# Patient Record
Sex: Male | Born: 1985 | Race: Black or African American | Hispanic: No | Marital: Single | Smoking: Former smoker
Health system: Southern US, Community
[De-identification: ages and names within clinical notes are randomized; demographics above are authoritative.]

## PROBLEM LIST (undated history)

## (undated) DIAGNOSIS — J42 Unspecified chronic bronchitis: Secondary | ICD-10-CM

## (undated) DIAGNOSIS — J449 Chronic obstructive pulmonary disease, unspecified: Secondary | ICD-10-CM

## (undated) DIAGNOSIS — J45909 Unspecified asthma, uncomplicated: Secondary | ICD-10-CM

## (undated) DIAGNOSIS — B4481 Allergic bronchopulmonary aspergillosis: Secondary | ICD-10-CM

---

## 2007-08-26 ENCOUNTER — Emergency Department: Payer: Self-pay | Admitting: Emergency Medicine

## 2016-11-15 ENCOUNTER — Emergency Department: Payer: Self-pay

## 2016-11-15 ENCOUNTER — Encounter: Payer: Self-pay | Admitting: Emergency Medicine

## 2016-11-15 DIAGNOSIS — Z87891 Personal history of nicotine dependence: Secondary | ICD-10-CM | POA: Insufficient documentation

## 2016-11-15 DIAGNOSIS — J410 Simple chronic bronchitis: Secondary | ICD-10-CM | POA: Insufficient documentation

## 2016-11-15 LAB — CBC
HCT: 48.7 % (ref 40.0–52.0)
Hemoglobin: 16.7 g/dL (ref 13.0–18.0)
MCH: 31.6 pg (ref 26.0–34.0)
MCHC: 34.4 g/dL (ref 32.0–36.0)
MCV: 92.1 fL (ref 80.0–100.0)
Platelets: 245 10*3/uL (ref 150–440)
RBC: 5.29 MIL/uL (ref 4.40–5.90)
RDW: 13.1 % (ref 11.5–14.5)
WBC: 7.7 10*3/uL (ref 3.8–10.6)

## 2016-11-15 LAB — BASIC METABOLIC PANEL
ANION GAP: 7 (ref 5–15)
BUN: 9 mg/dL (ref 6–20)
CALCIUM: 9.6 mg/dL (ref 8.9–10.3)
CO2: 30 mmol/L (ref 22–32)
Chloride: 102 mmol/L (ref 101–111)
Creatinine, Ser: 0.98 mg/dL (ref 0.61–1.24)
GLUCOSE: 114 mg/dL — AB (ref 65–99)
Potassium: 4 mmol/L (ref 3.5–5.1)
SODIUM: 139 mmol/L (ref 135–145)

## 2016-11-15 LAB — TROPONIN I

## 2016-11-15 NOTE — ED Triage Notes (Signed)
Pt. States ongoing chest pain and cough for the past month.  Pt. States increased coughing today with non-productive cough.  Pt. In no distress at this time.  Pt. States using rescue inhaler, but ineffective today.

## 2016-11-16 ENCOUNTER — Emergency Department
Admission: EM | Admit: 2016-11-16 | Discharge: 2016-11-16 | Disposition: A | Discharge status: Home / Self Care | Payer: Self-pay | Attending: Emergency Medicine | Admitting: Emergency Medicine

## 2016-11-16 DIAGNOSIS — J410 Simple chronic bronchitis: Secondary | ICD-10-CM

## 2016-11-16 MED ORDER — IPRATROPIUM-ALBUTEROL 0.5-2.5 (3) MG/3ML IN SOLN
3.0000 mL | Freq: Once | RESPIRATORY_TRACT | Status: AC
  Administered 2016-11-16: 3 mL via RESPIRATORY_TRACT
  Filled 2016-11-16: qty 3

## 2016-11-16 MED ORDER — ALBUTEROL SULFATE HFA 108 (90 BASE) MCG/ACT IN AERS: INHALATION_SPRAY | RESPIRATORY_TRACT | 1 refills | Status: DC

## 2016-11-16 NOTE — Discharge Instructions (Signed)
As we discussed, your workup today was reassuring.  Please read through the information about chronic bronchitis and use the prescribed inhaler as per the recommendations on the label.  You made a smart move by stopping smoking, and this will help you more than anything else.  Please do not go back to using tobacco!!  Please return immediately to the Emergency Department if you develop any new or worsening symptoms that concern you.

## 2016-11-16 NOTE — ED Provider Notes (Signed)
[facility] Emergency Department Provider Note  ____________________________________________   First MD Initiated Contact with Patient 11/16/16 0031     (approximate)  I have reviewed the triage vital signs and the nursing notes.   HISTORY  Chief Complaint Chest Pain (Pt. states chest pain for 3 months.) and Cough (Pt. states cough for past 3 months.)    HPI Shannon Knapp is a 31 y.o. male who is a cigarette smoker but reports that he quit a week ago and is otherwise healthy.  He presents by private vehicle for evaluation of persistent cough and central chest pain that is dull and aching when he coughs.  This is been going on for 3 months.  The coughing seems to be getting worse with some wheezing and shortness of breath particularly at night.  The frequent coughing seems to be making the aching in his chest worse.  He describes all the symptoms as moderate.  He denies fever/chills, nausea, vomiting, abdominal pain.  The shortness of breath is mild and he is still able to work out and exercise frequently and extensively.  He tried using his girlfriends albuterol inhaler at night to limited success.  Nothing in particular makes his symptoms worse.He takes no daily medications.   History reviewed. No pertinent past medical history.  There are no active problems to display for this patient.   History reviewed. No pertinent surgical history.  Prior to Admission medications   Medication Sig Start Date End Date Taking? Authorizing Provider  albuterol (PROVENTIL HFA;VENTOLIN HFA) 108 (90 Base) MCG/ACT inhaler Inhale 2-4 puffs by mouth every 4 hours as needed for wheezing, cough, and/or shortness of breath 11/16/16   Loleta RoseForbach, Melvie Paglia, MD    Allergies Penicillins  History reviewed. No pertinent family history.  Social History Social History  Substance Use Topics  . Smoking status: Former Games developermoker  . Smokeless tobacco: Never Used  . Alcohol use 9.0 oz/week      15 Cans of beer per week    Review of Systems Constitutional: No fever/chills Eyes: No visual changes. ENT: No sore throat. Cardiovascular: Central dull and aching chest pain worse with coughing 3 months Respiratory: Frequent occasionally productive cough 3 months, slightly worse recently Gastrointestinal: No abdominal pain.  No nausea, no vomiting.  No diarrhea.  No constipation. Genitourinary: Negative for dysuria. Musculoskeletal: Negative for neck pain.  Negative for back pain. Integumentary: Negative for rash. Neurological: Negative for headaches, focal weakness or numbness.   ____________________________________________   PHYSICAL EXAM:  ED Triage Vitals  Enc Vitals Group     BP 11/16/16 0106 (!) 142/83     Pulse Rate 11/16/16 0122 90     Resp 11/16/16 0122 18     Temp --      Temp src --      SpO2 11/16/16 0122 96 %     Weight --      Height --      Head Circumference --      Peak Flow --      Pain Score 11/15/16 2133 8     Pain Loc --      Pain Edu? --      Excl. in GC? --      Constitutional: Alert and oriented. Well appearing and in no acute distress. Eyes: Conjunctivae are normal.  Head: Atraumatic. Nose: No congestion/rhinnorhea. Cardiovascular: Normal rate, regular rhythm. Good peripheral circulation. Grossly normal heart sounds. Respiratory: Normal respiratory effort.  No retractions.  Moderate expiratory wheezing  throughout lung fields.  Occasional dry cough during my examination. Gastrointestinal: Soft and nontender. No distention.  Musculoskeletal: No lower extremity tenderness nor edema. No gross deformities of extremities. Neurologic:  Normal speech and language. No gross focal neurologic deficits are appreciated.  Skin:  Skin is warm, dry and intact. No rash noted. Psychiatric: Mood and affect are normal. Speech and behavior are normal.  ____________________________________________   LABS (all labs ordered are listed, but only abnormal  results are displayed)  Labs Reviewed  BASIC METABOLIC PANEL - Abnormal; Notable for the following:       Result Value   Glucose, Bld 114 (*)    All other components within normal limits  CBC  TROPONIN I   ____________________________________________  EKG  ED ECG REPORT I, Kalleigh Harbor, the attending physician, personally viewed and interpreted this ECG.  Date: 11/16/2016 EKG Time: 21:35 Rate: 80 Rhythm: normal sinus rhythm QRS Axis: normal Intervals: normal ST/T Wave abnormalities: normal Narrative Interpretation: unremarkable  ____________________________________________  RADIOLOGY   Dg Chest 2 View  Result Date: 11/15/2016 CLINICAL DATA:  Ongoing chest pain and cough for 1 month, worsened today. EXAM: CHEST  2 VIEW COMPARISON:  None. FINDINGS: The lungs are clear. The pulmonary vasculature is normal. Heart size is normal. Hilar and mediastinal contours are unremarkable. There is no pleural effusion. IMPRESSION: No active cardiopulmonary disease. Electronically Signed   By: Ellery Plunk M.D.   On: 11/15/2016 22:09    ____________________________________________   PROCEDURES  Critical Care performed: No   Procedure(s) performed:   Procedures   ____________________________________________   INITIAL IMPRESSION / ASSESSMENT AND PLAN / ED COURSE  Pertinent labs & imaging results that were available during my care of the patient were reviewed by me and considered in my medical decision making (see chart for details).  Well-appearing and healthy in general with what appears most consistent with chronic bronchitis.  His labs, vital signs, and chest x-ray, and EKG are all reassuring.  I had an extensive discussion with him about for a positive step he took by stopping smoking and how he needs to continue avoiding tobacco and order for his lungs to heal.  I am giving him a DuoNeb treatment and prescribing albuterol inhaler for his use at home to help with the  bronchospasm coughing and his wheezing.  I am providing resources that he can use to follow up and establish a primary care doctor.  I do not feel he would benefit from steroids at this point because he is not having an acute exacerbation and he agrees with this plan.  I gave my usual and customary return precautions.         ____________________________________________  FINAL CLINICAL IMPRESSION(S) / ED DIAGNOSES  Final diagnoses:  Simple chronic bronchitis (HCC)     MEDICATIONS GIVEN DURING THIS VISIT:  Medications  ipratropium-albuterol (DUONEB) 0.5-2.5 (3) MG/3ML nebulizer solution 3 mL (3 mLs Nebulization Given 11/16/16 0048)     NEW OUTPATIENT MEDICATIONS STARTED DURING THIS VISIT:  Discharge Medication List as of 11/16/2016 12:55 AM    START taking these medications   Details  albuterol (PROVENTIL HFA;VENTOLIN HFA) 108 (90 Base) MCG/ACT inhaler Inhale 2-4 puffs by mouth every 4 hours as needed for wheezing, cough, and/or shortness of breath, Print        Discharge Medication List as of 11/16/2016 12:55 AM      Discharge Medication List as of 11/16/2016 12:55 AM       Note:  This document was prepared  using Conservation officer, historic buildings and may include unintentional dictation errors.    Loleta Rose, MD 11/16/16 [phone]

## 2016-11-16 NOTE — ED Notes (Addendum)
Pt states over 3 months of cough, dry at times and productive at times with associated chest tightness and shob. Pt with dry cough noted currently. Pt states at times "i feel like i'm gonna have a heart attack, I can't breathe". Pt states he is not able to lie flat without coughing. Pt denies fever. Pt states generalized chest pain with cough for 3 months. Pt recently stopped smoking. Expiratory wheezes noted to bilateral upper lobes, clear lower lobes. Skin pwd. Resp are unlabored.

## 2017-08-29 ENCOUNTER — Encounter: Payer: Self-pay | Admitting: Emergency Medicine

## 2017-08-29 ENCOUNTER — Other Ambulatory Visit: Payer: Self-pay

## 2017-08-29 ENCOUNTER — Emergency Department
Admission: EM | Admit: 2017-08-29 | Discharge: 2017-08-29 | Disposition: A | Discharge status: Home / Self Care | Payer: Self-pay | Attending: Emergency Medicine | Admitting: Emergency Medicine

## 2017-08-29 DIAGNOSIS — Z87891 Personal history of nicotine dependence: Secondary | ICD-10-CM | POA: Insufficient documentation

## 2017-08-29 DIAGNOSIS — J0110 Acute frontal sinusitis, unspecified: Secondary | ICD-10-CM | POA: Insufficient documentation

## 2017-08-29 MED ORDER — DOXYCYCLINE HYCLATE 100 MG PO TABS: 100.0000 mg | ORAL_TABLET | Freq: Two times a day (BID) | ORAL | 0 refills | Status: AC

## 2017-08-29 NOTE — ED Provider Notes (Signed)
[facility] Emergency Department Provider Note  ____________________________________________  Time seen: Approximately 3:31 PM  I have reviewed the triage vital signs and the nursing notes.   HISTORY  Chief Complaint Facial Pain and Nasal Congestion    HPI Shannon Knapp is a 32 y.o. male presents to the emergency department with photophobia, maxillary and frontal sinus tenderness, malaise and purulent rhinorrhea for 3 weeks.  Patient reports that he has had one prior episode of sinusitis in his lifetime.  He denies fever and chills.  Patient reports that symptoms are affecting his activities of daily living.  Patient has attempted no alleviating measures.   History reviewed. No pertinent past medical history.  There are no active problems to display for this patient.   History reviewed. No pertinent surgical history.  Prior to Admission medications   Medication Sig Start Date End Date Taking? Authorizing Provider  albuterol (PROVENTIL HFA;VENTOLIN HFA) 108 (90 Base) MCG/ACT inhaler Inhale 2-4 puffs by mouth every 4 hours as needed for wheezing, cough, and/or shortness of breath 11/16/16   Loleta Rose, MD  doxycycline (VIBRA-TABS) 100 MG tablet Take 1 tablet (100 mg total) by mouth 2 (two) times daily for 7 days. 08/29/17 09/05/17  Orvil Feil, PA-C    Allergies Amoxicillin and Penicillins  No family history on file.  Social History Social History   Tobacco Use  . Smoking status: Former Games developer  . Smokeless tobacco: Never Used  Substance Use Topics  . Alcohol use: Yes    Alcohol/week: 9.0 oz    Types: 15 Cans of beer per week  . Drug use: No     Review of Systems  Constitutional: No fever/chills Eyes: No visual changes. No discharge ENT: Patient has maxillary and frontal sinus tenderness. Positive for nasal congestion.  Cardiovascular: no chest pain. Respiratory: no cough. No SOB. Gastrointestinal: No abdominal pain.  No nausea,  no vomiting.  No diarrhea.  No constipation. Musculoskeletal: Negative for musculoskeletal pain. Skin: Negative for rash, abrasions, lacerations, ecchymosis. Neurological: Negative for headaches, focal weakness or numbness.   ____________________________________________   PHYSICAL EXAM:  VITAL SIGNS: ED Triage Vitals  Enc Vitals Group     BP 08/29/17 1410 135/78     Pulse Rate 08/29/17 1410 71     Resp 08/29/17 1410 17     Temp 08/29/17 1410 99.1 F (37.3 C)     Temp Source 08/29/17 1410 Oral     SpO2 08/29/17 1410 96 %     Weight 08/29/17 1406 172 lb (78 kg)     Height 08/29/17 1406 5\' 11"  (1.803 m)     Head Circumference --      Peak Flow --      Pain Score 08/29/17 1406 7     Pain Loc --      Pain Edu? --      Excl. in GC? --      Constitutional: Alert and oriented. Well appearing and in no acute distress. Eyes: Conjunctivae are normal. PERRL. EOMI. Head: Atraumatic.  Patient has maxillary and frontal sinus tenderness. ENT:      Ears: TMs are pearly.       Nose: No congestion/rhinnorhea.      Mouth/Throat: Mucous membranes are moist.  Hematological/Lymphatic/Immunilogical: No cervical lymphadenopathy. Cardiovascular: Normal rate, regular rhythm. Normal S1 and S2.  Good peripheral circulation. Respiratory: Normal respiratory effort without tachypnea or retractions. Lungs CTAB. Good air entry to the bases with no decreased or absent breath sounds. Musculoskeletal: Full  range of motion to all extremities. No gross deformities appreciated. Neurologic:  Normal speech and language. No gross focal neurologic deficits are appreciated.  Skin:  Skin is warm, dry and intact. No rash noted.  ____________________________________________   LABS (all labs ordered are listed, but only abnormal results are displayed)  Labs Reviewed - No data to display ____________________________________________  EKG   ____________________________________________  RADIOLOGY   No  results found.  ____________________________________________    PROCEDURES  Procedure(s) performed:    Procedures    Medications - No data to display   ____________________________________________   INITIAL IMPRESSION / ASSESSMENT AND PLAN / ED COURSE  Pertinent labs & imaging results that were available during my care of the patient were reviewed by me and considered in my medical decision making (see chart for details).  Review of the [place] CSRS was performed in accordance of the [facility] prior to dispensing any controlled drugs.     Assessment and Plan:  Sinusitis: Patient presents to the emergency department with maxillary and frontal sinus tenderness, malaise and purulent rhinorrhea for the past 3 weeks.  Differential diagnosis included seasonal allergies versus sinusitis.  Patient was treated empirically with doxycycline for sinusitis.  Doxycycline was selected given amoxicillin allergy.  Vital signs are reassuring prior to discharge.  All patient questions were answered.     ____________________________________________  FINAL CLINICAL IMPRESSION(S) / ED DIAGNOSES  Final diagnoses:  Acute non-recurrent frontal sinusitis      NEW MEDICATIONS STARTED DURING THIS VISIT:  ED Discharge Orders        Ordered    doxycycline (VIBRA-TABS) 100 MG tablet  2 times daily     08/29/17 1529          This chart was dictated using voice recognition software/Dragon. Despite best efforts to proofread, errors can occur which can change the meaning. Any change was purely unintentional.    Orvil FeilWoods, Ritesh Opara M, PA-C 08/29/17 1537    Arnaldo NatalMalinda, Paul F, MD 08/29/17 2025

## 2017-08-29 NOTE — ED Triage Notes (Signed)
Pt here with sinus pressure, pain and congestion that began around 3 weeks ago. Appears in NAD.

## 2017-09-06 ENCOUNTER — Other Ambulatory Visit: Payer: Self-pay

## 2017-09-06 ENCOUNTER — Emergency Department
Admission: EM | Admit: 2017-09-06 | Discharge: 2017-09-06 | Disposition: A | Discharge status: Home / Self Care | Payer: Self-pay | Attending: Emergency Medicine | Admitting: Emergency Medicine

## 2017-09-06 DIAGNOSIS — Z5321 Procedure and treatment not carried out due to patient leaving prior to being seen by health care provider: Secondary | ICD-10-CM | POA: Insufficient documentation

## 2017-09-06 DIAGNOSIS — R51 Headache: Secondary | ICD-10-CM | POA: Insufficient documentation

## 2017-09-06 DIAGNOSIS — R1110 Vomiting, unspecified: Secondary | ICD-10-CM | POA: Insufficient documentation

## 2017-09-06 LAB — URINALYSIS, COMPLETE (UACMP) WITH MICROSCOPIC
Bilirubin Urine: NEGATIVE
Glucose, UA: NEGATIVE mg/dL
Hgb urine dipstick: NEGATIVE
KETONES UR: 5 mg/dL — AB
LEUKOCYTES UA: NEGATIVE
Nitrite: NEGATIVE
PH: 5 (ref 5.0–8.0)
PROTEIN: 30 mg/dL — AB
Specific Gravity, Urine: 1.024 (ref 1.005–1.030)

## 2017-09-06 LAB — COMPREHENSIVE METABOLIC PANEL
ALBUMIN: 4.2 g/dL (ref 3.5–5.0)
ALT: 51 U/L (ref 17–63)
AST: 43 U/L — AB (ref 15–41)
Alkaline Phosphatase: 68 U/L (ref 38–126)
Anion gap: 5 (ref 5–15)
BUN: 7 mg/dL (ref 6–20)
CHLORIDE: 103 mmol/L (ref 101–111)
CO2: 29 mmol/L (ref 22–32)
Calcium: 9.7 mg/dL (ref 8.9–10.3)
Creatinine, Ser: 0.78 mg/dL (ref 0.61–1.24)
GFR calc Af Amer: >60 mL/min (ref >60)
GLUCOSE: 104 mg/dL — AB (ref 65–99)
POTASSIUM: 4.3 mmol/L (ref 3.5–5.1)
Sodium: 137 mmol/L (ref 135–145)
Total Bilirubin: 0.9 mg/dL (ref 0.3–1.2)
Total Protein: 7.8 g/dL (ref 6.5–8.1)

## 2017-09-06 LAB — CBC
HCT: 46.9 % (ref 40.0–52.0)
Hemoglobin: 15.8 g/dL (ref 13.0–18.0)
MCH: 30.6 pg (ref 26.0–34.0)
MCHC: 33.7 g/dL (ref 32.0–36.0)
MCV: 90.7 fL (ref 80.0–100.0)
Platelets: 272 10*3/uL (ref 150–440)
RBC: 5.18 MIL/uL (ref 4.40–5.90)
RDW: 13.1 % (ref 11.5–14.5)
WBC: 11.2 10*3/uL — AB (ref 3.8–10.6)

## 2017-09-06 LAB — LIPASE, BLOOD: LIPASE: 24 U/L (ref 11–51)

## 2017-09-06 NOTE — ED Triage Notes (Signed)
Patient reports seen on 4/12 for similar symptoms.  States no better and now vomiting and states noticed blood in vomit.

## 2017-09-07 ENCOUNTER — Emergency Department (HOSPITAL_COMMUNITY)
Admission: EM | Admit: 2017-09-07 | Discharge: 2017-09-07 | Discharge status: Against Medical Advice | Payer: Self-pay | Attending: Emergency Medicine | Admitting: Emergency Medicine

## 2017-09-07 ENCOUNTER — Encounter (HOSPITAL_COMMUNITY): Payer: Self-pay | Admitting: *Deleted

## 2017-09-07 ENCOUNTER — Emergency Department (HOSPITAL_COMMUNITY)
Admission: EM | Admit: 2017-09-07 | Discharge: 2017-09-08 | Disposition: A | Discharge status: Home / Self Care | Payer: Self-pay | Attending: Emergency Medicine | Admitting: Emergency Medicine

## 2017-09-07 ENCOUNTER — Emergency Department (HOSPITAL_COMMUNITY): Payer: Self-pay

## 2017-09-07 ENCOUNTER — Other Ambulatory Visit: Payer: Self-pay

## 2017-09-07 ENCOUNTER — Encounter (HOSPITAL_COMMUNITY): Payer: Self-pay

## 2017-09-07 DIAGNOSIS — Z5321 Procedure and treatment not carried out due to patient leaving prior to being seen by health care provider: Secondary | ICD-10-CM | POA: Insufficient documentation

## 2017-09-07 DIAGNOSIS — J0110 Acute frontal sinusitis, unspecified: Secondary | ICD-10-CM | POA: Insufficient documentation

## 2017-09-07 DIAGNOSIS — J449 Chronic obstructive pulmonary disease, unspecified: Secondary | ICD-10-CM | POA: Insufficient documentation

## 2017-09-07 DIAGNOSIS — Z9109 Other allergy status, other than to drugs and biological substances: Secondary | ICD-10-CM | POA: Insufficient documentation

## 2017-09-07 DIAGNOSIS — J40 Bronchitis, not specified as acute or chronic: Secondary | ICD-10-CM | POA: Insufficient documentation

## 2017-09-07 DIAGNOSIS — Z87891 Personal history of nicotine dependence: Secondary | ICD-10-CM | POA: Insufficient documentation

## 2017-09-07 HISTORY — DX: Unspecified chronic bronchitis: J42

## 2017-09-07 HISTORY — DX: Chronic obstructive pulmonary disease, unspecified: J44.9

## 2017-09-07 LAB — BASIC METABOLIC PANEL
ANION GAP: 11 (ref 5–15)
BUN: 7 mg/dL (ref 6–20)
CO2: 23 mmol/L (ref 22–32)
Calcium: 9.2 mg/dL (ref 8.9–10.3)
Chloride: 104 mmol/L (ref 101–111)
Creatinine, Ser: 0.91 mg/dL (ref 0.61–1.24)
GFR calc Af Amer: >60 mL/min (ref >60)
GLUCOSE: 102 mg/dL — AB (ref 65–99)
POTASSIUM: 3.8 mmol/L (ref 3.5–5.1)
SODIUM: 138 mmol/L (ref 135–145)

## 2017-09-07 LAB — CBC
HCT: 42.8 % (ref 39.0–52.0)
HEMOGLOBIN: 14.8 g/dL (ref 13.0–17.0)
MCH: 30.6 pg (ref 26.0–34.0)
MCHC: 34.6 g/dL (ref 30.0–36.0)
MCV: 88.6 fL (ref 78.0–100.0)
Platelets: 272 10*3/uL (ref 150–400)
RBC: 4.83 MIL/uL (ref 4.22–5.81)
RDW: 13.2 % (ref 11.5–15.5)
WBC: 11.1 10*3/uL — AB (ref 4.0–10.5)

## 2017-09-07 NOTE — ED Triage Notes (Signed)
Patient states he has been having sinus pain and congestion for the past month associated with migraine headaches-patient states he has COPD and chronic bronchitis and states he has coughed up some blood yesterday-states some wheezing. Patient states he is using his son's albuterol inhaler. Patient states he was just seen at [facility] and was given doxycycline on the 12th and states has not helped. Patient states using Claritin with no relief

## 2017-09-07 NOTE — ED Notes (Signed)
Pt called from the lobby with no response x3 

## 2017-09-07 NOTE — ED Triage Notes (Signed)
Pt arrives via EMS. Pt dx with COPD/chronic bronchitis. Over the past month c/o increased SOB.  Room air 92%, 98% with nebs. Pt with increased SOB over several days. Pt denies inhalers/nebs at home.  On arrival to scene by EMS, the patient was diminished throughout and diaphoretic. He has rec'd 15mg  albuterol/ 1mg  atrovent & 125 solumedrol en route, now with wheezing throughout.

## 2017-09-08 MED ORDER — IPRATROPIUM BROMIDE 0.02 % IN SOLN
0.5000 mg | Freq: Once | RESPIRATORY_TRACT | Status: AC
  Administered 2017-09-08: 0.5 mg via RESPIRATORY_TRACT
  Filled 2017-09-08: qty 2.5

## 2017-09-08 MED ORDER — ALBUTEROL SULFATE (2.5 MG/3ML) 0.083% IN NEBU: 5.0000 mg | INHALATION_SOLUTION | Freq: Four times a day (QID) | RESPIRATORY_TRACT | 12 refills | Status: DC | PRN

## 2017-09-08 MED ORDER — FLUTICASONE PROPIONATE 50 MCG/ACT NA SUSP: 2.0000 | Freq: Every day | NASAL | 0 refills | Status: DC

## 2017-09-08 MED ORDER — ALBUTEROL SULFATE (2.5 MG/3ML) 0.083% IN NEBU
5.0000 mg | INHALATION_SOLUTION | Freq: Once | RESPIRATORY_TRACT | Status: AC
  Administered 2017-09-08: 5 mg via RESPIRATORY_TRACT
  Filled 2017-09-08: qty 6

## 2017-09-08 MED ORDER — LORATADINE 10 MG PO TABS: 10.0000 mg | ORAL_TABLET | Freq: Every day | ORAL | 0 refills | Status: DC

## 2017-09-08 MED ORDER — PREDNISONE 20 MG PO TABS: ORAL_TABLET | ORAL | 0 refills | Status: DC

## 2017-09-08 MED ORDER — IPRATROPIUM-ALBUTEROL 0.5-2.5 (3) MG/3ML IN SOLN
3.0000 mL | Freq: Once | RESPIRATORY_TRACT | Status: DC
  Filled 2017-09-08: qty 3

## 2017-09-08 MED ORDER — IPRATROPIUM-ALBUTEROL 0.5-2.5 (3) MG/3ML IN SOLN
3.0000 mL | Freq: Once | RESPIRATORY_TRACT | Status: DC
Start: 2017-09-08 — End: 2017-09-08

## 2017-09-08 MED ORDER — ALBUTEROL SULFATE HFA 108 (90 BASE) MCG/ACT IN AERS
2.0000 | INHALATION_SPRAY | RESPIRATORY_TRACT | Status: DC | PRN
  Filled 2017-09-08: qty 6.7

## 2017-09-08 MED ORDER — ALBUTEROL (5 MG/ML) CONTINUOUS INHALATION SOLN
10.0000 mg/h | INHALATION_SOLUTION | Freq: Once | RESPIRATORY_TRACT | Status: AC
  Administered 2017-09-08: 10 mg/h via RESPIRATORY_TRACT
  Filled 2017-09-08: qty 20

## 2017-09-08 MED ORDER — BENZONATATE 100 MG PO CAPS
100.0000 mg | ORAL_CAPSULE | Freq: Once | ORAL | Status: AC
  Administered 2017-09-08: 100 mg via ORAL
  Filled 2017-09-08: qty 1

## 2017-09-08 NOTE — ED Provider Notes (Signed)
[facility] EMERGENCY DEPARTMENT Provider Note   CSN: [id number] Arrival date & time: 09/07/17  1938     History   Chief Complaint Chief Complaint  Patient presents with  . Shortness of Breath    HPI Shannon Knapp is a 32 y.o. male.  Patient presents to the ED with complaint of cough for 3 weeks. He reports a history of chronic bronchitis. No history of asthma. Patient is a nonsmoker. He has had sinus pressure and nasal congestion for which he is taking Claritin and Zyrtec. Recently prescribed Doxycycline x 7 days and states symptoms did not change. His cough has gotten worse and he describes coughing fits that make it difficult for him to breathe.   The history is provided by the patient. No language interpreter was used.    Past Medical History:  Diagnosis Date  . Chronic bronchitis (HCC)   . COPD (chronic obstructive pulmonary disease) (HCC)     There are no active problems to display for this patient.   History reviewed. No pertinent surgical history.      Home Medications    Prior to Admission medications   Medication Sig Start Date End Date Taking? Authorizing Provider  albuterol (PROVENTIL HFA;VENTOLIN HFA) 108 (90 Base) MCG/ACT inhaler Inhale 2-4 puffs by mouth every 4 hours as needed for wheezing, cough, and/or shortness of breath Patient not taking: Reported on 09/08/2017 11/16/16   Loleta Rose, MD    Family History No family history on file.  Social History Social History   Tobacco Use  . Smoking status: Former Games developer  . Smokeless tobacco: Never Used  Substance Use Topics  . Alcohol use: Yes    Alcohol/week: 9.0 oz    Types: 15 Cans of beer per week  . Drug use: No     Allergies   Amoxicillin and Penicillins   Review of Systems Review of Systems  Constitutional: Negative for chills and fever.  HENT: Positive for congestion, rhinorrhea, sinus pressure and sinus pain.   Respiratory: Positive for cough, shortness  of breath and wheezing.   Cardiovascular: Positive for chest pain (with coughing).  Gastrointestinal: Negative.  Negative for abdominal pain and vomiting.  Musculoskeletal: Negative.   Skin: Negative.   Neurological: Negative.      Physical Exam Updated Vital Signs BP (!) 141/89   Pulse 90   Temp 98.7 F (37.1 C) (Oral)   Resp 14   Ht 5\' 11"  (1.803 m)   Wt 77.6 kg (171 lb)   SpO2 94%   BMI 23.85 kg/m   Physical Exam  Constitutional: He is oriented to person, place, and time. He appears well-developed and well-nourished.  HENT:  Head: Normocephalic.  Nose: Mucosal edema present. Right sinus exhibits no frontal sinus tenderness. Left sinus exhibits no frontal sinus tenderness.  Neck: Normal range of motion. Neck supple.  Cardiovascular: Regular rhythm. Tachycardia present.  Pulmonary/Chest: Effort normal and breath sounds normal. No respiratory distress. He has no wheezes.  Prolonged expirations.   Abdominal: Soft. Bowel sounds are normal. There is no tenderness. There is no rebound and no guarding.  Musculoskeletal: Normal range of motion.  Neurological: He is alert and oriented to person, place, and time.  Skin: Skin is warm and dry. No rash noted.  Psychiatric: He has a normal mood and affect.     ED Treatments / Results  Labs (all labs ordered are listed, but only abnormal results are displayed) Labs Reviewed  BASIC METABOLIC PANEL - Abnormal; Notable  for the following components:      Result Value   Glucose, Bld 102 (*)    All other components within normal limits  CBC - Abnormal; Notable for the following components:   WBC 11.1 (*)    All other components within normal limits    EKG EKG Interpretation  Date/Time:  Sunday September 07 2017 19:43:39 EDT Ventricular Rate:  92 PR Interval:  144 QRS Duration: 84 QT Interval:  354 QTC Calculation: 437 R Axis:   89 Text Interpretation:  Normal sinus rhythm with sinus arrhythmia Normal ECG No acute changes  Nonspecific ST and T wave abnormality No significant change since last tracing Confirmed by Derwood KaplanNanavati, Ankit [phone]([id number]) on 09/08/2017 4:11:02 AM   Radiology Dg Chest 2 View  Result Date: 09/07/2017 CLINICAL DATA:  Cough, shortness of breath EXAM: CHEST - 2 VIEW COMPARISON:  None. FINDINGS: Heart and mediastinal contours are within normal limits. No focal opacities or effusions. No acute bony abnormality. IMPRESSION: No active cardiopulmonary disease. Electronically Signed   By: Charlett NoseKevin  Dover M.D.   On: 09/07/2017 20:22    Procedures Procedures (including critical care time)  Medications Ordered in ED Medications  ipratropium-albuterol (DUONEB) 0.5-2.5 (3) MG/3ML nebulizer solution 3 mL (has no administration in time range)  albuterol (PROVENTIL) (2.5 MG/3ML) 0.083% nebulizer solution 5 mg (has no administration in time range)  ipratropium (ATROVENT) nebulizer solution 0.5 mg (has no administration in time range)  benzonatate (TESSALON) capsule 100 mg (has no administration in time range)     Initial Impression / Assessment and Plan / ED Course  I have reviewed the triage vital signs and the nursing notes.  Pertinent labs & imaging results that were available during my care of the patient were reviewed by me and considered in my medical decision making (see chart for details).     Patient presents with cough, congestion, sinus pressure and SOB. Cough x 3 weeks.   The patient is having cough and SOB, sinus congestion for 3 weeks. Antibiotic course completed without change to symptoms. Suspect environmental allergy on chronic bronchitis.   He has been given steroids in route and a duoneb x1. Additional albuterol/atrovent neb provided. He is seen and evaluated by Dr. Rhunette CroftNanavati. An hour-long neb ordered.   Plan: anticipate discharge home with medication for nebulizer; Flonase nasal spray; prednisone; encourage him to continue either Claritin or Zyrtec; and referral for primary care.   Patient  is feeling better, coughing is improved. CXR clear. He can be discharged home. Return precautions discussed.     Final Clinical Impressions(s) / ED Diagnoses   Final diagnoses:  None   1. Bronchitis 2. Sinusitis 3. Environmental allergies  ED Discharge Orders    None       Elpidio AnisUpstill, Kaelea Gathright, PA-C 09/08/17 0750    Derwood KaplanNanavati, Ankit, MD 09/09/17 [phone]

## 2017-09-08 NOTE — ED Notes (Signed)
PIV removed. Went over d/c instructions with the patient. Vitals stable. Pt verbalized understanding of discharge instructions.

## 2017-09-08 NOTE — ED Notes (Signed)
Pt ambulated independently w/ a steady gait.  Pt's O2 sat dropped to 89% on RA upon exiting his room.  Pt ambulated around nursing station O2 sat improved within 5 steps to 93%.  Thereafter pt's O2 sat remained between 93-96% on RA.  Melvenia BeamShari, PA is aware. Pt denied feeling shob w/ ambulation.

## 2017-10-17 ENCOUNTER — Emergency Department (HOSPITAL_COMMUNITY): Payer: Self-pay

## 2017-10-17 ENCOUNTER — Encounter: Payer: Self-pay | Admitting: Pulmonary Disease

## 2017-10-17 ENCOUNTER — Inpatient Hospital Stay (HOSPITAL_COMMUNITY)
Admission: EM | Admit: 2017-10-17 | Discharge: 2017-10-30 | DRG: 202 | Disposition: A | Discharge status: Home / Self Care | Payer: Self-pay | Attending: Internal Medicine | Admitting: Internal Medicine

## 2017-10-17 ENCOUNTER — Ambulatory Visit (INDEPENDENT_AMBULATORY_CARE_PROVIDER_SITE_OTHER): Payer: Self-pay | Admitting: Pulmonary Disease

## 2017-10-17 ENCOUNTER — Encounter (HOSPITAL_COMMUNITY): Payer: Self-pay

## 2017-10-17 ENCOUNTER — Other Ambulatory Visit: Payer: Self-pay

## 2017-10-17 VITALS — BP 130/90 | HR 86 | Ht 71.0 in | Wt 163.2 lb

## 2017-10-17 DIAGNOSIS — J4551 Severe persistent asthma with (acute) exacerbation: Principal | ICD-10-CM | POA: Diagnosis present

## 2017-10-17 DIAGNOSIS — R55 Syncope and collapse: Secondary | ICD-10-CM

## 2017-10-17 DIAGNOSIS — K2100 Gastro-esophageal reflux disease with esophagitis, without bleeding: Secondary | ICD-10-CM

## 2017-10-17 DIAGNOSIS — Z825 Family history of asthma and other chronic lower respiratory diseases: Secondary | ICD-10-CM

## 2017-10-17 DIAGNOSIS — Z8249 Family history of ischemic heart disease and other diseases of the circulatory system: Secondary | ICD-10-CM

## 2017-10-17 DIAGNOSIS — R05 Cough: Secondary | ICD-10-CM

## 2017-10-17 DIAGNOSIS — J9601 Acute respiratory failure with hypoxia: Secondary | ICD-10-CM | POA: Diagnosis present

## 2017-10-17 DIAGNOSIS — J209 Acute bronchitis, unspecified: Secondary | ICD-10-CM | POA: Diagnosis present

## 2017-10-17 DIAGNOSIS — Z7951 Long term (current) use of inhaled steroids: Secondary | ICD-10-CM

## 2017-10-17 DIAGNOSIS — R059 Cough, unspecified: Secondary | ICD-10-CM

## 2017-10-17 DIAGNOSIS — R030 Elevated blood-pressure reading, without diagnosis of hypertension: Secondary | ICD-10-CM | POA: Diagnosis present

## 2017-10-17 DIAGNOSIS — R058 Other specified cough: Secondary | ICD-10-CM

## 2017-10-17 DIAGNOSIS — J309 Allergic rhinitis, unspecified: Secondary | ICD-10-CM

## 2017-10-17 DIAGNOSIS — T380X5A Adverse effect of glucocorticoids and synthetic analogues, initial encounter: Secondary | ICD-10-CM | POA: Diagnosis present

## 2017-10-17 DIAGNOSIS — J4550 Severe persistent asthma, uncomplicated: Secondary | ICD-10-CM | POA: Diagnosis present

## 2017-10-17 DIAGNOSIS — R0602 Shortness of breath: Secondary | ICD-10-CM

## 2017-10-17 DIAGNOSIS — J449 Chronic obstructive pulmonary disease, unspecified: Secondary | ICD-10-CM | POA: Diagnosis present

## 2017-10-17 DIAGNOSIS — J45901 Unspecified asthma with (acute) exacerbation: Secondary | ICD-10-CM

## 2017-10-17 DIAGNOSIS — R0902 Hypoxemia: Secondary | ICD-10-CM

## 2017-10-17 DIAGNOSIS — R000 Tachycardia, unspecified: Secondary | ICD-10-CM

## 2017-10-17 DIAGNOSIS — Z8709 Personal history of other diseases of the respiratory system: Secondary | ICD-10-CM

## 2017-10-17 DIAGNOSIS — B370 Candidal stomatitis: Secondary | ICD-10-CM | POA: Diagnosis present

## 2017-10-17 DIAGNOSIS — R739 Hyperglycemia, unspecified: Secondary | ICD-10-CM | POA: Diagnosis present

## 2017-10-17 DIAGNOSIS — R042 Hemoptysis: Secondary | ICD-10-CM

## 2017-10-17 DIAGNOSIS — J45902 Unspecified asthma with status asthmaticus: Secondary | ICD-10-CM | POA: Diagnosis present

## 2017-10-17 DIAGNOSIS — I10 Essential (primary) hypertension: Secondary | ICD-10-CM

## 2017-10-17 DIAGNOSIS — E872 Acidosis: Secondary | ICD-10-CM | POA: Diagnosis present

## 2017-10-17 DIAGNOSIS — E43 Unspecified severe protein-calorie malnutrition: Secondary | ICD-10-CM | POA: Diagnosis present

## 2017-10-17 DIAGNOSIS — R0603 Acute respiratory distress: Secondary | ICD-10-CM | POA: Diagnosis present

## 2017-10-17 DIAGNOSIS — J441 Chronic obstructive pulmonary disease with (acute) exacerbation: Secondary | ICD-10-CM

## 2017-10-17 DIAGNOSIS — Z23 Encounter for immunization: Secondary | ICD-10-CM

## 2017-10-17 DIAGNOSIS — J4552 Severe persistent asthma with status asthmaticus: Secondary | ICD-10-CM | POA: Diagnosis present

## 2017-10-17 DIAGNOSIS — Z87891 Personal history of nicotine dependence: Secondary | ICD-10-CM

## 2017-10-17 HISTORY — DX: Unspecified asthma, uncomplicated: J45.909

## 2017-10-17 LAB — CREATININE, SERUM: CREATININE: 0.98 mg/dL (ref 0.61–1.24)

## 2017-10-17 LAB — BASIC METABOLIC PANEL
ANION GAP: 10 (ref 5–15)
CHLORIDE: 103 mmol/L (ref 101–111)
CO2: 27 mmol/L (ref 22–32)
Calcium: 9.6 mg/dL (ref 8.9–10.3)
Creatinine, Ser: 0.89 mg/dL (ref 0.61–1.24)
GFR calc Af Amer: >60 mL/min (ref >60)
GLUCOSE: 95 mg/dL (ref 65–99)
POTASSIUM: 3.7 mmol/L (ref 3.5–5.1)
Sodium: 140 mmol/L (ref 135–145)

## 2017-10-17 LAB — CBC WITH DIFFERENTIAL/PLATELET
ABS IMMATURE GRANULOCYTES: 0 10*3/uL (ref 0.0–0.1)
Basophils Absolute: 0 10*3/uL (ref 0.0–0.1)
Basophils Relative: 1 %
Eosinophils Absolute: 0.2 10*3/uL (ref 0.0–0.7)
Eosinophils Relative: 3 %
HEMATOCRIT: 44.9 % (ref 39.0–52.0)
Hemoglobin: 15 g/dL (ref 13.0–17.0)
IMMATURE GRANULOCYTES: 1 %
LYMPHS ABS: 1.2 10*3/uL (ref 0.7–4.0)
Lymphocytes Relative: 16 %
MCH: 30.2 pg (ref 26.0–34.0)
MCHC: 33.4 g/dL (ref 30.0–36.0)
MCV: 90.5 fL (ref 78.0–100.0)
MONO ABS: 0.2 10*3/uL (ref 0.1–1.0)
MONOS PCT: 3 %
NEUTROS ABS: 5.9 10*3/uL (ref 1.7–7.7)
NEUTROS PCT: 78 %
PLATELETS: 267 10*3/uL (ref 150–400)
RBC: 4.96 MIL/uL (ref 4.22–5.81)
RDW: 13.2 % (ref 11.5–15.5)
WBC: 7.6 10*3/uL (ref 4.0–10.5)

## 2017-10-17 LAB — CBG MONITORING, ED: GLUCOSE-CAPILLARY: 97 mg/dL (ref 65–99)

## 2017-10-17 LAB — CBC
HEMATOCRIT: 45.7 % (ref 39.0–52.0)
HEMOGLOBIN: 15.4 g/dL (ref 13.0–17.0)
MCH: 30.4 pg (ref 26.0–34.0)
MCHC: 33.7 g/dL (ref 30.0–36.0)
MCV: 90.3 fL (ref 78.0–100.0)
Platelets: 302 10*3/uL (ref 150–400)
RBC: 5.06 MIL/uL (ref 4.22–5.81)
RDW: 13.3 % (ref 11.5–15.5)
WBC: 7.9 10*3/uL (ref 4.0–10.5)

## 2017-10-17 LAB — NITRIC OXIDE: NITRIC OXIDE: 95

## 2017-10-17 LAB — TSH: TSH: 0.359 u[IU]/mL (ref 0.350–4.500)

## 2017-10-17 LAB — GLUCOSE, CAPILLARY: Glucose-Capillary: 169 mg/dL — ABNORMAL HIGH (ref 65–99)

## 2017-10-17 MED ORDER — PREDNISONE 10 MG PO TABS: ORAL_TABLET | ORAL | 0 refills | Status: DC

## 2017-10-17 MED ORDER — DM-GUAIFENESIN ER 30-600 MG PO TB12
1.0000 | ORAL_TABLET | Freq: Two times a day (BID) | ORAL | Status: DC
  Administered 2017-10-17 – 2017-10-30 (×26): 1 via ORAL
  Filled 2017-10-17 (×26): qty 1

## 2017-10-17 MED ORDER — LORATADINE 10 MG PO TABS: 10.0000 mg | ORAL_TABLET | Freq: Every day | ORAL | Status: DC

## 2017-10-17 MED ORDER — ACETAMINOPHEN 325 MG PO TABS: 650.0000 mg | ORAL_TABLET | Freq: Four times a day (QID) | ORAL | Status: DC | PRN

## 2017-10-17 MED ORDER — FLUTICASONE FUROATE-VILANTEROL 200-25 MCG/INH IN AEPB
1.0000 | INHALATION_SPRAY | Freq: Every day | RESPIRATORY_TRACT | Status: DC
  Administered 2017-10-18: 1 via RESPIRATORY_TRACT
  Filled 2017-10-17: qty 28

## 2017-10-17 MED ORDER — IPRATROPIUM BROMIDE 0.02 % IN SOLN
1.0000 mg | Freq: Once | RESPIRATORY_TRACT | Status: AC
  Administered 2017-10-17: 1 mg via RESPIRATORY_TRACT
  Filled 2017-10-17: qty 5

## 2017-10-17 MED ORDER — SODIUM CHLORIDE 0.9% FLUSH
3.0000 mL | Freq: Two times a day (BID) | INTRAVENOUS | Status: DC
  Administered 2017-10-17 – 2017-10-29 (×20): 3 mL via INTRAVENOUS

## 2017-10-17 MED ORDER — ONDANSETRON HCL 4 MG/2ML IJ SOLN: 4.0000 mg | Freq: Four times a day (QID) | INTRAMUSCULAR | Status: DC | PRN

## 2017-10-17 MED ORDER — FLUTICASONE FUROATE-VILANTEROL 200-25 MCG/INH IN AEPB: 1.0000 | INHALATION_SPRAY | Freq: Every day | RESPIRATORY_TRACT | 5 refills | Status: DC

## 2017-10-17 MED ORDER — MAGNESIUM SULFATE 2 GM/50ML IV SOLN
2.0000 g | Freq: Once | INTRAVENOUS | Status: AC
  Administered 2017-10-17: 2 g via INTRAVENOUS
  Filled 2017-10-17: qty 50

## 2017-10-17 MED ORDER — LEVALBUTEROL HCL 0.63 MG/3ML IN NEBU: 0.6300 mg | INHALATION_SOLUTION | RESPIRATORY_TRACT | Status: DC | PRN

## 2017-10-17 MED ORDER — IPRATROPIUM-ALBUTEROL 0.5-2.5 (3) MG/3ML IN SOLN
3.0000 mL | Freq: Once | RESPIRATORY_TRACT | Status: AC
  Administered 2017-10-17: 3 mL via RESPIRATORY_TRACT
  Filled 2017-10-17: qty 3

## 2017-10-17 MED ORDER — METHYLPREDNISOLONE SODIUM SUCC 125 MG IJ SOLR
60.0000 mg | Freq: Four times a day (QID) | INTRAMUSCULAR | Status: DC
  Administered 2017-10-17 – 2017-10-18 (×3): 60 mg via INTRAVENOUS
  Filled 2017-10-17 (×3): qty 2

## 2017-10-17 MED ORDER — FLUTICASONE PROPIONATE 50 MCG/ACT NA SUSP: 2.0000 | Freq: Every day | NASAL | Status: DC

## 2017-10-17 MED ORDER — FLUTICASONE FUROATE-VILANTEROL 200-25 MCG/INH IN AEPB: 1.0000 | INHALATION_SPRAY | Freq: Every day | RESPIRATORY_TRACT | 0 refills | Status: DC

## 2017-10-17 MED ORDER — IPRATROPIUM-ALBUTEROL 0.5-2.5 (3) MG/3ML IN SOLN
3.0000 mL | Freq: Once | RESPIRATORY_TRACT | Status: AC
  Administered 2017-10-17: 3 mL via RESPIRATORY_TRACT

## 2017-10-17 MED ORDER — LEVOFLOXACIN IN D5W 750 MG/150ML IV SOLN
750.0000 mg | INTRAVENOUS | Status: DC
  Administered 2017-10-17: 750 mg via INTRAVENOUS
  Filled 2017-10-17: qty 150

## 2017-10-17 MED ORDER — ALBUTEROL (5 MG/ML) CONTINUOUS INHALATION SOLN
20.0000 mg/h | INHALATION_SOLUTION | Freq: Once | RESPIRATORY_TRACT | Status: AC
  Administered 2017-10-17: 20 mg/h via RESPIRATORY_TRACT

## 2017-10-17 MED ORDER — ONDANSETRON HCL 4 MG PO TABS: 4.0000 mg | ORAL_TABLET | Freq: Four times a day (QID) | ORAL | Status: DC | PRN

## 2017-10-17 MED ORDER — INSULIN ASPART 100 UNIT/ML ~~LOC~~ SOLN
0.0000 [IU] | Freq: Three times a day (TID) | SUBCUTANEOUS | Status: DC
  Administered 2017-10-18: 1 [IU] via SUBCUTANEOUS

## 2017-10-17 MED ORDER — ACETAMINOPHEN 650 MG RE SUPP: 650.0000 mg | Freq: Four times a day (QID) | RECTAL | Status: DC | PRN

## 2017-10-17 MED ORDER — TRAMADOL HCL 50 MG PO TABS
100.0000 mg | ORAL_TABLET | Freq: Four times a day (QID) | ORAL | Status: DC
  Administered 2017-10-17 – 2017-10-30 (×49): 100 mg via ORAL
  Filled 2017-10-17 (×51): qty 2

## 2017-10-17 MED ORDER — ALBUTEROL (5 MG/ML) CONTINUOUS INHALATION SOLN
10.0000 mg/h | INHALATION_SOLUTION | Freq: Once | RESPIRATORY_TRACT | Status: AC
  Administered 2017-10-17: 10 mg/h via RESPIRATORY_TRACT
  Filled 2017-10-17: qty 20

## 2017-10-17 MED ORDER — METHYLPREDNISOLONE SODIUM SUCC 125 MG IJ SOLR
125.0000 mg | Freq: Once | INTRAMUSCULAR | Status: AC
  Administered 2017-10-17: 125 mg via INTRAVENOUS
  Filled 2017-10-17: qty 2

## 2017-10-17 MED ORDER — SODIUM CHLORIDE 0.9 % IV SOLN: 250.0000 mL | INTRAVENOUS | Status: DC | PRN

## 2017-10-17 MED ORDER — METHYLPREDNISOLONE ACETATE 80 MG/ML IJ SUSP
80.0000 mg | Freq: Once | INTRAMUSCULAR | Status: AC
  Administered 2017-10-17: 80 mg via INTRAMUSCULAR

## 2017-10-17 MED ORDER — SODIUM CHLORIDE 0.9 % IV BOLUS
1000.0000 mL | Freq: Once | INTRAVENOUS | Status: AC
  Administered 2017-10-17: 1000 mL via INTRAVENOUS

## 2017-10-17 MED ORDER — TRAMADOL HCL 50 MG PO TABS: 50.0000 mg | ORAL_TABLET | Freq: Four times a day (QID) | ORAL | Status: DC | PRN

## 2017-10-17 MED ORDER — ALBUTEROL SULFATE (2.5 MG/3ML) 0.083% IN NEBU
5.0000 mg | INHALATION_SOLUTION | Freq: Once | RESPIRATORY_TRACT | Status: DC
  Filled 2017-10-17: qty 6

## 2017-10-17 MED ORDER — SODIUM CHLORIDE 0.9% FLUSH: 3.0000 mL | INTRAVENOUS | Status: DC | PRN

## 2017-10-17 MED ORDER — ALBUTEROL SULFATE (2.5 MG/3ML) 0.083% IN NEBU
5.0000 mg | INHALATION_SOLUTION | RESPIRATORY_TRACT | Status: DC | PRN
  Filled 2017-10-17: qty 6

## 2017-10-17 MED ORDER — SODIUM CHLORIDE 0.9% FLUSH
3.0000 mL | Freq: Two times a day (BID) | INTRAVENOUS | Status: DC
  Administered 2017-10-18 – 2017-10-22 (×5): 3 mL via INTRAVENOUS

## 2017-10-17 MED ORDER — ENOXAPARIN SODIUM 40 MG/0.4ML ~~LOC~~ SOLN
40.0000 mg | SUBCUTANEOUS | Status: DC
  Administered 2017-10-17 – 2017-10-21 (×5): 40 mg via SUBCUTANEOUS
  Filled 2017-10-17 (×5): qty 0.4

## 2017-10-17 MED ORDER — POTASSIUM CHLORIDE IN NACL 20-0.9 MEQ/L-% IV SOLN
INTRAVENOUS | Status: AC
  Administered 2017-10-17 – 2017-10-18 (×3): via INTRAVENOUS
  Filled 2017-10-17 (×3): qty 1000

## 2017-10-17 NOTE — ED Notes (Signed)
Per Pt request Pt is sitting in a chair. Pt doesn't fel like he can breath in recliner.

## 2017-10-17 NOTE — ED Provider Notes (Signed)
Patient placed in Quick Look pathway, seen and evaluated   Chief Complaint: Shortness of breath  HPI: 32 year old with history of chronic bronchitis COPD presents for ongoing and worsening shortness of breath for 3 days.  He notes dyspnea on exertion, chest tightness and cough productive of white mucus with intermittent specks of blood and yellow sputum.  He was seen and evaluated by his pulmonologist earlier today and was given Depo 80 mg injection and nebulizers in the office.  Due to his increased work of breathing the pulmonologist recommended presentation to the ED for further evaluation.  ROS: Positive for shortness of breath, wheezing, cough, chest tightness negative for fevers.  Physical Exam:   Gen: No distress  Neuro: Awake and Alert  Skin: Warm    Focused Exam: Audible expiratory wheezing, diffuse wheezing on auscultation of the lungs.  Equal rise and fall of chest.  Patient is tachypneic with accessory muscle use.  Speaking in very short phrases.   Initiation of care has begun. The patient has been counseled on the process, plan, and necessity for staying for the completion/evaluation, and the remainder of the medical screening examination    Jeanie Sewer, PA-C 10/17/17 1418    Jacalyn Lefevre, MD 10/17/17 1459

## 2017-10-17 NOTE — ED Notes (Signed)
The pt appears  To be more comfortable  At present no audible wheezes  He reports that he is ok

## 2017-10-17 NOTE — H&P (Signed)
History and Physical    Shannon Knapp ZOX:[id number] DOB: 24-May-1985 DOA: 10/17/2017  PCP: Patient, No Pcp Per  Patient coming from: [facility] office  I have personally reviewed patient's old medical records in [facility]  Chief Complaint: Shortness of breath and dyspnea  HPI: Shannon Knapp is a 32 y.o. male with medical history significant of chronic bronchitis and COPD who was sent over from about her pulmonary's office this afternoon.  He was given a putative diagnosis of COPD last year.  He has had multiple ED visits including [facility], [facility].  He has a history of cough and dyspnea and wheezing when he presents the emergency department.  At the last visit he was started on steroids and nebulizers and x-ray was clear and he was prescribed inhalers but he cannot afford them due to his uninsured status.  He came into clinic with 3 days of dyspnea both at rest and with activity.  Is associated with cough with white mucus, and wheezing.  He had a few specks of blood when he coughed up this morning.  He has no prior history of pets no birds no farm animals, he works as a Adult nurse for [employer], he has no known exposures, no mold, no hot tubs.  He has a 5-pack-year smoking history he quit in March 2019 he smokes marijuana occasionally.  He lived in [place] for 10 years but no other travel.  He has a family history significant that his father was diagnosed with asthma.   ED Course: Continuous nebulizer with Solu-Medrol as well as albuterol, remained in respiratory distress with persistent wheezing.  IV magnesium given an additional nebulizer of albuterol 20 and Atrovent.  Continued to have wheezing after that.  Improvement in his air exchange.  Operatory distress had decreased somewhat however patient still too ill to go home was referred to me for further evaluation and management.  He currently denies nausea, vomiting,  diarrhea, constipation, headache, blurry vision, rashes, masses, fever, chills, chest pain, or palpitations.  Review of Systems: As per HPI otherwise all other systems reviewed and  negative.    Past Medical History:  Diagnosis Date  . Chronic bronchitis (HCC)   . COPD (chronic obstructive pulmonary disease) (HCC)     History reviewed. No pertinent surgical history.  Social History   Social History Narrative  . Not on file     reports that he has quit smoking. He has never used smokeless tobacco. He reports that he drinks about 9.0 oz of alcohol per week. He reports that he does not use drugs.  Allergies  Allergen Reactions  . Amoxicillin Hives  . Penicillins Itching    Family History  Problem Relation Age of Onset  . Hypertension Mother   . Asthma Father   . Hypertension Father     Prior to Admission medications   Medication Sig Start Date End Date Taking? Authorizing Provider  albuterol (PROVENTIL) (2.5 MG/3ML) 0.083% nebulizer solution Take 6 mLs (5 mg total) by nebulization every 6 (six) hours as needed for wheezing or shortness of breath. 09/08/17   Upstill, Melvenia Beam, PA-C  fluticasone (FLONASE) 50 MCG/ACT nasal spray Place 2 sprays into both nostrils daily. 09/08/17   Elpidio Anis, PA-C  fluticasone furoate-vilanterol (BREO ELLIPTA) 200-25 MCG/INH AEPB Inhale 1 puff into the lungs daily. 10/17/17   Mannam, Colbert Coyer, MD  loratadine (CLARITIN) 10 MG tablet Take 1 tablet (10 mg  total) by mouth daily. 09/08/17   Elpidio Anis, PA-C  predniSONE (DELTASONE) 10 MG tablet 6 tabs x 3 days, 5 tabs x 3 days, 4 tabs x 3 days, 3 tabs x 3 days, 2 tabs x 3 days, 1 tab x 3 days then stop 10/17/17   Mannam, Praveen, MD  predniSONE (DELTASONE) 20 MG tablet Take 3 tablets each day for the next 3 days. Patient not taking: Reported on 10/17/2017 09/08/17   Elpidio Anis, PA-C    Physical Exam:  Constitutional: Sitting forward in tripod position with nebulizer attached oxygen facemask able to  speak in 2 word phrases.  Profoundly dyspneic and using accessory muscles of respiration Vitals:   10/17/17 1409 10/17/17 1424 10/17/17 1430 10/17/17 1514  BP:    (!) 167/91  Pulse: 79 80 80 85  Resp:  16  17  Temp:      TempSrc:      SpO2: 90% 93% 100% 99%   Eyes: PERRL, lids and conjunctivae normal ENMT: Mucous membranes are dry. Posterior pharynx clear of any exudate or lesions.Normal dentition.  Neck: normal, supple, no masses, no thyromegaly Respiratory: Very tight with audible wheezing and rhonchi throughout, poor air movement, no dullness to percussion, accessory muscle use with tachypnea Cardiovascular: Tachycardic rate and regular rhythm, no murmurs / rubs / gallops. No extremity edema. 2+ pedal pulses. No carotid bruits.  Abdomen: no tenderness, no masses palpated. No hepatosplenomegaly. Bowel sounds positive.  Musculoskeletal: no clubbing / cyanosis. No joint deformity upper and lower extremities. Good ROM, no contractures. Normal muscle tone.  Skin: no rashes, lesions, ulcers. No induration Neurologic: CN 2-12 grossly intact. Sensation intact, DTR normal. Strength 5/5 in all 4.  Psychiatric: Normal judgment and insight. Alert and oriented x 3. Normal mood.    Labs on Admission: I have personally reviewed following labs and imaging studies  CBC: Recent Labs  Lab 10/17/17 1156  WBC 7.9  HGB 15.4  HCT 45.7  MCV 90.3  PLT 302   Basic Metabolic Panel: Recent Labs  Lab 10/17/17 1156  NA 140  K 3.7  CL 103  CO2 27  GLUCOSE 95  BUN <5*  CREATININE 0.89  CALCIUM 9.6   GFR: Estimated Creatinine Clearance: 123.6 mL/min (by C-G formula based on SCr of 0.89 mg/dL).  Urine analysis:    Component Value Date/Time   COLORURINE YELLOW (A) 09/06/2017 1951   APPEARANCEUR CLEAR (A) 09/06/2017 1951   LABSPEC 1.024 09/06/2017 1951   PHURINE 5.0 09/06/2017 1951   GLUCOSEU NEGATIVE 09/06/2017 1951   HGBUR NEGATIVE 09/06/2017 1951   BILIRUBINUR NEGATIVE 09/06/2017 1951    KETONESUR 5 (A) 09/06/2017 1951   PROTEINUR 30 (A) 09/06/2017 1951   NITRITE NEGATIVE 09/06/2017 1951   LEUKOCYTESUR NEGATIVE 09/06/2017 1951    Radiological Exams on Admission: Dg Chest 2 View  Result Date: 10/17/2017 CLINICAL DATA:  Cough EXAM: CHEST - 2 VIEW COMPARISON:  None. FINDINGS: Lungs are hyperexpanded. There is no edema or consolidation. Heart size and pulmonary vascularity are normal. No adenopathy. No evident bone lesions. IMPRESSION: Lungs hyperexpanded without edema or consolidation. Heart size normal. Electronically Signed   By: Bretta Bang III M.D.   On: 10/17/2017 12:29   Dg Chest Portable 1 View  Result Date: 10/17/2017 CLINICAL DATA:  Shortness of breath today. EXAM: PORTABLE CHEST 1 VIEW COMPARISON:  PA and lateral chest 10/17/2017. FINDINGS: The chest is hyperexpanded but the lungs are clear. No pneumothorax or pleural effusion. Heart size is normal. No acute bony  abnormality. IMPRESSION: Pulmonary hyperexpansion.  Lungs are clear. Electronically Signed   By: Drusilla Kannerhomas  Dalessio M.D.   On: 10/17/2017 15:38   Chest x-ray personally reviewed shows hyperexpanded lungs but otherwise no acute cardiopulmonary abnormality.  EKG: Independently reviewed.  Normal sinus rhythm with nonspecific ST-T wave abnormalities it was absolutely no change when compared to September 08, 2017  Assessment/Plan Principal Problem:   Respiratory distress Active Problems:   Severe persistent acute asthmatic bronchitis   Elevated blood pressure reading without diagnosis of hypertension    1.  Respiratory distress: Patient with severe respiratory distress not relieved with nebulizers, oxygen, steroids, and sent in from his pulmonologist for further evaluation.  We will place the patient on telemetry monitoring and monitor respiratory status closely.  He is starting to improve some with treatment in the emergency department.  I have consulted pulmonology for further assistance.  2.  Severe  persistent acute asthmatic bronchitis: Patient requiring admission due to profound poor air movement he is extremely tight.  He has been given 125 mg of Solu-Medrol IV he also got Solu-Medrol depot in the pulmonologist's office but with no significant improvement.  He will be started on 60 IV every 6, will continue albuterol, I have ordered an alpha-1 antitrypsin level.  I have consulted pulmonology.  I do not think that at this point the patient requires antibiotics.  However should he not improve significantly that would be a next logical step.  We will continue oxygen supplementation, nebulizers, steroids, and supportive care.  3.  Elevated blood pressure reading without diagnosis of hypertension.  This may have been brought on by steroids however will monitor closely while in the hospital.  EKG with no suggestion of LVH.  We will continue to monitor.  If blood pressure remains elevated tomorrow patient might benefit from low-dose antihypertensive.  He would likely require close outpatient monitoring as steroids may be increasing his blood pressure.  DVT prophylaxis: Lovenox Code Status: Full Family Communication: No family present at time of admission Disposition Plan: Hopefully home in 3 to 4 days [facility] Admission status: Inpatient telemetry   Lahoma Crockerheresa C Sheehan MD FACP [facility] Pager [phone]  If 7PM-7AM, please contact night-coverage [email or web address] Password [email or web address]  10/17/2017, 4:55 PM

## 2017-10-17 NOTE — Patient Instructions (Addendum)
We will give you depot injection and prednisone taper starting at 60 mg.  Reduce dose by 10 mg every 3 days We will give you samples of inhaler called Breo 200 and coupons to use to bring the price down We will get a chest x-ray today and CBC with differential, blood allergy profile. Continue using the albuterol as needed Please go to the ED if there is no improvement in symptoms. Follow-up in 2 to 4 weeks.

## 2017-10-17 NOTE — Consult Note (Signed)
Name: Shannon Knapp MRN: [id number] DOB: [birth year]    ADMISSION DATE:  10/17/2017 CONSULTATION DATE:  10/17/2017  REFERRING MD :  Dr. Denton Lank   CHIEF COMPLAINT:  Dyspnea   HISTORY OF PRESENT ILLNESS:  32 year old male with PMH of  Asthma, Past smoker (reportley quiet March 2019). Recent Visit to ED on 4/12 with purulent rhinorrhea, given doxycycline and 4/22 for cough/shortness of breath given steroid taper and nebs   5/31 Presented to Pulmonary Office (Dr. Isaiah Serge) with reported 3 days of dyspnea, white mucus production, and wheezing with noted hemoptysis. Given Severity of dyspnea and wheezing sent to ED for further work-up. Given 125 mg solu-medrol, magnesium 2 mg, albuterol 10 mg and 20 mg, in addition to Atrovent with minimal improvement. PCCM asked to consult.    SIGNIFICANT EVENTS  5/31 > Presents to ED   STUDIES:  CXR 5/31 > The chest is hyperexpanded but the lungs are clear. No pneumothorax or pleural effusion. Heart size is normal. No acute bony abnormality.  PAST MEDICAL HISTORY :   has a past medical history of Chronic bronchitis (HCC) and COPD (chronic obstructive pulmonary disease) (HCC).  has no past surgical history on file. Prior to Admission medications   Medication Sig Start Date End Date Taking? Authorizing Provider  albuterol (PROVENTIL) (2.5 MG/3ML) 0.083% nebulizer solution Take 6 mLs (5 mg total) by nebulization every 6 (six) hours as needed for wheezing or shortness of breath. 09/08/17   Upstill, Melvenia Beam, PA-C  fluticasone (FLONASE) 50 MCG/ACT nasal spray Place 2 sprays into both nostrils daily. 09/08/17   Elpidio Anis, PA-C  fluticasone furoate-vilanterol (BREO ELLIPTA) 200-25 MCG/INH AEPB Inhale 1 puff into the lungs daily for 1 day. 10/17/17 10/18/17  Mannam, Colbert Coyer, MD  fluticasone furoate-vilanterol (BREO ELLIPTA) 200-25 MCG/INH AEPB Inhale 1 puff into the lungs daily. 10/17/17   Mannam, Colbert Coyer, MD  loratadine (CLARITIN) 10 MG tablet Take 1 tablet  (10 mg total) by mouth daily. 09/08/17   Elpidio Anis, PA-C  predniSONE (DELTASONE) 10 MG tablet 6 tabs x 3 days, 5 tabs x 3 days, 4 tabs x 3 days, 3 tabs x 3 days, 2 tabs x 3 days, 1 tab x 3 days then stop 10/17/17   Mannam, Praveen, MD  predniSONE (DELTASONE) 20 MG tablet Take 3 tablets each day for the next 3 days. Patient not taking: Reported on 10/17/2017 09/08/17   Elpidio Anis, PA-C   Allergies  Allergen Reactions  . Amoxicillin Hives  . Penicillins Itching    FAMILY HISTORY:  family history is not on file. SOCIAL HISTORY:  reports that he has quit smoking. He has never used smokeless tobacco. He reports that he drinks about 9.0 oz of alcohol per week. He reports that he does not use drugs.  REVIEW OF SYSTEMS:   Constitutional: Negative for fever, chills, weight loss, malaise/fatigue and diaphoresis.  HENT: Negative for hearing loss, ear pain, nosebleeds, congestion, sore throat, neck pain, tinnitus and ear discharge.   Eyes: Negative for blurred vision, double vision, photophobia, pain, discharge and redness.  Respiratory: Negative for cough, hemoptysis, sputum production, shortness of breath, wheezing and stridor.   Cardiovascular: Negative for chest pain, palpitations, orthopnea, claudication, leg swelling and PND.  Gastrointestinal: Negative for heartburn, nausea, vomiting, abdominal pain, diarrhea, constipation, blood in stool and melena.  Genitourinary: Negative for dysuria, urgency, frequency, hematuria and flank pain.  Musculoskeletal: Negative for myalgias, back pain, joint pain and falls.  Skin: Negative for itching and rash.  Neurological: Negative  for dizziness, tingling, tremors, sensory change, speech change, focal weakness, seizures, loss of consciousness, weakness and headaches.  Endo/Heme/Allergies: Negative for environmental allergies and polydipsia. Does not bruise/bleed easily.  SUBJECTIVE:   VITAL SIGNS: Temp:  [98.7 F (37.1 C)] 98.7 F (37.1 C) (05/31  1126) Pulse Rate:  [79-86] 85 (05/31 1514) Resp:  [16-18] 17 (05/31 1514) BP: (130-167)/(85-91) 167/91 (05/31 1514) SpO2:  [90 %-100 %] 99 % (05/31 1514) Weight:  [163 lb 3.2 oz (74 kg)] 163 lb 3.2 oz (74 kg) (05/31 0939)  PHYSICAL EXAMINATION: General:  Acutely ill appearing male, moderate respiratory distress Neuro:  Alert and interactive, moving all ext to command HEENT:  [place]/AT, PERRL, EOM-I and MMM Cardiovascular:  RRR, Nl S1/S2 and -M/R/G Lungs:  Diffuse expiratory wheezes Abdomen:  Soft, NT, ND and +BS Musculoskeletal:  -edema and -tenderness Skin:  Intact  Recent Labs  Lab 10/17/17 1156  NA 140  K 3.7  CL 103  CO2 27  BUN <5*  CREATININE 0.89  GLUCOSE 95   Recent Labs  Lab 10/17/17 1156  HGB 15.4  HCT 45.7  WBC 7.9  PLT 302   Dg Chest 2 View  Result Date: 10/17/2017 CLINICAL DATA:  Cough EXAM: CHEST - 2 VIEW COMPARISON:  None. FINDINGS: Lungs are hyperexpanded. There is no edema or consolidation. Heart size and pulmonary vascularity are normal. No adenopathy. No evident bone lesions. IMPRESSION: Lungs hyperexpanded without edema or consolidation. Heart size normal. Electronically Signed   By: Bretta BangWilliam  Woodruff III M.D.   On: 10/17/2017 12:29   Dg Chest Portable 1 View  Result Date: 10/17/2017 CLINICAL DATA:  Shortness of breath today. EXAM: PORTABLE CHEST 1 VIEW COMPARISON:  PA and lateral chest 10/17/2017. FINDINGS: The chest is hyperexpanded but the lungs are clear. No pneumothorax or pleural effusion. Heart size is normal. No acute bony abnormality. IMPRESSION: Pulmonary hyperexpansion.  Lungs are clear. Electronically Signed   By: Drusilla Kannerhomas  Dalessio M.D.   On: 10/17/2017 15:38   I reviewed CXR myself, hyperinflation noted  ASSESSMENT / PLAN:  32 year old male with PMH of asthma presenting with an asthma exacerbation.  Was sent here from pulmonary office.  Discussed with PCCM-NP.  Asthma:  - Solumedrol  - Levaquin  - Hold off for cultures for the time  being  - Mucinex  - Xopenex  Allergies:  - Claritin  Tachycardia  - Change to xopenex  Hypoxemia:  - Titrate O2 for sat of 92-95%  PCCM will continue to follow.  Alyson ReedyWesam G. Yacoub, M.D. [facility]. Pager: [phone]. After hours pager: [phone].  10/17/2017, 4:16 PM

## 2017-10-17 NOTE — ED Provider Notes (Signed)
[facility] EMERGENCY DEPARTMENT Provider Note   CSN: [id number] Arrival date & time: 10/17/17  1105     History   Chief Complaint Chief Complaint  Patient presents with  . COPD    HPI Shannon Knapp Shannon Knapp is a 32 y.o. male.  Patient c/o sob and wheezing. Onset 1 week ago, getting progressive worse, severe, continuous. +increased non prod cough. No fever or chills. Hx copd. Denies prior intubation/icu. Has been uses mdi multiple times per day. Former smoker. Denies hx lung problems prior to past year or so. Saw pulmonary today for same and was d/c from their office to home, but felt no better. Chest diffusely tight, but no episodic or exertional chest pain. No leg pain or swelling. Denies recent allergy symptoms.   The history is provided by the patient and the spouse. The history is limited by the condition of the patient.    Past Medical History:  Diagnosis Date  . Chronic bronchitis (HCC)   . COPD (chronic obstructive pulmonary disease) (HCC)     There are no active problems to display for this patient.   History reviewed. No pertinent surgical history.      Home Medications    Prior to Admission medications   Medication Sig Start Date End Date Taking? Authorizing Provider  albuterol (PROVENTIL) (2.5 MG/3ML) 0.083% nebulizer solution Take 6 mLs (5 mg total) by nebulization every 6 (six) hours as needed for wheezing or shortness of breath. 09/08/17   Upstill, Melvenia Beam, PA-C  fluticasone (FLONASE) 50 MCG/ACT nasal spray Place 2 sprays into both nostrils daily. 09/08/17   Elpidio Anis, PA-C  fluticasone furoate-vilanterol (BREO ELLIPTA) 200-25 MCG/INH AEPB Inhale 1 puff into the lungs daily for 1 day. 10/17/17 10/18/17  Mannam, Colbert Coyer, MD  fluticasone furoate-vilanterol (BREO ELLIPTA) 200-25 MCG/INH AEPB Inhale 1 puff into the lungs daily. 10/17/17   Mannam, Colbert Coyer, MD  loratadine (CLARITIN) 10 MG tablet Take 1 tablet (10 mg total) by mouth daily. 09/08/17    Elpidio Anis, PA-C  predniSONE (DELTASONE) 10 MG tablet 6 tabs x 3 days, 5 tabs x 3 days, 4 tabs x 3 days, 3 tabs x 3 days, 2 tabs x 3 days, 1 tab x 3 days then stop 10/17/17   Mannam, Praveen, MD  predniSONE (DELTASONE) 20 MG tablet Take 3 tablets each day for the next 3 days. Patient not taking: Reported on 10/17/2017 09/08/17   Elpidio Anis, PA-C    Family History History reviewed. No pertinent family history.  Social History Social History   Tobacco Use  . Smoking status: Former Games developer  . Smokeless tobacco: Never Used  Substance Use Topics  . Alcohol use: Yes    Alcohol/week: 9.0 oz    Types: 15 Cans of beer per week  . Drug use: No     Allergies   Amoxicillin and Penicillins   Review of Systems Review of Systems  Constitutional: Negative for fever.  HENT: Negative for sore throat.   Eyes: Negative for redness.  Respiratory: Positive for cough, shortness of breath and wheezing.   Cardiovascular: Negative for leg swelling.  Gastrointestinal: Negative for vomiting.  Genitourinary: Negative for flank pain.  Musculoskeletal: Negative for back pain and neck pain.  Skin: Negative for rash.  Neurological: Negative for headaches.  Hematological: Does not bruise/bleed easily.  Psychiatric/Behavioral: Negative for confusion.     Physical Exam Updated Vital Signs BP 133/85 (BP Location: Right Arm)   Pulse 80   Temp 98.7 F (37.1 C) (Oral)  Resp 16   SpO2 100%   Physical Exam  Constitutional: He appears well-developed and well-nourished. He appears distressed.  HENT:  Mouth/Throat: Oropharynx is clear and moist.  Eyes: Conjunctivae are normal.  Neck: Neck supple. No tracheal deviation present.  Cardiovascular: Normal rate, regular rhythm, normal heart sounds and intact distal pulses. Exam reveals no gallop and no friction rub.  No murmur heard. Pulmonary/Chest: No accessory muscle usage. He is in respiratory distress. He has wheezes.  Abdominal: He exhibits no  distension. There is no tenderness.  Genitourinary:  Genitourinary Comments: No cva tenderness  Musculoskeletal: He exhibits no edema.  Neurological: He is alert.  Skin: Skin is warm. He is diaphoretic.  Psychiatric: He has a normal mood and affect.  Nursing note and vitals reviewed.    ED Treatments / Results  Labs (all labs ordered are listed, but only abnormal results are displayed) Results for orders placed or performed during the hospital encounter of 10/17/17  Basic metabolic panel  Result Value Ref Range   Sodium 140 135 - 145 mmol/L   Potassium 3.7 3.5 - 5.1 mmol/L   Chloride 103 101 - 111 mmol/L   CO2 27 22 - 32 mmol/L   Glucose, Bld 95 65 - 99 mg/dL   BUN <5 (L) 6 - 20 mg/dL   Creatinine, Ser 2.13 0.61 - 1.24 mg/dL   Calcium 9.6 8.9 - 08.6 mg/dL   GFR calc non Af Amer >60 >60 mL/min   GFR calc Af Amer >60 >60 mL/min   Anion gap 10 5 - 15  CBC  Result Value Ref Range   WBC 7.9 4.0 - 10.5 K/uL   RBC 5.06 4.22 - 5.81 MIL/uL   Hemoglobin 15.4 13.0 - 17.0 g/dL   HCT 57.8 46.9 - 62.9 %   MCV 90.3 78.0 - 100.0 fL   MCH 30.4 26.0 - 34.0 pg   MCHC 33.7 30.0 - 36.0 g/dL   RDW 52.8 41.3 - 24.4 %   Platelets 302 150 - 400 K/uL   Dg Chest 2 View  Result Date: 10/17/2017 CLINICAL DATA:  Cough EXAM: CHEST - 2 VIEW COMPARISON:  None. FINDINGS: Lungs are hyperexpanded. There is no edema or consolidation. Heart size and pulmonary vascularity are normal. No adenopathy. No evident bone lesions. IMPRESSION: Lungs hyperexpanded without edema or consolidation. Heart size normal. Electronically Signed   By: Bretta Bang III M.D.   On: 10/17/2017 12:29    EKG None  Radiology Dg Chest 2 View  Result Date: 10/17/2017 CLINICAL DATA:  Cough EXAM: CHEST - 2 VIEW COMPARISON:  None. FINDINGS: Lungs are hyperexpanded. There is no edema or consolidation. Heart size and pulmonary vascularity are normal. No adenopathy. No evident bone lesions. IMPRESSION: Lungs hyperexpanded without  edema or consolidation. Heart size normal. Electronically Signed   By: Bretta Bang III M.D.   On: 10/17/2017 12:29    Procedures Procedures (including critical care time)  Medications Ordered in ED Medications  albuterol (PROVENTIL,VENTOLIN) solution continuous neb (has no administration in time range)  ipratropium (ATROVENT) nebulizer solution 1 mg (has no administration in time range)  methylPREDNISolone sodium succinate (SOLU-MEDROL) 125 mg/2 mL injection 125 mg (has no administration in time range)  ipratropium-albuterol (DUONEB) 0.5-2.5 (3) MG/3ML nebulizer solution 3 mL (3 mLs Nebulization Given 10/17/17 1424)  sodium chloride 0.9 % bolus 1,000 mL (1,000 mLs Intravenous New Bag/Given 10/17/17 1432)  magnesium sulfate IVPB 2 g 50 mL (0 g Intravenous Stopped 10/17/17 1501)  albuterol (PROVENTIL,VENTOLIN) solution continuous  neb (10 mg/hr Nebulization Given 10/17/17 1424)     Initial Impression / Assessment and Plan / ED Course  I have reviewed the triage vital signs and the nursing notes.  Pertinent labs & imaging results that were available during my care of the patient were reviewed by me and considered in my medical decision making (see chart for details).  Iv ns. Labs.  Solumedrol iv. Continuous neb, albuterol 10.  After continuous neb, remains in resp distress, wheezing. Mg iv. Additional continuous neb with albuterol 20 and atrovent.   Labs reviewed - chem normal.  xrays reviewed - no pna.   Given persistent wheezing and resp distress, will arrange admission.  Mild improved air exchange. Degree of resp distress slowly improving.   Discussed with hospitalists, Dr Willette Pa - she will adimt - requests pulm consult.  Pulmonary consulted.  CRITICAL CARE  RE acute respiratory distress, severe wheezing/bronchospasm, copd/asthma exacerbation ?underlying etiology Performed by: Suzi Roots Total critical care time: 40 minutes Critical care time was exclusive of  separately billable procedures and treating other patients. Critical care was necessary to treat or prevent imminent or life-threatening deterioration. Critical care was time spent personally by me on the following activities: development of treatment plan with patient and/or surrogate as well as nursing, discussions with consultants, evaluation of patient's response to treatment, examination of patient, obtaining history from patient or surrogate, ordering and performing treatments and interventions, ordering and review of laboratory studies, ordering and review of radiographic studies, pulse oximetry and re-evaluation of patient's condition.   Final Clinical Impressions(s) / ED Diagnoses   Final diagnoses:  None    ED Discharge Orders    None       Cathren Laine, MD 10/17/17 1551

## 2017-10-17 NOTE — ED Notes (Signed)
Patient continues to report trouble breathing - wheezing auscultated bilaterally, patient initially refused albuterol neb in triage d/t having received neb in office PTA here. Will consult with quick-look PA-C. Patient currently in no apparent distress. Breathing equal, unlabored at rest.

## 2017-10-17 NOTE — ED Notes (Signed)
Report given to rn on 3e 

## 2017-10-17 NOTE — ED Notes (Signed)
Admitting doctor at the bedside 

## 2017-10-17 NOTE — Progress Notes (Signed)
Shannon Knapp    [id number]    1986/02/19  Primary Care Physician:Patient, No Pcp Per  Referring Physician: No referring provider defined for this encounter.  Chief complaint: Consult for COPD  HPI: 32 year old with history of chronic bronchitis, COPD. Told that he has COPD last year Has had multiple ED visits including [facility] in 2018 and at [facility] emergency on 09/07/2017 with cough, dyspnea, wheezing.  At last visit he was steroids, nebulizers.  X-ray at that time was clear.  Has been prescribed inhalers but is unable to afford them due to uninsured status.   Comes to the clinic today with 3 days of dyspnea with activity and at rest, cough with white mucus, wheezing.  He had few specks of blood when he coughed up.  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  Quit in March 2019.  Smokes marijuana occasionally Travel history: Lived in [place] for 10 years.  No other recent travel Relevant family history: No significant family history of lung disease.  Outpatient Encounter Medications as of 10/17/2017  Medication Sig  . albuterol (PROVENTIL) (2.5 MG/3ML) 0.083% nebulizer solution Take 6 mLs (5 mg total) by nebulization every 6 (six) hours as needed for wheezing or shortness of breath.  . fluticasone (FLONASE) 50 MCG/ACT nasal spray Place 2 sprays into both nostrils daily.  Marland Kitchen loratadine (CLARITIN) 10 MG tablet Take 1 tablet (10 mg total) by mouth daily.  . predniSONE (DELTASONE) 20 MG tablet Take 3 tablets each day for the next 3 days. (Patient not taking: Reported on 10/17/2017)   No facility-administered encounter medications on file as of 10/17/2017.     Allergies as of 10/17/2017 - Review Complete 10/17/2017  Allergen Reaction Noted  . Amoxicillin Hives 11/23/2016  . Penicillins Itching 11/15/2016    Past Medical History:    Diagnosis Date  . Chronic bronchitis (HCC)   . COPD (chronic obstructive pulmonary disease) (HCC)     No past surgical history on file.  No family history on file.  Social History   Socioeconomic History  . Marital status: Single    Spouse name: Not on file  . Number of children: Not on file  . Years of education: Not on file  . Highest education level: Not on file  Occupational History  . Not on file  Social Needs  . Financial resource strain: Not on file  . Food insecurity:    Worry: Not on file    Inability: Not on file  . Transportation needs:    Medical: Not on file    Non-medical: Not on file  Tobacco Use  . Smoking status: Former Games developer  . Smokeless tobacco: Never Used  Substance and Sexual Activity  . Alcohol use: Yes    Alcohol/week: 9.0 oz    Types: 15 Cans of beer per week  . Drug use: No  . Sexual activity: Yes  Lifestyle  . Physical activity:    Days per week: Not on file    Minutes per session: Not on file  . Stress: Not on file  Relationships  . Social connections:    Talks on phone: Not on file    Gets together: Not on file    Attends religious service: Not on file    Active member of club or organization: Not on file  Attends meetings of clubs or organizations: Not on file    Relationship status: Not on file  . Intimate partner violence:    Fear of current or ex partner: Not on file    Emotionally abused: Not on file    Physically abused: Not on file    Forced sexual activity: Not on file  Other Topics Concern  . Not on file  Social History Narrative  . Not on file    Review of systems: Review of Systems  Constitutional: Negative for fever and chills.  HENT: Negative.   Eyes: Negative for blurred vision.  Respiratory: as per HPI  Cardiovascular: Negative for chest pain and palpitations.  Gastrointestinal: Negative for vomiting, diarrhea, blood per rectum. Genitourinary: Negative for dysuria, urgency, frequency and hematuria.   Musculoskeletal: Negative for myalgias, back pain and joint pain.  Skin: Negative for itching and rash.  Neurological: Negative for dizziness, tremors, focal weakness, seizures and loss of consciousness.  Endo/Heme/Allergies: Negative for environmental allergies.  Psychiatric/Behavioral: Negative for depression, suicidal ideas and hallucinations.  All other systems reviewed and are negative.  Physical Exam: Blood pressure 130/90, pulse 86, height 5\' 11"  (1.803 m), weight 163 lb 3.2 oz (74 kg), SpO2 92 %. Gen:      No acute distress HEENT:  EOMI, sclera anicteric Neck:     No masses; no thyromegaly Lungs:    Marked expiratory wheeze, bilateral crackles. CV:         Regular rate and rhythm; no murmurs Abd:      + bowel sounds; soft, non-tender; no palpable masses, no distension Ext:    No edema; adequate peripheral perfusion Skin:      Warm and dry; no rash Neuro: alert and oriented x 3 Psych: normal mood and affect  Data Reviewed: Chest x-ray 11/23/2016 [From [facility] ED]- no acute cardiopulmonary disease Chest x-ray 09/07/2017- hyperinflation.  No active cardiopulmonary disease.  FENO 10/17/2017-95  Assessment:  Acute bronchitis, asthma with exacerbation He has been told he has COPD but the presentation is more consistent with asthma with acute exacerbation He is markedly symptomatic in office today.  Will give a nebulizer treatment and a depot injection Start prednisone at 60 mg.  Reduce dose by 10 mg every 3 days Given samples of Breo and coupons to use to help with the cost of medication  He does have systemic symptoms of weight loss, loss of appetite, specks of blood in the sputum.  I am not sure if he has another systemic process going on. Check CBC differential, blood allergy profile, chest x-ray At some point would like to get a CT of the chest but might be difficult given the fact that he is uninsured. Follow-up in 2 to 4 weeks.  I have advised him to go to the ED if there is any  worsening of symptoms  Plan/Recommendations: - Depot 80 mg injection, nebulizers in office - Prednisone taper starting at 60 mg - Samples of Breo - Chest x-ray, CBC differential, blood allergy profile  Chilton GreathousePraveen Kaavya Puskarich MD [facility] 10/17/2017, 9:52 AM  CC: No ref. provider found   Addendum: Given Xopenex nebs in the office and depot IM.  Patient however is markedly symptomatic with use of accessory muscles, increased work of breathing Advised him to go to the ED for further evaluation.

## 2017-10-17 NOTE — ED Triage Notes (Signed)
Patient sent by pulmonary MD for further evaluation of COPD. Received albuterol neb and steroid injection prior to arrival. Patient refuses another albuterol tx due to just receiving one. Cough and wheezing on arrival. Alert and oriented

## 2017-10-17 NOTE — ED Notes (Signed)
Pt's CBG result was 97. Informed Thayer Ohmhris - RN.

## 2017-10-17 NOTE — ED Notes (Signed)
Unsuccessful attempt to call [facility] rn will call me back

## 2017-10-17 NOTE — Progress Notes (Signed)
Patient brought to unit at this time from ED. Pt assisted to bed, call bell in place and pt oriented to call bell. Family present with pt. Pt placed on 02 at 2/L. No respiratory distress noted. Pt ambulated to bathroom and tolerated well. Tele placed. Meal ordered.

## 2017-10-18 ENCOUNTER — Inpatient Hospital Stay (HOSPITAL_COMMUNITY): Payer: Self-pay

## 2017-10-18 ENCOUNTER — Encounter (HOSPITAL_COMMUNITY): Payer: Self-pay | Admitting: Nurse Practitioner

## 2017-10-18 DIAGNOSIS — R0603 Acute respiratory distress: Secondary | ICD-10-CM

## 2017-10-18 DIAGNOSIS — J45902 Unspecified asthma with status asthmaticus: Secondary | ICD-10-CM | POA: Diagnosis present

## 2017-10-18 LAB — RESPIRATORY PANEL BY PCR
Adenovirus: NOT DETECTED
BORDETELLA PERTUSSIS-RVPCR: NOT DETECTED
CHLAMYDOPHILA PNEUMONIAE-RVPPCR: NOT DETECTED
Coronavirus 229E: NOT DETECTED
Coronavirus HKU1: NOT DETECTED
Coronavirus NL63: NOT DETECTED
Coronavirus OC43: NOT DETECTED
INFLUENZA A-RVPPCR: NOT DETECTED
Influenza B: NOT DETECTED
Metapneumovirus: NOT DETECTED
Mycoplasma pneumoniae: NOT DETECTED
PARAINFLUENZA VIRUS 3-RVPPCR: NOT DETECTED
PARAINFLUENZA VIRUS 4-RVPPCR: NOT DETECTED
Parainfluenza Virus 1: NOT DETECTED
Parainfluenza Virus 2: NOT DETECTED
RHINOVIRUS / ENTEROVIRUS - RVPPCR: NOT DETECTED
Respiratory Syncytial Virus: NOT DETECTED

## 2017-10-18 LAB — BASIC METABOLIC PANEL
ANION GAP: 10 (ref 5–15)
BUN: 6 mg/dL (ref 6–20)
CHLORIDE: 100 mmol/L — AB (ref 101–111)
CO2: 26 mmol/L (ref 22–32)
Calcium: 9.6 mg/dL (ref 8.9–10.3)
Creatinine, Ser: 0.93 mg/dL (ref 0.61–1.24)
GFR calc Af Amer: >60 mL/min (ref >60)
GLUCOSE: 146 mg/dL — AB (ref 65–99)
POTASSIUM: 4.9 mmol/L (ref 3.5–5.1)
SODIUM: 136 mmol/L (ref 135–145)

## 2017-10-18 LAB — BLOOD GAS, ARTERIAL
Acid-Base Excess: 0.4 mmol/L (ref 0.0–2.0)
BICARBONATE: 24.8 mmol/L (ref 20.0–28.0)
Drawn by: 517021
O2 Content: 2 L/min
O2 Saturation: 95 %
PATIENT TEMPERATURE: 98.6
PH ART: 7.386 (ref 7.350–7.450)
PO2 ART: 78.5 mmHg — AB (ref 83.0–108.0)
pCO2 arterial: 42.3 mmHg (ref 32.0–48.0)

## 2017-10-18 LAB — LACTIC ACID, PLASMA
LACTIC ACID, VENOUS: 3.9 mmol/L — AB (ref 0.5–1.9)
LACTIC ACID, VENOUS: 3.2 mmol/L — AB (ref 0.5–1.9)

## 2017-10-18 LAB — URINALYSIS, ROUTINE W REFLEX MICROSCOPIC
BACTERIA UA: NONE SEEN
Bilirubin Urine: NEGATIVE
GLUCOSE, UA: 50 mg/dL — AB
Hgb urine dipstick: NEGATIVE
Ketones, ur: NEGATIVE mg/dL
LEUKOCYTES UA: NEGATIVE
Nitrite: NEGATIVE
PH: 6 (ref 5.0–8.0)
PROTEIN: NEGATIVE mg/dL
Specific Gravity, Urine: 1.006 (ref 1.005–1.030)

## 2017-10-18 LAB — RAPID URINE DRUG SCREEN, HOSP PERFORMED
Amphetamines: NOT DETECTED
BENZODIAZEPINES: NOT DETECTED
Barbiturates: NOT DETECTED
COCAINE: NOT DETECTED
OPIATES: NOT DETECTED
TETRAHYDROCANNABINOL: NOT DETECTED

## 2017-10-18 LAB — GLUCOSE, CAPILLARY
GLUCOSE-CAPILLARY: 114 mg/dL — AB (ref 65–99)
GLUCOSE-CAPILLARY: 107 mg/dL — AB (ref 65–99)
Glucose-Capillary: 126 mg/dL — ABNORMAL HIGH (ref 65–99)
Glucose-Capillary: 120 mg/dL — ABNORMAL HIGH (ref 65–99)

## 2017-10-18 LAB — CBC WITH DIFFERENTIAL/PLATELET
BASOS ABS: 0 10*3/uL (ref 0.0–0.1)
Basophils Relative: 0 %
EOS PCT: 2 %
Eosinophils Absolute: 0.2 10*3/uL (ref 0.0–0.7)
HEMATOCRIT: 47.2 % (ref 39.0–52.0)
Hemoglobin: 15.6 g/dL (ref 13.0–17.0)
LYMPHS ABS: 1.5 10*3/uL (ref 0.7–4.0)
LYMPHS PCT: 18 %
MCH: 30.1 pg (ref 26.0–34.0)
MCHC: 33.1 g/dL (ref 30.0–36.0)
MCV: 90.9 fL (ref 78.0–100.0)
Monocytes Absolute: 0.2 10*3/uL (ref 0.1–1.0)
Monocytes Relative: 2 %
NEUTROS ABS: 6.5 10*3/uL (ref 1.7–7.7)
Neutrophils Relative %: 78 %
Platelets: 296 10*3/uL (ref 150–400)
RBC: 5.19 MIL/uL (ref 4.22–5.81)
RDW: 13.2 % (ref 11.5–15.5)
WBC: 8.4 10*3/uL (ref 4.0–10.5)

## 2017-10-18 LAB — EXPECTORATED SPUTUM ASSESSMENT W GRAM STAIN, RFLX TO RESP C

## 2017-10-18 LAB — EXPECTORATED SPUTUM ASSESSMENT W REFEX TO RESP CULTURE

## 2017-10-18 LAB — ALPHA-1-ANTITRYPSIN: A1 ANTITRYPSIN SER: 142 mg/dL (ref 90–200)

## 2017-10-18 LAB — HIV ANTIBODY (ROUTINE TESTING W REFLEX): HIV SCREEN 4TH GENERATION: NONREACTIVE

## 2017-10-18 MED ORDER — LEVALBUTEROL HCL 0.63 MG/3ML IN NEBU
0.6300 mg | INHALATION_SOLUTION | RESPIRATORY_TRACT | Status: DC
  Administered 2017-10-18: 0.63 mg via RESPIRATORY_TRACT
  Filled 2017-10-18: qty 3

## 2017-10-18 MED ORDER — PREMIER PROTEIN SHAKE
11.0000 [oz_av] | Freq: Three times a day (TID) | ORAL | Status: DC
  Administered 2017-10-18 – 2017-10-26 (×17): 11 [oz_av] via ORAL
  Filled 2017-10-18 (×7): qty 325.31
  Filled 2017-10-18: qty 325
  Filled 2017-10-18 (×12): qty 325.31
  Filled 2017-10-18: qty 325
  Filled 2017-10-18 (×26): qty 325.31

## 2017-10-18 MED ORDER — LEVALBUTEROL HCL 0.63 MG/3ML IN NEBU
0.6300 mg | INHALATION_SOLUTION | RESPIRATORY_TRACT | Status: DC
  Administered 2017-10-18 – 2017-10-20 (×13): 0.63 mg via RESPIRATORY_TRACT
  Filled 2017-10-18 (×13): qty 3

## 2017-10-18 MED ORDER — METHYLPREDNISOLONE SODIUM SUCC 40 MG IJ SOLR: 40.0000 mg | Freq: Two times a day (BID) | INTRAMUSCULAR | Status: DC

## 2017-10-18 MED ORDER — METHYLPREDNISOLONE SODIUM SUCC 125 MG IJ SOLR
80.0000 mg | Freq: Four times a day (QID) | INTRAMUSCULAR | Status: DC
  Administered 2017-10-18: 20 mg via INTRAVENOUS
  Administered 2017-10-18 – 2017-10-20 (×8): 80 mg via INTRAVENOUS
  Filled 2017-10-18 (×8): qty 2

## 2017-10-18 MED ORDER — IPRATROPIUM-ALBUTEROL 0.5-2.5 (3) MG/3ML IN SOLN: 3.0000 mL | RESPIRATORY_TRACT | Status: DC

## 2017-10-18 MED ORDER — SODIUM CHLORIDE 0.9 % IV BOLUS
500.0000 mL | Freq: Once | INTRAVENOUS | Status: AC
  Administered 2017-10-18: 500 mL via INTRAVENOUS

## 2017-10-18 MED ORDER — IPRATROPIUM-ALBUTEROL 0.5-2.5 (3) MG/3ML IN SOLN
3.0000 mL | Freq: Three times a day (TID) | RESPIRATORY_TRACT | Status: DC
  Administered 2017-10-18: 3 mL via RESPIRATORY_TRACT
  Filled 2017-10-18: qty 3

## 2017-10-18 MED ORDER — ALBUTEROL SULFATE (2.5 MG/3ML) 0.083% IN NEBU: 2.5000 mg | INHALATION_SOLUTION | RESPIRATORY_TRACT | Status: DC | PRN

## 2017-10-18 MED ORDER — LEVOFLOXACIN 750 MG PO TABS: 750.0000 mg | ORAL_TABLET | Freq: Every day | ORAL | Status: DC

## 2017-10-18 MED ORDER — HYDRALAZINE HCL 20 MG/ML IJ SOLN: 10.0000 mg | Freq: Four times a day (QID) | INTRAMUSCULAR | Status: DC | PRN

## 2017-10-18 MED ORDER — POTASSIUM CHLORIDE IN NACL 20-0.9 MEQ/L-% IV SOLN
INTRAVENOUS | Status: DC
  Administered 2017-10-18: 22:00:00 via INTRAVENOUS
  Filled 2017-10-18: qty 1000

## 2017-10-18 MED ORDER — MAGNESIUM SULFATE 2 GM/50ML IV SOLN
2.0000 g | Freq: Once | INTRAVENOUS | Status: AC
  Administered 2017-10-18: 2 g via INTRAVENOUS
  Filled 2017-10-18: qty 50

## 2017-10-18 MED ORDER — IPRATROPIUM BROMIDE 0.02 % IN SOLN
0.5000 mg | RESPIRATORY_TRACT | Status: DC
  Administered 2017-10-18 – 2017-10-20 (×14): 0.5 mg via RESPIRATORY_TRACT
  Filled 2017-10-18 (×14): qty 2.5

## 2017-10-18 MED ORDER — PNEUMOCOCCAL VAC POLYVALENT 25 MCG/0.5ML IJ INJ
0.5000 mL | INJECTION | Freq: Once | INTRAMUSCULAR | Status: AC
  Administered 2017-10-19: 0.5 mL via INTRAMUSCULAR
  Filled 2017-10-18: qty 0.5

## 2017-10-18 MED ORDER — LEVOFLOXACIN 500 MG PO TABS
500.0000 mg | ORAL_TABLET | Freq: Every day | ORAL | Status: AC
  Administered 2017-10-18 – 2017-10-21 (×4): 500 mg via ORAL
  Filled 2017-10-18 (×4): qty 1

## 2017-10-18 NOTE — Progress Notes (Signed)
Patient Demographics:    Shannon Knapp, is a 32 y.o. male, DOB - [birth year], ZOX:[id number]  Admit date - 10/17/2017   Admitting Physician Lahoma Crocker, MD  Outpatient Primary MD for the patient is Patient, No Pcp Per  LOS - 1   Chief Complaint  Patient presents with  . COPD        Subjective:    Shannon Knapp today has no fevers, no emesis,  No chest pain, shortness of breath persists, cough persists, dad and significant other at bedside, questions answered  Assessment  & Plan :    Principal Problem:   Respiratory distress Active Problems:   Severe persistent acute asthmatic bronchitis   Elevated blood pressure reading without diagnosis of hypertension   Status asthmaticus  Brief summary  32 y.o. male who is a reformed smoker (10 pack yrs) with medical history significant of chronic bronchitis and COPD Versus Asthma who was sent over from about her pulmonary's office for direct admission on 10/17/2017 due to respiratory distress   PlAn:- 1)Asthma exacerbation/severe persistent acute asthmatic bronchitis--cough shortness of breath and hypoxia remains challenging, continue Solu-Medrol, bronchodilators including albuterol and Atrovent pulmonary service added Xopenex, no evidence of pneumonia.  Patient received magnesium.  ABG looked reassuring , respiratory panel  negative  2) elevated blood pressure--- no prior history of hypertension, patient is on steroids, hydralazine as needed as ordered  Code Status : full   Disposition Plan  : home  Consults  :  pulm   DVT Prophylaxis  :  Lovenox   Lab Results  Component Value Date   PLT 296 10/18/2017    Inpatient Medications  Scheduled Meds: . dextromethorphan-guaiFENesin  1 tablet Oral BID  . enoxaparin (LOVENOX) injection  40 mg Subcutaneous Q24H  . insulin aspart  0-9 Units Subcutaneous TID WC  . ipratropium  0.5 mg  Nebulization Q4H  . levalbuterol  0.63 mg Nebulization Q4H  . levofloxacin  500 mg Oral Daily  . methylPREDNISolone (SOLU-MEDROL) injection  80 mg Intravenous Q6H  . pneumococcal 23 valent vaccine  0.5 mL Intramuscular Once  . protein supplement shake  11 oz Oral TID BM  . sodium chloride flush  3 mL Intravenous Q12H  . sodium chloride flush  3 mL Intravenous Q12H  . traMADol  100 mg Oral Q6H   Continuous Infusions: . sodium chloride    . 0.9 % NaCl with KCl 20 mEq / L     PRN Meds:.sodium chloride, acetaminophen **OR** acetaminophen, albuterol, ondansetron **OR** ondansetron (ZOFRAN) IV, sodium chloride flush, traMADol    Anti-infectives (From admission, onward)   Start     Dose/Rate Route Frequency Ordered Stop   10/18/17 1800  levofloxacin (LEVAQUIN) tablet 750 mg  Status:  Discontinued     750 mg Oral Daily 10/18/17 1033 10/18/17 1039   10/18/17 1800  levofloxacin (LEVAQUIN) tablet 500 mg     500 mg Oral Daily 10/18/17 1039 10/22/17 1759   10/17/17 1800  levofloxacin (LEVAQUIN) IVPB 750 mg  Status:  Discontinued     750 mg 100 mL/hr over 90 Minutes Intravenous Every 24 hours 10/17/17 1620 10/18/17 1033        Objective:   Vitals:   10/18/17 0332 10/18/17 0740 10/18/17 1227 10/18/17  1542  BP: 132/82  (!) 145/87   Pulse: 85  88   Resp: 18  20   Temp: 97.6 F (36.4 C)  97.8 F (36.6 C)   TempSrc: Oral  Oral   SpO2: 100% 100% 96% 95%  Weight: 72.4 kg (159 lb 9.6 oz)       Wt Readings from Last 3 Encounters:  10/18/17 72.4 kg (159 lb 9.6 oz)  10/17/17 74 kg (163 lb 3.2 oz)  09/07/17 77.6 kg (171 lb)     Intake/Output Summary (Last 24 hours) at 10/18/2017 1829 Last data filed at 10/18/2017 1647 Gross per 24 hour  Intake 2959.17 ml  Output 1750 ml  Net 1209.17 ml     Physical Exam  Gen:- Awake Alert, with shortness of breath HEENT:- [place].AT, No sclera icterus Nose- [place] 2 L/min Neck-Supple Neck,No JVD,.  Lungs-fair air movement, scattered wheezes  CV- S1, S2  normal Abd-  +ve B.Sounds, Abd Soft, No tenderness,    Extremity/Skin:- No  edema,   +ve pulses Psych-affect is appropriate, oriented x3 Neuro-no new focal deficits, no tremors   Data Review:   Micro Results Recent Results (from the past 240 hour(s))  Respiratory Panel by PCR     Status: None   Collection Time: 10/18/17 11:12 AM  Result Value Ref Range Status   Adenovirus NOT DETECTED NOT DETECTED Final   Coronavirus 229E NOT DETECTED NOT DETECTED Final   Coronavirus HKU1 NOT DETECTED NOT DETECTED Final   Coronavirus NL63 NOT DETECTED NOT DETECTED Final   Coronavirus OC43 NOT DETECTED NOT DETECTED Final   Metapneumovirus NOT DETECTED NOT DETECTED Final   Rhinovirus / Enterovirus NOT DETECTED NOT DETECTED Final   Influenza A NOT DETECTED NOT DETECTED Final   Influenza B NOT DETECTED NOT DETECTED Final   Parainfluenza Virus 1 NOT DETECTED NOT DETECTED Final   Parainfluenza Virus 2 NOT DETECTED NOT DETECTED Final   Parainfluenza Virus 3 NOT DETECTED NOT DETECTED Final   Parainfluenza Virus 4 NOT DETECTED NOT DETECTED Final   Respiratory Syncytial Virus NOT DETECTED NOT DETECTED Final   Bordetella pertussis NOT DETECTED NOT DETECTED Final   Chlamydophila pneumoniae NOT DETECTED NOT DETECTED Final   Mycoplasma pneumoniae NOT DETECTED NOT DETECTED Final    Comment: Performed at [facility], [address] [place], [place] [address]  Culture, sputum-assessment     Status: None   Collection Time: 10/18/17 11:23 AM  Result Value Ref Range Status   Specimen Description SPUTUM  Final   Special Requests NONE  Final   Sputum evaluation   Final    THIS SPECIMEN IS ACCEPTABLE FOR SPUTUM CULTURE Performed at [facility], [address] [place], [place] [address]    Report Status 10/18/2017 FINAL  Final  Culture, respiratory (NON-Expectorated)     Status: None (Preliminary result)   Collection Time: 10/18/17 11:23 AM  Result Value Ref Range Status   Specimen Description  SPUTUM  Final   Special Requests NONE Reflexed from [id number]  Final   Gram Stain   Final    RARE WBC PRESENT, PREDOMINANTLY PMN FEW YEAST FEW GRAM POSITIVE COCCI IN CHAINS FEW GRAM POSITIVE RODS RARE GRAM NEGATIVE RODS Performed at [facility], [address] [place], [place] [address]    Culture PENDING  Incomplete   Report Status PENDING  Incomplete    Radiology Reports Dg Chest 2 View  Result Date: 10/17/2017 CLINICAL DATA:  Cough EXAM: CHEST - 2 VIEW COMPARISON:  None. FINDINGS: Lungs are hyperexpanded. There is no edema or consolidation. Heart size and pulmonary vascularity are normal. No adenopathy. No evident bone lesions. IMPRESSION: Lungs hyperexpanded without edema or consolidation. Heart size normal. Electronically Signed   By: Bretta Bang III M.D.   On: 10/17/2017 12:29   Dg Chest Port 1 View  Result Date: 10/18/2017 CLINICAL DATA:  32 year old male with asthma exacerbation EXAM: PORTABLE CHEST 1 VIEW COMPARISON:  Prior chest x-ray 10/17/2017 FINDINGS: The lungs are clear and negative for focal airspace consolidation, pulmonary edema or suspicious pulmonary nodule. The degree of pulmonary hyperinflation is similar compared to prior. No pleural effusion or pneumothorax. Cardiac and mediastinal contours are within normal limits. No acute fracture or lytic or blastic osseous lesions. The visualized upper abdominal bowel gas pattern is unremarkable. IMPRESSION: 1. No acute cardiopulmonary process. 2. Similar degree of pulmonary hyperinflation. Electronically Signed   By: Malachy Moan M.D.   On: 10/18/2017 12:34   Dg Chest Portable 1 View  Result Date: 10/17/2017 CLINICAL DATA:  Shortness of breath today. EXAM: PORTABLE CHEST 1 VIEW COMPARISON:  PA and lateral chest 10/17/2017. FINDINGS: The chest is hyperexpanded but the lungs are clear. No pneumothorax or pleural effusion. Heart size is normal. No acute bony abnormality. IMPRESSION: Pulmonary hyperexpansion.  Lungs  are clear. Electronically Signed   By: Drusilla Kanner M.D.   On: 10/17/2017 15:38     CBC Recent Labs  Lab 10/17/17 1156 10/17/17 1811 10/18/17 0719  WBC 7.9 7.6 8.4  HGB 15.4 15.0 15.6  HCT 45.7 44.9 47.2  PLT 302 267 296  MCV 90.3 90.5 90.9  MCH 30.4 30.2 30.1  MCHC 33.7 33.4 33.1  RDW 13.3 13.2 13.2  LYMPHSABS  --  1.2 1.5  MONOABS  --  0.2 0.2  EOSABS  --  0.2 0.2  BASOSABS  --  0.0 0.0    Chemistries  Recent Labs  Lab 10/17/17 1156 10/17/17 1751 10/18/17 0719  NA 140  --  136  K 3.7  --  4.9  CL 103  --  100*  CO2 27  --  26  GLUCOSE 95  --  146*  BUN <5*  --  6  CREATININE 0.89 0.98 0.93  CALCIUM 9.6  --  9.6   ------------------------------------------------------------------------------------------------------------------ No results for input(s): CHOL, HDL, LDLCALC, TRIG, CHOLHDL, LDLDIRECT in the last 72 hours.  No results found for: HGBA1C ------------------------------------------------------------------------------------------------------------------ Recent Labs    10/17/17 1753  TSH 0.359   ------------------------------------------------------------------------------------------------------------------ No results for input(s): VITAMINB12, FOLATE, FERRITIN, TIBC, IRON, RETICCTPCT in the last 72 hours.  Coagulation profile No results for input(s): INR, PROTIME in the last 168 hours.  No results for input(s): DDIMER in the last 72 hours.  Cardiac Enzymes No results for input(s): CKMB, TROPONINI, MYOGLOBIN in the last 168 hours.  Invalid input(s): CK ------------------------------------------------------------------------------------------------------------------ No results found for: BNP   Shon Hale M.D on 10/18/2017 at 6:29 PM  Between 7am to 7pm - Pager - [phone]  After 7pm go to [email or web address] - password [email or web address]  [facility] -  Office  [phone]   Voice Recognition Reubin Milan dictation system was used to create this  note, attempts have been made to correct errors. Please contact the author with questions and/or clarifications.

## 2017-10-18 NOTE — Progress Notes (Signed)
Initial Nutrition Assessment  DOCUMENTATION CODES:  Severe malnutrition in context of acute illness/injury  INTERVENTION:  Premier Protein TID  Magic Cup Q24 each supplement provides 290 kcal and 9 grams of protein  Regular Icecream Q24  NUTRITION DIAGNOSIS:  Severe Malnutrition(Acute) related to poor appetite, acute illness(Severe persistent Acute asthmatic bronchits) as evidenced by an estimated energy intake that has met </= to 75% needs for >/= to 1 month and a loss of 7.4% bw in 1.5 months. .  GOAL:  Patient will meet greater than or equal to 90% of their needs  MONITOR:  PO intake, Supplement acceptance, Labs, Weight trends, I & O's  REASON FOR ASSESSMENT:  Malnutrition Screening Tool    ASSESSMENT:  32 y/o male PMHx chronic bronchitis, COPD. Presented from outpatient pulmonology to which he arrived with 3 days SOB. Referred to hospital due to respiratory distress refractory to nebulizers, steroids, O2. Admitted for severe persistent acute asthmatic bronchitis.   Patient seen with S/O. He reports a significant decline in appetite that first began about the time of Easter (4/21). He says this day was memorable because he went to his church where a very large special meal occasion was happening, but was only able to eat small amounts; he had no appetite. This has continued. S/O reports patient having  Uncharacteristically minimal intake over the past month or so; they believe he is eating <50% of his normal amount, when before he "could eat an entire lemon meringue pie in a sitting and be fine"  Weight wise. He says he was at a weight of 173 lbs when his symptoms began. Per chart, he was 172 lbs 4/21 and he is now 159.6 lbs. This is a loss of 7.4% lb in 1.5 months, which is clinically significant.   At this time, patient still reports minimal intake. He ate little breakfast and was only able to eat a small amount of chicken for Lunch. Intake documentation shows patient ate 100% of  todays Breakfast and Lunch? Question if S/O ate trays.   RD recommended nutritional supplements given his abiltiy to only eat small amounts. He did not want Ensure/Boost. RD mentioned other supplements on formulary. He was interested in premier protein. He also noted he likes ice cream. Will add icecream and a magic cup to his trays.   NFPE; Deferred at this time.   Labs: BG: 114-146   Meds: Insulin, Methylprednisolone, Ultram, IVF   NUTRITION - FOCUSED PHYSICAL EXAM: Deferred  Diet Order:   Diet Order           Diet regular Room service appropriate? Yes; Fluid consistency: Thin  Diet effective now         EDUCATION NEEDS:  No education needs have been identified at this time  Skin:  Skin Assessment: Reviewed RN Assessment  Last BM:  5/31  Height Ht Readings from Last 1 Encounters:  10/17/17 5' 11" (1.803 m)   Weight:  Wt Readings from Last 1 Encounters:  10/18/17 159 lb 9.6 oz (72.4 kg)   Wt Readings from Last 10 Encounters:  10/18/17 159 lb 9.6 oz (72.4 kg)  10/17/17 163 lb 3.2 oz (74 kg)  09/07/17 171 lb (77.6 kg)  09/07/17 171 lb (77.6 kg)  08/29/17 172 lb (78 kg)   Ideal Body Weight:  78.18 kg  BMI:  Body mass index is 22.26 kg/m.  Estimated Nutritional Needs:  Kcal:  2150-2300 kcals (30-32 kcal/kg bw) Protein:  95-109g Pro (1.3-1.5g.kg bw) Fluid:  2.1-2.3 L (1   ml/kcal)    RD, LDN, CNSC Clinical Nutrition Available Tues-Sat via Pager: [phone] 10/18/2017 5:49 PM  

## 2017-10-18 NOTE — Clinical Social Work Note (Signed)
CSW acknowledges consult that patient needs a PCP. Please consult RNCM.   CSW signing off. Consult again if any social work needs arise.  Charlynn CourtSarah Eamon Tantillo, CSW [phone]

## 2017-10-18 NOTE — Progress Notes (Signed)
Called by RN for PRN tx for patient due to Expiratory Wheezing. Patient had Albuterol treatments prn but patient refused stating that albuterol alone is not helping him so he didn't want it. RN paged MD who stated he would put in orders for the treatments. Patient SPO2 is 97% and ambulating to bathroom with assistance from family member. RT will continue to monitor.

## 2017-10-18 NOTE — Progress Notes (Signed)
Feels a little better.  WOB decreased. Comfortable at rest  Simonne MartinetPeter E Savana Spina ACNP-BC [facility] Pulmonary/Critical Care Pager # [phone] OR # [phone] if no answer

## 2017-10-18 NOTE — Progress Notes (Signed)
Name: Shannon BennettRayshawn M Haxton MRN: [id number] DOB: 08/13/1985    ADMISSION DATE:  10/17/2017 CONSULTATION DATE:  10/17/2017  REFERRING MD :  Dr. Denton LankSteinl   CHIEF COMPLAINT:  Dyspnea  brief 32 year old male with PMH of  Asthma, Past smoker (reportley quiet March 2019). Recent Visit to ED on 4/12 with purulent rhinorrhea, given doxycycline and 4/22 for cough/shortness of breath given steroid taper and nebs   5/31 Presented to Pulmonary Office (Dr. Isaiah SergeMannam) with reported 3 days of dyspnea, white mucus production, and wheezing with noted hemoptysis. feno 95 . Given Severity of dyspnea and wheezing sent to ED for further work-up. Given 125 mg solu-medrol, magnesium 2 mg, albuterol 10 mg and 20 mg, in addition to Atrovent with minimal improvement. PCCM asked to consult.    SIGNIFICANT EVENTS  5/31 > Presents to ED   CXR 5/31 > The chest is hyperexpanded but the lungs are clear. No pneumothorax or pleural effusion. Heart size is normal. No acute bony abnormality.    SUBJECTIVE/OVERNIGHT/INTERVAL HX 10/18/17 - HIV and Urine TOx negative. Patient reports only marginally better. RN says he is unable to talk even on phone and very wheezy. Says albuterol not helping and wanting atrovent. Nebs just given without improvement.    VITAL SIGNS: Temp:  [97.4 F (36.3 C)-98.7 F (37.1 C)] 97.6 F (36.4 C) (06/01 0332) Pulse Rate:  [77-105] 85 (06/01 0332) Resp:  [16-23] 18 (06/01 0332) BP: (123-167)/(73-95) 132/82 (06/01 0332) SpO2:  [87 %-100 %] 100 % (06/01 0740) Weight:  [72.4 kg (159 lb 9.6 oz)] 72.4 kg (159 lb 9.6 oz) (06/01 0332)  PHYSICAL EXAMINATION:  General Appearance:    Lying in bed flat , calm, not attemptint to talk, audible wheeze heard from 2 feet away  Head:    Normocephalic, without obvious abnormality, atraumatic  Eyes:    PERRL - yes, conjunctiva/corneas - clear      Ears:    Normal external ear canals, both ears  Nose:   NG tube - no bu thas [place]  Throat:  ETT TUBE - no , OG  tube - no  Neck:   Supple,  No enlargement/tenderness/nodules     Lungs:     Diffuse audible wheeze +, Mild tachypnea. He is not attemptint to talk. Able to lie flat. No acc muscle use  Chest wall:    No deformity  Heart:    S1 and S2 normal, no murmur, CVP - no.  Pressors - no  Abdomen:     Soft, no masses, no organomegaly  Genitalia:    Not done  Rectal:   not done  Extremities:   Extremities- intact     Skin:   Intact in exposed areas     Neurologic:   Sedation - none -> RASS - +1 . Moves all 4s - yes. CAM-ICU - neg . Orientation - x3 +      PULMONARY Recent Labs  Lab 10/18/17 0320  PHART 7.386  PCO2ART 42.3  PO2ART 78.5*  HCO3 24.8  O2SAT 95.0    CBC Recent Labs  Lab 10/17/17 1156 10/17/17 1811 10/18/17 0719  HGB 15.4 15.0 15.6  HCT 45.7 44.9 47.2  WBC 7.9 7.6 8.4  PLT 302 267 296    COAGULATION No results for input(s): INR in the last 168 hours.  CARDIAC  No results for input(s): TROPONINI in the last 168 hours. No results for input(s): PROBNP in the last 168 hours.   CHEMISTRY Recent Labs  Lab  10/17/17 1156 10/17/17 1751 10/18/17 0719  NA 140  --  136  K 3.7  --  4.9  CL 103  --  100*  CO2 27  --  26  GLUCOSE 95  --  146*  BUN <5*  --  6  CREATININE 0.89 0.98 0.93  CALCIUM 9.6  --  9.6   Estimated Creatinine Clearance: 115.7 mL/min (by C-G formula based on SCr of 0.93 mg/dL).   LIVER No results for input(s): AST, ALT, ALKPHOS, BILITOT, PROT, ALBUMIN, INR in the last 168 hours.   INFECTIOUS No results for input(s): LATICACIDVEN, PROCALCITON in the last 168 hours.   ENDOCRINE CBG (last 3)  Recent Labs    10/17/17 1807 10/17/17 2250 10/18/17 0749  GLUCAP 97 169* 126*         IMAGING x48h  - image(s) personally visualized  -   highlighted in bold Dg Chest 2 View  Result Date: 10/17/2017 CLINICAL DATA:  Cough EXAM: CHEST - 2 VIEW COMPARISON:  None. FINDINGS: Lungs are hyperexpanded. There is no edema or consolidation. Heart  size and pulmonary vascularity are normal. No adenopathy. No evident bone lesions. IMPRESSION: Lungs hyperexpanded without edema or consolidation. Heart size normal. Electronically Signed   By: Bretta Bang III M.D.   On: 10/17/2017 12:29   Dg Chest Portable 1 View  Result Date: 10/17/2017 CLINICAL DATA:  Shortness of breath today. EXAM: PORTABLE CHEST 1 VIEW COMPARISON:  PA and lateral chest 10/17/2017. FINDINGS: The chest is hyperexpanded but the lungs are clear. No pneumothorax or pleural effusion. Heart size is normal. No acute bony abnormality. IMPRESSION: Pulmonary hyperexpansion.  Lungs are clear. Electronically Signed   By: Drusilla Kanner M.D.   On: 10/17/2017 15:38     ASSESSMENT / PLAN:  32 year old male with PMH of asthma presenting with an asthma exacerbation.  Was sent here from pulmonary office.  Discussed with PCCM-NP.   Principal Problem:   Respiratory distress Active Problems:   Severe persistent acute asthmatic bronchitis   Elevated blood pressure reading without diagnosis of hypertension   Status asthmaticus    Status asthmaticus PEr comparision of exam notes he might have improved significantly but clinically and per him only marginal improvement since admission 10/17/2017 through 10/18/2017   -  Able to lie flat which is a good sign but normal ABG a moderately bad sign - Wheezy and not wanting to talk are not good signs - Suspect he nees more aggressive bronchodilatation  Plan - check lactic acid stat - increase neb support- with xopenex and atrovent q4h - increase steroid dose (was reduced earlier) - mag sulfate infusion x 1 -monitor closely - if worsens move to SDU for bipap     Dr. Kalman Shan, M.D., [facility].C.P Pulmonary and Critical Care Medicine Staff Physician [facility] Pulmonary and Critical Care Pager: [phone], If no answer or between  15:00h - 7:00h: call [phone]  10/18/2017 10:49 AM

## 2017-10-18 NOTE — Progress Notes (Signed)
Patient has improved with his breathing from this morning.  He still has some expiratory wheezing bilaterally while laying in bed, however he is able to talk with visitors and on the phone.  He still has significant shortness of breath and wheezing with minimal exertion.  Will continue to monitor.

## 2017-10-18 NOTE — Progress Notes (Signed)
Patient heart rate elevated 120s-130s not sustained when coughing or out of bed.  Heart rate usually 70s-80s.  Patient asymptomatic.  Will continue to monitor.

## 2017-10-18 NOTE — Progress Notes (Signed)
Patient complaining of increased wheezing.  Albuterol prn nebs ordered.  Patient and family says albuterol alone doesn't work and needs albuterol and atrovent together.  Respiratory made aware, and stated she couldn't change the order without doing an assessment first with albuterol.  Oxgyen saturation 95% on 2L.  Patient is having bilateral inspiratory and expiratory wheezing and states he is having chest tightness.  Dr. Patrick JupiterEmpkpae paged twice, states he will put in orders.  Will continue to monitor patient.

## 2017-10-18 NOTE — Progress Notes (Signed)
Patient had minimal relief after duoneb.  Still having inspiratory and expiratory wheezing bilaterally.  Patient oxygen saturation 93% room air.  Dr. Marchelle Gearingamaswamy on the unit and made aware.  Will continue to monitor patient.

## 2017-10-18 NOTE — Progress Notes (Signed)
CRITICAL VALUE ALERT  Critical Value:  Lactic Acid 3.9  Date & Time Notied:  10/18/17 at 1434  Provider Notified: Dr. Marchelle Gearingamaswamy  Orders Received/Actions taken: See orders

## 2017-10-18 NOTE — Assessment & Plan Note (Addendum)
Much better 10/19/2017 but not out of woods . In fact lactate is higher  Plan -continue aggressive neb - xopenex and atrovent q4h -continue higher solumedrol of 80mg  IV q6h - 500cc LR bolus -monitor closely - if worsens move to SDU for bipap - recheck lactat and CK 10/20/17

## 2017-10-19 LAB — HEPATIC FUNCTION PANEL
ALT: 29 U/L (ref 17–63)
AST: 26 U/L (ref 15–41)
Albumin: 3.8 g/dL (ref 3.5–5.0)
Alkaline Phosphatase: 66 U/L (ref 38–126)
BILIRUBIN TOTAL: 0.7 mg/dL (ref 0.3–1.2)
Total Protein: 6.9 g/dL (ref 6.5–8.1)

## 2017-10-19 LAB — CBC WITH DIFFERENTIAL/PLATELET
Abs Immature Granulocytes: 0.1 10*3/uL (ref 0.0–0.1)
BASOS ABS: 0 10*3/uL (ref 0.0–0.1)
BASOS PCT: 0 %
EOS PCT: 0 %
Eosinophils Absolute: 0 10*3/uL (ref 0.0–0.7)
HCT: 46.3 % (ref 39.0–52.0)
HEMOGLOBIN: 15.2 g/dL (ref 13.0–17.0)
Immature Granulocytes: 1 %
LYMPHS PCT: 9 %
Lymphs Abs: 1.5 10*3/uL (ref 0.7–4.0)
MCH: 30 pg (ref 26.0–34.0)
MCHC: 32.8 g/dL (ref 30.0–36.0)
MCV: 91.3 fL (ref 78.0–100.0)
Monocytes Absolute: 0.5 10*3/uL (ref 0.1–1.0)
Monocytes Relative: 3 %
NEUTROS ABS: 15.1 10*3/uL — AB (ref 1.7–7.7)
Neutrophils Relative %: 87 %
PLATELETS: 324 10*3/uL (ref 150–400)
RBC: 5.07 MIL/uL (ref 4.22–5.81)
RDW: 13.3 % (ref 11.5–15.5)
WBC: 17.2 10*3/uL — AB (ref 4.0–10.5)

## 2017-10-19 LAB — BASIC METABOLIC PANEL
Anion gap: 9 (ref 5–15)
BUN: 11 mg/dL (ref 6–20)
CHLORIDE: 100 mmol/L — AB (ref 101–111)
CO2: 28 mmol/L (ref 22–32)
CREATININE: 1.11 mg/dL (ref 0.61–1.24)
Calcium: 9.3 mg/dL (ref 8.9–10.3)
Glucose, Bld: 131 mg/dL — ABNORMAL HIGH (ref 65–99)
POTASSIUM: 5.1 mmol/L (ref 3.5–5.1)
Sodium: 137 mmol/L (ref 135–145)

## 2017-10-19 LAB — LACTIC ACID, PLASMA: LACTIC ACID, VENOUS: 3.7 mmol/L — AB (ref 0.5–1.9)

## 2017-10-19 LAB — PHOSPHORUS: PHOSPHORUS: 3.8 mg/dL (ref 2.5–4.6)

## 2017-10-19 LAB — GLUCOSE, CAPILLARY
GLUCOSE-CAPILLARY: 116 mg/dL — AB (ref 65–99)
Glucose-Capillary: 107 mg/dL — ABNORMAL HIGH (ref 65–99)
Glucose-Capillary: 109 mg/dL — ABNORMAL HIGH (ref 65–99)

## 2017-10-19 LAB — MAGNESIUM: MAGNESIUM: 2.2 mg/dL (ref 1.7–2.4)

## 2017-10-19 MED ORDER — PANTOPRAZOLE SODIUM 40 MG PO TBEC
40.0000 mg | DELAYED_RELEASE_TABLET | Freq: Every day | ORAL | Status: DC
  Administered 2017-10-20 – 2017-10-28 (×9): 40 mg via ORAL
  Filled 2017-10-19 (×9): qty 1

## 2017-10-19 MED ORDER — DIPHENHYDRAMINE HCL 25 MG PO CAPS
25.0000 mg | ORAL_CAPSULE | Freq: Every day | ORAL | Status: DC
  Administered 2017-10-19 – 2017-10-26 (×8): 25 mg via ORAL
  Filled 2017-10-19 (×8): qty 1

## 2017-10-19 MED ORDER — SODIUM CHLORIDE 0.9 % IV SOLN
INTRAVENOUS | Status: DC
  Administered 2017-10-19 – 2017-10-22 (×4): via INTRAVENOUS

## 2017-10-19 MED ORDER — LACTATED RINGERS IV BOLUS
500.0000 mL | Freq: Once | INTRAVENOUS | Status: AC
  Administered 2017-10-19: 500 mL via INTRAVENOUS

## 2017-10-19 MED ORDER — SODIUM CHLORIDE 0.9 % IV BOLUS
500.0000 mL | Freq: Once | INTRAVENOUS | Status: AC
  Administered 2017-10-19: 500 mL via INTRAVENOUS

## 2017-10-19 NOTE — Progress Notes (Signed)
CRITICAL VALUE ALERT  Critical Value:  Lactic acid of 3.7  Date & Time Notied:  10/19/17 at 0100  Provider Notified: On call provider via Amion  Orders Received/Actions taken: Hydrating with NS

## 2017-10-19 NOTE — Progress Notes (Signed)
Name: Jeralyn BennettRayshawn M Pekar MRN: [id number] DOB: 11/05/1985    ADMISSION DATE:  10/17/2017 CONSULTATION DATE:  10/17/2017  REFERRING MD :  Dr. Denton LankSteinl   CHIEF COMPLAINT:  Dyspnea  brief 32 year old male with PMH of  Asthma, Past smoker (reportley quiet March 2019). Recent Visit to ED on 4/12 with purulent rhinorrhea, given doxycycline and 4/22 for cough/shortness of breath given steroid taper and nebs   5/31 Presented to Pulmonary Office (Dr. Isaiah SergeMannam) with reported 3 days of dyspnea, white mucus production, and wheezing with noted hemoptysis. feno 95 . Given Severity of dyspnea and wheezing sent to ED for further work-up. Given 125 mg solu-medrol, magnesium 2 mg, albuterol 10 mg and 20 mg, in addition to Atrovent with minimal improvement. PCCM asked to consult.    SIGNIFICANT EVENTS  5/31 > Presents to ED   CXR 5/31 > The chest is hyperexpanded but the lungs are clear. No pneumothorax or pleural effusion. Heart size is normal. No acute bony abnormality.   10/18/17 - HIV and Urine TOx negative. Patient reports only marginally better. RN says he is unable to talk even on phone and very wheezy. Says albuterol not helping and wanting atrovent. Nebs just given without improvement.     SUBJECTIVE/OVERNIGHT/INTERVAL HX 10/19/17 =- reports significant improvement from a level 10/10 to 6/10/ RN affirms he is much better but gets very dyspneic and wheezy getting out of bed and coughs a lot. Class 3 dyspnea now as oposed to class 4. He does not want followup at [facility] anymore. W[facility] to be [facility]. Says running out of scheduled MDI was reason for flare up  VITAL SIGNS: Temp:  [97.8 F (36.6 C)-98 F (36.7 C)] 97.8 F (36.6 C) (06/02 0500) Pulse Rate:  [60-88] 60 (06/02 0500) Resp:  [16-20] 18 (06/02 0500) BP: (130-145)/(78-87) 130/78 (06/02 0500) SpO2:  [94 %-99 %] 95 % (06/02 0745) Weight:  [72.1 kg (158 lb 14.4 oz)] 72.1 kg (158 lb 14.4 oz) (06/02 0500)  PHYSICAL  EXAMINATION:  General Appearance:    Muscular thin male. In bed talking. Looks much better  Head:    Normocephalic, without obvious abnormality, atraumatic  Eyes:    PERRL - yes, conjunctiva/corneas - clear      Ears:    Normal external ear canals, both ears  Nose:   NG tube - no but has Hardy o2  Throat:  ETT TUBE - no , OG tube - no  Neck:   Supple,  No enlargement/tenderness/nodules     Lungs:     Diffuse wheezing + but better. Able to talk sentences +. Occ cough. No acc muscle use  Chest wall:    No deformity  Heart:    S1 and S2 normal, no murmur, CVP - no.  Pressors - no  Abdomen:     Soft, no masses, no organomegaly  Genitalia:    Not done  Rectal:   not done  Extremities:   Extremities- intact     Skin:   Intact in exposed areas . Tatoo +     Neurologic:   Sedation - none -> RASS - +1 . Moves all 4s - yes. CAM-ICU - neg . Orientation - x3 +         PULMONARY Recent Labs  Lab 10/18/17 0320  PHART 7.386  PCO2ART 42.3  PO2ART 78.5*  HCO3 24.8  O2SAT 95.0    CBC Recent Labs  Lab 10/17/17 1811 10/18/17 0719 10/19/17 0022  HGB  15.0 15.6 15.2  HCT 44.9 47.2 46.3  WBC 7.6 8.4 17.2*  PLT 267 296 324    COAGULATION No results for input(s): INR in the last 168 hours.  CARDIAC  No results for input(s): TROPONINI in the last 168 hours. No results for input(s): PROBNP in the last 168 hours.   CHEMISTRY Recent Labs  Lab 10/17/17 1156 10/17/17 1751 10/18/17 0719 10/19/17 0022  NA 140  --  136 137  K 3.7  --  4.9 5.1  CL 103  --  100* 100*  CO2 27  --  26 28  GLUCOSE 95  --  146* 131*  BUN <5*  --  6 11  CREATININE 0.89 0.98 0.93 1.11  CALCIUM 9.6  --  9.6 9.3  MG  --   --   --  2.2  PHOS  --   --   --  3.8   Estimated Creatinine Clearance: 96.5 mL/min (by C-G formula based on SCr of 1.11 mg/dL).   LIVER Recent Labs  Lab 10/19/17 0022  AST 26  ALT 29  ALKPHOS 66  BILITOT 0.7  PROT 6.9  ALBUMIN 3.8     INFECTIOUS Recent Labs  Lab  10/18/17 1117 10/18/17 2111 10/19/17 0022  LATICACIDVEN 3.9* 3.2* 3.7*     ENDOCRINE CBG (last 3)  Recent Labs    10/18/17 1644 10/18/17 2049 10/19/17 0730  GLUCAP 120* 107* 116*         IMAGING x48h  - image(s) personally visualized  -   highlighted in bold Dg Chest 2 View  Result Date: 10/17/2017 CLINICAL DATA:  Cough EXAM: CHEST - 2 VIEW COMPARISON:  None. FINDINGS: Lungs are hyperexpanded. There is no edema or consolidation. Heart size and pulmonary vascularity are normal. No adenopathy. No evident bone lesions. IMPRESSION: Lungs hyperexpanded without edema or consolidation. Heart size normal. Electronically Signed   By: Bretta Bang III M.D.   On: 10/17/2017 12:29   Dg Chest Port 1 View  Result Date: 10/18/2017 CLINICAL DATA:  32 year old male with asthma exacerbation EXAM: PORTABLE CHEST 1 VIEW COMPARISON:  Prior chest x-ray 10/17/2017 FINDINGS: The lungs are clear and negative for focal airspace consolidation, pulmonary edema or suspicious pulmonary nodule. The degree of pulmonary hyperinflation is similar compared to prior. No pleural effusion or pneumothorax. Cardiac and mediastinal contours are within normal limits. No acute fracture or lytic or blastic osseous lesions. The visualized upper abdominal bowel gas pattern is unremarkable. IMPRESSION: 1. No acute cardiopulmonary process. 2. Similar degree of pulmonary hyperinflation. Electronically Signed   By: Malachy Moan M.D.   On: 10/18/2017 12:34   Dg Chest Portable 1 View  Result Date: 10/17/2017 CLINICAL DATA:  Shortness of breath today. EXAM: PORTABLE CHEST 1 VIEW COMPARISON:  PA and lateral chest 10/17/2017. FINDINGS: The chest is hyperexpanded but the lungs are clear. No pneumothorax or pleural effusion. Heart size is normal. No acute bony abnormality. IMPRESSION: Pulmonary hyperexpansion.  Lungs are clear. Electronically Signed   By: Drusilla Kanner M.D.   On: 10/17/2017 15:38     ASSESSMENT /  PLAN:  32 year old male with PMH of asthma presenting with an asthma exacerbation.  Was sent here from pulmonary office.  Discussed with PCCM-NP.   Principal Problem:   Respiratory distress Active Problems:   Severe persistent acute asthmatic bronchitis   Elevated blood pressure reading without diagnosis of hypertension   Status asthmaticus    Status asthmaticus Much better 10/19/2017 but not out of woods .  In fact lactate is higher  Plan -continue aggressive neb - xopenex and atrovent q4h -continue higher solumedrol of 80mg  IV q6h - 500cc LR bolus -monitor closely - if worsens move to SDU for bipap - recheck lactat and CK 10/20/17      Dr. Kalman Shan, M.D., [facility].C.P Pulmonary and Critical Care Medicine Staff Physician [facility] Pulmonary and Critical Care Pager: [phone], If no answer or between  15:00h - 7:00h: call [phone]  10/19/2017 10:48 AM

## 2017-10-19 NOTE — Progress Notes (Addendum)
Patient Demographics:    Shannon Knapp, is a 32 y.o. male, DOB - 01/07/1986, ZOX:[id number]  Admit date - 10/17/2017   Admitting Physician Lahoma Crockerheresa C Sheehan, MD  Outpatient Primary MD for the patient is Patient, No Pcp Per  LOS - 2   Chief Complaint  Patient presents with  . COPD        Subjective:    Shannon MachoRayshawn Burdo today has no fevers, no emesis,  No chest pain, shortness of breath is better, continues to have coughing spells from time to time with desaturations , dad and significant other at bedside, questions answered  Assessment  & Plan :    Principal Problem:   Respiratory distress Active Problems:   Severe persistent acute asthmatic bronchitis   Elevated blood pressure reading without diagnosis of hypertension   Status asthmaticus  Brief summary  32 y.o. male who is a reformed smoker (10 pack yrs-quit in March 2019) with medical history significant of  COPD Versus Asthma who was sent over from about his pulmonary's office for direct admission on 10/17/2017 due to respiratory distress, admitted with increased work of breathing and wheezing suspect asthmatic bronchitis, pulmonology service following   PlAn:- 1)Asthma exacerbation/severe persistent acute asthmatic bronchitis-- shortness of breath is better, continues to have coughing spells from time to time with desaturations,  continue Solu-Medrol as per pulmonologist, bronchodilators including albuterol and Atrovent pulmonary service added Xopenex on 10/18/2017, no evidence of pneumonia.  Patient received magnesium.  ABG looked reassuring , respiratory panel  Negative Lactic acidosis noted, IV fluids started.  On admission patient had no leukocytosis, his leukocytosis is most likely steroid-induced  2) elevated blood pressure--- no prior history of hypertension, patient is on steroids, hydralazine as needed as ordered  3)Gi  prophylaxis-give Protonix p.o. while on high-dose steroids  Code Status : full   Disposition Plan  : home  Consults  :  pulm  DVT Prophylaxis  :  Lovenox   Lab Results  Component Value Date   PLT 324 10/19/2017    Inpatient Medications  Scheduled Meds: . dextromethorphan-guaiFENesin  1 tablet Oral BID  . enoxaparin (LOVENOX) injection  40 mg Subcutaneous Q24H  . insulin aspart  0-9 Units Subcutaneous TID WC  . ipratropium  0.5 mg Nebulization Q4H  . levalbuterol  0.63 mg Nebulization Q4H  . levofloxacin  500 mg Oral Daily  . methylPREDNISolone (SOLU-MEDROL) injection  80 mg Intravenous Q6H  . protein supplement shake  11 oz Oral TID BM  . sodium chloride flush  3 mL Intravenous Q12H  . sodium chloride flush  3 mL Intravenous Q12H  . traMADol  100 mg Oral Q6H   Continuous Infusions: . sodium chloride    . sodium chloride 150 mL/hr at 10/19/17 1711   PRN Meds:.sodium chloride, acetaminophen **OR** acetaminophen, albuterol, hydrALAZINE, ondansetron **OR** ondansetron (ZOFRAN) IV, sodium chloride flush, traMADol    Anti-infectives (From admission, onward)   Start     Dose/Rate Route Frequency Ordered Stop   10/18/17 1800  levofloxacin (LEVAQUIN) tablet 750 mg  Status:  Discontinued     750 mg Oral Daily 10/18/17 1033 10/18/17 1039   10/18/17 1800  levofloxacin (LEVAQUIN) tablet 500 mg     500 mg Oral Daily 10/18/17 1039 10/22/17  1759   10/17/17 1800  levofloxacin (LEVAQUIN) IVPB 750 mg  Status:  Discontinued     750 mg 100 mL/hr over 90 Minutes Intravenous Every 24 hours 10/17/17 1620 10/18/17 1033        Objective:   Vitals:   10/19/17 0500 10/19/17 0745 10/19/17 1240 10/19/17 1527  BP: 130/78  (!) 135/93   Pulse: 60  86   Resp: 18  20   Temp: 97.8 F (36.6 C)  98.3 F (36.8 C)   TempSrc: Oral  Oral   SpO2: 94% 95% 100% 96%  Weight: 72.1 kg (158 lb 14.4 oz)     Height:        Wt Readings from Last 3 Encounters:  10/19/17 72.1 kg (158 lb 14.4 oz)    10/17/17 74 kg (163 lb 3.2 oz)  09/07/17 77.6 kg (171 lb)     Intake/Output Summary (Last 24 hours) at 10/19/2017 1752 Last data filed at 10/19/2017 1414 Gross per 24 hour  Intake 3792.91 ml  Output 2750 ml  Net 1042.91 ml     Physical Exam  Gen:- Awake Alert, able to speak in sentences  HEENT:- [place].AT, No sclera icterus Nose- [place] 2 L/min Neck-Supple Neck,No JVD,.  Lungs-improved by movement , scattered wheezes , no rales CV- S1, S2 normal Abd-  +ve B.Sounds, Abd Soft, No tenderness,    Extremity/Skin:- No  edema,   +ve pulses Psych-affect is appropriate, oriented x3 Neuro-no new focal deficits, no tremors   Data Review:   Micro Results Recent Results (from the past 240 hour(s))  Respiratory Panel by PCR     Status: None   Collection Time: 10/18/17 11:12 AM  Result Value Ref Range Status   Adenovirus NOT DETECTED NOT DETECTED Final   Coronavirus 229E NOT DETECTED NOT DETECTED Final   Coronavirus HKU1 NOT DETECTED NOT DETECTED Final   Coronavirus NL63 NOT DETECTED NOT DETECTED Final   Coronavirus OC43 NOT DETECTED NOT DETECTED Final   Metapneumovirus NOT DETECTED NOT DETECTED Final   Rhinovirus / Enterovirus NOT DETECTED NOT DETECTED Final   Influenza A NOT DETECTED NOT DETECTED Final   Influenza B NOT DETECTED NOT DETECTED Final   Parainfluenza Virus 1 NOT DETECTED NOT DETECTED Final   Parainfluenza Virus 2 NOT DETECTED NOT DETECTED Final   Parainfluenza Virus 3 NOT DETECTED NOT DETECTED Final   Parainfluenza Virus 4 NOT DETECTED NOT DETECTED Final   Respiratory Syncytial Virus NOT DETECTED NOT DETECTED Final   Bordetella pertussis NOT DETECTED NOT DETECTED Final   Chlamydophila pneumoniae NOT DETECTED NOT DETECTED Final   Mycoplasma pneumoniae NOT DETECTED NOT DETECTED Final    Comment: Performed at [facility], [address] [place], [place] [address]  Culture, sputum-assessment     Status: None   Collection Time: 10/18/17 11:23 AM  Result Value Ref Range  Status   Specimen Description SPUTUM  Final   Special Requests NONE  Final   Sputum evaluation   Final    THIS SPECIMEN IS ACCEPTABLE FOR SPUTUM CULTURE Performed at [facility], [address] [place], [place] [address]    Report Status 10/18/2017 FINAL  Final  Culture, respiratory (NON-Expectorated)     Status: None (Preliminary result)   Collection Time: 10/18/17 11:23 AM  Result Value Ref Range Status   Specimen Description SPUTUM  Final   Special Requests NONE Reflexed from [id number]  Final   Gram Stain   Final    RARE WBC PRESENT, PREDOMINANTLY PMN  FEW YEAST FEW GRAM POSITIVE COCCI IN CHAINS FEW GRAM POSITIVE RODS RARE GRAM NEGATIVE RODS    Culture   Final    CULTURE REINCUBATED FOR BETTER GROWTH Performed at [facility], [address], [place], [place] [address]    Report Status PENDING  Incomplete    Radiology Reports Dg Chest 2 View  Result Date: 10/17/2017 CLINICAL DATA:  Cough EXAM: CHEST - 2 VIEW COMPARISON:  None. FINDINGS: Lungs are hyperexpanded. There is no edema or consolidation. Heart size and pulmonary vascularity are normal. No adenopathy. No evident bone lesions. IMPRESSION: Lungs hyperexpanded without edema or consolidation. Heart size normal. Electronically Signed   By: Bretta Bang III M.D.   On: 10/17/2017 12:29   Dg Chest Port 1 View  Result Date: 10/18/2017 CLINICAL DATA:  32 year old male with asthma exacerbation EXAM: PORTABLE CHEST 1 VIEW COMPARISON:  Prior chest x-ray 10/17/2017 FINDINGS: The lungs are clear and negative for focal airspace consolidation, pulmonary edema or suspicious pulmonary nodule. The degree of pulmonary hyperinflation is similar compared to prior. No pleural effusion or pneumothorax. Cardiac and mediastinal contours are within normal limits. No acute fracture or lytic or blastic osseous lesions. The visualized upper abdominal bowel gas pattern is unremarkable. IMPRESSION: 1. No acute cardiopulmonary process. 2.  Similar degree of pulmonary hyperinflation. Electronically Signed   By: Malachy Moan M.D.   On: 10/18/2017 12:34   Dg Chest Portable 1 View  Result Date: 10/17/2017 CLINICAL DATA:  Shortness of breath today. EXAM: PORTABLE CHEST 1 VIEW COMPARISON:  PA and lateral chest 10/17/2017. FINDINGS: The chest is hyperexpanded but the lungs are clear. No pneumothorax or pleural effusion. Heart size is normal. No acute bony abnormality. IMPRESSION: Pulmonary hyperexpansion.  Lungs are clear. Electronically Signed   By: Drusilla Kanner M.D.   On: 10/17/2017 15:38     CBC Recent Labs  Lab 10/17/17 1156 10/17/17 1811 10/18/17 0719 10/19/17 0022  WBC 7.9 7.6 8.4 17.2*  HGB 15.4 15.0 15.6 15.2  HCT 45.7 44.9 47.2 46.3  PLT 302 267 296 324  MCV 90.3 90.5 90.9 91.3  MCH 30.4 30.2 30.1 30.0  MCHC 33.7 33.4 33.1 32.8  RDW 13.3 13.2 13.2 13.3  LYMPHSABS  --  1.2 1.5 1.5  MONOABS  --  0.2 0.2 0.5  EOSABS  --  0.2 0.2 0.0  BASOSABS  --  0.0 0.0 0.0    Chemistries  Recent Labs  Lab 10/17/17 1156 10/17/17 1751 10/18/17 0719 10/19/17 0022  NA 140  --  136 137  K 3.7  --  4.9 5.1  CL 103  --  100* 100*  CO2 27  --  26 28  GLUCOSE 95  --  146* 131*  BUN <5*  --  6 11  CREATININE 0.89 0.98 0.93 1.11  CALCIUM 9.6  --  9.6 9.3  MG  --   --   --  2.2  AST  --   --   --  26  ALT  --   --   --  29  ALKPHOS  --   --   --  66  BILITOT  --   --   --  0.7   ------------------------------------------------------------------------------------------------------------------ No results for input(s): CHOL, HDL, LDLCALC, TRIG, CHOLHDL, LDLDIRECT in the last 72 hours.  No results found for: HGBA1C ------------------------------------------------------------------------------------------------------------------ Recent Labs    10/17/17 1753  TSH 0.359   ------------------------------------------------------------------------------------------------------------------ No results for input(s):  VITAMINB12, FOLATE, FERRITIN, TIBC, IRON, RETICCTPCT in  the last 72 hours.  Coagulation profile No results for input(s): INR, PROTIME in the last 168 hours.  No results for input(s): DDIMER in the last 72 hours.  Cardiac Enzymes No results for input(s): CKMB, TROPONINI, MYOGLOBIN in the last 168 hours.  Invalid input(s): CK ------------------------------------------------------------------------------------------------------------------ No results found for: BNP   Shon Hale M.D on 10/19/2017 at 5:52 PM  Between 7am to 7pm - Pager - [phone]  After 7pm go to [email or web address] - password [email or web address]  [facility] -  Office  [phone]   Voice Recognition Reubin [place] dictation system was used to create this note, attempts have been made to correct errors. Please contact the author with questions and/or clarifications.

## 2017-10-20 DIAGNOSIS — J45901 Unspecified asthma with (acute) exacerbation: Secondary | ICD-10-CM

## 2017-10-20 DIAGNOSIS — E43 Unspecified severe protein-calorie malnutrition: Secondary | ICD-10-CM | POA: Diagnosis present

## 2017-10-20 DIAGNOSIS — J4551 Severe persistent asthma with (acute) exacerbation: Principal | ICD-10-CM

## 2017-10-20 DIAGNOSIS — R0603 Acute respiratory distress: Secondary | ICD-10-CM

## 2017-10-20 DIAGNOSIS — J4550 Severe persistent asthma, uncomplicated: Secondary | ICD-10-CM

## 2017-10-20 DIAGNOSIS — J9601 Acute respiratory failure with hypoxia: Secondary | ICD-10-CM | POA: Diagnosis present

## 2017-10-20 LAB — CBC WITH DIFFERENTIAL/PLATELET
Abs Immature Granulocytes: 0 10*3/uL (ref 0.0–0.1)
BASOS PCT: 0 %
Basophils Absolute: 0 10*3/uL (ref 0.0–0.1)
EOS ABS: 0 10*3/uL (ref 0.0–0.7)
EOS PCT: 0 %
HEMATOCRIT: 46.2 % (ref 39.0–52.0)
Hemoglobin: 15.3 g/dL (ref 13.0–17.0)
Immature Granulocytes: 0 %
LYMPHS ABS: 1.2 10*3/uL (ref 0.7–4.0)
Lymphocytes Relative: 8 %
MCH: 30.4 pg (ref 26.0–34.0)
MCHC: 33.1 g/dL (ref 30.0–36.0)
MCV: 91.8 fL (ref 78.0–100.0)
MONO ABS: 0.5 10*3/uL (ref 0.1–1.0)
MONOS PCT: 3 %
Neutro Abs: 13.3 10*3/uL — ABNORMAL HIGH (ref 1.7–7.7)
Neutrophils Relative %: 89 %
PLATELETS: 327 10*3/uL (ref 150–400)
RBC: 5.03 MIL/uL (ref 4.22–5.81)
RDW: 13.2 % (ref 11.5–15.5)
WBC: 14.9 10*3/uL — ABNORMAL HIGH (ref 4.0–10.5)

## 2017-10-20 LAB — GLUCOSE, CAPILLARY
GLUCOSE-CAPILLARY: 108 mg/dL — AB (ref 65–99)
GLUCOSE-CAPILLARY: 113 mg/dL — AB (ref 65–99)
Glucose-Capillary: 95 mg/dL (ref 65–99)
Glucose-Capillary: 122 mg/dL — ABNORMAL HIGH (ref 65–99)
Glucose-Capillary: 111 mg/dL — ABNORMAL HIGH (ref 65–99)

## 2017-10-20 LAB — CULTURE, RESPIRATORY W GRAM STAIN: Culture: NORMAL

## 2017-10-20 LAB — CK TOTAL AND CKMB (NOT AT ARMC)
CK TOTAL: 285 U/L (ref 49–397)
CK, MB: 7.8 ng/mL — ABNORMAL HIGH (ref 0.5–5.0)
Relative Index: 2.7 — ABNORMAL HIGH (ref 0.0–2.5)

## 2017-10-20 LAB — LACTIC ACID, PLASMA: Lactic Acid, Venous: 4.2 mmol/L (ref 0.5–1.9)

## 2017-10-20 LAB — PHOSPHORUS: Phosphorus: 3 mg/dL (ref 2.5–4.6)

## 2017-10-20 LAB — MAGNESIUM: Magnesium: 2.4 mg/dL (ref 1.7–2.4)

## 2017-10-20 LAB — CULTURE, RESPIRATORY

## 2017-10-20 MED ORDER — METHYLPREDNISOLONE SODIUM SUCC 40 MG IJ SOLR
40.0000 mg | Freq: Four times a day (QID) | INTRAMUSCULAR | Status: DC
  Administered 2017-10-20 – 2017-10-26 (×24): 40 mg via INTRAVENOUS
  Filled 2017-10-20 (×24): qty 1

## 2017-10-20 MED ORDER — MOMETASONE FURO-FORMOTEROL FUM 200-5 MCG/ACT IN AERO
2.0000 | INHALATION_SPRAY | Freq: Two times a day (BID) | RESPIRATORY_TRACT | Status: DC
  Administered 2017-10-21 – 2017-10-30 (×19): 2 via RESPIRATORY_TRACT
  Filled 2017-10-20 (×2): qty 8.8

## 2017-10-20 MED ORDER — ALBUTEROL SULFATE (2.5 MG/3ML) 0.083% IN NEBU: 2.5000 mg | INHALATION_SOLUTION | RESPIRATORY_TRACT | Status: DC | PRN

## 2017-10-20 MED ORDER — BENZONATATE 100 MG PO CAPS
100.0000 mg | ORAL_CAPSULE | Freq: Three times a day (TID) | ORAL | Status: DC
  Administered 2017-10-20 – 2017-10-21 (×5): 100 mg via ORAL
  Filled 2017-10-20 (×5): qty 1

## 2017-10-20 MED ORDER — SODIUM CHLORIDE 0.9 % IV BOLUS
500.0000 mL | Freq: Once | INTRAVENOUS | Status: AC
  Administered 2017-10-20: 500 mL via INTRAVENOUS

## 2017-10-20 NOTE — Progress Notes (Signed)
Patient's sats have remained between 95-99% since changing his oxygen probe. He's connected to a continuous pulse ox. Will continue to monitor for patient's well being.

## 2017-10-20 NOTE — Progress Notes (Signed)
Patient Demographics:    Shannon MachoRayshawn Twardowski, is a 32 y.o. male, DOB - 05/03/1986, ZOX:[id number]  Admit date - 10/17/2017   Admitting Physician Lahoma Crockerheresa C Sheehan, MD  Outpatient Primary MD for the patient is Patient, No Pcp Per  LOS - 3   Chief Complaint  Patient presents with  . COPD        Subjective:    Shannon Machoayshawn Shannon Knapp - having coughing spasms after taking neb Rx's and has coughed up some small amount of blood/ blood-tinged mucous/ saliva.  No clots/ heavy bleeding. Otherwise no new c/o, no fevers or chills no purulent sputum.    Assessment  & Plan :    Principal Problem:   Respiratory distress Active Problems:   Severe persistent acute asthmatic bronchitis   Elevated blood pressure reading without diagnosis of hypertension   Status asthmaticus   Protein-calorie malnutrition, severe  Brief summary  32 y.o. male who is a reformed smoker (10 pack yrs-quit in March 2019) with medical history significant of  COPD Versus Asthma who was sent over from about his pulmonary's office for direct admission on 10/17/2017 due to respiratory distress, admitted with increased work of breathing and wheezing suspect asthmatic bronchitis, pulmonology service following   Plan:- 1) Asthma exacerbation/severe persistent acute asthmatic bronchitis- seems to be making slow progress - getting Solu-Medrol 80 mg IV every 6 hrs, per CCM - cont  bronchodilators including albuterol and Atrovent  - pulmonary service added Xopenex - getting levaquin po 500 mg qd through 6/5 - CXR 10/18/2017, no evidence of pneumonia, ABG looked reassuring , respiratory panel neg  - patient received IV magnesium - Lactic acidosis noted, IV fluids - admission patient had no leukocytosis, his leukocytosis is most likely steroid-induced - nebulizer Rx's may be inducing coughing spells? no serious hemoptysis, pt reassured  2) elevated blood  pressure--- no prior history of hypertension, patient is on steroids, hydralazine as needed as ordered  3) Gi prophylaxis-give Protonix p.o. while on high-dose steroids  4) Hyperglycemia - mild due to steroids, nothing > 150 will dc SSI/ accuchecks    Vinson Moselleob Uriel Horkey MD [facility] pgr [phone] 10/12/2017, 9:25 AM     Code Status : full  Disposition Plan  : home Consults  :  pulm DVT Prophylaxis  :  Lovenox   Lab Results  Component Value Date   PLT 327 10/20/2017    Inpatient Medications  Scheduled Meds: . dextromethorphan-guaiFENesin  1 tablet Oral BID  . diphenhydrAMINE  25 mg Oral QHS  . enoxaparin (LOVENOX) injection  40 mg Subcutaneous Q24H  . insulin aspart  0-9 Units Subcutaneous TID WC  . ipratropium  0.5 mg Nebulization Q4H  . levalbuterol  0.63 mg Nebulization Q4H  . levofloxacin  500 mg Oral Daily  . methylPREDNISolone (SOLU-MEDROL) injection  80 mg Intravenous Q6H  . pantoprazole  40 mg Oral Daily  . protein supplement shake  11 oz Oral TID BM  . sodium chloride flush  3 mL Intravenous Q12H  . sodium chloride flush  3 mL Intravenous Q12H  . traMADol  100 mg Oral Q6H   Continuous Infusions: . sodium chloride    . sodium chloride 150 mL/hr at 10/20/17 0846   PRN Meds:.sodium chloride, acetaminophen **OR**  acetaminophen, albuterol, hydrALAZINE, ondansetron **OR** ondansetron (ZOFRAN) IV, sodium chloride flush, traMADol    Anti-infectives (From admission, onward)   Start     Dose/Rate Route Frequency Ordered Stop   10/18/17 1800  levofloxacin (LEVAQUIN) tablet 750 mg  Status:  Discontinued     750 mg Oral Daily 10/18/17 1033 10/18/17 1039   10/18/17 1800  levofloxacin (LEVAQUIN) tablet 500 mg     500 mg Oral Daily 10/18/17 1039 10/22/17 1759   10/17/17 1800  levofloxacin (LEVAQUIN) IVPB 750 mg  Status:  Discontinued     750 mg 100 mL/hr over 90 Minutes Intravenous Every 24 hours 10/17/17 1620 10/18/17 1033        Objective:    Vitals:   10/20/17 0448 10/20/17 0731 10/20/17 0900 10/20/17 1146  BP: (!) 150/93  129/72   Pulse: 86  85   Resp: 20  18   Temp: 98 F (36.7 C)  98.8 F (37.1 C)   TempSrc: Oral  Oral   SpO2: 100% 95% 100% 98%  Weight: 71.9 kg (158 lb 8 oz)     Height:        Wt Readings from Last 3 Encounters:  10/20/17 71.9 kg (158 lb 8 oz)  10/17/17 74 kg (163 lb 3.2 oz)  09/07/17 77.6 kg (171 lb)     Intake/Output Summary (Last 24 hours) at 10/20/2017 1151 Last data filed at 10/20/2017 1043 Gross per 24 hour  Intake 5214.7 ml  Output 3600 ml  Net 1614.7 ml     Physical Exam  Gen:- Awake Alert, able to speak in sentences  HEENT:- [place].AT, No sclera icterus Nose- [place] 2 L/min Neck-Supple Neck,No JVD,.  Lungs- bilat exp coarse wheezing of moderate severity, no use of accessory muscles at rest, gets a little dyspneic when talking after a sig period of time CV- S1, S2 normal Abd-  +ve B.Sounds, Abd Soft, No tenderness,    Extremity/Skin:- No  edema,   +ve pulses Psych-affect is appropriate, oriented x3 Neuro-no new focal deficits, no tremors   Data Review:   CBC Recent Labs  Lab 10/17/17 1156 10/17/17 1811 10/18/17 0719 10/19/17 0022 10/20/17 0048  WBC 7.9 7.6 8.4 17.2* 14.9*  HGB 15.4 15.0 15.6 15.2 15.3  HCT 45.7 44.9 47.2 46.3 46.2  PLT 302 267 296 324 327  MCV 90.3 90.5 90.9 91.3 91.8  MCH 30.4 30.2 30.1 30.0 30.4  MCHC 33.7 33.4 33.1 32.8 33.1  RDW 13.3 13.2 13.2 13.3 13.2  LYMPHSABS  --  1.2 1.5 1.5 1.2  MONOABS  --  0.2 0.2 0.5 0.5  EOSABS  --  0.2 0.2 0.0 0.0  BASOSABS  --  0.0 0.0 0.0 0.0    Chemistries  Recent Labs  Lab 10/17/17 1156 10/17/17 1751 10/18/17 0719 10/19/17 0022 10/20/17 0048  NA 140  --  136 137  --   K 3.7  --  4.9 5.1  --   CL 103  --  100* 100*  --   CO2 27  --  26 28  --   GLUCOSE 95  --  146* 131*  --   BUN <5*  --  6 11  --   CREATININE 0.89 0.98 0.93 1.11  --   CALCIUM 9.6  --  9.6 9.3  --   MG  --   --   --  2.2 2.4  AST   --   --   --  26  --   ALT  --   --   --  29  --   ALKPHOS  --   --   --  66  --   BILITOT  --   --   --  0.7  --    ------------------------------------------------------------------------------------------------------------------ No results for input(s): CHOL, HDL, LDLCALC, TRIG, CHOLHDL, LDLDIRECT in the last 72 hours.  No results found for: HGBA1C ------------------------------------------------------------------------------------------------------------------ Recent Labs    10/17/17 1753  TSH 0.359   ------------------------------------------------------------------------------------------------------------------ No results for input(s): VITAMINB12, FOLATE, FERRITIN, TIBC, IRON, RETICCTPCT in the last 72 hours.  Coagulation profile No results for input(s): INR, PROTIME in the last 168 hours.  No results for input(s): DDIMER in the last 72 hours.  Cardiac Enzymes Recent Labs  Lab 10/20/17 0048  CKMB 7.8*   ------------------------------------------------------------------------------------------------------------------ No results found for: BNP  Between 7am to 7pm - Pager - [phone]  After 7pm go to [email or web address] - password [facility]  [facility] -  Office  [phone]

## 2017-10-20 NOTE — Progress Notes (Signed)
Notified Md Bodenheimer of the patient's critical lactic acid 4.2. Awaiting a response.

## 2017-10-20 NOTE — Progress Notes (Signed)
Name: Shannon Knapp MRN: [id number] DOB: 1985-10-22    ADMISSION DATE:  10/17/2017 CONSULTATION DATE:  10/17/2017  REFERRING MD :  Dr. Denton Lank   CHIEF COMPLAINT:  Dyspnea  brief 32 year old male with PMH of  Asthma, Past smoker (reportley quiet March 2019). Recent Visit to ED on 4/12 with purulent rhinorrhea, given doxycycline and 4/22 for cough/shortness of breath given steroid taper and nebs   5/31 Presented to Pulmonary Office (Dr. Isaiah Serge) with reported 3 days of dyspnea, white mucus production, and wheezing with noted hemoptysis. feno 95 . Given Severity of dyspnea and wheezing sent to ED for further work-up. Given 125 mg solu-medrol, magnesium 2 mg, albuterol 10 mg and 20 mg, in addition to Atrovent with minimal improvement. PCCM asked to consult.    SIGNIFICANT EVENTS  5/31 > Presents to ED   CXR 5/31 > The chest is hyperexpanded but the lungs are clear. No pneumothorax or pleural effusion. Heart size is normal. No acute bony abnormality.   10/18/17 - HIV and Urine TOx negative. Patient reports only marginally better. RN says he is unable to talk even on phone and very wheezy. Says albuterol not helping and wanting atrovent. Nebs just given without improvement.     SUBJECTIVE/OVERNIGHT/INTERVAL HX No events overnight, no new complaints, feeling better  VITAL SIGNS: Temp:  [98 F (36.7 C)-98.8 F (37.1 C)] 98.8 F (37.1 C) (06/03 0900) Pulse Rate:  [85-108] 85 (06/03 0900) Resp:  [18-20] 18 (06/03 0900) BP: (129-157)/(72-93) 129/72 (06/03 0900) SpO2:  [95 %-100 %] 98 % (06/03 1146) Weight:  [158 lb 8 oz (71.9 kg)] 158 lb 8 oz (71.9 kg) (06/03 0448)  PHYSICAL EXAMINATION:  General:  Chronically ill appearing male, NAD Neuro:  Alert and interactive, moving all ext to command HEENT:  [place]/AT, PERRL, EOM-I and MMM Cardiovascular:  RRR, Nl S1/S2 and -M/R/G Lungs:  Diffuse expiratory wheezes Abdomen:  Soft, NT, ND and +BS Musculoskeletal:  -edema and  -tenderness Skin:  Intact  PULMONARY Recent Labs  Lab 10/18/17 0320  PHART 7.386  PCO2ART 42.3  PO2ART 78.5*  HCO3 24.8  O2SAT 95.0    CBC Recent Labs  Lab 10/18/17 0719 10/19/17 0022 10/20/17 0048  HGB 15.6 15.2 15.3  HCT 47.2 46.3 46.2  WBC 8.4 17.2* 14.9*  PLT 296 324 327    COAGULATION No results for input(s): INR in the last 168 hours.  CARDIAC  No results for input(s): TROPONINI in the last 168 hours. No results for input(s): PROBNP in the last 168 hours.   CHEMISTRY Recent Labs  Lab 10/17/17 1156 10/17/17 1751 10/18/17 0719 10/19/17 0022 10/20/17 0048  NA 140  --  136 137  --   K 3.7  --  4.9 5.1  --   CL 103  --  100* 100*  --   CO2 27  --  26 28  --   GLUCOSE 95  --  146* 131*  --   BUN <5*  --  6 11  --   CREATININE 0.89 0.98 0.93 1.11  --   CALCIUM 9.6  --  9.6 9.3  --   MG  --   --   --  2.2 2.4  PHOS  --   --   --  3.8 3.0   Estimated Creatinine Clearance: 96.3 mL/min (by C-G formula based on SCr of 1.11 mg/dL).   LIVER Recent Labs  Lab 10/19/17 0022  AST 26  ALT 29  ALKPHOS 66  BILITOT 0.7  PROT 6.9  ALBUMIN 3.8     INFECTIOUS Recent Labs  Lab 10/18/17 2111 10/19/17 0022 10/20/17 0048  LATICACIDVEN 3.2* 3.7* 4.2*     ENDOCRINE CBG (last 3)  Recent Labs    10/19/17 2138 10/20/17 0832 10/20/17 1131  GLUCAP 109* 113* 95         IMAGING x48h  - image(s) personally visualized  -   highlighted in bold I reviewed CXR myself, hyperinflation noted  ASSESSMENT / PLAN:  32 year old male with PMH of asthma presenting with an asthma exacerbation.  Was sent here from pulmonary office.  Discussed with PCCM-NP.   Principal Problem:   Severe persistent acute asthmatic bronchitis Active Problems:   Elevated blood pressure reading without diagnosis of hypertension   Respiratory distress   Status asthmaticus   Protein-calorie malnutrition, severe  Asthma:             - Decrease solumedrol to 40 mg IV q6              - Finish a 5 day course of levaquin             - Mucinex             - Xopenex given tachycardia  - Will add advair 500/50  - F/U with Dr. Isaiah SergeMannam on 11/12/2017 at 9:45 AM  Allergies:             - Claritin  - Benadryl  Tachycardia             - Maintain on xopenex and will need that added to his OP profile  Hypoxemia:             - Titrate O2 for sat of 92-95%  PCCM will follow  Alyson ReedyWesam G. Calieb Lichtman, M.D. [facility]. Pager: [phone]. After hours pager: [phone].  10/20/2017 12:34 PM

## 2017-10-20 NOTE — Progress Notes (Signed)
Visited with patient regarding HCPOA.  Patient wants to make girlfriend/fiance' HCPOA because none of his other family members have taken care of him in the past, he states.  Provided education around paperwork.  Patient states he will get a bath and complete paperwork and notify nurse to reach out to our office for completion.  Chaplains are happy to help with completion.    10/20/17 0947  Clinical Encounter Type  Visited With Patient;Family (girlfriend)  Visit Type Initial;Spiritual support  Spiritual Encounters  Spiritual Needs Literature (HCPOA)

## 2017-10-20 NOTE — Significant Event (Addendum)
Rapid Response Event Note  Overview: Called: 0025 Arrived: 0028 Context of Call: Respiratory distress after walking back from BR  Initial Focused Assessment: I was called by the nursing staff because the patient was in distress after walking back from the bathroom and no relief from distress with nebulizer treatment.  Upon my arrival, [name] was sitting on the side of the bed, alert,  holding his nebulizer and taking the mask off his face.  He appears in significant distress with core accessory muscle use as well as nasal flaring.  He is tachypneic in the 30s with audible wheeze without auscultation. Skin is pink and warm. Pulse oximetry reading is ranging from 85% with the mask to his face to 65% when he removes the mask.  BBS he has decent air movement throughout but expiratory wheezes in all lung fields.  He also has a dry hacking cough.  [name] holds his pointer finger up in the air (signing because he cannot speak due to distress) when he is asked to place the mask back on his face because there was still nebulized medicine spraying out of the mask to help him.  He then stated as he placed his hands on his head and tried purse lipped breathing that it was not working and he was told to try to manage his asthma without oxygen (speaking one word bursts in between breaths).  I told him he was right to try to manage without oxygen but not while in acute crisis.  I instructed he needed his oxygen and medicine during episodes of crisis to help.  He refused.  I attempted to negotiate with him to accept the oxygen and he refused.  He was able to bring his pulse oximetry up to 85-88% with just his relaxation and deep breathing he was doing.  Reportedly he ambulates to the BR without oxygen and has hypoxic episodes that take him a little while to recover from after his sats drop to as low as 60s.   Interventions: -Nebulizer treatment -CDB with purse lipped breathing  -Reinforce importance of  compliance with POC (I.e. O2, meds, walking with o2) -Notify primary svc of events (done)   Plan of Care (if not transferred): -Continued attempts to educate patient in regards to activity and medical compliance -prevent hypoxic episodes with using oxygen during any activity  Event Summary: Call ended 0030  Follow up:  When rounding later, I [facility] and she checked on the patient. He was asleep, Sats 98% on 2LNC.   Shannon Knapp, Shannon Knapp

## 2017-10-20 NOTE — Progress Notes (Signed)
Patient experienced desaturation into the 60's after going to the restroom. Sustained for  several minutes. Respiratory therapy was present to administer patient's nebs treatment. She requested RR to be paged to come and assess the patient. Valentina Lucksave RR RN came to assess. Patient requested a few moments to self control his breathing before placing on his oxygen. He had almost fully completed his nebs treatment. Refused the final seconds due to extreme difficulty breathing and elevated hear trate. Patient complained about the pulse ox and wanted a new one placed. When replaced patient's oxygen Sats improved immediately to 95-99% and has not alarmed since. It's been greater than 10 minutes. Explained the importance of keeping the oxygen on to help with oxygenation and heart rate. Patient agreed to wear it but felt he was being forced to wear the oxygen and nebs despite attempting to first gather his breath. He explained that he isn't being noncompliant but knows his body and wants to help assist with recovering from the effects of the neb treatment.  Will continue to monitor patient for safety.

## 2017-10-20 NOTE — Plan of Care (Signed)
Patient is scheduled for a pulmonary function test later today. Hopefully the results will assist with providing the best care for patient's prognosis. Patient remained complaint with oxygen therapy. He is concerned about the effects of receiving neb treatments. He has a persistent aggressive cough along with the usage of his abdominal muscle to assist with breathing. Once his oxygen probe was replaced with a new one, he remained at 95-100% on 3 liters of oxygen. Continued to monitor for patient safety and comfort.
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
## 2017-10-20 NOTE — Progress Notes (Signed)
Completed HCPOA, patient made girlfriend/fiance' medical HCPOA.  Copy placed on the medical chart and the original copy given to the patient.    10/20/17 1519  Clinical Encounter Type  Visited With Patient and family together  Visit Type Follow-up;Spiritual support  Advance Directives (For Healthcare)  Does Patient Have a Medical Advance Directive? Yes  Type of Estate agentAdvance Directive Healthcare Power of [place]

## 2017-10-21 ENCOUNTER — Inpatient Hospital Stay (HOSPITAL_COMMUNITY): Payer: Self-pay

## 2017-10-21 DIAGNOSIS — R059 Cough, unspecified: Secondary | ICD-10-CM

## 2017-10-21 DIAGNOSIS — R05 Cough: Secondary | ICD-10-CM

## 2017-10-21 DIAGNOSIS — R042 Hemoptysis: Secondary | ICD-10-CM

## 2017-10-21 LAB — BASIC METABOLIC PANEL
ANION GAP: 6 (ref 5–15)
BUN: 17 mg/dL (ref 6–20)
CALCIUM: 9.3 mg/dL (ref 8.9–10.3)
CO2: 30 mmol/L (ref 22–32)
Chloride: 98 mmol/L — ABNORMAL LOW (ref 101–111)
Creatinine, Ser: 0.75 mg/dL (ref 0.61–1.24)
Glucose, Bld: 121 mg/dL — ABNORMAL HIGH (ref 65–99)
Potassium: 5.1 mmol/L (ref 3.5–5.1)
Sodium: 134 mmol/L — ABNORMAL LOW (ref 135–145)

## 2017-10-21 LAB — CBC WITH DIFFERENTIAL/PLATELET
Abs Immature Granulocytes: 0.1 10*3/uL (ref 0.0–0.1)
Basophils Absolute: 0 10*3/uL (ref 0.0–0.1)
Basophils Relative: 0 %
Eosinophils Absolute: 0 10*3/uL (ref 0.0–0.7)
Eosinophils Relative: 0 %
HCT: 43.5 % (ref 39.0–52.0)
Hemoglobin: 14.6 g/dL (ref 13.0–17.0)
Immature Granulocytes: 1 %
Lymphocytes Relative: 11 %
Lymphs Abs: 1.5 10*3/uL (ref 0.7–4.0)
MCH: 30.5 pg (ref 26.0–34.0)
MCHC: 33.6 g/dL (ref 30.0–36.0)
MCV: 90.8 fL (ref 78.0–100.0)
Monocytes Absolute: 0.7 10*3/uL (ref 0.1–1.0)
Monocytes Relative: 5 %
Neutro Abs: 10.9 10*3/uL — ABNORMAL HIGH (ref 1.7–7.7)
Neutrophils Relative %: 83 %
Platelets: 265 10*3/uL (ref 150–400)
RBC: 4.79 MIL/uL (ref 4.22–5.81)
RDW: 12.9 % (ref 11.5–15.5)
WBC: 13.2 10*3/uL — ABNORMAL HIGH (ref 4.0–10.5)

## 2017-10-21 MED ORDER — LEVALBUTEROL HCL 0.63 MG/3ML IN NEBU: 0.6300 mg | INHALATION_SOLUTION | Freq: Three times a day (TID) | RESPIRATORY_TRACT | Status: DC | PRN

## 2017-10-21 MED ORDER — HYDROCODONE-HOMATROPINE 5-1.5 MG/5ML PO SYRP
5.0000 mL | ORAL_SOLUTION | ORAL | Status: AC | PRN
  Administered 2017-10-21 – 2017-10-23 (×6): 5 mL via ORAL
  Filled 2017-10-21 (×6): qty 5

## 2017-10-21 MED ORDER — LEVALBUTEROL TARTRATE 45 MCG/ACT IN AERO: 1.0000 | INHALATION_SPRAY | Freq: Three times a day (TID) | RESPIRATORY_TRACT | Status: DC | PRN

## 2017-10-21 MED ORDER — GUAIFENESIN-DM 100-10 MG/5ML PO SYRP
5.0000 mL | ORAL_SOLUTION | ORAL | Status: DC | PRN
  Administered 2017-10-21: 5 mL via ORAL
  Filled 2017-10-21: qty 5

## 2017-10-21 NOTE — Progress Notes (Signed)
Patient Demographics:    Shannon Knapp, is a 32 y.o. male, DOB - 04/29/1986, ZOX:[id number]  Admit date - 10/17/2017   Admitting Physician Lahoma Crocker, MD  Outpatient Primary MD for the patient is Patient, No Pcp Per  LOS - 4   Chief Complaint  Patient presents with  . COPD        Subjective:    Shannon Knapp - still w coughing spells but not coughing off much blood last night (one time small amt), breathing still short off and on    Assessment  & Plan :    Principal Problem:   Severe persistent acute asthmatic bronchitis Active Problems:   Elevated blood pressure reading without diagnosis of hypertension   Respiratory distress   Status asthmaticus   Protein-calorie malnutrition, severe   Acute respiratory failure with hypoxemia (HCC)   Acute respiratory distress   Asthma exacerbation  Brief summary  32 y.o. male who is a reformed smoker (10 pack yrs-quit in March 2019) with medical history significant of  COPD Versus Asthma who was sent over from about his pulmonary's office for direct admission on 10/17/2017 due to respiratory distress, admitted with increased work of breathing and wheezing suspect asthmatic bronchitis, pulmonology service following   Plan:- 1) Asthma exacerbation/severe persistent acute asthmatic bronchitis- appreciate pulm service assistance - getting Solu-Medrol 40 IV every 6 hrs, do not decrease per pulm - will wean oxygen as able to RA - pulmonary service added Xopenex - continue abx (levaquin) as ordered for 5 days through 6/5 - CXR 10/18/2017, no evidence of pneumonia, ABG looked reassuring , respiratory panel neg  - patient received IV magnesium, f/u serum Mg tomorrow per CCM - admission patient had no leukocytosis, leukocytosis is most likely steroid-induced - mucinex and tessalon scheduled - hycodan prn for cough - dulera 2 puffs daily (LABA/ ICS)  per CCM - due to tachycardia avoiding albuterol > use xopenex nebs prn for wheezing  2) elevated blood pressure--- no prior history of hypertension, patient is on steroids, hydralazine as needed is ordered   3) Gi prophylaxis-give Protonix p.o. while on high-dose steroids  4) Hyperglycemia - mild due to steroids, nothing > 150 , have dc'd SSI/ accuchecks    Shannon Moselle MD [facility] pgr [phone] 10/12/2017, 9:25 AM     Code Status : full  Disposition Plan  : home Consults  :  pulm DVT Prophylaxis  :  Lovenox   Lab Results  Component Value Date   PLT 265 10/21/2017    Inpatient Medications  Scheduled Meds: . benzonatate  100 mg Oral TID  . dextromethorphan-guaiFENesin  1 tablet Oral BID  . diphenhydrAMINE  25 mg Oral QHS  . enoxaparin (LOVENOX) injection  40 mg Subcutaneous Q24H  . levofloxacin  500 mg Oral Daily  . methylPREDNISolone (SOLU-MEDROL) injection  40 mg Intravenous Q6H  . mometasone-formoterol  2 puff Inhalation BID  . pantoprazole  40 mg Oral Daily  . protein supplement shake  11 oz Oral TID BM  . sodium chloride flush  3 mL Intravenous Q12H  . sodium chloride flush  3 mL Intravenous Q12H  . traMADol  100 mg Oral Q6H   Continuous Infusions: . sodium chloride    .  sodium chloride 75 mL/hr at 10/20/17 1250   PRN Meds:.sodium chloride, acetaminophen **OR** acetaminophen, hydrALAZINE, HYDROcodone-homatropine, levalbuterol, ondansetron **OR** ondansetron (ZOFRAN) IV, sodium chloride flush, traMADol    Anti-infectives (From admission, onward)   Start     Dose/Rate Route Frequency Ordered Stop   10/18/17 1800  levofloxacin (LEVAQUIN) tablet 750 mg  Status:  Discontinued     750 mg Oral Daily 10/18/17 1033 10/18/17 1039   10/18/17 1800  levofloxacin (LEVAQUIN) tablet 500 mg     500 mg Oral Daily 10/18/17 1039 10/22/17 1759   10/17/17 1800  levofloxacin (LEVAQUIN) IVPB 750 mg  Status:  Discontinued     750 mg 100 mL/hr over 90 Minutes  Intravenous Every 24 hours 10/17/17 1620 10/18/17 1033        Objective:   Vitals:   10/20/17 1700 10/21/17 0416 10/21/17 0548 10/21/17 0744  BP: 124/66 138/86    Pulse: 86 (!) 102    Resp: 18 18 18    Temp: 98.2 F (36.8 C) 98.1 F (36.7 C) 97.9 F (36.6 C)   TempSrc: Oral Oral Oral   SpO2: 98% 98% 98% 98%  Weight:  71.9 kg (158 lb 9.6 oz)    Height:        Wt Readings from Last 3 Encounters:  10/21/17 71.9 kg (158 lb 9.6 oz)  10/17/17 74 kg (163 lb 3.2 oz)  09/07/17 77.6 kg (171 lb)     Intake/Output Summary (Last 24 hours) at 10/21/2017 1300 Last data filed at 10/21/2017 1206 Gross per 24 hour  Intake 1768 ml  Output 3450 ml  Net -1682 ml     Physical Exam  Gen:- Awake Alert, able to speak in sentences  HEENT:- [place].AT, No sclera icterus Nose- [place] 2 L/min Neck-Supple Neck,No JVD,.  Lungs- bilat exp coarse wheezing of moderate severity, no use of accessory muscles at rest, gets a little dyspneic when talking after a sig period of time CV- S1, S2 normal Abd-  +ve B.Sounds, Abd Soft, No tenderness,    Extremity/Skin:- No  edema,   +ve pulses Psych-affect is appropriate, oriented x3 Neuro-no new focal deficits, no tremors   Data Review:   CBC Recent Labs  Lab 10/17/17 1811 10/18/17 0719 10/19/17 0022 10/20/17 0048 10/21/17 0528  WBC 7.6 8.4 17.2* 14.9* 13.2*  HGB 15.0 15.6 15.2 15.3 14.6  HCT 44.9 47.2 46.3 46.2 43.5  PLT 267 296 324 327 265  MCV 90.5 90.9 91.3 91.8 90.8  MCH 30.2 30.1 30.0 30.4 30.5  MCHC 33.4 33.1 32.8 33.1 33.6  RDW 13.2 13.2 13.3 13.2 12.9  LYMPHSABS 1.2 1.5 1.5 1.2 1.5  MONOABS 0.2 0.2 0.5 0.5 0.7  EOSABS 0.2 0.2 0.0 0.0 0.0  BASOSABS 0.0 0.0 0.0 0.0 0.0    Chemistries  Recent Labs  Lab 10/17/17 1156 10/17/17 1751 10/18/17 0719 10/19/17 0022 10/20/17 0048 10/21/17 0528  NA 140  --  136 137  --  134*  K 3.7  --  4.9 5.1  --  5.1  CL 103  --  100* 100*  --  98*  CO2 27  --  26 28  --  30  GLUCOSE 95  --  146* 131*  --   121*  BUN <5*  --  6 11  --  17  CREATININE 0.89 0.98 0.93 1.11  --  0.75  CALCIUM 9.6  --  9.6 9.3  --  9.3  MG  --   --   --  2.2  2.4  --   AST  --   --   --  26  --   --   ALT  --   --   --  29  --   --   ALKPHOS  --   --   --  66  --   --   BILITOT  --   --   --  0.7  --   --    ------------------------------------------------------------------------------------------------------------------ No results for input(s): CHOL, HDL, LDLCALC, TRIG, CHOLHDL, LDLDIRECT in the last 72 hours.  No results found for: HGBA1C ------------------------------------------------------------------------------------------------------------------ No results for input(s): TSH, T4TOTAL, T3FREE, THYROIDAB in the last 72 hours.  Invalid input(s): FREET3 ------------------------------------------------------------------------------------------------------------------ No results for input(s): VITAMINB12, FOLATE, FERRITIN, TIBC, IRON, RETICCTPCT in the last 72 hours.  Coagulation profile No results for input(s): INR, PROTIME in the last 168 hours.  No results for input(s): DDIMER in the last 72 hours.  Cardiac Enzymes Recent Labs  Lab 10/20/17 0048  CKMB 7.8*   ------------------------------------------------------------------------------------------------------------------ No results found for: BNP  Between 7am to 7pm - Pager - [phone]  After 7pm go to [email or web address] - password [facility]  [facility] -  Office  [phone]

## 2017-10-21 NOTE — Progress Notes (Addendum)
Name: Shannon Knapp MRN: [id number] DOB: 01/19/1986    ADMISSION DATE:  10/17/2017 CONSULTATION DATE:  10/17/2017  REFERRING MD :  Dr. Denton Lank   CHIEF COMPLAINT:  Dyspnea  brief 32 year old male with PMH of  Asthma, Past smoker (reportley quiet March 2019). Recent Visit to ED on 4/12 with purulent rhinorrhea, given doxycycline and 4/22 for cough/shortness of breath given steroid taper and nebs   5/31 Presented to Pulmonary Office (Dr. Isaiah Serge) with reported 3 days of dyspnea, white mucus production, and wheezing with noted hemoptysis. feno 95 . Given Severity of dyspnea and wheezing sent to ED for further work-up. Given 125 mg solu-medrol, magnesium 2 mg, albuterol 10 mg and 20 mg, in addition to Atrovent with minimal improvement. PCCM asked to consult.    SIGNIFICANT EVENTS  5/31 > Presents to ED from office  CXR 5/31 > The chest is hyperexpanded but the lungs are clear. No pneumothorax or pleural effusion. Heart size is normal. No acute bony abnormality. CXR 6/4> Heart and mediastinal contours are within normal limits. No focal opacities or effusions.   10/18/17 - HIV and Urine TOx negative. Patient reports only marginally better. RN says he is unable to talk even on phone and very wheezy. Says albuterol not helping and wanting atrovent. Nebs just given without improvement.     SUBJECTIVE/OVERNIGHT/INTERVAL HX Continues to cough at intervals, especially after deep breath. States he is able to go without oxygen. Sats on RA 99-100%  VITAL SIGNS: Temp:  [97.7 F (36.5 C)-98.8 F (37.1 C)] 97.9 F (36.6 C) (06/04 0548) Pulse Rate:  [85-102] 102 (06/04 0416) Resp:  [18] 18 (06/04 0548) BP: (124-138)/(66-86) 138/86 (06/04 0416) SpO2:  [98 %-100 %] 98 % (06/04 0744) Weight:  [158 lb 9.6 oz (71.9 kg)] 158 lb 9.6 oz (71.9 kg) (06/04 0416)  PHYSICAL EXAMINATION:  General:  Young male supine in bed , In NAD on RA Neuro:  Awake , alert and appropriate HEENT:  NCAT, No LAD, MM  Pink and moist Cardiovascular: S1, S2, RRR, No RMG Lungs:  Difffuse inspiratory and Expiratory wheezes Abdomen:  Soft, NT, ND and +BS, non-obese Musculoskeletal:  No obvious deformities, No edema, no joint tenderness Skin:  Intact, warm and dry, tattoos  PULMONARY Recent Labs  Lab 10/18/17 0320  PHART 7.386  PCO2ART 42.3  PO2ART 78.5*  HCO3 24.8  O2SAT 95.0    CBC Recent Labs  Lab 10/19/17 0022 10/20/17 0048 10/21/17 0528  HGB 15.2 15.3 14.6  HCT 46.3 46.2 43.5  WBC 17.2* 14.9* 13.2*  PLT 324 327 265    COAGULATION No results for input(s): INR in the last 168 hours.  CARDIAC  No results for input(s): TROPONINI in the last 168 hours. No results for input(s): PROBNP in the last 168 hours.   CHEMISTRY Recent Labs  Lab 10/17/17 1156 10/17/17 1751 10/18/17 0719 10/19/17 0022 10/20/17 0048 10/21/17 0528  NA 140  --  136 137  --  134*  K 3.7  --  4.9 5.1  --  5.1  CL 103  --  100* 100*  --  98*  CO2 27  --  26 28  --  30  GLUCOSE 95  --  146* 131*  --  121*  BUN <5*  --  6 11  --  17  CREATININE 0.89 0.98 0.93 1.11  --  0.75  CALCIUM 9.6  --  9.6 9.3  --  9.3  MG  --   --   --  2.2 2.4  --   PHOS  --   --   --  3.8 3.0  --    Estimated Creatinine Clearance: 133.6 mL/min (by C-G formula based on SCr of 0.75 mg/dL).   LIVER Recent Labs  Lab 10/19/17 0022  AST 26  ALT 29  ALKPHOS 66  BILITOT 0.7  PROT 6.9  ALBUMIN 3.8     INFECTIOUS Recent Labs  Lab 10/18/17 2111 10/19/17 0022 10/20/17 0048  LATICACIDVEN 3.2* 3.7* 4.2*     ENDOCRINE CBG (last 3)  Recent Labs    10/20/17 1131 10/20/17 1624 10/20/17 2059  GLUCAP 95 122* 111*         IMAGING x48h  - image(s) personally visualized  -   highlighted in bold I reviewed CXR myself, hyperinflation noted  ASSESSMENT / PLAN:  32 year old male with PMH of asthma presenting with an asthma exacerbation.  Was sent here from pulmonary office.  Discussed with PCCM-NP.   Principal  Problem:   Severe persistent acute asthmatic bronchitis Active Problems:   Elevated blood pressure reading without diagnosis of hypertension   Respiratory distress   Status asthmaticus   Protein-calorie malnutrition, severe   Acute respiratory failure with hypoxemia (HCC)   Acute respiratory distress   Asthma exacerbation  Asthma: A: Continued wheezing throughout on exam Continued to cough, but better with Tessalon Perles Thick white secretions, no further hemoptysis last 24 hours Mag 2.4 on 6/3 Sats 100% on RA  P: Continue Soulmedrol>> do not decrease as Insp. And Exp wheezing noted 6/4 Wean oxygen as able to RA Sat goal is > 92-95% Continue ABX as ordered Continue Mucinex  for Mucillary clearance Continue Dulera 2 puffs twice daily ( LABA / ICS) Will add  Codeine for cough suppression Continue Tessalon  Check Mag 6/5  Allergic Rhinitis P: Continue antihistamine as ordered  Tachycardia Remains tachy per tele and sat monitor  P: Continue Tele Xopenex for rescue>> avoid albuterol Will add rescue inhaler as nebs are triggering cough    Bevelyn NgoSarah F. Groce, AGACNP-BC [facility]. Pager: [phone]- [phone].  10/21/2017 8:48 AM  Attending Note:  32 year old male with PMH of asthma presenting with asthma exacerbation, SOB and hemoptysis.  On exam, continues to wheeze.  Hemoptysis has resolved.  I reviewed CXR myself, no infiltrate noted.  Discussed with PCCM-NP.  Asthma exacerbation:  - Maintain on solumedrol 40 q6  - D/C nebs as makes patient cough more  - Mucinex  - Dulera  - Xopenex  - Abx as ordered, continue for now  Cough:  - D/C nebs  - Codeine for cough suppression.  - Tessalon pears  Hypoxemia:  - Titrate O2 for sat of 92-95%  Hemoptysis:  - Cough suppressants as ordered  - Monitor clinically  Allergic rhinitis:  - Anti-histamines as ordered  Sinus tach: improves when not coughing  - Tele monitoring  - Xopenex from  albuterol for tach  PCCM will continue to follow  Patient seen and examined, agree with above note.  I dictated the care and orders written for this patient under my direction.  Alyson ReedyWesam G. Codie Krogh, M.D. [facility]. Pager: [phone]. After hours pager: [phone].

## 2017-10-21 NOTE — Progress Notes (Signed)
CM following for progression of care. Unable to do consult due to patient wheezing at this time; CM to follow up. Abelino DerrickB Sofia Vanmeter [facility] Dba [facility] Surgery CenterRN,MHA,BSN [phone]

## 2017-10-22 ENCOUNTER — Encounter (HOSPITAL_COMMUNITY): Payer: Self-pay | Admitting: Radiology

## 2017-10-22 ENCOUNTER — Inpatient Hospital Stay (HOSPITAL_COMMUNITY): Payer: Self-pay

## 2017-10-22 DIAGNOSIS — R0603 Acute respiratory distress: Secondary | ICD-10-CM

## 2017-10-22 DIAGNOSIS — J309 Allergic rhinitis, unspecified: Secondary | ICD-10-CM

## 2017-10-22 LAB — BASIC METABOLIC PANEL
Anion gap: 5 (ref 5–15)
BUN: 17 mg/dL (ref 6–20)
CHLORIDE: 99 mmol/L — AB (ref 101–111)
CO2: 31 mmol/L (ref 22–32)
Calcium: 9.2 mg/dL (ref 8.9–10.3)
Creatinine, Ser: 0.84 mg/dL (ref 0.61–1.24)
GFR calc Af Amer: >60 mL/min (ref >60)
GFR calc non Af Amer: >60 mL/min (ref >60)
Glucose, Bld: 130 mg/dL — ABNORMAL HIGH (ref 65–99)
POTASSIUM: 4.4 mmol/L (ref 3.5–5.1)
SODIUM: 135 mmol/L (ref 135–145)

## 2017-10-22 LAB — CBC
HCT: 47 % (ref 39.0–52.0)
HEMOGLOBIN: 15.8 g/dL (ref 13.0–17.0)
MCH: 30.4 pg (ref 26.0–34.0)
MCHC: 33.6 g/dL (ref 30.0–36.0)
MCV: 90.4 fL (ref 78.0–100.0)
Platelets: 298 10*3/uL (ref 150–400)
RBC: 5.2 MIL/uL (ref 4.22–5.81)
RDW: 12.4 % (ref 11.5–15.5)
WBC: 12.9 10*3/uL — ABNORMAL HIGH (ref 4.0–10.5)

## 2017-10-22 LAB — LACTIC ACID, PLASMA: LACTIC ACID, VENOUS: 2.7 mmol/L — AB (ref 0.5–1.9)

## 2017-10-22 LAB — GLUCOSE, CAPILLARY: GLUCOSE-CAPILLARY: 103 mg/dL — AB (ref 65–99)

## 2017-10-22 LAB — MAGNESIUM: MAGNESIUM: 2.3 mg/dL (ref 1.7–2.4)

## 2017-10-22 LAB — ECHOCARDIOGRAM COMPLETE
Height: 71 in
WEIGHTICAEL: 2523.2 [oz_av]

## 2017-10-22 MED ORDER — IOHEXOL 300 MG/ML  SOLN
100.0000 mL | Freq: Once | INTRAMUSCULAR | Status: AC | PRN
  Administered 2017-10-22: 100 mL via INTRAVENOUS

## 2017-10-22 MED ORDER — GUAIFENESIN ER 600 MG PO TB12
1200.0000 mg | ORAL_TABLET | Freq: Two times a day (BID) | ORAL | Status: DC
  Administered 2017-10-22 (×2): 1200 mg via ORAL
  Filled 2017-10-22 (×3): qty 2

## 2017-10-22 MED ORDER — BENZONATATE 100 MG PO CAPS
200.0000 mg | ORAL_CAPSULE | Freq: Three times a day (TID) | ORAL | Status: DC
  Administered 2017-10-22 – 2017-10-30 (×24): 200 mg via ORAL
  Filled 2017-10-22 (×27): qty 2

## 2017-10-22 NOTE — Progress Notes (Signed)
  Echocardiogram 2D Echocardiogram has been performed.  Shannon Knapp  Shannon Knapp 10/22/2017, 8:52 AM
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
## 2017-10-22 NOTE — Progress Notes (Signed)
[facility]                                                                              Patient Demographics  Shannon Knapp, is a 32 y.o. male, DOB - 02/22/1986, WUJ:[id number]  Admit date - 10/17/2017   Admitting Physician Lahoma Crocker, MD  Outpatient Primary MD for the patient is Patient, No Pcp Per  Outpatient specialists:   LOS - 5  days   Medical records reviewed and are as summarized below:    Chief Complaint  Patient presents with  . COPD       Brief summary   Patient is a 32 year old male with history of asthma, past smoker, quit March 2019, presented to pulmonary office for follow-up with Dr. Isaiah Serge, had reported 3 days of dyspnea, wheezing with coughing, mucus phlegm and hemoptysis.  Patient was sent to ED for further work-up.  CCM has been following.  Assessment & Plan    Principal Problem:   Severe persistent acute asthmatic bronchitis - patient also reports family history of asthma with father, uncle and now his son, did have asthma as a child -Work-up so far negative, L5 antitrypsin negative, HIV negative, eosinophils within normal limits, chest x-ray shows no infiltrates, sputum culture showed normal flora.  Respiratory virus panel negative -StillWheezing, continue IV steroids, O2, Mucinex, Dulera, Tessalon Perles, Hycodan -2D echo done today showed EF 65 to 70% with normal diastolic function, no regional wall motion abnormality. -Patient asking about CT chest however will await pulmonology recommendation, could consider HRCT  Active Problems:    Hemoptysis -Likely due to acute tracheobronchitis from #1, continue Hycodan, Tessalon Perles DC Lovenox, placed on SCD Continue antihistamine  Code Status: Full CODE STATUS DVT Prophylaxis: SCD's Family Communication: Discussed in detail with the patient, all imaging results, lab results explained to the patient and fiance   Disposition Plan: When medically ready  Time Spent in  minutes 35 minutes  Procedures:  2D echo EF 65 to 70%, normal motion abnormalities  Consultants:   Pulmonary  Antimicrobials:      Medications  Scheduled Meds: . benzonatate  200 mg Oral TID  . dextromethorphan-guaiFENesin  1 tablet Oral BID  . diphenhydrAMINE  25 mg Oral QHS  . guaiFENesin  1,200 mg Oral BID  . methylPREDNISolone (SOLU-MEDROL) injection  40 mg Intravenous Q6H  . mometasone-formoterol  2 puff Inhalation BID  . pantoprazole  40 mg Oral Daily  . protein supplement shake  11 oz Oral TID BM  . sodium chloride flush  3 mL Intravenous Q12H  . sodium chloride flush  3 mL Intravenous Q12H  . traMADol  100 mg Oral Q6H   Continuous Infusions: . sodium chloride     PRN Meds:.sodium chloride, acetaminophen **OR** acetaminophen, hydrALAZINE, HYDROcodone-homatropine, levalbuterol, ondansetron **OR** ondansetron (ZOFRAN) IV, sodium chloride flush, traMADol   Antibiotics   Anti-infectives (From admission, onward)   Start     Dose/Rate Route Frequency Ordered Stop   10/18/17 1800  levofloxacin (LEVAQUIN) tablet 750 mg  Status:  Discontinued     750 mg Oral Daily 10/18/17 1033 10/18/17 1039   10/18/17 1800  levofloxacin (LEVAQUIN)  tablet 500 mg     500 mg Oral Daily 10/18/17 1039 10/21/17 1646   10/17/17 1800  levofloxacin (LEVAQUIN) IVPB 750 mg  Status:  Discontinued     750 mg 100 mL/hr over 90 Minutes Intravenous Every 24 hours 10/17/17 1620 10/18/17 1033        Subjective:   Shannon Knapp was seen and examined today.  Continues to have wheezing, coughing.  States overnight coughing spells causing epigastric pain. Patient denies dizziness, N/V/D/C, new weakness, numbess, tingling.   Objective:   Vitals:   10/21/17 2051 10/22/17 0554 10/22/17 0831 10/22/17 1158  BP:  136/79  (!) 156/83  Pulse:  88  92  Resp:  18  20  Temp:  98.1 F (36.7 C)  98.3 F (36.8 C)  TempSrc:  Oral  Oral  SpO2: 96% 98% 100% 98%  Weight:  71.5 kg (157 lb 11.2 oz)      Height:        Intake/Output Summary (Last 24 hours) at 10/22/2017 1229 Last data filed at 10/22/2017 [id number] Gross per 24 hour  Intake 2705 ml  Output 2626 ml  Net 79 ml     Wt Readings from Last 3 Encounters:  10/22/17 71.5 kg (157 lb 11.2 oz)  10/17/17 74 kg (163 lb 3.2 oz)  09/07/17 77.6 kg (171 lb)     Exam  General: Alert and oriented x 3, NAD  Eyes:   HEENT:  Atraumatic, normocephalic, normal oropharynx  Cardiovascular: S1 S2 auscultated, Regular rate and rhythm.  Respiratory: Diffuse bilateral expiratory wheezing  Gastrointestinal: Soft, nontender, nondistended, + bowel sounds  Ext: no pedal edema bilaterally  Neuro: no new deficit  Musculoskeletal: No digital cyanosis, clubbing  Skin: No rashes  Psych: Normal affect and demeanor, alert and oriented x3    Data Reviewed:  I have personally reviewed following labs and imaging studies  Micro Results Recent Results (from the past 240 hour(s))  Respiratory Panel by PCR     Status: None   Collection Time: 10/18/17 11:12 AM  Result Value Ref Range Status   Adenovirus NOT DETECTED NOT DETECTED Final   Coronavirus 229E NOT DETECTED NOT DETECTED Final   Coronavirus HKU1 NOT DETECTED NOT DETECTED Final   Coronavirus NL63 NOT DETECTED NOT DETECTED Final   Coronavirus OC43 NOT DETECTED NOT DETECTED Final   Metapneumovirus NOT DETECTED NOT DETECTED Final   Rhinovirus / Enterovirus NOT DETECTED NOT DETECTED Final   Influenza A NOT DETECTED NOT DETECTED Final   Influenza B NOT DETECTED NOT DETECTED Final   Parainfluenza Virus 1 NOT DETECTED NOT DETECTED Final   Parainfluenza Virus 2 NOT DETECTED NOT DETECTED Final   Parainfluenza Virus 3 NOT DETECTED NOT DETECTED Final   Parainfluenza Virus 4 NOT DETECTED NOT DETECTED Final   Respiratory Syncytial Virus NOT DETECTED NOT DETECTED Final   Bordetella pertussis NOT DETECTED NOT DETECTED Final   Chlamydophila pneumoniae NOT DETECTED NOT DETECTED Final   Mycoplasma  pneumoniae NOT DETECTED NOT DETECTED Final    Comment: Performed at [facility], [address] [place], [place] [address]  Culture, sputum-assessment     Status: None   Collection Time: 10/18/17 11:23 AM  Result Value Ref Range Status   Specimen Description SPUTUM  Final   Special Requests NONE  Final   Sputum evaluation   Final    THIS SPECIMEN IS ACCEPTABLE FOR SPUTUM CULTURE Performed at [facility], [address] [place], [place] [address]  Report Status 10/18/2017 FINAL  Final  Culture, respiratory (NON-Expectorated)     Status: None   Collection Time: 10/18/17 11:23 AM  Result Value Ref Range Status   Specimen Description SPUTUM  Final   Special Requests NONE Reflexed from [id number]  Final   Gram Stain   Final    RARE WBC PRESENT, PREDOMINANTLY PMN FEW YEAST FEW GRAM POSITIVE COCCI IN CHAINS FEW GRAM POSITIVE RODS RARE GRAM NEGATIVE RODS    Culture   Final    Consistent with normal respiratory flora. Performed at [facility], [address], [place], [place] [address]    Report Status 10/20/2017 FINAL  Final    Radiology Reports Dg Chest 2 View  Result Date: 10/17/2017 CLINICAL DATA:  Cough EXAM: CHEST - 2 VIEW COMPARISON:  None. FINDINGS: Lungs are hyperexpanded. There is no edema or consolidation. Heart size and pulmonary vascularity are normal. No adenopathy. No evident bone lesions. IMPRESSION: Lungs hyperexpanded without edema or consolidation. Heart size normal. Electronically Signed   By: Bretta Bang III M.D.   On: 10/17/2017 12:29   Dg Chest Port 1 View  Result Date: 10/21/2017 CLINICAL DATA:  Shortness of breath, cough EXAM: PORTABLE CHEST 1 VIEW COMPARISON:  10/18/2017 FINDINGS: Heart and mediastinal contours are within normal limits. No focal opacities or effusions. No acute bony abnormality. IMPRESSION: No active disease. Electronically Signed   By: Charlett Nose M.D.   On: 10/21/2017 07:52   Dg Chest Port 1 View  Result Date:  10/18/2017 CLINICAL DATA:  32 year old male with asthma exacerbation EXAM: PORTABLE CHEST 1 VIEW COMPARISON:  Prior chest x-ray 10/17/2017 FINDINGS: The lungs are clear and negative for focal airspace consolidation, pulmonary edema or suspicious pulmonary nodule. The degree of pulmonary hyperinflation is similar compared to prior. No pleural effusion or pneumothorax. Cardiac and mediastinal contours are within normal limits. No acute fracture or lytic or blastic osseous lesions. The visualized upper abdominal bowel gas pattern is unremarkable. IMPRESSION: 1. No acute cardiopulmonary process. 2. Similar degree of pulmonary hyperinflation. Electronically Signed   By: Malachy Moan M.D.   On: 10/18/2017 12:34   Dg Chest Portable 1 View  Result Date: 10/17/2017 CLINICAL DATA:  Shortness of breath today. EXAM: PORTABLE CHEST 1 VIEW COMPARISON:  PA and lateral chest 10/17/2017. FINDINGS: The chest is hyperexpanded but the lungs are clear. No pneumothorax or pleural effusion. Heart size is normal. No acute bony abnormality. IMPRESSION: Pulmonary hyperexpansion.  Lungs are clear. Electronically Signed   By: Drusilla Kanner M.D.   On: 10/17/2017 15:38    Lab Data:  CBC: Recent Labs  Lab 10/17/17 1811 10/18/17 0719 10/19/17 0022 10/20/17 0048 10/21/17 0528 10/22/17 0453  WBC 7.6 8.4 17.2* 14.9* 13.2* 12.9*  NEUTROABS 5.9 6.5 15.1* 13.3* 10.9*  --   HGB 15.0 15.6 15.2 15.3 14.6 15.8  HCT 44.9 47.2 46.3 46.2 43.5 47.0  MCV 90.5 90.9 91.3 91.8 90.8 90.4  PLT 267 296 324 327 265 298   Basic Metabolic Panel: Recent Labs  Lab 10/17/17 1156 10/17/17 1751 10/18/17 0719 10/19/17 0022 10/20/17 0048 10/21/17 0528 10/22/17 0453  NA 140  --  136 137  --  134* 135  K 3.7  --  4.9 5.1  --  5.1 4.4  CL 103  --  100* 100*  --  98* 99*  CO2 27  --  26 28  --  30 31  GLUCOSE 95  --  146* 131*  --  121* 130*  BUN <5*  --  6 11  --  17 17  CREATININE 0.89 0.98 0.93 1.11  --  0.75 0.84  CALCIUM 9.6  --   9.6 9.3  --  9.3 9.2  MG  --   --   --  2.2 2.4  --  2.3  PHOS  --   --   --  3.8 3.0  --   --    GFR: Estimated Creatinine Clearance: 126.5 mL/min (by C-G formula based on SCr of 0.84 mg/dL). Liver Function Tests: Recent Labs  Lab 10/19/17 0022  AST 26  ALT 29  ALKPHOS 66  BILITOT 0.7  PROT 6.9  ALBUMIN 3.8   No results for input(s): LIPASE, AMYLASE in the last 168 hours. No results for input(s): AMMONIA in the last 168 hours. Coagulation Profile: No results for input(s): INR, PROTIME in the last 168 hours. Cardiac Enzymes: Recent Labs  Lab 10/20/17 0048  CKTOTAL 285  CKMB 7.8*   BNP (last 3 results) No results for input(s): PROBNP in the last 8760 hours. HbA1C: No results for input(s): HGBA1C in the last 72 hours. CBG: Recent Labs  Lab 10/20/17 0832 10/20/17 1131 10/20/17 1624 10/20/17 2059 10/22/17 0749  GLUCAP 113* 95 122* 111* 103*   Lipid Profile: No results for input(s): CHOL, HDL, LDLCALC, TRIG, CHOLHDL, LDLDIRECT in the last 72 hours. Thyroid Function Tests: No results for input(s): TSH, T4TOTAL, FREET4, T3FREE, THYROIDAB in the last 72 hours. Anemia Panel: No results for input(s): VITAMINB12, FOLATE, FERRITIN, TIBC, IRON, RETICCTPCT in the last 72 hours. Urine analysis:    Component Value Date/Time   COLORURINE STRAW (A) 10/18/2017 0751   APPEARANCEUR CLEAR 10/18/2017 0751   LABSPEC 1.006 10/18/2017 0751   PHURINE 6.0 10/18/2017 0751   GLUCOSEU 50 (A) 10/18/2017 0751   HGBUR NEGATIVE 10/18/2017 0751   BILIRUBINUR NEGATIVE 10/18/2017 0751   KETONESUR NEGATIVE 10/18/2017 0751   PROTEINUR NEGATIVE 10/18/2017 0751   NITRITE NEGATIVE 10/18/2017 0751   LEUKOCYTESUR NEGATIVE 10/18/2017 0751     Ripudeep Rai M.D. [facility] 10/22/2017, 12:29 PM  Pager: [phone] Between 7am to 7pm - call Pager - [phone]  After 7pm go to [email or web address] - password [email or web address]  Call night coverage person covering after 7pm

## 2017-10-22 NOTE — Progress Notes (Signed)
Critical Lab Value:  Lab reports lactic acid of 2.7.  Physician made aware.

## 2017-10-22 NOTE — Progress Notes (Signed)
Pt called expressing concern for missing nutritional shake. Called pharmacy, requested premier flavor vanilla  Pt aware

## 2017-10-22 NOTE — Progress Notes (Addendum)
Name: Shannon Knapp MRN: [id number] DOB: Jun 12, 1985    ADMISSION DATE:  10/17/2017 CONSULTATION DATE:  10/17/2017  REFERRING MD :  Dr. Denton Lank   CHIEF COMPLAINT:  Dyspnea  brief 32 year old male with PMH of  Asthma, Past smoker (reportley quiet March 2019). Recent Visit to ED on 4/12 with purulent rhinorrhea, given doxycycline and 4/22 for cough/shortness of breath given steroid taper and nebs   5/31 Presented to Pulmonary Office (Dr. Isaiah Serge) with reported 3 days of dyspnea, white mucus production, and wheezing with noted hemoptysis. feno 95 . Given Severity of dyspnea and wheezing sent to ED for further work-up. Given 125 mg solu-medrol, magnesium 2 mg, albuterol 10 mg and 20 mg, in addition to Atrovent with minimal improvement. PCCM asked to consult.    SIGNIFICANT EVENTS  5/31 > Presents to ED from office  CXR 5/31 > The chest is hyperexpanded but the lungs are clear. No pneumothorax or pleural effusion. Heart size is normal. No acute bony abnormality. CXR 6/4> Heart and mediastinal contours are within normal limits. No focal opacities or effusions.      SUBJECTIVE/OVERNIGHT/INTERVAL HX Still with sig cough, some light pink hemoptysis.  Wheezing, remains SOB with minimal activity.   Frustrated that he is not any better and feels like he/we dont know exactly what problem is.  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    VITAL SIGNS: Temp:  [98 F (36.7 C)-98.3 F (36.8 C)] 98.3 F (36.8 C) (06/05 1158) Pulse Rate:  [82-92] 92 (06/05 1158) Resp:  [16-20] 20 (06/05 1158) BP: (136-156)/(68-83) 156/83 (06/05 1158) SpO2:  [96 %-100 %] 98 % (06/05 1158) Weight:  [71.5 kg (157 lb 11.2 oz)] 71.5 kg (157 lb 11.2 oz) (06/05 0554)  PHYSICAL EXAMINATION: General:  Pleasant young male, NAD  HEENT: MM pink/moist Neuro: awake, alert, appropriate, MAE  CV: s1s2 rrr, no m/r/g PULM: even/non-labored, exp wheeze throughout, some scattered rhonchi  AO:ZHYQ, non-tender, bsx4 active    Extremities: warm/dry, no edema  Skin: no rashes or lesions   PULMONARY Recent Labs  Lab 10/18/17 0320  PHART 7.386  PCO2ART 42.3  PO2ART 78.5*  HCO3 24.8  O2SAT 95.0    CBC Recent Labs  Lab 10/20/17 0048 10/21/17 0528 10/22/17 0453  HGB 15.3 14.6 15.8  HCT 46.2 43.5 47.0  WBC 14.9* 13.2* 12.9*  PLT 327 265 298    COAGULATION No results for input(s): INR in the last 168 hours.  CARDIAC  No results for input(s): TROPONINI in the last 168 hours. No results for input(s): PROBNP in the last 168 hours.   CHEMISTRY Recent Labs  Lab 10/17/17 1156 10/17/17 1751 10/18/17 0719 10/19/17 0022 10/20/17 0048 10/21/17 0528 10/22/17 0453  NA 140  --  136 137  --  134* 135  K 3.7  --  4.9 5.1  --  5.1 4.4  CL 103  --  100* 100*  --  98* 99*  CO2 27  --  26 28  --  30 31  GLUCOSE 95  --  146* 131*  --  121* 130*  BUN <5*  --  6 11  --  17 17  CREATININE 0.89 0.98 0.93 1.11  --  0.75 0.84  CALCIUM 9.6  --  9.6 9.3  --  9.3 9.2  MG  --   --   --  2.2 2.4  --  2.3  PHOS  --   --   --  3.8 3.0  --   --  Estimated Creatinine Clearance: 126.5 mL/min (by C-G formula based on SCr of 0.84 mg/dL).   LIVER Recent Labs  Lab 10/19/17 0022  AST 26  ALT 29  ALKPHOS 66  BILITOT 0.7  PROT 6.9  ALBUMIN 3.8     INFECTIOUS Recent Labs  Lab 10/19/17 0022 10/20/17 0048 10/22/17 0453  LATICACIDVEN 3.7* 4.2* 2.7*     ENDOCRINE CBG (last 3)  Recent Labs    10/20/17 1624 10/20/17 2059 10/22/17 0749  GLUCAP 122* 111* 103*     ASSESSMENT / PLAN: 32 year old male with PMH of asthma presenting with asthma exacerbation, SOB and hemoptysis.   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   Quit smoking 5 months ago, has smoked on and off for 15 years.  Wife smokes but not in house.  Feels like he has had no difficulties with his breathing until moving here from [place] a few years ago.  Has been "dealing with this" for 2 years now without any dx other than asthma.  He did have asthma  as a child.  Is not significantly better as of yet for this admission.  Hemoptysis is intermittent, light in color.  Has had ~20 lbs weight loss in last 6 months   Thus far... . Alpha 1 neg  HIV neg  eos wnl  Echo normal  CXR ess normal  RVP neg  Sputum culture - normal flora   Presumed asthma with exacerbation  Cough  Hemoptysis - improved  P: Continue IV steroids for now  Supplemental O2 as needed.  S/p course abx  Continue mucinex  Continue dulera  Cough suppression -- tessalon, codeine  Pulmonary hygiene  Consider CT chest if no improvement although doubt would be revealing given consistently clear CXR  Allergic Rhinitis P: Continue antihistamine as ordered   Dirk DressKaty Whiteheart, NP 10/22/2017  12:19 PM Pager: [phone] or [phone]  Attending Note:  32 year old male with PMH of asthma who has had significant difficulty with asthma control since moving to [place].  Presenting to PCCM with SOB, status asthmaticus and hemoptysis.  On exam, continues to wheeze.  I reviewed CXR myself, no acute disease noted.  Discussed with PCCM-NP.  Status asthmaticus:  - Maintain on solumedrol 40 mg IV q6 hours  - D/C nebs as makes patient cough more  - Mucinex  - Dulera  - Xopenex  - Abx as ordered, continue for now  - Pulmonary hygienes  Cough:  - D/C nebs to prevent coughing  - Continue codeine for cough suppression  - Tessalon pearls  Hypoxemia:  - Titrate O2 for sat of 88-92%  Hemoptysis: resolved  - Cough suppressants as ordered  - Monitor clinically  Allergies:  - Anti-histamine as ordered  - Will need allergy tested  - Check IgE level and eosinophil normal  Sinus tach: improving when not coughing  - Tele monitoring  - Xopenex from albuterol for trach  PCCM will continue to follow.  Patient seen and examined, agree with above note.  I dictated the care and orders written for this patient under my direction.  Alyson ReedyWesam G. Yacoub, M.D. [facility]  Pulmonary/Critical Care Medicine. Pager: [phone]. After hours pager: [phone].

## 2017-10-23 DIAGNOSIS — R55 Syncope and collapse: Secondary | ICD-10-CM

## 2017-10-23 DIAGNOSIS — R0902 Hypoxemia: Secondary | ICD-10-CM

## 2017-10-23 LAB — BASIC METABOLIC PANEL
Anion gap: 7 (ref 5–15)
BUN: 21 mg/dL — ABNORMAL HIGH (ref 6–20)
CO2: 31 mmol/L (ref 22–32)
Calcium: 9.7 mg/dL (ref 8.9–10.3)
Chloride: 96 mmol/L — ABNORMAL LOW (ref 101–111)
Creatinine, Ser: 0.85 mg/dL (ref 0.61–1.24)
GFR calc Af Amer: >60 mL/min (ref >60)
GFR calc non Af Amer: >60 mL/min (ref >60)
GLUCOSE: 122 mg/dL — AB (ref 65–99)
POTASSIUM: 4.6 mmol/L (ref 3.5–5.1)
Sodium: 134 mmol/L — ABNORMAL LOW (ref 135–145)

## 2017-10-23 LAB — GLUCOSE, CAPILLARY: Glucose-Capillary: 131 mg/dL — ABNORMAL HIGH (ref 65–99)

## 2017-10-23 MED ORDER — GUAIFENESIN ER 600 MG PO TB12
600.0000 mg | ORAL_TABLET | Freq: Two times a day (BID) | ORAL | Status: DC
  Administered 2017-10-23 – 2017-10-30 (×15): 600 mg via ORAL
  Filled 2017-10-23 (×14): qty 1

## 2017-10-23 MED ORDER — ADULT MULTIVITAMIN W/MINERALS CH
1.0000 | ORAL_TABLET | Freq: Every day | ORAL | Status: DC
  Administered 2017-10-23 – 2017-10-30 (×8): 1 via ORAL
  Filled 2017-10-23 (×8): qty 1

## 2017-10-23 NOTE — Progress Notes (Signed)
Nutrition Follow-up  DOCUMENTATION CODES:   Severe malnutrition in context of acute illness/injury  INTERVENTION:   -D/c Magic Cup -MVI with minerals daily -Continue Premier Protein TID, each supplement provides 160 kcals and 30 grams protein  NUTRITION DIAGNOSIS:   Severe Malnutrition(Acute) related to poor appetite, acute illness(Severe persistent Acute asthmatic bronchits) as evidenced by energy intake < or equal to 75% for > or equal to 1 month, percent weight loss.  Ongoing  GOAL:   Patient will meet greater than or equal to 90% of their needs  Progressing  MONITOR:   PO intake, Supplement acceptance, Labs, Weight trends, I & O's  REASON FOR ASSESSMENT:   Malnutrition Screening Tool    ASSESSMENT:   32 y.o. 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  Intake has improved since admission; noted 100% meal completion. Pt is also compliant with Premier Protein supplements, which he enjoys.  Medications reviewed and include solu-medrol.   Labs reviewed: CBGS: 103-131.   Diet Order:   Diet Order           Diet regular Room service appropriate? Yes; Fluid consistency: Thin  Diet effective now          EDUCATION NEEDS:   No education needs have been identified at this time  Skin:  Skin Assessment: Reviewed RN Assessment  Last BM:  10/21/17  Height:   Ht Readings from Last 1 Encounters:  10/19/17 5\' 11"  (1.803 m)    Weight:   Wt Readings from Last 1 Encounters:  10/23/17 155 lb 6.4 oz (70.5 kg)    Ideal Body Weight:  78.18 kg  BMI:  Body mass index is 21.67 kg/m.  Estimated  Nutritional Needs:   Kcal:  2150-2300 kcals (30-32 kcal/kg bw)  Protein:  95-109g Pro (1.3-1.5g.kg bw)  Fluid:  2.1-2.3 L (1 ml/kcal)    Yara Tomkinson A. Mayford KnifeWilliams, RD, LDN, CDE Pager: [phone] After hours Pager: [phone]

## 2017-10-23 NOTE — Progress Notes (Signed)
Name: Shannon Knapp MRN: [id number] DOB: 07/02/1985    ADMISSION DATE:  10/17/2017 CONSULTATION DATE:  10/17/2017  REFERRING MD :  Dr. Denton LankSteinl   CHIEF COMPLAINT:  Dyspnea  brief 32 year old male with PMH of  Asthma, Past smoker (reportley quiet March 2019). Recent Visit to ED on 4/12 with purulent rhinorrhea, given doxycycline and 4/22 for cough/shortness of breath given steroid taper and nebs   5/31 Presented to Pulmonary Office (Dr. Isaiah SergeMannam) with reported 3 days of dyspnea, white mucus production, and wheezing with noted hemoptysis. feno 95 . Given Severity of dyspnea and wheezing sent to ED for further work-up. Given 125 mg solu-medrol, magnesium 2 mg, albuterol 10 mg and 20 mg, in addition to Atrovent with minimal improvement. PCCM asked to consult.    SIGNIFICANT EVENTS  5/31 > Presents to ED from office  CXR 5/31 > The chest is hyperexpanded but the lungs are clear. No pneumothorax or pleural effusion. Heart size is normal. No acute bony abnormality. CXR 6/4> Heart and mediastinal contours are within normal limits. No focal opacities or effusions.   SUBJECTIVE/OVERNIGHT/INTERVAL HX Hemoptysis resolved, SOB overall better, NAD  VITAL SIGNS: Temp:  [98 F (36.7 C)-98.2 F (36.8 C)] 98 F (36.7 C) (06/06 1231) Pulse Rate:  [68-99] 99 (06/06 1231) Resp:  [18] 18 (06/06 1231) BP: (131-156)/(86-96) 133/86 (06/06 1231) SpO2:  [97 %-100 %] 100 % (06/06 1231) Weight:  [155 lb 6.4 oz (70.5 kg)] 155 lb 6.4 oz (70.5 kg) (06/06 0546)  PHYSICAL EXAMINATION: General:  Young male, resting in bed, NAD HEENT: [place]/AT, PERRL, EOM-I and MMM Neuro: Awake and interactive, moving all ext to command CV: RRR, Nl S1/S2 and -M/R/G PULM: End exp wheezing GI: Soft, NT, ND and +BS Extremities: Warm and interactive Skin: No rashes  PULMONARY Recent Labs  Lab 10/18/17 0320  PHART 7.386  PCO2ART 42.3  PO2ART 78.5*  HCO3 24.8  O2SAT 95.0   CBC Recent Labs  Lab 10/20/17 0048  10/21/17 0528 10/22/17 0453  HGB 15.3 14.6 15.8  HCT 46.2 43.5 47.0  WBC 14.9* 13.2* 12.9*  PLT 327 265 298   COAGULATION No results for input(s): INR in the last 168 hours.  CARDIAC  No results for input(s): TROPONINI in the last 168 hours. No results for input(s): PROBNP in the last 168 hours.  CHEMISTRY Recent Labs  Lab 10/18/17 0719 10/19/17 0022 10/20/17 0048 10/21/17 0528 10/22/17 0453 10/23/17 0352  NA 136 137  --  134* 135 134*  K 4.9 5.1  --  5.1 4.4 4.6  CL 100* 100*  --  98* 99* 96*  CO2 26 28  --  30 31 31   GLUCOSE 146* 131*  --  121* 130* 122*  BUN 6 11  --  17 17 21*  CREATININE 0.93 1.11  --  0.75 0.84 0.85  CALCIUM 9.6 9.3  --  9.3 9.2 9.7  MG  --  2.2 2.4  --  2.3  --   PHOS  --  3.8 3.0  --   --   --    Estimated Creatinine Clearance: 123.3 mL/min (by C-G formula based on SCr of 0.85 mg/dL).  LIVER Recent Labs  Lab 10/19/17 0022  AST 26  ALT 29  ALKPHOS 66  BILITOT 0.7  PROT 6.9  ALBUMIN 3.8   INFECTIOUS Recent Labs  Lab 10/19/17 0022 10/20/17 0048 10/22/17 0453  LATICACIDVEN 3.7* 4.2* 2.7*   ENDOCRINE CBG (last 3)  Recent Labs  10/20/17 2059 10/22/17 0749 10/23/17 0752  GLUCAP 111* 103* 131*   ASSESSMENT / PLAN: 32 year old male with PMH of asthma presenting with asthma exacerbation, SOB and hemoptysis.   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   Quit smoking 5 months ago, has smoked on and off for 15 years.  Wife smokes but not in house.  Feels like he has had no difficulties with his breathing until moving here from [place] a few years ago.  Has been "dealing with this" for 2 years now without any dx other than asthma.  He did have asthma as a child.  Is not significantly better as of yet for this admission.  Hemoptysis is intermittent, light in color.  Has had ~20 lbs weight loss in last 6 months   Thus far... . Alpha 1 neg  HIV neg  eos wnl  Echo normal  CXR ess normal  RVP neg  Sputum culture - normal flora   32 year old  male with PMH of asthma who has had significant difficulty with asthma control since moving to [place].  Presenting to PCCM with SOB, status asthmaticus and hemoptysis.  On exam, continues to wheeze.  I reviewed CXR myself, no acute disease noted.  Discussed with PCCM-NP.  Status asthmaticus:  - Maintain on solumedrol 40 mg IV q6  - D/C nebs, causes cough  - Mucinex  - Dulera  - Xopenex   - Abx as ordered, continue for now  Cough:  - D/C nebs to prevent coughing  - Continue codeine for cough suppression  - Tessalon pearls  Hypoxemia:  - Titrate O2 for sat of 88-92%  Hemoptysis: resolved  - Cough suppressant  - Monitor clinically  Allergies:  - Anti-histamine as ordered  - Will need allergy tested  - Check IgE level and eosinophil normal  Sinus tach: improving when not coughing  - Tele monitoring  - Xopenex from albuterol for trach  - Cards consult  Syncope  - Cardiology consult  PCCM will continue to follow.  Alyson Reedy, M.D. [facility]. Pager: [phone]. After hours pager: [phone].

## 2017-10-23 NOTE — Progress Notes (Signed)
Pt's heart rate elevated in 120's periodically. MD is aware. No new orders. Pt is asymptomatic.

## 2017-10-23 NOTE — Consult Note (Addendum)
Cardiology Consultation     Patient ID: Shannon Knapp, Shannon Knapp [id number], 1985/09/27 Admit date: 10/17/2017 Date of Consult: 10/23/2017  Primary Physician: No PCP Primary Cardiologist: None Referring Physician: Dr. Nelda Marseille  Chief Complaint: Shortness of breath, multiple syncopal episode. Reason for Consultation: Sinus tachycardia with mild exertion and multiple syncopal episode.  HPI: This is a 32 y.o. male with a past medical history significant for COPD/asthma, no pulmonary function on file was admitted on Oct 17, 2017 with worsening shortness of breath and wheezing, was treated with breathing treatments, steroid and antibiotics.  Patient was complaining of worsening exertional dyspnea, decreased appetite, unintentional weight loss of 30 pounds, daily night sweats for the past couple of month. For the past couple of weeks he has multiple syncopal episodes, couple of them after excessive coughing, they have occurred at different occasions while walking or playing video game.  Patient feels a little dizzy with blurry vision and palpitations before passing out, no witnessed seizure-like activity, no tongue bite or incontinence, episodes last between 1 to 5 minutes and not much significant postictal state.  According to wife his eyes rolls back during those episodes and he appears little confused and weak afterwards.  He was also complaining of left upper quadrant and left-sided chest pain associated with excessive coughing, no radiation, no nausea or vomiting or palpitation.  He denies any orthopnea or PND.  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  He denies any lower extremity swelling.  Patient developed sinus tachycardia with albuterol-albuterol was stopped few days ago and now he is using Xopenex.  He was developing sinus tachycardia with heart rate in 140s today with coughing and with mild  exertion like going to the restroom.  He does become hypoxic with exertion.  Currently saturating well on room air at rest. Cardiology was consulted for his sinus tachycardia and multiple syncopal episodes. He has normal EKG on admission, echo was performed yesterday and it was normal.  PMHx:  CARDIAC HISTORY: Not significant. Echo:  10/22/17-normal Cath: Never had a cardiac cath.  Past Medical History:  Diagnosis Date  . Asthma   . Chronic bronchitis ([place])   . COPD (chronic obstructive pulmonary disease) ([place])    History reviewed. No pertinent surgical history.  FAMHx: Family History  Problem Relation Age of Onset  . Hypertension Mother   . Asthma Father   . Hypertension Father     SOCHx:  reports that he has quit smoking. He has never used smokeless tobacco. He reports that he drinks about 9.0 oz of alcohol per week. He reports that he does not use drugs.  He works as a Freight forwarder at [employer].  ALLERGIES: Allergies  Allergen Reactions  . Amoxicillin Hives    Has patient had a PCN reaction causing immediate rash, facial/tongue/throat swelling, SOB or lightheadedness with hypotension: Yes Has patient had a PCN reaction causing severe rash involving mucus membranes or skin necrosis: No Has patient had a PCN reaction that required hospitalization: No Has patient had a PCN reaction occurring within the last 10 years: Yes If all of the above answers are "NO", then may proceed with Cephalosporin use.  . Fish Allergy Swelling  . Shellfish Allergy Swelling  . Penicillins Hives    Has patient had a PCN reaction causing immediate rash, facial/tongue/throat swelling, SOB or lightheadedness with hypotension: Yes Has patient had a PCN reaction causing severe  rash involving mucus membranes or skin necrosis: No Has patient had a PCN reaction that required hospitalization: No Has patient had a PCN reaction occurring within the last 10 years: Yes If all of the above answers are "NO",  then may proceed with Cephalosporin use.     HOME MEDICATIONS: Medications Prior to Admission  Medication Sig Dispense Refill Last Dose  . albuterol (PROVENTIL) (2.5 MG/3ML) 0.083% nebulizer solution Take 6 mLs (5 mg total) by nebulization every 6 (six) hours as needed for wheezing or shortness of breath. 75 mL 12 10/17/2017 at am  . Aspirin-Salicylamide-Caffeine (BC HEADACHE POWDER PO) Take 1 packet by mouth 2 (two) times daily as needed (pain/headache).   couple days ago  . fluticasone (FLONASE) 50 MCG/ACT nasal spray Place 2 sprays into both nostrils daily. (Patient not taking: Reported on 10/17/2017) 16 g 0 Not Taking at Unknown time  . fluticasone furoate-vilanterol (BREO ELLIPTA) 200-25 MCG/INH AEPB Inhale 1 puff into the lungs daily. 60 each 5 not yet filled  . loratadine (CLARITIN) 10 MG tablet Take 1 tablet (10 mg total) by mouth daily. (Patient not taking: Reported on 10/17/2017) 20 tablet 0 Not Taking at Unknown time  . predniSONE (DELTASONE) 10 MG tablet 6 tabs x 3 days, 5 tabs x 3 days, 4 tabs x 3 days, 3 tabs x 3 days, 2 tabs x 3 days, 1 tab x 3 days then stop (Patient taking differently: Take 10-60 mg by mouth See admin instructions. Take 6 tabs (60 mg) by mouth daily for 3 days, then take 5 tabs (50 mg) daily for 3 days, then take 4 tabs (40 mg) daily for 3 days, then take 3 tabs (30 mg) daily for 3 days, then take 2 tabs (20 mg) daily for 3 days, then take 1 tab (10 mg) daily for 3 days, then stop) 63 tablet 0 not yet filled    HOSPITAL MEDICATIONS: . benzonatate  200 mg Oral TID  . dextromethorphan-guaiFENesin  1 tablet Oral BID  . diphenhydrAMINE  25 mg Oral QHS  . guaiFENesin  600 mg Oral BID  . methylPREDNISolone (SOLU-MEDROL) injection  40 mg Intravenous Q6H  . mometasone-formoterol  2 puff Inhalation BID  . multivitamin with minerals  1 tablet Oral Daily  . pantoprazole  40 mg Oral Daily  . protein supplement shake  11 oz Oral TID BM  . sodium chloride flush  3 mL  Intravenous Q12H  . traMADol  100 mg Oral Q6H    ROS.  Positive in bold. General: Negative; No fevers, chills,  night sweats;  HEENT: Blurry vision, occasional headache,No sinus congestion, difficulty swallowing Pulmonary:  cough, wheezing, shortness of breath, hemoptysis Cardiovascular: Pleuritic chest pain, presyncope, syncope, palpitations GI: Negative; No nausea, vomiting, diarrhea, or abdominal pain, decreased appetite. GU: Negative; No dysuria, hematuria, or difficulty voiding Musculoskeletal: Negative; no myalgias, joint pain, or weakness Hematologic/Oncology: Negative; no easy bruising, bleeding Endocrine: Negative; no heat/cold intolerance; no diabetes Neuro: Negative; no changes in balance, headaches Skin: Negative; No rashes or skin lesions Psychiatric: Negative; No behavioral problems, depression Other comprehensive 14 point system review is negative.  VITALS: Blood pressure 133/86, pulse 99, temperature 98 F (36.7 C), temperature source Oral, resp. rate 18, height 5' 11" (1.803 m), weight 155 lb 6.4 oz (70.5 kg), SpO2 100 %.  PHYSICAL EXAM:  General: Vital signs reviewed.  Patient is well-developed and well-nourished, in no acute distress and cooperative with exam.  Head: Normocephalic and atraumatic. Eyes: EOMI, conjunctivae normal, no scleral icterus.  Neck: Supple, trachea midline, normal ROM, no JVD, masses, thyromegaly, or carotid bruit present.  Cardiovascular: RRR, S1 normal, S2 normal, no murmurs, gallops, or rubs. Pulmonary/Chest: Coarse breath sounds with bilateral expiratory wheeze. Abdominal: Soft, non-tender, non-distended, BS +, no masses, organomegaly, or guarding present.  Extremities: No lower extremity edema bilaterally,  pulses symmetric and intact bilaterally. No cyanosis or clubbing. Neurological: A&O x3, Strength is normal and symmetric bilaterally, cranial nerve II-XII are grossly intact, no focal motor deficit, sensory intact to light touch  bilaterally.  Skin: Warm, dry and intact. No rashes or erythema. Psychiatric: Normal mood and affect. speech and behavior is normal. Cognition and memory are normal.  ECG (independently read by me): Normal sinus rhythm with nonspecific T wave changes.  LABS: Results for orders placed or performed during the hospital encounter of 10/17/17 (from the past 48 hour(s))  CBC     Status: Abnormal   Collection Time: 10/22/17  4:53 AM  Result Value Ref Range   WBC 12.9 (H) 4.0 - 10.5 K/uL   RBC 5.20 4.22 - 5.81 MIL/uL   Hemoglobin 15.8 13.0 - 17.0 g/dL   HCT 47.0 39.0 - 52.0 %   MCV 90.4 78.0 - 100.0 fL   MCH 30.4 26.0 - 34.0 pg   MCHC 33.6 30.0 - 36.0 g/dL   RDW 12.4 11.5 - 15.5 %   Platelets 298 150 - 400 K/uL    Comment: Performed at [facility] [address], [place], [place] [address]  Basic metabolic panel     Status: Abnormal   Collection Time: 10/22/17  4:53 AM  Result Value Ref Range   Sodium 135 135 - 145 mmol/L   Potassium 4.4 3.5 - 5.1 mmol/L   Chloride 99 (L) 101 - 111 mmol/L   CO2 31 22 - 32 mmol/L   Glucose, Bld 130 (H) 65 - 99 mg/dL   BUN 17 6 - 20 mg/dL   Creatinine, Ser 0.84 0.61 - 1.24 mg/dL   Calcium 9.2 8.9 - 10.3 mg/dL   GFR calc non Af Amer >60 >60 mL/min   GFR calc Af Amer >60 >60 mL/min    Comment: (NOTE) The eGFR has been calculated using the CKD EPI equation. This calculation has not been validated in all clinical situations. eGFR's persistently <60 mL/min signify possible Chronic Kidney Disease.    Anion gap 5 5 - 15    Comment: Performed at [facility] [address], [place], [place] [address]  Magnesium     Status: None   Collection Time: 10/22/17  4:53 AM  Result Value Ref Range   Magnesium 2.3 1.7 - 2.4 mg/dL    Comment: Performed at [facility] [address] [place], [place] [address]  Lactic acid, plasma     Status: Abnormal   Collection Time: 10/22/17  4:53 AM  Result Value Ref Range   Lactic Acid, Venous 2.7 (HH)  0.5 - 1.9 mmol/L    Comment: CRITICAL RESULT CALLED TO, READ BACK BY AND VERIFIED WITHNoah Charon [id number] [place] Performed at [facility], [place] [address], [place], [place] [address]   Glucose, capillary     Status: Abnormal   Collection Time: 10/22/17  7:49 AM  Result Value Ref Range   Glucose-Capillary 103 (H) 65 - 99 mg/dL   Comment 1 Notify RN   Basic metabolic panel     Status: Abnormal   Collection Time: 10/23/17  3:52 AM  Result  Value Ref Range   Sodium 134 (L) 135 - 145 mmol/L   Potassium 4.6 3.5 - 5.1 mmol/L   Chloride 96 (L) 101 - 111 mmol/L   CO2 31 22 - 32 mmol/L   Glucose, Bld 122 (H) 65 - 99 mg/dL   BUN 21 (H) 6 - 20 mg/dL   Creatinine, Ser 0.85 0.61 - 1.24 mg/dL   Calcium 9.7 8.9 - 10.3 mg/dL   GFR calc non Af Amer >60 >60 mL/min   GFR calc Af Amer >60 >60 mL/min    Comment: (NOTE) The eGFR has been calculated using the CKD EPI equation. This calculation has not been validated in all clinical situations. eGFR's persistently <60 mL/min signify possible Chronic Kidney Disease.    Anion gap 7 5 - 15    Comment: Performed at [facility] [address], [place], [place] [address]  Glucose, capillary     Status: Abnormal   Collection Time: 10/23/17  7:52 AM  Result Value Ref Range   Glucose-Capillary 131 (H) 65 - 99 mg/dL    IMAGING: Ct Chest W Contrast  Result Date: 10/22/2017 CLINICAL DATA:  Chest pain or shortness of breath. Pleurisy or effusion suspected. Patient reports hemoptysis, dyspnea and wheezing. EXAM: CT CHEST WITH CONTRAST TECHNIQUE: Multidetector CT imaging of the chest was performed during intravenous contrast administration. CONTRAST:  179m OMNIPAQUE IOHEXOL 300 MG/ML  SOLN COMPARISON:  Radiographs yesterday and over the past week. FINDINGS: Cardiovascular: No significant vascular findings. Normal heart size. No pericardial effusion. Mediastinum/Nodes: No enlarged mediastinal or hilar lymph nodes. No mediastinal mass. The esophagus  is decompressed. No thyroid nodule. Lungs/Pleura: Minimal dependent atelectasis in both lower lobes. Mild central bronchial thickening. Trachea and mainstem bronchi are otherwise patent. No confluent airspace disease. No pneumothorax or pulmonary mass. No pulmonary edema. No pleural fluid. Upper Abdomen: No acute abnormality. Musculoskeletal: There are no acute or suspicious osseous abnormalities. IMPRESSION: Minimal central bronchial thickening and dependent atelectasis in both lower lobes. No pleural effusion or other explanation for shortness of breath. Electronically Signed   By: MJeb LeveringM.D.   On: 10/22/2017 23:10   ECHO. Study Conclusions  - Procedure narrative: Transthoracic echocardiography. Image   quality was adequate. The study was technically difficult. - Left ventricle: The cavity size was normal. Wall thickness was   normal. Systolic function was vigorous. The estimated ejection   fraction was in the range of 65% to 70%. LV apical false tendon   (normal variant). Wall motion was normal; there were no regional   wall motion abnormalities. Left ventricular diastolic function   parameters were normal. - Left atrium: The atrium was normal in size. - Right atrium: The atrium was mildly dilated. - Inferior vena cava: The vessel was normal in size. The   respirophasic diameter changes were in the normal range (>= 50%),   consistent with normal central venous pressure.  Impressions:  - Normal study.  IMPRESSION: No structural heart disease, no ventricular arrhythmia on telemetry.  Most likely having syncopal episode secondary to hypoxia.  RECOMMENDATION: -Continue monitoring. -Treat his underlying lung disease. -May need further work-up for his significant weight loss. -Do not think that he is having any cardiac related syncopal episode.  ALorella Nimrod MD  10/23/2017 5:12 PM   I have seen and examined the patient along with ALorella Nimrod MD.  I have reviewed the  chart, notes and new data.  I agree with her note.  Key new complaints: he continues to have  wheezing and is very worried about his condition.  Right now he does not have any dizziness or palpitations or sensation of near syncope.  He reports 2 syncopal events since coming to the hospital.  The first occurred while he was in triage and he was not yet on the telemetry monitor.  The second occurred 2 evenings ago per his report, although I do not find clear documentation of this in the chart.  I have reviewed his telemetry strips and do not see any evidence of arrhythmia other than sinus tachycardia. Key examination changes: He appears very healthy and fit, normal cardiovascular exam except for mild resting tachycardia.  Prominent bilateral end-expiratory wheezing but moving air well. Key new findings / data: Normal echocardiogram, normal ECG.  Normal QT interval.  All telemetry monitor strips reviewed show sinus rhythm or sinus tachycardia.  PLAN: Mr. Apfel does not have any evidence of structural heart disease. No ventricular arrhythmia has been documented since admission.  Normal QT. While it is conceivable that he may have beta adrenergic agonist induced ventricular arrhythmia causing syncope, it appears much more likely that he had hypoxic syncope from severe asthma and/or cough syncope. We will continue treating on telemetry while in the hospital.  If he has another syncopal event he would be very useful to firmly documented the rhythm at the time of this event. I do not think he needs an implantable loop recorder. It is not unreasonable for him to wear a 14-day event monitor after discharge, but I suspect this will show only sinus rhythm and sinus tachycardia. No further cardiac work-up planned at this time.  Sanda Klein, MD, [place] [phone] 10/23/2017, 6:51 PM

## 2017-10-23 NOTE — Progress Notes (Signed)
[facility]                                                                              Patient Demographics  Shannon Knapp, is a 32 y.o. male, DOB - Apr 05, 1986, JYN:[id number]  Admit date - 10/17/2017   Admitting Physician Lahoma Crocker, MD  Outpatient Primary MD for the patient is Patient, No Pcp Per  Outpatient specialists:   LOS - 6  days   Medical records reviewed and are as summarized below:    Chief Complaint  Patient presents with  . COPD       Brief summary   Patient is a 32 year old male with history of asthma, past smoker, quit March 2019, presented to pulmonary office for follow-up with Dr. Isaiah Serge, had reported 3 days of dyspnea, wheezing with coughing, mucus phlegm and hemoptysis.  Patient was sent to ED for further work-up.  CCM has been following.  Assessment & Plan    Principal Problem:   Severe persistent acute asthmatic bronchitis - patient also reports family history of asthma with father, uncle and now his son, did have asthma as a child -Work-up so far negative, L5 antitrypsin negative, HIV negative, eosinophils within normal limits, chest x-ray shows no infiltrates, sputum culture showed normal flora.  Respiratory virus panel negative -currently on as needed Xopenex, Dulera, Mucinex -continue tessalon Perles, Hycodan -2D echo showed EF 65 to 70% with normal diastolic function, no regional wall motion abnormality. -CT chest showed minimal central bronchial thickening and dependent atelectasis in the both lower lobe -Still wheezing, will follow pulmonology recommendations  Active Problems:    Hemoptysis -Likely due to acute tracheobronchitis from #1, continue Hycodan, Tessalon Perles -Continue SCDs   Sinus tachycardia -Improving, continue Xopenex as needed  Code Status: Full CODE STATUS DVT Prophylaxis: SCD's Family Communication: Discussed in detail with the patient, all imaging results, lab results explained to the  patient and fiance   Disposition Plan: When cleared by pulmonology  Time Spent in minutes 25 minutes  Procedures:  2D echo EF 65 to 70%, normal motion abnormalities  Consultants:   Pulmonary  Antimicrobials:      Medications  Scheduled Meds: . benzonatate  200 mg Oral TID  . dextromethorphan-guaiFENesin  1 tablet Oral BID  . diphenhydrAMINE  25 mg Oral QHS  . guaiFENesin  600 mg Oral BID  . methylPREDNISolone (SOLU-MEDROL) injection  40 mg Intravenous Q6H  . mometasone-formoterol  2 puff Inhalation BID  . pantoprazole  40 mg Oral Daily  . protein supplement shake  11 oz Oral TID BM  . sodium chloride flush  3 mL Intravenous Q12H  . traMADol  100 mg Oral Q6H   Continuous Infusions: . sodium chloride     PRN Meds:.sodium chloride, acetaminophen **OR** acetaminophen, hydrALAZINE, HYDROcodone-homatropine, levalbuterol, ondansetron **OR** ondansetron (ZOFRAN) IV, sodium chloride flush, traMADol   Antibiotics   Anti-infectives (From admission, onward)   Start     Dose/Rate Route Frequency Ordered Stop   10/18/17 1800  levofloxacin (LEVAQUIN) tablet 750 mg  Status:  Discontinued     750 mg Oral Daily 10/18/17 1033 10/18/17 1039   10/18/17 1800  levofloxacin (LEVAQUIN) tablet 500 mg     500 mg Oral Daily 10/18/17 1039 10/21/17 1646   10/17/17 1800  levofloxacin (LEVAQUIN) IVPB 750 mg  Status:  Discontinued     750 mg 100 mL/hr over 90 Minutes Intravenous Every 24 hours 10/17/17 1620 10/18/17 1033        Subjective:   Shannon Knapp was seen and examined today.  Still having wheezing, coughing.  No fevers or chills.  Patient denies dizziness, N/V/D/C, new weakness, numbess, tingling.   Objective:   Vitals:   10/22/17 2030 10/22/17 2040 10/23/17 0546 10/23/17 0819  BP: (!) 156/96  (!) 131/92   Pulse: 86 84 68   Resp: 18 18 18    Temp: 98 F (36.7 C)  98.2 F (36.8 C)   TempSrc: Oral  Oral   SpO2: 98% 98% 98% 97%  Weight:   70.5 kg (155 lb 6.4 oz)     Height:        Intake/Output Summary (Last 24 hours) at 10/23/2017 1015 Last data filed at 10/23/2017 0847 Gross per 24 hour  Intake 1525 ml  Output 1925 ml  Net -400 ml     Wt Readings from Last 3 Encounters:  10/23/17 70.5 kg (155 lb 6.4 oz)  10/17/17 74 kg (163 lb 3.2 oz)  09/07/17 77.6 kg (171 lb)     Exam Physical Exam  General: Alert and oriented x 3, NAD  Eyes:  HEENT:  Atraumatic, normocephalic  Cardiovascular: S1 S2 auscultated, Regular rate and rhythm. No pedal edema b/l  Respiratory: Bilateral expiratory wheezing  Gastrointestinal: Soft, nontender, nondistended, + bowel sounds  Ext: no pedal edema bilaterally  Neuro: no new deficit  Musculoskeletal: No digital cyanosis, clubbing  Skin: No rashes  Psych: Normal affect and demeanor, alert and oriented x3    Data Reviewed:  I have personally reviewed following labs and imaging studies  Micro Results Recent Results (from the past 240 hour(s))  Respiratory Panel by PCR     Status: None   Collection Time: 10/18/17 11:12 AM  Result Value Ref Range Status   Adenovirus NOT DETECTED NOT DETECTED Final   Coronavirus 229E NOT DETECTED NOT DETECTED Final   Coronavirus HKU1 NOT DETECTED NOT DETECTED Final   Coronavirus NL63 NOT DETECTED NOT DETECTED Final   Coronavirus OC43 NOT DETECTED NOT DETECTED Final   Metapneumovirus NOT DETECTED NOT DETECTED Final   Rhinovirus / Enterovirus NOT DETECTED NOT DETECTED Final   Influenza A NOT DETECTED NOT DETECTED Final   Influenza B NOT DETECTED NOT DETECTED Final   Parainfluenza Virus 1 NOT DETECTED NOT DETECTED Final   Parainfluenza Virus 2 NOT DETECTED NOT DETECTED Final   Parainfluenza Virus 3 NOT DETECTED NOT DETECTED Final   Parainfluenza Virus 4 NOT DETECTED NOT DETECTED Final   Respiratory Syncytial Virus NOT DETECTED NOT DETECTED Final   Bordetella pertussis NOT DETECTED NOT DETECTED Final   Chlamydophila pneumoniae NOT DETECTED NOT DETECTED Final    Mycoplasma pneumoniae NOT DETECTED NOT DETECTED Final    Comment: Performed at [facility], [address], [place], [place] [address]  Culture, sputum-assessment     Status: None   Collection Time: 10/18/17 11:23 AM  Result Value Ref Range Status   Specimen Description SPUTUM  Final   Special Requests NONE  Final   Sputum evaluation   Final    THIS SPECIMEN IS ACCEPTABLE FOR SPUTUM CULTURE Performed at [facility], [address], [place], [place] [address]  Report Status 10/18/2017 FINAL  Final  Culture, respiratory (NON-Expectorated)     Status: None   Collection Time: 10/18/17 11:23 AM  Result Value Ref Range Status   Specimen Description SPUTUM  Final   Special Requests NONE Reflexed from [id number]  Final   Gram Stain   Final    RARE WBC PRESENT, PREDOMINANTLY PMN FEW YEAST FEW GRAM POSITIVE COCCI IN CHAINS FEW GRAM POSITIVE RODS RARE GRAM NEGATIVE RODS    Culture   Final    Consistent with normal respiratory flora. Performed at [facility], [address], [place], [place] [address]    Report Status 10/20/2017 FINAL  Final    Radiology Reports Dg Chest 2 View  Result Date: 10/17/2017 CLINICAL DATA:  Cough EXAM: CHEST - 2 VIEW COMPARISON:  None. FINDINGS: Lungs are hyperexpanded. There is no edema or consolidation. Heart size and pulmonary vascularity are normal. No adenopathy. No evident bone lesions. IMPRESSION: Lungs hyperexpanded without edema or consolidation. Heart size normal. Electronically Signed   By: Bretta Bang III M.D.   On: 10/17/2017 12:29   Ct Chest W Contrast  Result Date: 10/22/2017 CLINICAL DATA:  Chest pain or shortness of breath. Pleurisy or effusion suspected. Patient reports hemoptysis, dyspnea and wheezing. EXAM: CT CHEST WITH CONTRAST TECHNIQUE: Multidetector CT imaging of the chest was performed during intravenous contrast administration. CONTRAST:  OMNIPAQUE IOHEXOL 300 MG/ML  SOLN COMPARISON:  Radiographs yesterday  and over the past week. FINDINGS: Cardiovascular: No significant vascular findings. Normal heart size. No pericardial effusion. Mediastinum/Nodes: No enlarged mediastinal or hilar lymph nodes. No mediastinal mass. The esophagus is decompressed. No thyroid nodule. Lungs/Pleura: Minimal dependent atelectasis in both lower lobes. Mild central bronchial thickening. Trachea and mainstem bronchi are otherwise patent. No confluent airspace disease. No pneumothorax or pulmonary mass. No pulmonary edema. No pleural fluid. Upper Abdomen: No acute abnormality. Musculoskeletal: There are no acute or suspicious osseous abnormalities. IMPRESSION: Minimal central bronchial thickening and dependent atelectasis in both lower lobes. No pleural effusion or other explanation for shortness of breath. Electronically Signed   By: Rubye Oaks M.D.   On: 10/22/2017 23:10   Dg Chest Port 1 View  Result Date: 10/21/2017 CLINICAL DATA:  Shortness of breath, cough EXAM: PORTABLE CHEST 1 VIEW COMPARISON:  10/18/2017 FINDINGS: Heart and mediastinal contours are within normal limits. No focal opacities or effusions. No acute bony abnormality. IMPRESSION: No active disease. Electronically Signed   By: Charlett Nose M.D.   On: 10/21/2017 07:52   Dg Chest Port 1 View  Result Date: 10/18/2017 CLINICAL DATA:  32 year old male with asthma exacerbation EXAM: PORTABLE CHEST 1 VIEW COMPARISON:  Prior chest x-ray 10/17/2017 FINDINGS: The lungs are clear and negative for focal airspace consolidation, pulmonary edema or suspicious pulmonary nodule. The degree of pulmonary hyperinflation is similar compared to prior. No pleural effusion or pneumothorax. Cardiac and mediastinal contours are within normal limits. No acute fracture or lytic or blastic osseous lesions. The visualized upper abdominal bowel gas pattern is unremarkable. IMPRESSION: 1. No acute cardiopulmonary process. 2. Similar degree of pulmonary hyperinflation. Electronically Signed    By: Malachy Moan M.D.   On: 10/18/2017 12:34   Dg Chest Portable 1 View  Result Date: 10/17/2017 CLINICAL DATA:  Shortness of breath today. EXAM: PORTABLE CHEST 1 VIEW COMPARISON:  PA and lateral chest 10/17/2017. FINDINGS: The chest is hyperexpanded but the lungs are clear. No pneumothorax or pleural effusion. Heart size is normal. No acute bony abnormality. IMPRESSION: Pulmonary hyperexpansion.  Lungs are clear. Electronically Signed   By: Drusilla Kannerhomas  Dalessio M.D.   On: 10/17/2017 15:38    Lab Data:  CBC: Recent Labs  Lab 10/17/17 1811 10/18/17 0719 10/19/17 0022 10/20/17 0048 10/21/17 0528 10/22/17 0453  WBC 7.6 8.4 17.2* 14.9* 13.2* 12.9*  NEUTROABS 5.9 6.5 15.1* 13.3* 10.9*  --   HGB 15.0 15.6 15.2 15.3 14.6 15.8  HCT 44.9 47.2 46.3 46.2 43.5 47.0  MCV 90.5 90.9 91.3 91.8 90.8 90.4  PLT 267 296 324 327 265 298   Basic Metabolic Panel: Recent Labs  Lab 10/18/17 0719 10/19/17 0022 10/20/17 0048 10/21/17 0528 10/22/17 0453 10/23/17 0352  NA 136 137  --  134* 135 134*  K 4.9 5.1  --  5.1 4.4 4.6  CL 100* 100*  --  98* 99* 96*  CO2 26 28  --  30 31 31   GLUCOSE 146* 131*  --  121* 130* 122*  BUN 6 11  --  17 17 21*  CREATININE 0.93 1.11  --  0.75 0.84 0.85  CALCIUM 9.6 9.3  --  9.3 9.2 9.7  MG  --  2.2 2.4  --  2.3  --   PHOS  --  3.8 3.0  --   --   --    GFR: Estimated Creatinine Clearance: 123.3 mL/min (by C-G formula based on SCr of 0.85 mg/dL). Liver Function Tests: Recent Labs  Lab 10/19/17 0022  AST 26  ALT 29  ALKPHOS 66  BILITOT 0.7  PROT 6.9  ALBUMIN 3.8   No results for input(s): LIPASE, AMYLASE in the last 168 hours. No results for input(s): AMMONIA in the last 168 hours. Coagulation Profile: No results for input(s): INR, PROTIME in the last 168 hours. Cardiac Enzymes: Recent Labs  Lab 10/20/17 0048  CKTOTAL 285  CKMB 7.8*   BNP (last 3 results) No results for input(s): PROBNP in the last 8760 hours. HbA1C: No results for input(s):  HGBA1C in the last 72 hours. CBG: Recent Labs  Lab 10/20/17 1131 10/20/17 1624 10/20/17 2059 10/22/17 0749 10/23/17 0752  GLUCAP 95 122* 111* 103* 131*   Lipid Profile: No results for input(s): CHOL, HDL, LDLCALC, TRIG, CHOLHDL, LDLDIRECT in the last 72 hours. Thyroid Function Tests: No results for input(s): TSH, T4TOTAL, FREET4, T3FREE, THYROIDAB in the last 72 hours. Anemia Panel: No results for input(s): VITAMINB12, FOLATE, FERRITIN, TIBC, IRON, RETICCTPCT in the last 72 hours. Urine analysis:    Component Value Date/Time   COLORURINE STRAW (A) 10/18/2017 0751   APPEARANCEUR CLEAR 10/18/2017 0751   LABSPEC 1.006 10/18/2017 0751   PHURINE 6.0 10/18/2017 0751   GLUCOSEU 50 (A) 10/18/2017 0751   HGBUR NEGATIVE 10/18/2017 0751   BILIRUBINUR NEGATIVE 10/18/2017 0751   KETONESUR NEGATIVE 10/18/2017 0751   PROTEINUR NEGATIVE 10/18/2017 0751   NITRITE NEGATIVE 10/18/2017 0751   LEUKOCYTESUR NEGATIVE 10/18/2017 0751     Lauran Romanski M.D. [facility] 10/23/2017, 10:15 AM  Pager: [phone] Between 7am to 7pm - call Pager - [phone]  After 7pm go to [email or web address] - password [email or web address]  Call night coverage person covering after 7pm

## 2017-10-24 ENCOUNTER — Inpatient Hospital Stay (HOSPITAL_COMMUNITY): Payer: Self-pay

## 2017-10-24 ENCOUNTER — Encounter (HOSPITAL_COMMUNITY)
Admission: EM | Disposition: A | Discharge status: Home / Self Care | Payer: Self-pay | Source: Home / Self Care | Attending: Internal Medicine

## 2017-10-24 ENCOUNTER — Other Ambulatory Visit: Payer: Self-pay | Admitting: Cardiology

## 2017-10-24 DIAGNOSIS — R55 Syncope and collapse: Secondary | ICD-10-CM

## 2017-10-24 HISTORY — PX: VIDEO BRONCHOSCOPY: SHX5072

## 2017-10-24 LAB — BASIC METABOLIC PANEL
Anion gap: 10 (ref 5–15)
BUN: 20 mg/dL (ref 6–20)
CHLORIDE: 96 mmol/L — AB (ref 101–111)
CO2: 29 mmol/L (ref 22–32)
CREATININE: 0.88 mg/dL (ref 0.61–1.24)
Calcium: 9.5 mg/dL (ref 8.9–10.3)
GFR calc Af Amer: >60 mL/min (ref >60)
GFR calc non Af Amer: >60 mL/min (ref >60)
GLUCOSE: 114 mg/dL — AB (ref 65–99)
POTASSIUM: 4.6 mmol/L (ref 3.5–5.1)
Sodium: 135 mmol/L (ref 135–145)

## 2017-10-24 LAB — GLUCOSE, CAPILLARY
GLUCOSE-CAPILLARY: 118 mg/dL — AB (ref 65–99)
Glucose-Capillary: 110 mg/dL — ABNORMAL HIGH (ref 65–99)

## 2017-10-24 SURGERY — VIDEO BRONCHOSCOPY WITHOUT FLUORO
Anesthesia: Moderate Sedation | Laterality: Bilateral

## 2017-10-24 MED ORDER — HYDROCODONE-HOMATROPINE 5-1.5 MG/5ML PO SYRP
5.0000 mL | ORAL_SOLUTION | ORAL | Status: AC | PRN
  Administered 2017-10-24: 5 mL via ORAL
  Filled 2017-10-24: qty 5

## 2017-10-24 MED ORDER — MIDAZOLAM HCL 5 MG/ML IJ SOLN
INTRAMUSCULAR | Status: AC
  Filled 2017-10-24: qty 2

## 2017-10-24 MED ORDER — PHENYLEPHRINE HCL 0.25 % NA SOLN: 1.0000 | Freq: Four times a day (QID) | NASAL | Status: DC | PRN

## 2017-10-24 MED ORDER — LIDOCAINE HCL URETHRAL/MUCOSAL 2 % EX GEL
CUTANEOUS | Status: DC | PRN
  Administered 2017-10-24: 1

## 2017-10-24 MED ORDER — LIDOCAINE HCL 2 % EX GEL: 1.0000 "application " | Freq: Once | CUTANEOUS | Status: DC

## 2017-10-24 MED ORDER — NYSTATIN 100000 UNIT/ML MT SUSP
5.0000 mL | Freq: Four times a day (QID) | OROMUCOSAL | Status: DC
  Administered 2017-10-24 – 2017-10-30 (×24): 500000 [IU] via OROMUCOSAL
  Filled 2017-10-24 (×24): qty 5

## 2017-10-24 MED ORDER — PHENYLEPHRINE HCL 0.25 % NA SOLN
NASAL | Status: DC | PRN
  Administered 2017-10-24: 2 via NASAL

## 2017-10-24 MED ORDER — MIDAZOLAM HCL 10 MG/2ML IJ SOLN
INTRAMUSCULAR | Status: DC | PRN
  Administered 2017-10-24: 1 mg via INTRAVENOUS
  Administered 2017-10-24: .5 mg via INTRAVENOUS
  Administered 2017-10-24: 1 mg via INTRAVENOUS

## 2017-10-24 MED ORDER — FENTANYL CITRATE (PF) 100 MCG/2ML IJ SOLN
INTRAMUSCULAR | Status: AC
  Filled 2017-10-24: qty 4

## 2017-10-24 MED ORDER — SODIUM CHLORIDE 0.9 % IV SOLN
INTRAVENOUS | Status: DC
  Administered 2017-10-24: 15:00:00 via INTRAVENOUS

## 2017-10-24 MED ORDER — LIDOCAINE HCL (PF) 1 % IJ SOLN
INTRAMUSCULAR | Status: DC | PRN
  Administered 2017-10-24: 6 mL

## 2017-10-24 MED ORDER — FENTANYL CITRATE (PF) 100 MCG/2ML IJ SOLN
INTRAMUSCULAR | Status: DC | PRN
  Administered 2017-10-24: 25 ug via INTRAVENOUS
  Administered 2017-10-24: 50 ug via INTRAVENOUS
  Administered 2017-10-24: 25 ug via INTRAVENOUS
  Administered 2017-10-24: 50 ug via INTRAVENOUS

## 2017-10-24 MED ORDER — FLUCONAZOLE 100 MG PO TABS
100.0000 mg | ORAL_TABLET | Freq: Every day | ORAL | Status: AC
  Administered 2017-10-24 – 2017-10-28 (×5): 100 mg via ORAL
  Filled 2017-10-24 (×5): qty 1

## 2017-10-24 NOTE — Procedures (Signed)
Bronchoscopy Procedure Note Shannon BennettRayshawn M Knapp [id number] 05/09/1986  Procedure: Bronchoscopy Indications: Diagnostic evaluation of the airways and Obtain specimens for culture and/or other diagnostic studies  Procedure Details Consent: Risks of procedure as well as the alternatives and risks of each were explained to the (patient/caregiver).  Consent for procedure obtained. Time Out: Verified patient identification, verified procedure, site/side was marked, verified correct patient position, special equipment/implants available, medications/allergies/relevent history reviewed, required imaging and test results available.  Performed  In preparation for procedure, bronchoscope lubricated. Sedation: Benzodiazepines and Fentanyl  Airway entered and the following bronchi were examined: RUL, RML, RLL, LUL, LLL and Bronchi.   No endobronchial lesions noted, vocal cords and bronchial mucosa are essentially clean.  Thrush noted on the anterior aspect to the epiglottis. Bronchoscope removed.    Evaluation Hemodynamic Status: BP stable throughout; O2 sats: stable throughout Patient's Current Condition: stable Specimens:  Sent purulent fluid Complications: No apparent complications Patient did tolerate procedure well.   Shannon BoundYACOUB,Shannon Knapp 10/24/2017

## 2017-10-24 NOTE — Progress Notes (Signed)
Video bronchoscopy performed Intervention bronchial washings Pt tolerated well  Marvalene Barrett David RRT  

## 2017-10-24 NOTE — Progress Notes (Addendum)
Progress Note  Patient Name: Shannon Knapp Date of Encounter: 10/24/2017  Primary Cardiologist: No primary care provider on file.   Subjective   Currently stable on tele floor. No syncope in past 48 hrs. Still with intermitted sinus tach and diffuse expiratory wheezing on lung exam. Normal work of breathing. No respiratory distress. He has a lot of concerns. Wants a diagnosis for his lung issues and syncope.   Inpatient Medications    Scheduled Meds: . benzonatate  200 mg Oral TID  . dextromethorphan-guaiFENesin  1 tablet Oral BID  . diphenhydrAMINE  25 mg Oral QHS  . guaiFENesin  600 mg Oral BID  . methylPREDNISolone (SOLU-MEDROL) injection  40 mg Intravenous Q6H  . mometasone-formoterol  2 puff Inhalation BID  . multivitamin with minerals  1 tablet Oral Daily  . pantoprazole  40 mg Oral Daily  . protein supplement shake  11 oz Oral TID BM  . sodium chloride flush  3 mL Intravenous Q12H  . traMADol  100 mg Oral Q6H   Continuous Infusions: . sodium chloride     PRN Meds: sodium chloride, acetaminophen **OR** acetaminophen, hydrALAZINE, HYDROcodone-homatropine, levalbuterol, ondansetron **OR** ondansetron (ZOFRAN) IV, sodium chloride flush, traMADol   Vital Signs    Vitals:   10/23/17 2009 10/23/17 2021 10/24/17 0612 10/24/17 0804  BP:  117/70 (!) 142/96   Pulse:  70 84   Resp:  18 18   Temp:  (!) 97 F (36.1 C) 98.3 F (36.8 C)   TempSrc:   Oral   SpO2: 98% 100% 95% 97%  Weight:   156 lb 4.8 oz (70.9 kg)   Height:        Intake/Output Summary (Last 24 hours) at 10/24/2017 0849 Last data filed at 10/24/2017 0700 Gross per 24 hour  Intake 1525 ml  Output 1550 ml  Net -25 ml   Filed Weights   10/22/17 0554 10/23/17 0546 10/24/17 0612  Weight: 157 lb 11.2 oz (71.5 kg) 155 lb 6.4 oz (70.5 kg) 156 lb 4.8 oz (70.9 kg)    Telemetry    NSR - Personally Reviewed  ECG    NSR, with occasional sinus tach up to the 140s - Personally Reviewed  Physical Exam    GEN: No acute distress. Young athletic looking male Neck: No JVD Cardiac: RRR, no murmurs, rubs, or gallops.  Respiratory: diffuse expiratory rhonchi and wheezing  GI: Soft, nontender, non-distended  MS: No edema; No deformity. Neuro:  Nonfocal  Psych: Normal affect   Labs    Chemistry Recent Labs  Lab 10/19/17 0022  10/22/17 0453 10/23/17 0352 10/24/17 0648  NA 137   < > 135 134* 135  K 5.1   < > 4.4 4.6 4.6  CL 100*   < > 99* 96* 96*  CO2 28   < > 31 31 29   GLUCOSE 131*   < > 130* 122* 114*  BUN 11   < > 17 21* 20  CREATININE 1.11   < > 0.84 0.85 0.88  CALCIUM 9.3   < > 9.2 9.7 9.5  PROT 6.9  --   --   --   --   ALBUMIN 3.8  --   --   --   --   AST 26  --   --   --   --   ALT 29  --   --   --   --   ALKPHOS 66  --   --   --   --  BILITOT 0.7  --   --   --   --   GFRNONAA >60   < > >60 >60 >60  GFRAA >60   < > >60 >60 >60  ANIONGAP 9   < > 5 7 10    < > = values in this interval not displayed.     Hematology Recent Labs  Lab 10/20/17 0048 10/21/17 0528 10/22/17 0453  WBC 14.9* 13.2* 12.9*  RBC 5.03 4.79 5.20  HGB 15.3 14.6 15.8  HCT 46.2 43.5 47.0  MCV 91.8 90.8 90.4  MCH 30.4 30.5 30.4  MCHC 33.1 33.6 33.6  RDW 13.2 12.9 12.4  PLT 327 265 298    Cardiac EnzymesNo results for input(s): TROPONINI in the last 168 hours. No results for input(s): TROPIPOC in the last 168 hours.   BNPNo results for input(s): BNP, PROBNP in the last 168 hours.   DDimer No results for input(s): DDIMER in the last 168 hours.   Radiology    Ct Chest W Contrast  Result Date: 10/22/2017 CLINICAL DATA:  Chest pain or shortness of breath. Pleurisy or effusion suspected. Patient reports hemoptysis, dyspnea and wheezing. EXAM: CT CHEST WITH CONTRAST TECHNIQUE: Multidetector CT imaging of the chest was performed during intravenous contrast administration. CONTRAST:  OMNIPAQUE IOHEXOL 300 MG/ML  SOLN COMPARISON:  Radiographs yesterday and over the past week. FINDINGS:  Cardiovascular: No significant vascular findings. Normal heart size. No pericardial effusion. Mediastinum/Nodes: No enlarged mediastinal or hilar lymph nodes. No mediastinal mass. The esophagus is decompressed. No thyroid nodule. Lungs/Pleura: Minimal dependent atelectasis in both lower lobes. Mild central bronchial thickening. Trachea and mainstem bronchi are otherwise patent. No confluent airspace disease. No pneumothorax or pulmonary mass. No pulmonary edema. No pleural fluid. Upper Abdomen: No acute abnormality. Musculoskeletal: There are no acute or suspicious osseous abnormalities. IMPRESSION: Minimal central bronchial thickening and dependent atelectasis in both lower lobes. No pleural effusion or other explanation for shortness of breath. Electronically Signed   By: Rubye Oaks M.D.   On: 10/22/2017 23:10    Cardiac Studies   2D Echo  Study Conclusions  - Procedure narrative: Transthoracic echocardiography. Image quality was adequate. The study was technically difficult. - Left ventricle: The cavity size was normal. Wall thickness was normal. Systolic function was vigorous. The estimated ejection fraction was in the range of 65% to 70%. LV apical false tendon (normal variant). Wall motion was normal; there were no regional wall motion abnormalities. Left ventricular diastolic function parameters were normal. - Left atrium: The atrium was normal in size. - Right atrium: The atrium was mildly dilated. - Inferior vena cava: The vessel was normal in size. The respirophasic diameter changes were in the normal range (>= 50%), consistent with normal central venous pressure.  Impressions:  - Normal study.   Patient Profile     32 y.o. male former smoker with a medical history significant for COPD/asthma admitted on Oct 17, 2017 with worsening shortness of breath and wheezing, was treated with breathing treatments, steroid and antibiotics. Patient was complaining  of worsening exertional dyspnea, decreased appetite, unintentional weight loss of 30 pounds, daily night sweats for the past couple of months. Also with multiple syncopal episodes, for which cardiology was consulted. 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 Echo with no evidence of structural heart disease. Sinus tach on tel.   Assessment & Plan    1.  Status Asthmaticus: being managed by pulmonology/ CCM.   2. Syncope: pt notes history of syncope during this admission but no documented witnessed episodes reported by nursing staff or providers. Telemetry has shown intermittent sinus tachycardia, up to the 140s, but no other arrhthymias. Normal QT. 2D echo shows no evidence of structural heart disease. EF normal. Normal chamber size and wall thickness. No valvular disease and no pericardial effusion. We will plan on 14 day outpatient monitor post discharge and will arrange cardiology f/u. We will place order for monitor. Our office will call once discharged to arrange appt time for pickup.   3. Sinus Tach: intermittent, as high as the 140s. Currently low 100s. No BBs due to asthma.    For questions or updates, please contact [facility] Please consult [email or web address] for contact info under Cardiology/STEMI.      Signed, Robbie Lis, PA-C  10/24/2017, 8:49 AM    I have seen and examined the patient along with Robbie Lis, PA-C.   I have reviewed the chart, notes and new data.  I agree with PA's note.  Key new complaints: wheezing, no new syncope Key examination changes: wheezing better than yesterday Key new findings / data: reviewed all telemetry strips, including during reported syncope - no ventricular arrhythmia, only sinus tachycardia.  PLAN: No further cardiology inpatient workup planned. Outpatient event monitor is reasonable. Please call with additional problems.  Thurmon Fair, MD, [facility] [phone] 10/24/2017, 9:31 AM

## 2017-10-24 NOTE — Progress Notes (Signed)
Pt with a brief episode of ST in the 140s. Pt resting comfortably in bed. Telemetry strip printed and placed in chart. Will continue to monitor.

## 2017-10-24 NOTE — Progress Notes (Signed)
Order for outpatient event monitor placed. Our office will call pt to arrange appt time for pickup   Callum Wolf [facility]

## 2017-10-24 NOTE — Progress Notes (Deleted)
Discharge to home , husband at bedside. D/c instructions and follow up appointment discussed with patient and with husband. Verbalized understanding.

## 2017-10-24 NOTE — Progress Notes (Signed)
Name: Shannon Knapp MRN: [id number] DOB: 01/13/1986    ADMISSION DATE:  10/17/2017 CONSULTATION DATE:  10/17/2017  REFERRING MD :  Dr. Denton Lank   CHIEF COMPLAINT:  Dyspnea  brief 32 year old male with PMH of  Asthma, Past smoker (reportley quiet March 2019). Recent Visit to ED on 4/12 with purulent rhinorrhea, given doxycycline and 4/22 for cough/shortness of breath given steroid taper and nebs   5/31 Presented to Pulmonary Office (Dr. Isaiah Serge) with reported 3 days of dyspnea, white mucus production, and wheezing with noted hemoptysis. feno 95 . Given Severity of dyspnea and wheezing sent to ED for further work-up. Given 125 mg solu-medrol, magnesium 2 mg, albuterol 10 mg and 20 mg, in addition to Atrovent with minimal improvement. PCCM asked to consult.    SIGNIFICANT EVENTS  5/31 > Presents to ED from office  CXR 5/31 > The chest is hyperexpanded but the lungs are clear. No pneumothorax or pleural effusion. Heart size is normal. No acute bony abnormality. CXR 6/4> Heart and mediastinal contours are within normal limits. No focal opacities or effusions.   SUBJECTIVE/OVERNIGHT/INTERVAL HX Hemoptysis resolved but collapsed again in the shower today with a coughing spell  VITAL SIGNS: Temp:  [97 F (36.1 C)-98.3 F (36.8 C)] 98.2 F (36.8 C) (06/07 1435) Pulse Rate:  [66-135] 82 (06/07 1540) Resp:  [11-32] 12 (06/07 1540) BP: (117-165)/(70-107) 140/97 (06/07 1540) SpO2:  [94 %-100 %] 94 % (06/07 1540) Weight:  [156 lb 4.8 oz (70.9 kg)] 156 lb 4.8 oz (70.9 kg) (06/07 0612)  PHYSICAL EXAMINATION: General:  Young male, resting comfortably in exam bed, NAD HEENT: [place]/AT, PERRL, EOM-I and MMM Neuro: Awake and interactive, moving all ext to command CV: RRR, Nl S1/S2 and -M/R/G PULM: Expiratory wheezing, more pronounced at the larynx GI: Soft, NT, ND and +BS Extremities: Warm, intact Skin: No rashes  PULMONARY Recent Labs  Lab 10/18/17 0320  PHART 7.386  PCO2ART 42.3    PO2ART 78.5*  HCO3 24.8  O2SAT 95.0   CBC Recent Labs  Lab 10/20/17 0048 10/21/17 0528 10/22/17 0453  HGB 15.3 14.6 15.8  HCT 46.2 43.5 47.0  WBC 14.9* 13.2* 12.9*  PLT 327 265 298   COAGULATION No results for input(s): INR in the last 168 hours.  CARDIAC  No results for input(s): TROPONINI in the last 168 hours. No results for input(s): PROBNP in the last 168 hours.  CHEMISTRY Recent Labs  Lab 10/19/17 0022 10/20/17 0048 10/21/17 0528 10/22/17 0453 10/23/17 0352 10/24/17 0648  NA 137  --  134* 135 134* 135  K 5.1  --  5.1 4.4 4.6 4.6  CL 100*  --  98* 99* 96* 96*  CO2 28  --  30 31 31 29   GLUCOSE 131*  --  121* 130* 122* 114*  BUN 11  --  17 17 21* 20  CREATININE 1.11  --  0.75 0.84 0.85 0.88  CALCIUM 9.3  --  9.3 9.2 9.7 9.5  MG 2.2 2.4  --  2.3  --   --   PHOS 3.8 3.0  --   --   --   --    Estimated Creatinine Clearance: 119.7 mL/min (by C-G formula based on SCr of 0.88 mg/dL).  LIVER Recent Labs  Lab 10/19/17 0022  AST 26  ALT 29  ALKPHOS 66  BILITOT 0.7  PROT 6.9  ALBUMIN 3.8   INFECTIOUS Recent Labs  Lab 10/19/17 0022 10/20/17 0048 10/22/17 0453  LATICACIDVEN 3.7*  4.2* 2.7*   ENDOCRINE CBG (last 3)  Recent Labs    10/22/17 0749 10/23/17 0752 10/24/17 0754  GLUCAP 103* 131* 110*   I reviewed CXR myself, bilateral lower lobe opacification noted  ASSESSMENT / PLAN:  32 year old male with PMH of asthma who has had significant difficulty with asthma control since moving to .  Presenting to PCCM with SOB, status asthmaticus and hemoptysis.  On exam, continues to wheeze.  I reviewed CXR myself, no acute disease noted.  Discussed with PCCM-NP.  Bronch done today, showed evidence of thrush, picture on the chart, with no other abnormalities.  Status asthmaticus:  - Continue on solumedrol 40 mg IV q6  - D/C nebs, causes cough  - Mucinex  - Dulera  - Xopenex   - Abx as ordered, continue for now  Cough:  - D/C nebs as they exacerbate  cough  - Codeine for cough suppressant  - Tessalon pearls  - Add nyastatin for treatment of thrush  Hypoxemia:  - Titrate O2 for sat of 88-92%  Hemoptysis: resolved  - Cough suppressant as above  - Monitor clinically  Allergies:  - Anti-histamine as ordered  - Will need allergy testing as outpatient  - Check IgE level and eosinophil normal  Sinus tach: improving when not coughing  - Tele monitoring  - Xopenex from albuterol for trach  - Cards consult appreciated  Syncope  - Cardiology consult, not cardiac in origin  PCCM will continue to follow.  Alyson ReedyWesam G. Darin Redmann, M.D. [facility]. Pager: [phone]. After hours pager: [phone].

## 2017-10-24 NOTE — Progress Notes (Signed)
[facility]                                                                              Patient Demographics  Shannon Knapp, is a 32 y.o. male, DOB - 10-10-1985, RUE:[id number]  Admit date - 10/17/2017   Admitting Physician Lahoma Crocker, MD  Outpatient Primary MD for the patient is Patient, No Pcp Per  Outpatient specialists:   LOS - 7  days   Medical records reviewed and are as summarized below:    Chief Complaint  Patient presents with  . COPD       Brief summary   Patient is a 32 year old male with history of asthma, past smoker, quit March 2019, presented to pulmonary office for follow-up with Dr. Isaiah Serge, had reported 3 days of dyspnea, wheezing with coughing, mucus phlegm and hemoptysis.  Patient was sent to ED for further work-up.  CCM has been following.  Assessment & Plan    Principal Problem:   Severe persistent acute asthmatic bronchitis - patient also reports family history of asthma with father, uncle and now his son, did have asthma as a child -Work-up so far negative, L5 antitrypsin negative, HIV negative, eosinophils within normal limits, chest x-ray shows no infiltrates, sputum culture showed normal flora.  Respiratory virus panel negative -currently on as needed Xopenex, Dulera, Mucinex -continue tessalon Perles, Hycodan -2D echo showed EF 65 to 70% with normal diastolic function, no regional wall motion abnormality. -CT chest showed minimal central bronchial thickening and dependent atelectasis in the both lower lobe -Cardiology was consulted, appreciate recommendations.  Pulmonology following   Active Problems:  Syncope/near syncope - Patient had reported 2 syncopal events, cardiology was consulted, recommended outpatient event monitor, do not think he needs implantable loop recorder -At the time of my encounter, patient's wife reported that he was off the telemetry and was in shower when he felt a dizzy and almost passed out.   She held him and did not let him fall.  However they did not call for any help and hence the whole event was unwitnessed. -Orthostatic vitals were negative.      Hemoptysis -Likely due to acute tracheobronchitis from #1, continue Hycodan, Tessalon Perles -Continue SCDs   Sinus tachycardia -Improving, continue Xopenex as needed  Code Status: Full CODE STATUS DVT Prophylaxis: SCD's Family Communication: Discussed in detail with the patient, all imaging results, lab results explained to the patient and fiance   Disposition Plan: When cleared by pulmonology  Time Spent in minutes 25 minutes  Procedures:  2D echo EF 65 to 70%, normal motion abnormalities  Consultants:   Pulmonary  Antimicrobials:      Medications  Scheduled Meds: . [MAR Hold] benzonatate  200 mg Oral TID  . [MAR Hold] dextromethorphan-guaiFENesin  1 tablet Oral BID  . [MAR Hold] diphenhydrAMINE  25 mg Oral QHS  . [MAR Hold] guaiFENesin  600 mg Oral BID  . [MAR Hold] methylPREDNISolone (SOLU-MEDROL) injection  40 mg Intravenous Q6H  . [MAR Hold] mometasone-formoterol  2 puff Inhalation BID  . [MAR Hold] multivitamin with minerals  1 tablet Oral Daily  . [MAR Hold] pantoprazole  40 mg Oral Daily  . [MAR Hold] protein supplement shake  11 oz Oral TID BM  . [MAR Hold] sodium chloride flush  3 mL Intravenous Q12H  . [MAR Hold] traMADol  100 mg Oral Q6H   Continuous Infusions: . [MAR Hold] sodium chloride     PRN Meds:.[MAR Hold] sodium chloride, [MAR Hold] acetaminophen **OR** [MAR Hold] acetaminophen, [MAR Hold] hydrALAZINE, [MAR Hold] levalbuterol, [MAR Hold] ondansetron **OR** [MAR Hold] ondansetron (ZOFRAN) IV, [MAR Hold] sodium chloride flush, [MAR Hold] traMADol   Antibiotics   Anti-infectives (From admission, onward)   Start     Dose/Rate Route Frequency Ordered Stop   10/18/17 1800  levofloxacin (LEVAQUIN) tablet 750 mg  Status:  Discontinued     750 mg Oral Daily 10/18/17 1033 10/18/17 1039    10/18/17 1800  levofloxacin (LEVAQUIN) tablet 500 mg     500 mg Oral Daily 10/18/17 1039 10/21/17 1646   10/17/17 1800  levofloxacin (LEVAQUIN) IVPB 750 mg  Status:  Discontinued     750 mg 100 mL/hr over 90 Minutes Intravenous Every 24 hours 10/17/17 1620 10/18/17 1033        Subjective:   Shannon Knapp was seen and examined today.  Continues to have wheezing, wife giving all the history about the events leading to near syncope in the shower.  Patient did not have any chest pain during the encounter.    Objective:   Vitals:   10/24/17 1136 10/24/17 1137 10/24/17 1139 10/24/17 1142  BP: 125/78     Pulse: 67 72 86 87  Resp: 20     Temp: 98.1 F (36.7 C)     TempSrc: Oral     SpO2: 100%     Weight:      Height:        Intake/Output Summary (Last 24 hours) at 10/24/2017 1430 Last data filed at 10/24/2017 0937 Gross per 24 hour  Intake 1285 ml  Output 1650 ml  Net -365 ml     Wt Readings from Last 3 Encounters:  10/24/17 70.9 kg (156 lb 4.8 oz)  10/17/17 74 kg (163 lb 3.2 oz)  09/07/17 77.6 kg (171 lb)     Exam    General: Alert and oriented x 3, NAD  Eyes:   HEENT:  Atraumatic, normocephalic  Cardiovascular: S1 S2 auscultated, Regular rate and rhythm. No pedal edema b/l  Respiratory: Bilateral wheezing  Gastrointestinal: Soft, nontender, nondistended, + bowel sounds  Ext: no pedal edema bilaterally  Neuro: no FND  Musculoskeletal: No digital cyanosis, clubbing  Skin: No rashes  Psych: Normal affect and demeanor, alert and oriented x3     Data Reviewed:  I have personally reviewed following labs and imaging studies  Micro Results Recent Results (from the past 240 hour(s))  Respiratory Panel by PCR     Status: None   Collection Time: 10/18/17 11:12 AM  Result Value Ref Range Status   Adenovirus NOT DETECTED NOT DETECTED Final   Coronavirus 229E NOT DETECTED NOT DETECTED Final   Coronavirus HKU1 NOT DETECTED NOT DETECTED Final   Coronavirus  NL63 NOT DETECTED NOT DETECTED Final   Coronavirus OC43 NOT DETECTED NOT DETECTED Final   Metapneumovirus NOT DETECTED NOT DETECTED Final   Rhinovirus / Enterovirus NOT DETECTED NOT DETECTED Final   Influenza A NOT DETECTED NOT DETECTED Final   Influenza B NOT DETECTED NOT DETECTED Final   Parainfluenza Virus 1 NOT DETECTED NOT DETECTED Final   Parainfluenza Virus 2 NOT DETECTED NOT DETECTED Final  Parainfluenza Virus 3 NOT DETECTED NOT DETECTED Final   Parainfluenza Virus 4 NOT DETECTED NOT DETECTED Final   Respiratory Syncytial Virus NOT DETECTED NOT DETECTED Final   Bordetella pertussis NOT DETECTED NOT DETECTED Final   Chlamydophila pneumoniae NOT DETECTED NOT DETECTED Final   Mycoplasma pneumoniae NOT DETECTED NOT DETECTED Final    Comment: Performed at [facility], [address] [place], [place] [address]  Culture, sputum-assessment     Status: None   Collection Time: 10/18/17 11:23 AM  Result Value Ref Range Status   Specimen Description SPUTUM  Final   Special Requests NONE  Final   Sputum evaluation   Final    THIS SPECIMEN IS ACCEPTABLE FOR SPUTUM CULTURE Performed at [facility], [address] [place], [place] [address]    Report Status 10/18/2017 FINAL  Final  Culture, respiratory (NON-Expectorated)     Status: None   Collection Time: 10/18/17 11:23 AM  Result Value Ref Range Status   Specimen Description SPUTUM  Final   Special Requests NONE Reflexed from [id number]  Final   Gram Stain   Final    RARE WBC PRESENT, PREDOMINANTLY PMN FEW YEAST FEW GRAM POSITIVE COCCI IN CHAINS FEW GRAM POSITIVE RODS RARE GRAM NEGATIVE RODS    Culture   Final    Consistent with normal respiratory flora. Performed at [facility], [address] [place], [place] [address]    Report Status 10/20/2017 FINAL  Final    Radiology Reports Dg Chest 2 View  Result Date: 10/17/2017 CLINICAL DATA:  Cough EXAM: CHEST - 2 VIEW COMPARISON:  None. FINDINGS: Lungs are  hyperexpanded. There is no edema or consolidation. Heart size and pulmonary vascularity are normal. No adenopathy. No evident bone lesions. IMPRESSION: Lungs hyperexpanded without edema or consolidation. Heart size normal. Electronically Signed   By: Bretta BangWilliam  Woodruff III M.D.   On: 10/17/2017 12:29   Ct Chest W Contrast  Result Date: 10/22/2017 CLINICAL DATA:  Chest pain or shortness of breath. Pleurisy or effusion suspected. Patient reports hemoptysis, dyspnea and wheezing. EXAM: CT CHEST WITH CONTRAST TECHNIQUE: Multidetector CT imaging of the chest was performed during intravenous contrast administration. CONTRAST:  100mL OMNIPAQUE IOHEXOL 300 MG/ML  SOLN COMPARISON:  Radiographs yesterday and over the past week. FINDINGS: Cardiovascular: No significant vascular findings. Normal heart size. No pericardial effusion. Mediastinum/Nodes: No enlarged mediastinal or hilar lymph nodes. No mediastinal mass. The esophagus is decompressed. No thyroid nodule. Lungs/Pleura: Minimal dependent atelectasis in both lower lobes. Mild central bronchial thickening. Trachea and mainstem bronchi are otherwise patent. No confluent airspace disease. No pneumothorax or pulmonary mass. No pulmonary edema. No pleural fluid. Upper Abdomen: No acute abnormality. Musculoskeletal: There are no acute or suspicious osseous abnormalities. IMPRESSION: Minimal central bronchial thickening and dependent atelectasis in both lower lobes. No pleural effusion or other explanation for shortness of breath. Electronically Signed   By: Rubye OaksMelanie  Ehinger M.D.   On: 10/22/2017 23:10   Dg Chest Port 1 View  Result Date: 10/21/2017 CLINICAL DATA:  Shortness of breath, cough EXAM: PORTABLE CHEST 1 VIEW COMPARISON:  10/18/2017 FINDINGS: Heart and mediastinal contours are within normal limits. No focal opacities or effusions. No acute bony abnormality. IMPRESSION: No active disease. Electronically Signed   By: Charlett NoseKevin  Dover M.D.   On: 10/21/2017 07:52   Dg  Chest Port 1 View  Result Date: 10/18/2017 CLINICAL DATA:  32 year old male with asthma exacerbation EXAM: PORTABLE CHEST 1 VIEW COMPARISON:  Prior chest x-ray  10/17/2017 FINDINGS: The lungs are clear and negative for focal airspace consolidation, pulmonary edema or suspicious pulmonary nodule. The degree of pulmonary hyperinflation is similar compared to prior. No pleural effusion or pneumothorax. Cardiac and mediastinal contours are within normal limits. No acute fracture or lytic or blastic osseous lesions. The visualized upper abdominal bowel gas pattern is unremarkable. IMPRESSION: 1. No acute cardiopulmonary process. 2. Similar degree of pulmonary hyperinflation. Electronically Signed   By: Malachy Moan M.D.   On: 10/18/2017 12:34   Dg Chest Portable 1 View  Result Date: 10/17/2017 CLINICAL DATA:  Shortness of breath today. EXAM: PORTABLE CHEST 1 VIEW COMPARISON:  PA and lateral chest 10/17/2017. FINDINGS: The chest is hyperexpanded but the lungs are clear. No pneumothorax or pleural effusion. Heart size is normal. No acute bony abnormality. IMPRESSION: Pulmonary hyperexpansion.  Lungs are clear. Electronically Signed   By: Drusilla Kanner M.D.   On: 10/17/2017 15:38    Lab Data:  CBC: Recent Labs  Lab 10/17/17 1811 10/18/17 0719 10/19/17 0022 10/20/17 0048 10/21/17 0528 10/22/17 0453  WBC 7.6 8.4 17.2* 14.9* 13.2* 12.9*  NEUTROABS 5.9 6.5 15.1* 13.3* 10.9*  --   HGB 15.0 15.6 15.2 15.3 14.6 15.8  HCT 44.9 47.2 46.3 46.2 43.5 47.0  MCV 90.5 90.9 91.3 91.8 90.8 90.4  PLT 267 296 324 327 265 298   Basic Metabolic Panel: Recent Labs  Lab 10/19/17 0022 10/20/17 0048 10/21/17 0528 10/22/17 0453 10/23/17 0352 10/24/17 0648  NA 137  --  134* 135 134* 135  K 5.1  --  5.1 4.4 4.6 4.6  CL 100*  --  98* 99* 96* 96*  CO2 28  --  30 31 31 29   GLUCOSE 131*  --  121* 130* 122* 114*  BUN 11  --  17 17 21* 20  CREATININE 1.11  --  0.75 0.84 0.85 0.88  CALCIUM 9.3  --  9.3 9.2 9.7  9.5  MG 2.2 2.4  --  2.3  --   --   PHOS 3.8 3.0  --   --   --   --    GFR: Estimated Creatinine Clearance: 119.7 mL/min (by C-G formula based on SCr of 0.88 mg/dL). Liver Function Tests: Recent Labs  Lab 10/19/17 0022  AST 26  ALT 29  ALKPHOS 66  BILITOT 0.7  PROT 6.9  ALBUMIN 3.8   No results for input(s): LIPASE, AMYLASE in the last 168 hours. No results for input(s): AMMONIA in the last 168 hours. Coagulation Profile: No results for input(s): INR, PROTIME in the last 168 hours. Cardiac Enzymes: Recent Labs  Lab 10/20/17 0048  CKTOTAL 285  CKMB 7.8*   BNP (last 3 results) No results for input(s): PROBNP in the last 8760 hours. HbA1C: No results for input(s): HGBA1C in the last 72 hours. CBG: Recent Labs  Lab 10/20/17 1624 10/20/17 2059 10/22/17 0749 10/23/17 0752 10/24/17 0754  GLUCAP 122* 111* 103* 131* 110*   Lipid Profile: No results for input(s): CHOL, HDL, LDLCALC, TRIG, CHOLHDL, LDLDIRECT in the last 72 hours. Thyroid Function Tests: No results for input(s): TSH, T4TOTAL, FREET4, T3FREE, THYROIDAB in the last 72 hours. Anemia Panel: No results for input(s): VITAMINB12, FOLATE, FERRITIN, TIBC, IRON, RETICCTPCT in the last 72 hours. Urine analysis:    Component Value Date/Time   COLORURINE STRAW (A) 10/18/2017 0751   APPEARANCEUR CLEAR 10/18/2017 0751   LABSPEC 1.006 10/18/2017 0751   PHURINE 6.0 10/18/2017 0751   GLUCOSEU 50 (A) 10/18/2017 4098  HGBUR NEGATIVE 10/18/2017 0751   BILIRUBINUR NEGATIVE 10/18/2017 0751   KETONESUR NEGATIVE 10/18/2017 0751   PROTEINUR NEGATIVE 10/18/2017 0751   NITRITE NEGATIVE 10/18/2017 0751   LEUKOCYTESUR NEGATIVE 10/18/2017 0751     Shirlette Scarber M.D. [facility] 10/24/2017, 2:30 PM  Pager: [phone] Between 7am to 7pm - call Pager - [phone]  After 7pm go to [email or web address] - password [email or web address]  Call night coverage person covering after 7pm

## 2017-10-25 LAB — BASIC METABOLIC PANEL
Anion gap: 8 (ref 5–15)
BUN: 18 mg/dL (ref 6–20)
CALCIUM: 9.5 mg/dL (ref 8.9–10.3)
CO2: 32 mmol/L (ref 22–32)
Chloride: 94 mmol/L — ABNORMAL LOW (ref 101–111)
Creatinine, Ser: 0.87 mg/dL (ref 0.61–1.24)
Glucose, Bld: 113 mg/dL — ABNORMAL HIGH (ref 65–99)
Potassium: 4.7 mmol/L (ref 3.5–5.1)
Sodium: 134 mmol/L — ABNORMAL LOW (ref 135–145)

## 2017-10-25 LAB — GLUCOSE, CAPILLARY: Glucose-Capillary: 124 mg/dL — ABNORMAL HIGH (ref 65–99)

## 2017-10-25 NOTE — Progress Notes (Signed)
Name: Jeralyn BennettRayshawn M Lawler MRN: [id number] DOB: 08/18/1985    ADMISSION DATE:  10/17/2017 CONSULTATION DATE:  10/17/2017  REFERRING MD :  Dr. Denton LankSteinl   CHIEF COMPLAINT:  Dyspnea  brief [id number] year old male with PMH of  Asthma, Past smoker (reportley quiet March 2019). Recent Visit to ED on 4/12 with purulent rhinorrhea, given doxycycline and 4/22 for cough/shortness of breath given steroid taper and nebs   5/31 Presented to Pulmonary Office (Dr. Isaiah SergeMannam) with reported 3 days of dyspnea, white mucus production, and wheezing with noted hemoptysis. feno 95 . Given Severity of dyspnea and wheezing sent to ED for further work-up. Given 125 mg solu-medrol, magnesium 2 mg, albuterol 10 mg and 20 mg, in addition to Atrovent with minimal improvement. PCCM asked to consult.    SIGNIFICANT EVENTS  5/31 > Presents to ED from office as  10/24/2017 fiberoptic bronchoscopy with thrush noted on posterior pharynx  CXR 5/31 > The chest is hyperexpanded but the lungs are clear. No pneumothorax or pleural effusion. Heart size is normal. No acute bony abnormality. CXR 6/4> Heart and mediastinal contours are within normal limits. No focal opacities or effusions.   SUBJECTIVE/OVERNIGHT/INTERVAL HX Hemoptysis resolved but collapsed again in the shower today with a coughing spell  VITAL SIGNS: Temp:  [98.1 F (36.7 C)-98.2 F (36.8 C)] 98.1 F (36.7 C) (06/08 0353) Pulse Rate:  [66-135] 75 (06/08 0353) Resp:  [11-32] 20 (06/08 0353) BP: (121-165)/(72-107) 137/86 (06/08 0353) SpO2:  [93 %-100 %] 98 % (06/08 0913) Weight:  [71.2 kg (157 lb)] 71.2 kg (157 lb) (06/08 0353)  PHYSICAL EXAMINATION: General: Well-nourished well-developed male HEENT: No JVD or lymphadenopathy is appreciated Neuro: Intact follows commands CV: s1s2 rrr, no m/r/g PULM: Wheezing is decreased, reports breathing better on 10/25/2017 VH:QIONGI:soft, non-tender, bsx4 active  Extremities: warm/dry, negative edema  Skin: no rashes or  lesions  PULMONARY No results for input(s): PHART, PCO2ART, PO2ART, HCO3, TCO2, O2SAT in the last 168 hours.  Invalid input(s): PCO2, PO2 CBC Recent Labs  Lab 10/20/17 0048 10/21/17 0528 10/22/17 0453  HGB 15.3 14.6 15.8  HCT 46.2 43.5 47.0  WBC 14.9* 13.2* 12.9*  PLT 327 265 298   COAGULATION No results for input(s): INR in the last 168 hours.  CARDIAC  No results for input(s): TROPONINI in the last 168 hours. No results for input(s): PROBNP in the last 168 hours.  CHEMISTRY Recent Labs  Lab 10/19/17 0022 10/20/17 [id number] 10/21/17 0528 10/22/17 0453 10/23/17 0352 10/24/17 0648 10/25/17 0643  NA 137  --  134* 135 134* 135 134*  K 5.1  --  5.1 4.4 4.6 4.6 4.7  CL 100*  --  98* 99* 96* 96* 94*  CO2 28  --  30 31 31 29  32  GLUCOSE 131*  --  121* 130* 122* 114* 113*  BUN 11  --  17 17 21* 20 18  CREATININE 1.11  --  0.75 0.84 0.85 0.88 0.87  CALCIUM 9.3  --  9.3 9.2 9.7 9.5 9.5  MG 2.2 2.4  --  2.3  --   --   --   PHOS 3.8 3.0  --   --   --   --   --    Estimated Creatinine Clearance: 121.6 mL/min (by C-G formula based on SCr of 0.87 mg/dL).  LIVER Recent Labs  Lab 10/19/17 0022  AST 26  ALT 29  ALKPHOS 66  BILITOT 0.7  PROT 6.9  ALBUMIN 3.8   INFECTIOUS Recent  Labs  Lab 10/19/17 0022 10/20/17 0048 10/22/17 0453  LATICACIDVEN 3.7* 4.2* 2.7*   ENDOCRINE CBG (last 3)  Recent Labs    10/24/17 0754 10/24/17 1642 10/25/17 0735  GLUCAP 110* 118* 124*   I reviewed CXR myself, bilateral lower lobe opacification noted  ASSESSMENT / PLAN:  32 year old male with PMH of asthma who has had significant difficulty with asthma control since moving to [place].  Presenting to PCCM with SOB, status asthmaticus and hemoptysis.  On exam, continues to wheeze.  I reviewed CXR myself, no acute disease noted.  Discussed with PCCM-NP.  Bronch done today, showed evidence of thrush, picture on the chart, with no other abnormalities.  10/25/2017 notes improvement in  symptoms.  Status asthmaticus:  -Continue Solu-Medrol  -Nebulizer treatment discontinued  -Mucolytic  - Abx as ordered, continue for now  Cough:  -Nebulizers have been discontinued  -PRN cough suppressant  - Tessalon pearls  -Nystatin for thrush found on bronchoscopy 10/24/2017  Hypoxemia:  - Titrate O2 for sat of 88-92%  Hemoptysis: resolved  -Continue cough suppressant  -Improved 10/25/2017  Allergies:  - Anti-histamine as ordered  -We will need allergy testing as outpatient  -  follow IgE level  Sinus tach: improving when not coughing  -Continue monitoring continue antihistamine  Syncope  -Cardiology following  PCCM will follow-up Monday, 10/27/2017 or sooner if needed  [facility] Giovan Pinsky ACNP Adolph Pollack PCCM Pager [phone] till 1 pm If no answer page [phone] 10/25/2017, 10:42 AM

## 2017-10-25 NOTE — Progress Notes (Signed)
Soil scientistCalled manager of nutrition. Shannon AmendCourtney has left for the day. Spoke to [name], asked him to come to bedside to speak to pt regarding several complaints. Primary RN aware   Shannon [name] contact is [phone]

## 2017-10-25 NOTE — Progress Notes (Signed)
Pt requesting to speak to CN regarding errors on multiple food trays since admission. At this time, pt complains of not having a biscuit on his breakfast tray. Biscuit is not listed on ticket. Pt is on regular diet. NT called [phone] ordered pt biscuit and went to pick it up. Pt is still very upset at this time. Called service response spoke to the Yahoomanager Courtney. Toni AmendCourtney unable to come to bedside at this time but will later today. Pt aware and understanding. Primary RN aware

## 2017-10-25 NOTE — Progress Notes (Signed)
Pt and fiancee report seeing non-sustained tachycardia in the 170s-180s twice tonight via continuous pulse ox machine. RN came to room to assess. RN aware of previous ST episodes in the 140s in the past few days. No ST seen on cardiac monitor tonight by RN, CCMD, or telemetry strips. RN informed pt and fiancee that continuous pulse ox can be inaccurate at times due to poor mechanical connection to finger probe. RN demonstrated to pt and fiancee how they can check pt's HR accurately on telemetry box connected to pt. Continuous pulse ox equipment changed. No further tachycardia reported by pt/fiancee on equipment. Will continue to monitor independently and with CCMD.

## 2017-10-25 NOTE — Progress Notes (Addendum)
Telemetry showing ST in the 190s-220s with artifact. CCMD called this RN as I was walking into pt's room. Pt asleep on his left side, scratching his abdomen near his telemetry leads. Pt asymptomatic. Telemetry strips placed in chart. Will continue to monitor.

## 2017-10-25 NOTE — Progress Notes (Signed)
[facility]                                                                              Patient Demographics  Shannon Knapp, is a 32 y.o. male, DOB - 01/04/1986, XJO:[id number]  Admit date - 10/17/2017   Admitting Physician Lady Deutscher, MD  Outpatient Primary MD for the patient is Patient, No Pcp Per  Outpatient specialists:   LOS - 8  days   Medical records reviewed and are as summarized below:    Chief Complaint  Patient presents with  . COPD       Brief summary   Patient is a 32 year old male with history of asthma, past smoker, quit March 2019, presented to pulmonary office for follow-up with Dr. Vaughan Browner, had reported 3 days of dyspnea, wheezing with coughing, mucus phlegm and hemoptysis.  Patient was sent to ED for further work-up.  CCM has been following.  Assessment & Plan    Principal Problem:   Severe persistent acute asthmatic bronchitis - patient also reports family history of asthma with father, uncle and now his son, did have asthma as a child -Work-up so far negative, L5 antitrypsin negative, HIV negative, eosinophils within normal limits, chest x-ray shows no infiltrates, sputum culture showed normal flora.  Respiratory virus panel negative -currently on as needed Xopenex, Dulera, Mucinex -continue tessalon Perles, Hycodan -2D echo showed EF 65 to 70% with normal diastolic function, no regional wall motion abnormality. -CT chest showed minimal central bronchial thickening and dependent atelectasis in the both lower lobe -Pulmonology following, underwent bronchoscopy which showed evidence of thrush otherwise no abnormalities. -Feeling slightly better today.  Active Problems:  Oral thrush Started on fluconazole x 7 days   Syncope/near syncope - Patient had reported 2 syncopal events, cardiology was consulted, recommended outpatient event monitor, do not think he needs implantable loop recorder -Orthostatic vitals were negative.        Hemoptysis -Likely due to acute tracheobronchitis from #1, continue Hycodan, Tessalon Perles -Continue SCDs   Sinus tachycardia -Improving, continue Xopenex as needed  Code Status: Full CODE STATUS DVT Prophylaxis: SCD's Family Communication: Discussed in detail with the patient, all imaging results, lab results explained to the patient and fiance   Disposition Plan: When cleared by pulmonology  Time Spent in minutes 25 minutes  Procedures:  2D echo EF 65 to 70%, normal motion abnormalities  Consultants:   Pulmonary Cardiology  Antimicrobials:      Medications  Scheduled Meds: . benzonatate  200 mg Oral TID  . dextromethorphan-guaiFENesin  1 tablet Oral BID  . diphenhydrAMINE  25 mg Oral QHS  . fluconazole  100 mg Oral Daily  . guaiFENesin  600 mg Oral BID  . methylPREDNISolone (SOLU-MEDROL) injection  40 mg Intravenous Q6H  . mometasone-formoterol  2 puff Inhalation BID  . multivitamin with minerals  1 tablet Oral Daily  . nystatin  5 mL Mouth/Throat QID  . pantoprazole  40 mg Oral Daily  . protein supplement shake  11 oz Oral TID BM  . sodium chloride flush  3 mL Intravenous Q12H  . traMADol  100 mg Oral Q6H  Continuous Infusions: . sodium chloride    . sodium chloride 10 mL/hr at 10/24/17 1447   PRN Meds:.sodium chloride, acetaminophen **OR** acetaminophen, hydrALAZINE, HYDROcodone-homatropine, levalbuterol, ondansetron **OR** ondansetron (ZOFRAN) IV, sodium chloride flush, traMADol   Antibiotics   Anti-infectives (From admission, onward)   Start     Dose/Rate Route Frequency Ordered Stop   10/24/17 1800  fluconazole (DIFLUCAN) tablet 100 mg     100 mg Oral Daily 10/24/17 1722 10/29/17 0959   10/18/17 1800  levofloxacin (LEVAQUIN) tablet 750 mg  Status:  Discontinued     750 mg Oral Daily 10/18/17 1033 10/18/17 1039   10/18/17 1800  levofloxacin (LEVAQUIN) tablet 500 mg     500 mg Oral Daily 10/18/17 1039 10/21/17 1646   10/17/17 1800   levofloxacin (LEVAQUIN) IVPB 750 mg  Status:  Discontinued     750 mg 100 mL/hr over 90 Minutes Intravenous Every 24 hours 10/17/17 1620 10/18/17 1033        Subjective:   Shannon Knapp was seen and examined today.  Feeling slightly better today.  No fevers or chills.  No chest pain.  Objective:   Vitals:   10/24/17 1944 10/24/17 2045 10/25/17 0353 10/25/17 0913  BP: 131/73  137/86   Pulse: 73  75   Resp: 18  20   Temp:   98.1 F (36.7 C)   TempSrc:   Oral   SpO2: 98% 98% 99% 98%  Weight:   71.2 kg (157 lb)   Height:        Intake/Output Summary (Last 24 hours) at 10/25/2017 1139 Last data filed at 10/25/2017 6004 Gross per 24 hour  Intake 1265 ml  Output 700 ml  Net 565 ml     Wt Readings from Last 3 Encounters:  10/25/17 71.2 kg (157 lb)  10/17/17 74 kg (163 lb 3.2 oz)  09/07/17 77.6 kg (171 lb)     Exam  General: Alert and oriented x 3, NAD Eyes:  HEENT:  Cardiovascular: S1 S2 auscultated, RRR no pedal edema b/l Respiratory: Wheezing better today Gastrointestinal: Soft, nontender, nondistended, + bowel sounds Ext: no pedal edema bilaterally Neuro: no new deficits  Musculoskeletal: No digital cyanosis, clubbing Skin: No rashes Psych: Normal affect and demeanor, alert and oriented x3       Data Reviewed:  I have personally reviewed following labs and imaging studies  Micro Results Recent Results (from the past 240 hour(s))  Respiratory Panel by PCR     Status: None   Collection Time: 10/18/17 11:12 AM  Result Value Ref Range Status   Adenovirus NOT DETECTED NOT DETECTED Final   Coronavirus 229E NOT DETECTED NOT DETECTED Final   Coronavirus HKU1 NOT DETECTED NOT DETECTED Final   Coronavirus NL63 NOT DETECTED NOT DETECTED Final   Coronavirus OC43 NOT DETECTED NOT DETECTED Final   Metapneumovirus NOT DETECTED NOT DETECTED Final   Rhinovirus / Enterovirus NOT DETECTED NOT DETECTED Final   Influenza A NOT DETECTED NOT DETECTED Final   Influenza  B NOT DETECTED NOT DETECTED Final   Parainfluenza Virus 1 NOT DETECTED NOT DETECTED Final   Parainfluenza Virus 2 NOT DETECTED NOT DETECTED Final   Parainfluenza Virus 3 NOT DETECTED NOT DETECTED Final   Parainfluenza Virus 4 NOT DETECTED NOT DETECTED Final   Respiratory Syncytial Virus NOT DETECTED NOT DETECTED Final   Bordetella pertussis NOT DETECTED NOT DETECTED Final   Chlamydophila pneumoniae NOT DETECTED NOT DETECTED Final   Mycoplasma pneumoniae NOT DETECTED NOT DETECTED Final  Comment: Performed at [facility], [place] [address], [place], [place] [address]  Culture, sputum-assessment     Status: None   Collection Time: 10/18/17 11:23 AM  Result Value Ref Range Status   Specimen Description SPUTUM  Final   Special Requests NONE  Final   Sputum evaluation   Final    THIS SPECIMEN IS ACCEPTABLE FOR SPUTUM CULTURE Performed at [facility], [address] [place], [place] [address]    Report Status 10/18/2017 FINAL  Final  Culture, respiratory (NON-Expectorated)     Status: None   Collection Time: 10/18/17 11:23 AM  Result Value Ref Range Status   Specimen Description SPUTUM  Final   Special Requests NONE Reflexed from [id number]  Final   Gram Stain   Final    RARE WBC PRESENT, PREDOMINANTLY PMN FEW YEAST FEW GRAM POSITIVE COCCI IN CHAINS FEW GRAM POSITIVE RODS RARE GRAM NEGATIVE RODS    Culture   Final    Consistent with normal respiratory flora. Performed at [facility], [place] [address], [place], [place] [address]    Report Status 10/20/2017 FINAL  Final  Culture, bal-quantitative     Status: None (Preliminary result)   Collection Time: 10/24/17  3:55 PM  Result Value Ref Range Status   Specimen Description BRONCHIAL ALVEOLAR LAVAGE  Final   Special Requests NONE  Final   Gram Stain   Final    RARE WBC PRESENT, PREDOMINANTLY MONONUCLEAR NO ORGANISMS SEEN    Culture   Final    CULTURE REINCUBATED FOR BETTER GROWTH Performed at [place], [place] [address] [place], [place] [address]    Report Status PENDING  Incomplete    Radiology Reports Dg Chest 2 View  Result Date: 10/17/2017 CLINICAL DATA:  Cough EXAM: CHEST - 2 VIEW COMPARISON:  None. FINDINGS: Lungs are hyperexpanded. There is no edema or consolidation. Heart size and pulmonary vascularity are normal. No adenopathy. No evident bone lesions. IMPRESSION: Lungs hyperexpanded without edema or consolidation. Heart size normal. Electronically Signed   By: Lowella Grip III M.D.   On: 10/17/2017 12:29   Ct Chest W Contrast  Result Date: 10/22/2017 CLINICAL DATA:  Chest pain or shortness of breath. Pleurisy or effusion suspected. Patient reports hemoptysis, dyspnea and wheezing. EXAM: CT CHEST WITH CONTRAST TECHNIQUE: Multidetector CT imaging of the chest was performed during intravenous contrast administration. CONTRAST:  167m OMNIPAQUE IOHEXOL 300 MG/ML  SOLN COMPARISON:  Radiographs yesterday and over the past week. FINDINGS: Cardiovascular: No significant vascular findings. Normal heart size. No pericardial effusion. Mediastinum/Nodes: No enlarged mediastinal or hilar lymph nodes. No mediastinal mass. The esophagus is decompressed. No thyroid nodule. Lungs/Pleura: Minimal dependent atelectasis in both lower lobes. Mild central bronchial thickening. Trachea and mainstem bronchi are otherwise patent. No confluent airspace disease. No pneumothorax or pulmonary mass. No pulmonary edema. No pleural fluid. Upper Abdomen: No acute abnormality. Musculoskeletal: There are no acute or suspicious osseous abnormalities. IMPRESSION: Minimal central bronchial thickening and dependent atelectasis in both lower lobes. No pleural effusion or other explanation for shortness of breath. Electronically Signed   By: MJeb LeveringM.D.   On: 10/22/2017 23:10   Dg Chest Port 1 View  Result Date: 10/21/2017 CLINICAL DATA:  Shortness of breath, cough EXAM: PORTABLE CHEST 1 VIEW COMPARISON:  10/18/2017  FINDINGS: Heart and mediastinal contours are within normal limits. No focal opacities or effusions. No acute bony abnormality. IMPRESSION: No active disease. Electronically Signed   By: KLennette Bihari  [place] M.D.   On: 10/21/2017 07:52   Dg Chest Port 1 View  Result Date: 10/18/2017 CLINICAL DATA:  32 year old male with asthma exacerbation EXAM: PORTABLE CHEST 1 VIEW COMPARISON:  Prior chest x-ray 10/17/2017 FINDINGS: The lungs are clear and negative for focal airspace consolidation, pulmonary edema or suspicious pulmonary nodule. The degree of pulmonary hyperinflation is similar compared to prior. No pleural effusion or pneumothorax. Cardiac and mediastinal contours are within normal limits. No acute fracture or lytic or blastic osseous lesions. The visualized upper abdominal bowel gas pattern is unremarkable. IMPRESSION: 1. No acute cardiopulmonary process. 2. Similar degree of pulmonary hyperinflation. Electronically Signed   By: Jacqulynn Cadet M.D.   On: 10/18/2017 12:34   Dg Chest Portable 1 View  Result Date: 10/17/2017 CLINICAL DATA:  Shortness of breath today. EXAM: PORTABLE CHEST 1 VIEW COMPARISON:  PA and lateral chest 10/17/2017. FINDINGS: The chest is hyperexpanded but the lungs are clear. No pneumothorax or pleural effusion. Heart size is normal. No acute bony abnormality. IMPRESSION: Pulmonary hyperexpansion.  Lungs are clear. Electronically Signed   By: Inge Rise M.D.   On: 10/17/2017 15:38    Lab Data:  CBC: Recent Labs  Lab 10/19/17 0022 10/20/17 0048 10/21/17 0528 10/22/17 0453  WBC 17.2* 14.9* 13.2* 12.9*  NEUTROABS 15.1* 13.3* 10.9*  --   HGB 15.2 15.3 14.6 15.8  HCT 46.3 46.2 43.5 47.0  MCV 91.3 91.8 90.8 90.4  PLT 324 327 265 546   Basic Metabolic Panel: Recent Labs  Lab 10/19/17 0022 10/20/17 0048 10/21/17 0528 10/22/17 0453 10/23/17 0352 10/24/17 0648 10/25/17 0643  NA 137  --  134* 135 134* 135 134*  K 5.1  --  5.1 4.4 4.6 4.6 4.7  CL 100*  --  98* 99*  96* 96* 94*  CO2 28  --  _0 32  GLUCOSE 131*  --  121* 130* 122* 114* 113*  BUN 11  --  17 17 21* 20 18  CREATININE 1.11  --  0.75 0.84 0.85 0.88 0.87  CALCIUM 9.3  --  9.3 9.2 9.7 9.5 9.5  MG 2.2 2.4  --  2.3  --   --   --   PHOS 3.8 3.0  --   --   --   --   --    GFR: Estimated Creatinine Clearance: 121.6 mL/min (by C-G formula based on SCr of 0.87 mg/dL). Liver Function Tests: Recent Labs  Lab 10/19/17 0022  AST 26  ALT 29  ALKPHOS 66  BILITOT 0.7  PROT 6.9  ALBUMIN 3.8   No results for input(s): LIPASE, AMYLASE in the last 168 hours. No results for input(s): AMMONIA in the last 168 hours. Coagulation Profile: No results for input(s): INR, PROTIME in the last 168 hours. Cardiac Enzymes: Recent Labs  Lab 10/20/17 0048  CKTOTAL 285  CKMB 7.8*   BNP (last 3 results) No results for input(s): PROBNP in the last 8760 hours. HbA1C: No results for input(s): HGBA1C in the last 72 hours. CBG: Recent Labs  Lab 10/22/17 0749 10/23/17 0752 10/24/17 0754 10/24/17 1642 10/25/17 0735  GLUCAP 103* 131* 110* 118* 124*   Lipid Profile: No results for input(s): CHOL, HDL, LDLCALC, TRIG, CHOLHDL, LDLDIRECT in the last 72 hours. Thyroid Function Tests: No results for input(s): TSH, T4TOTAL, FREET4, T3FREE, THYROIDAB in the last 72 hours. Anemia Panel: No results for input(s): VITAMINB12, FOLATE, FERRITIN, TIBC, IRON, RETICCTPCT in the last 72 hours. Urine analysis:  Component Value Date/Time   COLORURINE STRAW (A) 10/18/2017 0751   APPEARANCEUR CLEAR 10/18/2017 0751   LABSPEC 1.006 10/18/2017 0751   PHURINE 6.0 10/18/2017 0751   GLUCOSEU 50 (A) 10/18/2017 0751   HGBUR NEGATIVE 10/18/2017 0751   BILIRUBINUR NEGATIVE 10/18/2017 0751   KETONESUR NEGATIVE 10/18/2017 0751   PROTEINUR NEGATIVE 10/18/2017 0751   NITRITE NEGATIVE 10/18/2017 0751   LEUKOCYTESUR NEGATIVE 10/18/2017 0751     Yaviel Kloster M.D. [facility] 10/25/2017, 11:39 AM  Pager:  [phone] Between 7am to 7pm - call Pager - [phone]  After 7pm go to [email or web address] - password [email or web address]  Call night coverage person covering after 7pm

## 2017-10-26 ENCOUNTER — Encounter (HOSPITAL_COMMUNITY): Payer: Self-pay | Admitting: Pulmonary Disease

## 2017-10-26 LAB — GLUCOSE, CAPILLARY: GLUCOSE-CAPILLARY: 120 mg/dL — AB (ref 65–99)

## 2017-10-26 MED ORDER — DIPHENHYDRAMINE HCL 50 MG/ML IJ SOLN
25.0000 mg | INTRAMUSCULAR | Status: AC
  Administered 2017-10-26: 25 mg via INTRAVENOUS
  Filled 2017-10-26: qty 1

## 2017-10-26 MED ORDER — METHYLPREDNISOLONE SODIUM SUCC 40 MG IJ SOLR
40.0000 mg | Freq: Two times a day (BID) | INTRAMUSCULAR | Status: DC
  Administered 2017-10-26 – 2017-10-28 (×4): 40 mg via INTRAVENOUS
  Filled 2017-10-26 (×4): qty 1

## 2017-10-26 NOTE — Progress Notes (Signed)
Significant other called nurse stating he was itching and started yesterday, raised whelps on thighs esp back of thighs, encouraged air to area, no other rash noted, paged Dr Isidoro Donningai and to give benedryl 25mg  iv x1

## 2017-10-26 NOTE — Progress Notes (Signed)
[facility]                                                                              Shannon Knapp Demographics  Shannon Knapp, is a 32 y.o. male, DOB - [birth year], EXH:[id number]  Admit date - 10/17/2017   Admitting Physician Lady Deutscher, MD  Outpatient Primary MD for the Shannon Knapp is Shannon Knapp, No Pcp Per  Outpatient specialists:   LOS - 9  days   Medical records reviewed and are as summarized below:    Chief Complaint  Shannon Knapp presents with  . COPD       Brief summary   Shannon Knapp is a 32 year old male with history of asthma, past smoker, quit March 2019, presented to pulmonary office for follow-up with Dr. Vaughan Browner, had reported 3 days of dyspnea, wheezing with coughing, mucus phlegm and hemoptysis.  Shannon Knapp was sent to ED for further work-up.  CCM has been following.  Assessment & Plan    Principal Problem:   Severe persistent acute asthmatic bronchitis - Shannon Knapp also reports family history of asthma with father, uncle and now his son, did have asthma as a child -Work-up so far negative, L5 antitrypsin negative, HIV negative, eosinophils within normal limits, chest x-ray shows no infiltrates, sputum culture showed normal flora.  Respiratory virus panel negative -currently on as needed Xopenex, Dulera, Mucinex -continue tessalon Perles, Hycodan -2D echo showed EF 65 to 70% with normal diastolic function, no regional wall motion abnormality. -CT chest showed minimal central bronchial thickening and dependent atelectasis in the both lower lobe -underwent bronchoscopy which showed evidence of thrush otherwise no abnormalities. -Overall frustrated and wants the diagnosis, feels uncomfortable leaving the hospital and may have to come back.  Pulmonology following will follow recommendations, PFTs inpatient versus outpatient, will wean Solu-Medrol  Active Problems:  Oral thrush  Started on fluconazole x 7 days   Syncope/near syncope - Shannon Knapp had reported 2  syncopal events, cardiology was consulted, recommended outpatient event monitor, do not think he needs implantable loop recorder    Hemoptysis -Likely due to acute tracheobronchitis from #1, continue Hycodan, Tessalon Perles -Continue SCDs   Sinus tachycardia -Improving, continue Xopenex as needed  Code Status: Full CODE STATUS DVT Prophylaxis: SCD's Family Communication: Discussed in detail with the Shannon Knapp, all imaging results, lab results explained to the Shannon Knapp and fiance   Disposition Plan: When cleared by pulmonology  Time Spent in minutes 25 minutes  Procedures:  2D echo EF 65 to 70%, normal motion abnormalities  Consultants:   Pulmonary Cardiology  Antimicrobials:      Medications  Scheduled Meds: . benzonatate  200 mg Oral TID  . dextromethorphan-guaiFENesin  1 tablet Oral BID  . diphenhydrAMINE  25 mg Oral QHS  . fluconazole  100 mg Oral Daily  . guaiFENesin  600 mg Oral BID  . methylPREDNISolone (SOLU-MEDROL) injection  40 mg Intravenous Q6H  . mometasone-formoterol  2 puff Inhalation BID  . multivitamin with minerals  1 tablet Oral Daily  . nystatin  5 mL Mouth/Throat QID  . pantoprazole  40 mg Oral Daily  . protein supplement shake  11 oz Oral TID BM  .  sodium chloride flush  3 mL Intravenous Q12H  . traMADol  100 mg Oral Q6H   Continuous Infusions: . sodium chloride    . sodium chloride 10 mL/hr at 10/24/17 1447   PRN Meds:.sodium chloride, acetaminophen **OR** acetaminophen, hydrALAZINE, HYDROcodone-homatropine, levalbuterol, ondansetron **OR** ondansetron (ZOFRAN) IV, sodium chloride flush, traMADol   Antibiotics   Anti-infectives (From admission, onward)   Start     Dose/Rate Route Frequency Ordered Stop   10/24/17 1800  fluconazole (DIFLUCAN) tablet 100 mg     100 mg Oral Daily 10/24/17 1722 10/29/17 0959   10/18/17 1800  levofloxacin (LEVAQUIN) tablet 750 mg  Status:  Discontinued     750 mg Oral Daily 10/18/17 1033 10/18/17 1039    10/18/17 1800  levofloxacin (LEVAQUIN) tablet 500 mg     500 mg Oral Daily 10/18/17 1039 10/21/17 1646   10/17/17 1800  levofloxacin (LEVAQUIN) IVPB 750 mg  Status:  Discontinued     750 mg 100 mL/hr over 90 Minutes Intravenous Every 24 hours 10/17/17 1620 10/18/17 1033        Subjective:   Roswell Nickel was seen and examined today.  Wheezing and shortness of breath slightly better but overall feeling frustrated no fevers or chills.  No chest pain Objective:   Vitals:   10/25/17 1933 10/25/17 2108 10/26/17 0635 10/26/17 0656  BP:  134/83 (!) 133/98   Pulse:  87 81   Resp:  18 18   Temp:  98.5 F (36.9 C) 98.4 F (36.9 C)   TempSrc:  Oral Oral   SpO2: 97% 98% 95% 96%  Weight:   71.5 kg (157 lb 9.6 oz)   Height:        Intake/Output Summary (Last 24 hours) at 10/26/2017 1052 Last data filed at 10/26/2017 1022 Gross per 24 hour  Intake 843 ml  Output 2850 ml  Net -2007 ml     Wt Readings from Last 3 Encounters:  10/26/17 71.5 kg (157 lb 9.6 oz)  10/17/17 74 kg (163 lb 3.2 oz)  09/07/17 77.6 kg (171 lb)     Exam  General: Alert and oriented x 3, NAD Eyes:  HEENT:   Cardiovascular: S1 S2 auscultated,  Regular rate and rhythm. No pedal edema b/l Respiratory: Bilateral wheezing Gastrointestinal: Soft, nontender, nondistended, + bowel sounds Ext: no pedal edema bilaterally Neuro: no new deficits Musculoskeletal: No digital cyanosis, clubbing Skin: No rashes Psych: Normal affect and demeanor, alert and oriented x3       Data Reviewed:  I have personally reviewed following labs and imaging studies  Micro Results Recent Results (from the past 240 hour(s))  Respiratory Panel by PCR     Status: None   Collection Time: 10/18/17 11:12 AM  Result Value Ref Range Status   Adenovirus NOT DETECTED NOT DETECTED Final   Coronavirus 229E NOT DETECTED NOT DETECTED Final   Coronavirus HKU1 NOT DETECTED NOT DETECTED Final   Coronavirus NL63 NOT DETECTED NOT DETECTED  Final   Coronavirus OC43 NOT DETECTED NOT DETECTED Final   Metapneumovirus NOT DETECTED NOT DETECTED Final   Rhinovirus / Enterovirus NOT DETECTED NOT DETECTED Final   Influenza A NOT DETECTED NOT DETECTED Final   Influenza B NOT DETECTED NOT DETECTED Final   Parainfluenza Virus 1 NOT DETECTED NOT DETECTED Final   Parainfluenza Virus 2 NOT DETECTED NOT DETECTED Final   Parainfluenza Virus 3 NOT DETECTED NOT DETECTED Final   Parainfluenza Virus 4 NOT DETECTED NOT DETECTED Final   Respiratory Syncytial Virus  NOT DETECTED NOT DETECTED Final   Bordetella pertussis NOT DETECTED NOT DETECTED Final   Chlamydophila pneumoniae NOT DETECTED NOT DETECTED Final   Mycoplasma pneumoniae NOT DETECTED NOT DETECTED Final    Comment: Performed at [facility], [place] [address] [place], [place] [address]  Culture, sputum-assessment     Status: None   Collection Time: 10/18/17 11:23 AM  Result Value Ref Range Status   Specimen Description SPUTUM  Final   Special Requests NONE  Final   Sputum evaluation   Final    THIS SPECIMEN IS ACCEPTABLE FOR SPUTUM CULTURE Performed at [facility], [address] [place], [place] [address]    Report Status 10/18/2017 FINAL  Final  Culture, respiratory (NON-Expectorated)     Status: None   Collection Time: 10/18/17 11:23 AM  Result Value Ref Range Status   Specimen Description SPUTUM  Final   Special Requests NONE Reflexed from [id number]  Final   Gram Stain   Final    RARE WBC PRESENT, PREDOMINANTLY PMN FEW YEAST FEW GRAM POSITIVE COCCI IN CHAINS FEW GRAM POSITIVE RODS RARE GRAM NEGATIVE RODS    Culture   Final    Consistent with normal respiratory flora. Performed at [facility], [place] [address] [place], [place] [address]    Report Status 10/20/2017 FINAL  Final  Culture, bal-quantitative     Status: None (Preliminary result)   Collection Time: 10/24/17  3:55 PM  Result Value Ref Range Status   Specimen Description BRONCHIAL ALVEOLAR  LAVAGE  Final   Special Requests NONE  Final   Gram Stain   Final    RARE WBC PRESENT, PREDOMINANTLY MONONUCLEAR NO ORGANISMS SEEN    Culture   Final    CULTURE REINCUBATED FOR BETTER GROWTH Performed at [facility], [place] [address], [place], [place] [address]    Report Status PENDING  Incomplete    Radiology Reports Dg Chest 2 View  Result Date: 10/17/2017 CLINICAL DATA:  Cough EXAM: CHEST - 2 VIEW COMPARISON:  None. FINDINGS: Lungs are hyperexpanded. There is no edema or consolidation. Heart size and pulmonary vascularity are normal. No adenopathy. No evident bone lesions. IMPRESSION: Lungs hyperexpanded without edema or consolidation. Heart size normal. Electronically Signed   By: Lowella Grip III M.D.   On: 10/17/2017 12:29   Ct Chest W Contrast  Result Date: 10/22/2017 CLINICAL DATA:  Chest pain or shortness of breath. Pleurisy or effusion suspected. Shannon Knapp reports hemoptysis, dyspnea and wheezing. EXAM: CT CHEST WITH CONTRAST TECHNIQUE: Multidetector CT imaging of the chest was performed during intravenous contrast administration. CONTRAST:  138m OMNIPAQUE IOHEXOL 300 MG/ML  SOLN COMPARISON:  Radiographs yesterday and over the past week. FINDINGS: Cardiovascular: No significant vascular findings. Normal heart size. No pericardial effusion. Mediastinum/Nodes: No enlarged mediastinal or hilar lymph nodes. No mediastinal mass. The esophagus is decompressed. No thyroid nodule. Lungs/Pleura: Minimal dependent atelectasis in both lower lobes. Mild central bronchial thickening. Trachea and mainstem bronchi are otherwise patent. No confluent airspace disease. No pneumothorax or pulmonary mass. No pulmonary edema. No pleural fluid. Upper Abdomen: No acute abnormality. Musculoskeletal: There are no acute or suspicious osseous abnormalities. IMPRESSION: Minimal central bronchial thickening and dependent atelectasis in both lower lobes. No pleural effusion or other explanation for shortness  of breath. Electronically Signed   By: MJeb LeveringM.D.   On: 10/22/2017 23:10   Dg Chest Port 1 View  Result Date: 10/21/2017 CLINICAL DATA:  Shortness of breath, cough EXAM:  PORTABLE CHEST 1 VIEW COMPARISON:  10/18/2017 FINDINGS: Heart and mediastinal contours are within normal limits. No focal opacities or effusions. No acute bony abnormality. IMPRESSION: No active disease. Electronically Signed   By: Rolm Baptise M.D.   On: 10/21/2017 07:52   Dg Chest Port 1 View  Result Date: 10/18/2017 CLINICAL DATA:  32 year old male with asthma exacerbation EXAM: PORTABLE CHEST 1 VIEW COMPARISON:  Prior chest x-ray 10/17/2017 FINDINGS: The lungs are clear and negative for focal airspace consolidation, pulmonary edema or suspicious pulmonary nodule. The degree of pulmonary hyperinflation is similar compared to prior. No pleural effusion or pneumothorax. Cardiac and mediastinal contours are within normal limits. No acute fracture or lytic or blastic osseous lesions. The visualized upper abdominal bowel gas pattern is unremarkable. IMPRESSION: 1. No acute cardiopulmonary process. 2. Similar degree of pulmonary hyperinflation. Electronically Signed   By: Jacqulynn Cadet M.D.   On: 10/18/2017 12:34   Dg Chest Portable 1 View  Result Date: 10/17/2017 CLINICAL DATA:  Shortness of breath today. EXAM: PORTABLE CHEST 1 VIEW COMPARISON:  PA and lateral chest 10/17/2017. FINDINGS: The chest is hyperexpanded but the lungs are clear. No pneumothorax or pleural effusion. Heart size is normal. No acute bony abnormality. IMPRESSION: Pulmonary hyperexpansion.  Lungs are clear. Electronically Signed   By: Inge Rise M.D.   On: 10/17/2017 15:38    Lab Data:  CBC: Recent Labs  Lab 10/20/17 0048 10/21/17 0528 10/22/17 0453  WBC 14.9* 13.2* 12.9*  NEUTROABS 13.3* 10.9*  --   HGB 15.3 14.6 15.8  HCT 46.2 43.5 47.0  MCV 91.8 90.8 90.4  PLT 327 265 812   Basic Metabolic Panel: Recent Labs  Lab 10/20/17 0048  10/21/17 0528 10/22/17 0453 10/23/17 0352 10/24/17 0648 10/25/17 0643  NA  --  134* 135 134* 135 134*  K  --  5.1 4.4 4.6 4.6 4.7  CL  --  98* 99* 96* 96* 94*  CO2  --  _0 32  GLUCOSE  --  121* 130* 122* 114* 113*  BUN  --  17 17 21* 20 18  CREATININE  --  0.75 0.84 0.85 0.88 0.87  CALCIUM  --  9.3 9.2 9.7 9.5 9.5  MG 2.4  --  2.3  --   --   --   PHOS 3.0  --   --   --   --   --    GFR: Estimated Creatinine Clearance: 122.1 mL/min (by C-G formula based on SCr of 0.87 mg/dL). Liver Function Tests: No results for input(s): AST, ALT, ALKPHOS, BILITOT, PROT, ALBUMIN in the last 168 hours. No results for input(s): LIPASE, AMYLASE in the last 168 hours. No results for input(s): AMMONIA in the last 168 hours. Coagulation Profile: No results for input(s): INR, PROTIME in the last 168 hours. Cardiac Enzymes: Recent Labs  Lab 10/20/17 0048  CKTOTAL 285  CKMB 7.8*   BNP (last 3 results) No results for input(s): PROBNP in the last 8760 hours. HbA1C: No results for input(s): HGBA1C in the last 72 hours. CBG: Recent Labs  Lab 10/23/17 0752 10/24/17 0754 10/24/17 1642 10/25/17 0735 10/26/17 0811  GLUCAP 131* 110* 118* 124* 120*   Lipid Profile: No results for input(s): CHOL, HDL, LDLCALC, TRIG, CHOLHDL, LDLDIRECT in the last 72 hours. Thyroid Function Tests: No results for input(s): TSH, T4TOTAL, FREET4, T3FREE, THYROIDAB in the last 72 hours. Anemia Panel: No results for input(s): VITAMINB12, FOLATE, FERRITIN, TIBC, IRON, RETICCTPCT in the last 72  hours. Urine analysis:    Component Value Date/Time   COLORURINE STRAW (A) 10/18/2017 0751   APPEARANCEUR CLEAR 10/18/2017 0751   LABSPEC 1.006 10/18/2017 0751   PHURINE 6.0 10/18/2017 0751   GLUCOSEU 50 (A) 10/18/2017 0751   HGBUR NEGATIVE 10/18/2017 0751   BILIRUBINUR NEGATIVE 10/18/2017 0751   KETONESUR NEGATIVE 10/18/2017 0751   PROTEINUR NEGATIVE 10/18/2017 0751   NITRITE NEGATIVE 10/18/2017 0751    LEUKOCYTESUR NEGATIVE 10/18/2017 0751     Ripudeep Rai M.D. [facility] 10/26/2017, 10:52 AM  Pager: [phone] Between 7am to 7pm - call Pager - [phone]  After 7pm go to [email or web address] - password [email or web address]  Call night coverage person covering after 7pm

## 2017-10-27 ENCOUNTER — Inpatient Hospital Stay (HOSPITAL_COMMUNITY): Payer: Self-pay

## 2017-10-27 DIAGNOSIS — R05 Cough: Secondary | ICD-10-CM

## 2017-10-27 DIAGNOSIS — R058 Other specified cough: Secondary | ICD-10-CM

## 2017-10-27 LAB — CULTURE, BAL-QUANTITATIVE W GRAM STAIN

## 2017-10-27 LAB — GLUCOSE, CAPILLARY: Glucose-Capillary: 106 mg/dL — ABNORMAL HIGH (ref 65–99)

## 2017-10-27 LAB — CULTURE, BAL-QUANTITATIVE: CULTURE: NORMAL

## 2017-10-27 LAB — PNEUMOCYSTIS JIROVECI SMEAR BY DFA: PNEUMOCYSTIS JIROVECI AG: NEGATIVE

## 2017-10-27 MED ORDER — DIPHENHYDRAMINE HCL 25 MG PO CAPS
25.0000 mg | ORAL_CAPSULE | ORAL | Status: DC | PRN
  Administered 2017-10-27 – 2017-10-30 (×12): 25 mg via ORAL
  Filled 2017-10-27 (×12): qty 1

## 2017-10-27 MED ORDER — HYDROCORTISONE ACETATE 25 MG RE SUPP
25.0000 mg | Freq: Two times a day (BID) | RECTAL | Status: DC
  Filled 2017-10-27 (×7): qty 1

## 2017-10-27 MED ORDER — IOPAMIDOL (ISOVUE-300) INJECTION 61%
15.0000 mL | INTRAVENOUS | Status: AC
  Administered 2017-10-27 (×2): 15 mL via ORAL

## 2017-10-27 MED ORDER — IOHEXOL 300 MG/ML  SOLN
100.0000 mL | Freq: Once | INTRAMUSCULAR | Status: AC | PRN
  Administered 2017-10-27: 100 mL via INTRAVENOUS

## 2017-10-27 NOTE — Progress Notes (Addendum)
Name: Shannon Knapp MRN: [id number] DOB: 03/19/1986    ADMISSION DATE:  10/17/2017 CONSULTATION DATE:  10/17/2017  REFERRING MD :  Dr. Ashok Cordia   CHIEF COMPLAINT:  Dyspnea  brief 32 year old male with PMH of  Asthma, Past smoker (reportley quiet March 2019). Recent Visit to ED on 4/12 with purulent rhinorrhea, given doxycycline and 4/22 for cough/shortness of breath given steroid taper and nebs   5/31 Presented to Pulmonary Office (Dr. Vaughan Browner) with reported 3 days of dyspnea, white mucus production, and wheezing with noted hemoptysis. feno 95 . Given Severity of dyspnea and wheezing sent to ED for further work-up. Given 125 mg solu-medrol, magnesium 2 mg, albuterol 10 mg and 20 mg, in addition to Atrovent with minimal improvement. PCCM asked to consult.    SIGNIFICANT EVENTS  5/31 > Presents to ED from office as  10/24/2017 fiberoptic bronchoscopy with thrush noted on posterior pharynx  CXR 5/31 > The chest is hyperexpanded but the lungs are clear. No pneumothorax or pleural effusion. Heart size is normal. No acute bony abnormality. CXR 6/4> Heart and mediastinal contours are within normal limits. No focal opacities or effusions.   SUBJECTIVE/OVERNIGHT/INTERVAL HX Feels better but still some frustrated VITAL SIGNS: Temp:  [98.3 F (36.8 C)-98.4 F (36.9 C)] 98.3 F (36.8 C) (06/10 1219) Pulse Rate:  [64-77] 73 (06/10 1219) Resp:  [18-20] 20 (06/10 1219) BP: (132-148)/(78-99) 148/78 (06/10 1219) SpO2:  [98 %-100 %] 100 % (06/10 1219) Weight:  [156 lb 11.2 oz (71.1 kg)] 156 lb 11.2 oz (71.1 kg) (06/10 0629) Room air PHYSICAL EXAMINATION: General: This is a 32 year old male currently resting and in no distress HEENT normocephalic atraumatic no jugular venous distention upper airway noises much improved without upper airway no wheezing, remains some what turbulent but improved compared to prior exam Pulmonary: Clear to auscultation Cardiac: Regular rate and  rhythm Abdomen: Soft nontender Extremities: Brisk cap refill no edema Neuro: Awake oriented no deficits  PULMONARY No results for input(s): PHART, PCO2ART, PO2ART, HCO3, TCO2, O2SAT in the last 168 hours.  Invalid input(s): PCO2, PO2 CBC Recent Labs  Lab 10/21/17 0528 10/22/17 0453  HGB 14.6 15.8  HCT 43.5 47.0  WBC 13.2* 12.9*  PLT 265 298   COAGULATION No results for input(s): INR in the last 168 hours.  CARDIAC  No results for input(s): TROPONINI in the last 168 hours. No results for input(s): PROBNP in the last 168 hours.  CHEMISTRY Recent Labs  Lab 10/21/17 0528 10/22/17 0453 10/23/17 0352 10/24/17 0648 10/25/17 0643  NA 134* 135 134* 135 134*  K 5.1 4.4 4.6 4.6 4.7  CL 98* 99* 96* 96* 94*  CO2 _0 32  GLUCOSE 121* 130* 122* 114* 113*  BUN 17 17 21* 20 18  CREATININE 0.75 0.84 0.85 0.88 0.87  CALCIUM 9.3 9.2 9.7 9.5 9.5  MG  --  2.3  --   --   --    Estimated Creatinine Clearance: 121.5 mL/min (by C-G formula based on SCr of 0.87 mg/dL).  LIVER No results for input(s): AST, ALT, ALKPHOS, BILITOT, PROT, ALBUMIN, INR in the last 168 hours. INFECTIOUS Recent Labs  Lab 10/22/17 0453  LATICACIDVEN 2.7*   ENDOCRINE CBG (last 3)  Recent Labs    10/25/17 0735 10/26/17 0811 10/27/17 0750  GLUCAP 124* 120* 106*   I reviewed CXR myself, bilateral lower lobe opacification noted  ASSESSMENT / PLAN:  Status asthmaticus Thrush  Cough Hypoxemia  Hemoptysis (resolved) Allergies Sinus  tachycardia  Syncope  Discussion S/p bronch on 6/7. BAL neg. Did find thrush on airway exam. He was started on fluconazole 6/7. Not sure how much of this is upper airway disease vs lower airway disease. Apparently he does not seem to respond to rescue SABA.   Plan/rec  Change solumedrol to pred 81m/d Cont diflucan & nystatin (would complete at least 10d)   Cont H2B Change PPI to BID  Cont tessalon and Hycodan  We will go ahead and  get PFTs He will also  need repeat PFTs in the outpatient setting, if upper airway disease indeed continues to be concern [facility]  10/27/2017, 2:39 PM  Attending Note:  32year old male with upper airway syndrome, cough, hemoptysis and syncope likely from coughing so forcefully.  On exam, improved but present upper airway wheezing noted.  I reviewed CXR myself, no acute disease noted.  Discussed with PCCM-NP.  Cough:  - Tessalon and hycodan  Asthma:  - PFTs ordered  - Inhaled steroids  - Will need PFTs again as outpatient  Hemoptysis: resolved  - Observe  Upper airway cough syndrome:  - PFTs with ? Fixed vs variable obstruction  - Will f/u  - Cough control as above  PCCM will continue to follow.  Patient seen and examined, agree with above note.  I dictated the care and orders written for this patient under my direction.  YRush Farmer MAmboy

## 2017-10-27 NOTE — Progress Notes (Signed)
C/o of burning  Pain on the inside stomach on left side, past few days no nausea, significant other reports blood from rectum, pt flushed so unable to assess bm, used wet rag to clean, brown stool with scant amout of blood tinged, paged Dr Isidoro Donningai

## 2017-10-27 NOTE — Progress Notes (Signed)
[facility]                                                                              Patient Demographics  Shannon Knapp, is a 32 y.o. male, DOB - [birth year], JYN:[id number]  Admit date - 10/17/2017   Admitting Physician Lady Deutscher, MD  Outpatient Primary MD for the patient is Patient, No Pcp Per  Outpatient specialists:   LOS - 10  days   Medical records reviewed and are as summarized below:    Chief Complaint  Patient presents with  . COPD       Brief summary   Patient is a 32 year old male with history of asthma, past smoker, quit March 2019, presented to pulmonary office for follow-up with Dr. Vaughan Browner, had reported 3 days of dyspnea, wheezing with coughing, mucus phlegm and hemoptysis.  Patient was sent to ED for further work-up.  CCM has been following.  Assessment & Plan    Principal Problem:   Severe persistent acute asthmatic bronchitis - patient also reports family history of asthma with father, uncle and now his son, did have asthma as a child. Work-up so far negative, AAT negative, HIV negative, IgE pending - chest x-ray shows no infiltrates, sputum culture showed normal flora.  Respiratory virus panel negative -currently on  Dulera, Mucinex, tessalon Perles, Hycodan -2D echo showed EF 65 to 70% with normal diastolic function, no regional wall motion abnormality. -CT chest showed minimal central bronchial thickening and dependent atelectasis in the both lower lobe -underwent bronchoscopy which showed evidence of thrush otherwise no abnormalities. - upset over lack of diagnosis, awaiting further pulmonology recommendations, PFTs inpatient vs outpatient.  Solu-Medrol was weaned down to 40 mg every 12 hours yesterday.  Wheezing has significantly improved.  Active Problems:  Oral thrush  Started on fluconazole x 7 days   Syncope/near syncope - Patient had reported 2 syncopal events, cardiology was consulted, recommended outpatient  event monitor, do not think he needs implantable loop recorder    Hemoptysis -Likely due to acute tracheobronchitis from #1, continue Hycodan, Tessalon Perles -Continue SCDs   Sinus tachycardia -Resolved  Code Status: Full CODE STATUS DVT Prophylaxis: SCD's Family Communication: Discussed in detail with the patient, all imaging results, lab results explained to the patient and fiance   Disposition Plan: When cleared by pulmonology  Time Spent in minutes 25 minutes  Procedures:  2D echo EF 65 to 70%, normal motion abnormalities  Consultants:   Pulmonary Cardiology  Antimicrobials:      Medications  Scheduled Meds: . benzonatate  200 mg Oral TID  . dextromethorphan-guaiFENesin  1 tablet Oral BID  . fluconazole  100 mg Oral Daily  . guaiFENesin  600 mg Oral BID  . methylPREDNISolone (SOLU-MEDROL) injection  40 mg Intravenous Q12H  . mometasone-formoterol  2 puff Inhalation BID  . multivitamin with minerals  1 tablet Oral Daily  . nystatin  5 mL Mouth/Throat QID  . pantoprazole  40 mg Oral Daily  . protein supplement shake  11 oz Oral TID BM  . sodium chloride flush  3 mL Intravenous Q12H  . traMADol  100 mg Oral Q6H  Continuous Infusions: . sodium chloride    . sodium chloride 10 mL/hr at 10/24/17 1447   PRN Meds:.sodium chloride, acetaminophen **OR** acetaminophen, diphenhydrAMINE, hydrALAZINE, HYDROcodone-homatropine, levalbuterol, ondansetron **OR** ondansetron (ZOFRAN) IV, sodium chloride flush, traMADol   Antibiotics   Anti-infectives (From admission, onward)   Start     Dose/Rate Route Frequency Ordered Stop   10/24/17 1800  fluconazole (DIFLUCAN) tablet 100 mg     100 mg Oral Daily 10/24/17 1722 10/29/17 0959   10/18/17 1800  levofloxacin (LEVAQUIN) tablet 750 mg  Status:  Discontinued     750 mg Oral Daily 10/18/17 1033 10/18/17 1039   10/18/17 1800  levofloxacin (LEVAQUIN) tablet 500 mg     500 mg Oral Daily 10/18/17 1039 10/21/17 1646   10/17/17  1800  levofloxacin (LEVAQUIN) IVPB 750 mg  Status:  Discontinued     750 mg 100 mL/hr over 90 Minutes Intravenous Every 24 hours 10/17/17 1620 10/18/17 1033        Subjective:   Roswell Nickel was seen and examined today.  Reported itching otherwise wheezing and shortness of breath is improving.  No fevers or chills or chest pain, no nausea or vomiting.  States left-sided rib pains.  Objective:   Vitals:   10/26/17 1932 10/26/17 2052 10/27/17 0629 10/27/17 0843  BP:  132/87 (!) 148/99   Pulse:  64 77   Resp:  18 18   Temp:  98.4 F (36.9 C) 98.4 F (36.9 C)   TempSrc:  Oral Oral   SpO2: 100% 100% 100% 98%  Weight:   71.1 kg (156 lb 11.2 oz)   Height:        Intake/Output Summary (Last 24 hours) at 10/27/2017 1053 Last data filed at 10/27/2017 0932 Gross per 24 hour  Intake 3203 ml  Output 4950 ml  Net -1747 ml     Wt Readings from Last 3 Encounters:  10/27/17 71.1 kg (156 lb 11.2 oz)  10/17/17 74 kg (163 lb 3.2 oz)  09/07/17 77.6 kg (171 lb)     Exam  General: Alert and oriented x 3, NAD Eyes:  HEENT:  Atraumatic, normocephalic Cardiovascular: S1 S2 auscultated, Regular rate and rhythm. No pedal edema b/l Respiratory: Minimal wheezing Gastrointestinal: Soft, nontender, nondistended, + bowel sounds Ext: no pedal edema bilaterally Neuro: no new deficits Musculoskeletal: No digital cyanosis, clubbing Skin: No rashes Psych: Normal affect and demeanor, alert and oriented x3       Data Reviewed:  I have personally reviewed following labs and imaging studies  Micro Results Recent Results (from the past 240 hour(s))  Respiratory Panel by PCR     Status: None   Collection Time: 10/18/17 11:12 AM  Result Value Ref Range Status   Adenovirus NOT DETECTED NOT DETECTED Final   Coronavirus 229E NOT DETECTED NOT DETECTED Final   Coronavirus HKU1 NOT DETECTED NOT DETECTED Final   Coronavirus NL63 NOT DETECTED NOT DETECTED Final   Coronavirus OC43 NOT DETECTED  NOT DETECTED Final   Metapneumovirus NOT DETECTED NOT DETECTED Final   Rhinovirus / Enterovirus NOT DETECTED NOT DETECTED Final   Influenza A NOT DETECTED NOT DETECTED Final   Influenza B NOT DETECTED NOT DETECTED Final   Parainfluenza Virus 1 NOT DETECTED NOT DETECTED Final   Parainfluenza Virus 2 NOT DETECTED NOT DETECTED Final   Parainfluenza Virus 3 NOT DETECTED NOT DETECTED Final   Parainfluenza Virus 4 NOT DETECTED NOT DETECTED Final   Respiratory Syncytial Virus NOT DETECTED NOT DETECTED Final   Bordetella  pertussis NOT DETECTED NOT DETECTED Final   Chlamydophila pneumoniae NOT DETECTED NOT DETECTED Final   Mycoplasma pneumoniae NOT DETECTED NOT DETECTED Final    Comment: Performed at [facility], [place] [address], [place], [place] [address]  Culture, sputum-assessment     Status: None   Collection Time: 10/18/17 11:23 AM  Result Value Ref Range Status   Specimen Description SPUTUM  Final   Special Requests NONE  Final   Sputum evaluation   Final    THIS SPECIMEN IS ACCEPTABLE FOR SPUTUM CULTURE Performed at [facility], [address] [place], [place] [address]    Report Status 10/18/2017 FINAL  Final  Culture, respiratory (NON-Expectorated)     Status: None   Collection Time: 10/18/17 11:23 AM  Result Value Ref Range Status   Specimen Description SPUTUM  Final   Special Requests NONE Reflexed from [id number]  Final   Gram Stain   Final    RARE WBC PRESENT, PREDOMINANTLY PMN FEW YEAST FEW GRAM POSITIVE COCCI IN CHAINS FEW GRAM POSITIVE RODS RARE GRAM NEGATIVE RODS    Culture   Final    Consistent with normal respiratory flora. Performed at [facility], [place] [address], [place], [place] [address]    Report Status 10/20/2017 FINAL  Final  Culture, bal-quantitative     Status: None   Collection Time: 10/24/17  3:55 PM  Result Value Ref Range Status   Specimen Description BRONCHIAL ALVEOLAR LAVAGE  Final   Special Requests NONE  Final   Gram Stain    Final    RARE WBC PRESENT, PREDOMINANTLY MONONUCLEAR NO ORGANISMS SEEN    Culture   Final    RARE Consistent with normal respiratory flora. Performed at [facility], [place] [address] [place], [place] [address]    Report Status 10/27/2017 FINAL  Final    Radiology Reports Dg Chest 2 View  Result Date: 10/17/2017 CLINICAL DATA:  Cough EXAM: CHEST - 2 VIEW COMPARISON:  None. FINDINGS: Lungs are hyperexpanded. There is no edema or consolidation. Heart size and pulmonary vascularity are normal. No adenopathy. No evident bone lesions. IMPRESSION: Lungs hyperexpanded without edema or consolidation. Heart size normal. Electronically Signed   By: Lowella Grip III M.D.   On: 10/17/2017 12:29   Ct Chest W Contrast  Result Date: 10/22/2017 CLINICAL DATA:  Chest pain or shortness of breath. Pleurisy or effusion suspected. Patient reports hemoptysis, dyspnea and wheezing. EXAM: CT CHEST WITH CONTRAST TECHNIQUE: Multidetector CT imaging of the chest was performed during intravenous contrast administration. CONTRAST:  152m OMNIPAQUE IOHEXOL 300 MG/ML  SOLN COMPARISON:  Radiographs yesterday and over the past week. FINDINGS: Cardiovascular: No significant vascular findings. Normal heart size. No pericardial effusion. Mediastinum/Nodes: No enlarged mediastinal or hilar lymph nodes. No mediastinal mass. The esophagus is decompressed. No thyroid nodule. Lungs/Pleura: Minimal dependent atelectasis in both lower lobes. Mild central bronchial thickening. Trachea and mainstem bronchi are otherwise patent. No confluent airspace disease. No pneumothorax or pulmonary mass. No pulmonary edema. No pleural fluid. Upper Abdomen: No acute abnormality. Musculoskeletal: There are no acute or suspicious osseous abnormalities. IMPRESSION: Minimal central bronchial thickening and dependent atelectasis in both lower lobes. No pleural effusion or other explanation for shortness of breath. Electronically Signed   By: MJeb LeveringM.D.   On: 10/22/2017 23:10   Dg Chest Port 1 View  Result Date: 10/21/2017 CLINICAL DATA:  Shortness of breath, cough EXAM: PORTABLE CHEST 1 VIEW COMPARISON:  10/18/2017 FINDINGS:  Heart and mediastinal contours are within normal limits. No focal opacities or effusions. No acute bony abnormality. IMPRESSION: No active disease. Electronically Signed   By: Rolm Baptise M.D.   On: 10/21/2017 07:52   Dg Chest Port 1 View  Result Date: 10/18/2017 CLINICAL DATA:  32 year old male with asthma exacerbation EXAM: PORTABLE CHEST 1 VIEW COMPARISON:  Prior chest x-ray 10/17/2017 FINDINGS: The lungs are clear and negative for focal airspace consolidation, pulmonary edema or suspicious pulmonary nodule. The degree of pulmonary hyperinflation is similar compared to prior. No pleural effusion or pneumothorax. Cardiac and mediastinal contours are within normal limits. No acute fracture or lytic or blastic osseous lesions. The visualized upper abdominal bowel gas pattern is unremarkable. IMPRESSION: 1. No acute cardiopulmonary process. 2. Similar degree of pulmonary hyperinflation. Electronically Signed   By: Jacqulynn Cadet M.D.   On: 10/18/2017 12:34   Dg Chest Portable 1 View  Result Date: 10/17/2017 CLINICAL DATA:  Shortness of breath today. EXAM: PORTABLE CHEST 1 VIEW COMPARISON:  PA and lateral chest 10/17/2017. FINDINGS: The chest is hyperexpanded but the lungs are clear. No pneumothorax or pleural effusion. Heart size is normal. No acute bony abnormality. IMPRESSION: Pulmonary hyperexpansion.  Lungs are clear. Electronically Signed   By: Inge Rise M.D.   On: 10/17/2017 15:38    Lab Data:  CBC: Recent Labs  Lab 10/21/17 0528 10/22/17 0453  WBC 13.2* 12.9*  NEUTROABS 10.9*  --   HGB 14.6 15.8  HCT 43.5 47.0  MCV 90.8 90.4  PLT 265 211   Basic Metabolic Panel: Recent Labs  Lab 10/21/17 0528 10/22/17 0453 10/23/17 0352 10/24/17 0648 10/25/17 0643  NA 134* 135 134* 135 134*  K  5.1 4.4 4.6 4.6 4.7  CL 98* 99* 96* 96* 94*  CO2 _0 32  GLUCOSE 121* 130* 122* 114* 113*  BUN 17 17 21* 20 18  CREATININE 0.75 0.84 0.85 0.88 0.87  CALCIUM 9.3 9.2 9.7 9.5 9.5  MG  --  2.3  --   --   --    GFR: Estimated Creatinine Clearance: 121.5 mL/min (by C-G formula based on SCr of 0.87 mg/dL). Liver Function Tests: No results for input(s): AST, ALT, ALKPHOS, BILITOT, PROT, ALBUMIN in the last 168 hours. No results for input(s): LIPASE, AMYLASE in the last 168 hours. No results for input(s): AMMONIA in the last 168 hours. Coagulation Profile: No results for input(s): INR, PROTIME in the last 168 hours. Cardiac Enzymes: No results for input(s): CKTOTAL, CKMB, CKMBINDEX, TROPONINI in the last 168 hours. BNP (last 3 results) No results for input(s): PROBNP in the last 8760 hours. HbA1C: No results for input(s): HGBA1C in the last 72 hours. CBG: Recent Labs  Lab 10/24/17 0754 10/24/17 1642 10/25/17 0735 10/26/17 0811 10/27/17 0750  GLUCAP 110* 118* 124* 120* 106*   Lipid Profile: No results for input(s): CHOL, HDL, LDLCALC, TRIG, CHOLHDL, LDLDIRECT in the last 72 hours. Thyroid Function Tests: No results for input(s): TSH, T4TOTAL, FREET4, T3FREE, THYROIDAB in the last 72 hours. Anemia Panel: No results for input(s): VITAMINB12, FOLATE, FERRITIN, TIBC, IRON, RETICCTPCT in the last 72 hours. Urine analysis:    Component Value Date/Time   COLORURINE STRAW (A) 10/18/2017 [place] 10/18/2017 0751   LABSPEC 1.006 10/18/2017 0751   PHURINE 6.0 10/18/2017 0751   GLUCOSEU 50 (A) 10/18/2017 0751   HGBUR NEGATIVE 10/18/2017 0751   BILIRUBINUR NEGATIVE 10/18/2017 0751   KETONESUR NEGATIVE 10/18/2017 0751   PROTEINUR  NEGATIVE 10/18/2017 0751   NITRITE NEGATIVE 10/18/2017 0751   LEUKOCYTESUR NEGATIVE 10/18/2017 0751     Raine Blodgett M.D. [facility] 10/27/2017, 10:53 AM  Pager: [phone] Between 7am to 7pm - call Pager -  [phone]  After 7pm go to [email or web address] - password [email or web address]  Call night coverage person covering after 7pm

## 2017-10-27 NOTE — Progress Notes (Signed)
Brief Nutrition Follow-Up Note  Case discussed with RN, who reports pt is refusing Premier Protein shakes, due to preferred flavor unavailable. She is requesting RD substitute supplement.   Pt remains with good appetite. Noted meal completion 100%.   RD will d/c Premier Protein. Will d/c MVI with minerals and add Hormel Shake TID, each supplement provides 520 kcals ans 22 grams protein.   RD will continue to follow.  Ala Kratz A. Mayford KnifeWilliams, RD, LDN, CDE Pager: [phone] After hours Pager: [phone]

## 2017-10-28 ENCOUNTER — Inpatient Hospital Stay (HOSPITAL_COMMUNITY): Payer: Self-pay

## 2017-10-28 LAB — IGE: IgE (Immunoglobulin E), Serum: 2209 IU/mL — ABNORMAL HIGH (ref 6–495)

## 2017-10-28 MED ORDER — HYDROCORTISONE 0.5 % EX CREA
TOPICAL_CREAM | Freq: Three times a day (TID) | CUTANEOUS | Status: DC
  Administered 2017-10-28 – 2017-10-29 (×6): via TOPICAL
  Filled 2017-10-28: qty 28.35

## 2017-10-28 MED ORDER — PREDNISONE 20 MG PO TABS
40.0000 mg | ORAL_TABLET | Freq: Every day | ORAL | Status: DC
  Administered 2017-10-29 – 2017-10-30 (×2): 40 mg via ORAL
  Filled 2017-10-28 (×2): qty 2

## 2017-10-28 MED ORDER — PANTOPRAZOLE SODIUM 40 MG PO TBEC
40.0000 mg | DELAYED_RELEASE_TABLET | Freq: Two times a day (BID) | ORAL | Status: DC
  Administered 2017-10-28 – 2017-10-30 (×4): 40 mg via ORAL
  Filled 2017-10-28 (×4): qty 1

## 2017-10-28 NOTE — Progress Notes (Signed)
Nutrition Follow-up  DOCUMENTATION CODES:   Severe malnutrition in context of acute illness/injury  INTERVENTION:   -Continue Hormel Shake TID, each supplement provides 520 kcals and 22 grams protein -Continue MVI with minerals  NUTRITION DIAGNOSIS:   Severe Malnutrition(Acute) related to poor appetite, acute illness(Severe persistent Acute asthmatic bronchits) as evidenced by energy intake < or equal to 75% for > or equal to 1 month, percent weight loss.  Ongoing  GOAL:   Patient will meet greater than or equal to 90% of their needs  Progressing  MONITOR:   PO intake, Supplement acceptance, Labs, Weight trends, I & O's  REASON FOR ASSESSMENT:   Malnutrition Screening Tool    ASSESSMENT:   32 y.o. 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   Spoke with pt at bedside, who expressed frustration over prolonged hospitalization. He reports that he experienced some respiratory distress earlier today, which was resolved with oxygen. He reports that his appetite is fair and is eating what he can. Noted meal completion 65-100%. He started refusing Premier Protein supplements due to preferred flavor unavailable. He consumed approximately 75% of Hormel Shake for breakfast which he enjoyed. Pt amenable to continue supplement intervention.   Medications reviewed and include prednisone.   Labs reviewed: Na: 134, CBGS: 106-124 (suspect elevated CBGS related to steroid use).  NUTRITION - FOCUSED PHYSICAL EXAM:    Most Recent  Value  Orbital Region  No depletion  Upper Arm Region  No depletion  Thoracic and Lumbar Region  No depletion  Buccal Region  No depletion  Temple Region  No depletion  Clavicle Bone Region  Mild depletion  Clavicle and Acromion Bone Region  No depletion  Scapular Bone Region  No depletion  Dorsal Hand  No depletion  Patellar Region  No depletion  Anterior Thigh Region  No depletion  Posterior Calf Region  No depletion  Edema (RD Assessment)  None  Hair  Reviewed  Eyes  Reviewed  Mouth  Reviewed  Skin  Reviewed  Nails  Reviewed       Diet Order:   Diet Order           Diet regular Room service appropriate? Yes; Fluid consistency: Thin  Diet effective now          EDUCATION NEEDS:   No education needs have been identified at this time  Skin:  Skin Assessment: Reviewed RN Assessment  Last BM:  10/27/17  Height:   Ht Readings from Last 1 Encounters:  10/19/17 5\' 11"  (1.803 m)    Weight:   Wt Readings from Last 1 Encounters:  10/28/17 155 lb 8 oz (70.5 kg)    Ideal Body Weight:  78.18 kg  BMI:  Body mass index is 21.69 kg/m.  Estimated Nutritional Needs:   Kcal:  2250-2450  Protein:  115-130 grams  Fluid:  > 2.2 L    Shannon Knapp, RD, LDN, CDE Pager: [phone] After hours Pager: [phone]

## 2017-10-28 NOTE — Progress Notes (Signed)
[facility]                                                                              Patient Demographics  Shannon Knapp, is a 32 y.o. male, DOB - 08-05-1985, TJQ:[id number]  Admit date - 10/17/2017   Admitting Physician Lady Deutscher, MD  Outpatient Primary MD for the patient is Patient, No Pcp Per  Outpatient specialists:   LOS - 11  days   Medical records reviewed and are as summarized below:    Chief Complaint  Patient presents with  . COPD       Brief summary   Patient is a 32 year old male with history of asthma, past smoker, quit March 2019, presented to pulmonary office for follow-up with Dr. Vaughan Browner, had reported 3 days of dyspnea, wheezing with coughing, mucus phlegm and hemoptysis.  Patient was sent to ED for further work-up.  CCM has been following.  Assessment & Plan    Principal Problem:   Severe persistent acute asthmatic bronchitis - patient also reports family history of asthma with father, uncle and now his son, did have asthma as a child. Work-up so far negative, AAT negative, HIV negative, IgE pending - chest x-ray shows no infiltrates, sputum culture showed normal flora.  Respiratory virus panel negative -currently on  Dulera, Mucinex, tessalon Perles, Hycodan -2D echo showed EF 65 to 70% with normal diastolic function, no regional wall motion abnormality. -CT chest showed minimal central bronchial thickening and dependent atelectasis in the both lower lobe -underwent bronchoscopy on 6/7 which showed evidence of thrush otherwise no abnormalities. -Wheezing has significantly improved, PFTs today  Active Problems:  Left-sided abdominal pain: Likely due to coughing, CT abdomen did not show any acute abdominal pathology.  Continue PPI  Oral thrush  Started on fluconazole x 7 days   Syncope/near syncope - Patient had reported 2 syncopal events, cardiology was consulted, recommended outpatient event monitor, do not think he  needs implantable loop recorder    Hemoptysis -Likely due to acute tracheobronchitis from #1, continue Hycodan, Tessalon Perles -Continue SCDs   Sinus tachycardia -Resolved  Code Status: Full CODE STATUS DVT Prophylaxis: SCD's Family Communication: Discussed in detail with the patient, all imaging results, lab results explained to the patient and fiance   Disposition Plan: When cleared by pulmonology  Time Spent in minutes 25 minutes  Procedures:  2D echo EF 65 to 70%, normal motion abnormalities  Consultants:   Pulmonary Cardiology  Antimicrobials:      Medications  Scheduled Meds: . benzonatate  200 mg Oral TID  . dextromethorphan-guaiFENesin  1 tablet Oral BID  . guaiFENesin  600 mg Oral BID  . hydrocortisone  25 mg Rectal BID  . methylPREDNISolone (SOLU-MEDROL) injection  40 mg Intravenous Q12H  . mometasone-formoterol  2 puff Inhalation BID  . multivitamin with minerals  1 tablet Oral Daily  . nystatin  5 mL Mouth/Throat QID  . pantoprazole  40 mg Oral Daily  . sodium chloride flush  3 mL Intravenous Q12H  . traMADol  100 mg Oral Q6H   Continuous Infusions: . sodium chloride    . sodium chloride 10  mL/hr at 10/24/17 1447   PRN Meds:.sodium chloride, acetaminophen **OR** acetaminophen, diphenhydrAMINE, hydrALAZINE, levalbuterol, ondansetron **OR** ondansetron (ZOFRAN) IV, sodium chloride flush, traMADol   Antibiotics   Anti-infectives (From admission, onward)   Start     Dose/Rate Route Frequency Ordered Stop   10/24/17 1800  fluconazole (DIFLUCAN) tablet 100 mg     100 mg Oral Daily 10/24/17 1722 10/28/17 0928   10/18/17 1800  levofloxacin (LEVAQUIN) tablet 750 mg  Status:  Discontinued     750 mg Oral Daily 10/18/17 1033 10/18/17 1039   10/18/17 1800  levofloxacin (LEVAQUIN) tablet 500 mg     500 mg Oral Daily 10/18/17 1039 10/21/17 1646   10/17/17 1800  levofloxacin (LEVAQUIN) IVPB 750 mg  Status:  Discontinued     750 mg 100 mL/hr over 90  Minutes Intravenous Every 24 hours 10/17/17 1620 10/18/17 1033        Subjective:   Shannon Knapp was seen and examined today.  No acute complaints, waiting for PFTs today.  Overall improving, did not hear any wheezing however patient states had wheezing at night.  No fevers, chills, coughing or chest pain.  Objective:   Vitals:   10/27/17 1219 10/27/17 2118 10/28/17 0500 10/28/17 0803  BP: (!) 148/78 137/84 (!) 141/93   Pulse: 73 70 75   Resp: _0 Temp: 98.3 F (36.8 C) 97.6 F (36.4 C) 98.1 F (36.7 C)   TempSrc: Oral Oral Oral   SpO2: 100% 100% 97% 100%  Weight:   70.5 kg (155 lb 8 oz)   Height:        Intake/Output Summary (Last 24 hours) at 10/28/2017 0937 Last data filed at 10/28/2017 0929 Gross per 24 hour  Intake 3463 ml  Output 4200 ml  Net -737 ml     Wt Readings from Last 3 Encounters:  10/28/17 70.5 kg (155 lb 8 oz)  10/17/17 74 kg (163 lb 3.2 oz)  09/07/17 77.6 kg (171 lb)     Exam  General: Alert and oriented x 3, NAD Eyes:  HEENT:   Cardiovascular: S1 S2 auscultated, . Regular rate and rhythm. No pedal edema b/l Respiratory: Fairly CTA B Gastrointestinal: Soft, nontender, nondistended, + bowel sounds Ext: no pedal edema bilaterally Neuro: no new deficits Musculoskeletal: No digital cyanosis, clubbing Skin: No rashes Psych: Normal affect and demeanor, alert and oriented x3       Data Reviewed:  I have personally reviewed following labs and imaging studies  Micro Results Recent Results (from the past 240 hour(s))  Respiratory Panel by PCR     Status: None   Collection Time: 10/18/17 11:12 AM  Result Value Ref Range Status   Adenovirus NOT DETECTED NOT DETECTED Final   Coronavirus 229E NOT DETECTED NOT DETECTED Final   Coronavirus HKU1 NOT DETECTED NOT DETECTED Final   Coronavirus NL63 NOT DETECTED NOT DETECTED Final   Coronavirus OC43 NOT DETECTED NOT DETECTED Final   Metapneumovirus NOT DETECTED NOT DETECTED Final    Rhinovirus / Enterovirus NOT DETECTED NOT DETECTED Final   Influenza A NOT DETECTED NOT DETECTED Final   Influenza B NOT DETECTED NOT DETECTED Final   Parainfluenza Virus 1 NOT DETECTED NOT DETECTED Final   Parainfluenza Virus 2 NOT DETECTED NOT DETECTED Final   Parainfluenza Virus 3 NOT DETECTED NOT DETECTED Final   Parainfluenza Virus 4 NOT DETECTED NOT DETECTED Final   Respiratory Syncytial Virus NOT DETECTED NOT DETECTED Final   Bordetella pertussis NOT DETECTED NOT DETECTED  Final   Chlamydophila pneumoniae NOT DETECTED NOT DETECTED Final   Mycoplasma pneumoniae NOT DETECTED NOT DETECTED Final    Comment: Performed at [facility], [place] [address] [place], [place] [address]  Culture, sputum-assessment     Status: None   Collection Time: 10/18/17 11:23 AM  Result Value Ref Range Status   Specimen Description SPUTUM  Final   Special Requests NONE  Final   Sputum evaluation   Final    THIS SPECIMEN IS ACCEPTABLE FOR SPUTUM CULTURE Performed at [facility], [address], [place], [place] [address]    Report Status 10/18/2017 FINAL  Final  Culture, respiratory (NON-Expectorated)     Status: None   Collection Time: 10/18/17 11:23 AM  Result Value Ref Range Status   Specimen Description SPUTUM  Final   Special Requests NONE Reflexed from [id number]  Final   Gram Stain   Final    RARE WBC PRESENT, PREDOMINANTLY PMN FEW YEAST FEW GRAM POSITIVE COCCI IN CHAINS FEW GRAM POSITIVE RODS RARE GRAM NEGATIVE RODS    Culture   Final    Consistent with normal respiratory flora. Performed at [facility], [place] [address], [place], [place] [address]    Report Status 10/20/2017 FINAL  Final  Culture, bal-quantitative     Status: None   Collection Time: 10/24/17  3:55 PM  Result Value Ref Range Status   Specimen Description BRONCHIAL ALVEOLAR LAVAGE  Final   Special Requests NONE  Final   Gram Stain   Final    RARE WBC PRESENT, PREDOMINANTLY MONONUCLEAR NO ORGANISMS SEEN      Culture   Final    RARE Consistent with normal respiratory flora. Performed at [facility], [place] [address] [place], [place] [address]    Report Status 10/27/2017 FINAL  Final  Pneumocystis smear by DFA     Status: None   Collection Time: 10/24/17  3:55 PM  Result Value Ref Range Status   Specimen Source-PJSRC BRONCHIAL ALVEOLAR LAVAGE  Final   Pneumocystis jiroveci Ag NEGATIVE  Final    Comment: Performed at [facility] Performed at [facility], [address], [place], [place] [address]     Radiology Reports Dg Chest 2 View  Result Date: 10/17/2017 CLINICAL DATA:  Cough EXAM: CHEST - 2 VIEW COMPARISON:  None. FINDINGS: Lungs are hyperexpanded. There is no edema or consolidation. Heart size and pulmonary vascularity are normal. No adenopathy. No evident bone lesions. IMPRESSION: Lungs hyperexpanded without edema or consolidation. Heart size normal. Electronically Signed   By: Lowella Grip III M.D.   On: 10/17/2017 12:29   Ct Chest W Contrast  Result Date: 10/22/2017 CLINICAL DATA:  Chest pain or shortness of breath. Pleurisy or effusion suspected. Patient reports hemoptysis, dyspnea and wheezing. EXAM: CT CHEST WITH CONTRAST TECHNIQUE: Multidetector CT imaging of the chest was performed during intravenous contrast administration. CONTRAST:  172m OMNIPAQUE IOHEXOL 300 MG/ML  SOLN COMPARISON:  Radiographs yesterday and over the past week. FINDINGS: Cardiovascular: No significant vascular findings. Normal heart size. No pericardial effusion. Mediastinum/Nodes: No enlarged mediastinal or hilar lymph nodes. No mediastinal mass. The esophagus is decompressed. No thyroid nodule. Lungs/Pleura: Minimal dependent atelectasis in both lower lobes. Mild central bronchial thickening. Trachea and mainstem bronchi are otherwise patent. No confluent airspace disease. No pneumothorax or pulmonary mass. No pulmonary edema. No pleural fluid. Upper Abdomen: No acute abnormality.  Musculoskeletal: There are no acute or suspicious osseous abnormalities. IMPRESSION: Minimal  central bronchial thickening and dependent atelectasis in both lower lobes. No pleural effusion or other explanation for shortness of breath. Electronically Signed   By: Jeb Levering M.D.   On: 10/22/2017 23:10   Ct Abdomen Pelvis W Contrast  Result Date: 10/27/2017 CLINICAL DATA:  Burning abdominal pain, asthma EXAM: CT ABDOMEN AND PELVIS WITH CONTRAST TECHNIQUE: Multidetector CT imaging of the abdomen and pelvis was performed using the standard protocol following bolus administration of intravenous contrast. CONTRAST:  141m OMNIPAQUE IOHEXOL 300 MG/ML  SOLN COMPARISON:  10/22/2017 FINDINGS: Lower chest: Mild hyperinflation of the lungs. No focal pneumonia or airspace process. No pleural abnormality, effusion or pneumothorax. Heart size. Hepatobiliary: No focal liver abnormality is seen. No gallstones, gallbladder wall thickening, or biliary dilatation. Pancreas: Unremarkable. No pancreatic ductal dilatation or surrounding inflammatory changes. Spleen: Normal in size without focal abnormality. Adrenals/Urinary Tract: Adrenal glands are unremarkable. Kidneys are normal, without renal calculi, focal lesion, or hydronephrosis. Bladder is unremarkable. Stomach/Bowel: Negative for bowel obstruction, significant dilatation, ileus, free air. Moderate colonic stool burden. Appendix not visualized. No acute inflammatory process, fluid collection, abscess or hemorrhage. Vascular/Lymphatic: No significant vascular findings are present. No enlarged abdominal or pelvic lymph nodes. Reproductive: Prostate is unremarkable. Other: No abdominal wall hernia or abnormality. No abdominopelvic ascites. Musculoskeletal: No acute or significant osseous findings. IMPRESSION: No acute intra-abdominal or pelvic finding by CT. Electronically Signed   By: MJerilynn Mages  Shick M.D.   On: 10/27/2017 21:52   Dg Chest Port 1 View  Result Date:  10/21/2017 CLINICAL DATA:  Shortness of breath, cough EXAM: PORTABLE CHEST 1 VIEW COMPARISON:  10/18/2017 FINDINGS: Heart and mediastinal contours are within normal limits. No focal opacities or effusions. No acute bony abnormality. IMPRESSION: No active disease. Electronically Signed   By: KRolm BaptiseM.D.   On: 10/21/2017 07:52   Dg Chest Port 1 View  Result Date: 10/18/2017 CLINICAL DATA:  32year old male with asthma exacerbation EXAM: PORTABLE CHEST 1 VIEW COMPARISON:  Prior chest x-ray 10/17/2017 FINDINGS: The lungs are clear and negative for focal airspace consolidation, pulmonary edema or suspicious pulmonary nodule. The degree of pulmonary hyperinflation is similar compared to prior. No pleural effusion or pneumothorax. Cardiac and mediastinal contours are within normal limits. No acute fracture or lytic or blastic osseous lesions. The visualized upper abdominal bowel gas pattern is unremarkable. IMPRESSION: 1. No acute cardiopulmonary process. 2. Similar degree of pulmonary hyperinflation. Electronically Signed   By: HJacqulynn CadetM.D.   On: 10/18/2017 12:34   Dg Chest Portable 1 View  Result Date: 10/17/2017 CLINICAL DATA:  Shortness of breath today. EXAM: PORTABLE CHEST 1 VIEW COMPARISON:  PA and lateral chest 10/17/2017. FINDINGS: The chest is hyperexpanded but the lungs are clear. No pneumothorax or pleural effusion. Heart size is normal. No acute bony abnormality. IMPRESSION: Pulmonary hyperexpansion.  Lungs are clear. Electronically Signed   By: TInge RiseM.D.   On: 10/17/2017 15:38    Lab Data:  CBC: Recent Labs  Lab 10/22/17 0453  WBC 12.9*  HGB 15.8  HCT 47.0  MCV 90.4  PLT 2606  Basic Metabolic Panel: Recent Labs  Lab 10/22/17 0453 10/23/17 0352 10/24/17 0648 10/25/17 0643  NA 135 134* 135 134*  K 4.4 4.6 4.6 4.7  CL 99* 96* 96* 94*  CO2 _0 32  GLUCOSE 130* 122* 114* 113*  BUN 17 21* 20 18  CREATININE 0.84 0.85 0.88 0.87  CALCIUM 9.2 9.7 9.5  9.5  MG 2.3  --   --   --  GFR: Estimated Creatinine Clearance: 120.4 mL/min (by C-G formula based on SCr of 0.87 mg/dL). Liver Function Tests: No results for input(s): AST, ALT, ALKPHOS, BILITOT, PROT, ALBUMIN in the last 168 hours. No results for input(s): LIPASE, AMYLASE in the last 168 hours. No results for input(s): AMMONIA in the last 168 hours. Coagulation Profile: No results for input(s): INR, PROTIME in the last 168 hours. Cardiac Enzymes: No results for input(s): CKTOTAL, CKMB, CKMBINDEX, TROPONINI in the last 168 hours. BNP (last 3 results) No results for input(s): PROBNP in the last 8760 hours. HbA1C: No results for input(s): HGBA1C in the last 72 hours. CBG: Recent Labs  Lab 10/24/17 0754 10/24/17 1642 10/25/17 0735 10/26/17 0811 10/27/17 0750  GLUCAP 110* 118* 124* 120* 106*   Lipid Profile: No results for input(s): CHOL, HDL, LDLCALC, TRIG, CHOLHDL, LDLDIRECT in the last 72 hours. Thyroid Function Tests: No results for input(s): TSH, T4TOTAL, FREET4, T3FREE, THYROIDAB in the last 72 hours. Anemia Panel: No results for input(s): VITAMINB12, FOLATE, FERRITIN, TIBC, IRON, RETICCTPCT in the last 72 hours. Urine analysis:    Component Value Date/Time   COLORURINE STRAW (A) 10/18/2017 0751   APPEARANCEUR CLEAR 10/18/2017 0751   LABSPEC 1.006 10/18/2017 0751   PHURINE 6.0 10/18/2017 0751   GLUCOSEU 50 (A) 10/18/2017 0751   HGBUR NEGATIVE 10/18/2017 0751   BILIRUBINUR NEGATIVE 10/18/2017 0751   KETONESUR NEGATIVE 10/18/2017 0751   PROTEINUR NEGATIVE 10/18/2017 0751   NITRITE NEGATIVE 10/18/2017 0751   LEUKOCYTESUR NEGATIVE 10/18/2017 0751     Shannon Knapp M.D. [facility] 10/28/2017, 9:37 AM  Pager: [phone] Between 7am to 7pm - call Pager - [phone]  After 7pm go to [email or web address] - password [email or web address]  Call night coverage person covering after 7pm

## 2017-10-28 NOTE — Progress Notes (Signed)
Name: Shannon Knapp MRN: [id number] DOB: Jan 29, 1986    ADMISSION DATE:  10/17/2017 CONSULTATION DATE:  10/17/2017  REFERRING MD :  Dr. Ashok Cordia   CHIEF COMPLAINT:  Dyspnea  brief 32 year old male with PMH of  Asthma, Past smoker (reportley quiet March 2019). Recent Visit to ED on 4/12 with purulent rhinorrhea, given doxycycline and 4/22 for cough/shortness of breath given steroid taper and nebs   5/31 Presented to Pulmonary Office (Dr. Vaughan Browner) with reported 3 days of dyspnea, white mucus production, and wheezing with noted hemoptysis. feno [id number] . Given Severity of dyspnea and wheezing sent to ED for further work-up. Given 125 mg solu-medrol, magnesium 2 mg, albuterol 10 mg and 20 mg, in addition to Atrovent with minimal improvement. PCCM asked to consult.    SIGNIFICANT EVENTS  5/31 > Presents to ED from office as  10/24/2017 fiberoptic bronchoscopy with thrush noted on posterior pharynx  CXR 5/31 > The chest is hyperexpanded but the lungs are clear. No pneumothorax or pleural effusion. Heart size is normal. No acute bony abnormality. CXR 6/4> Heart and mediastinal contours are within normal limits. No focal opacities or effusions.   SUBJECTIVE/OVERNIGHT/INTERVAL HX Reportedly witnessed to be wheezing at night by nursing staff.  Not clear to me if he was hypoxic however oxygen was applied.  Wheezing was noted to be audible at doorway.  Patient had been resting and sleeping needed to be woke up because of this.  He is quite frustrated now, although he feels better he is frustrated about wheezing at night  VITAL SIGNS: Temp:  [97.6 F (36.4 C)-98.3 F (36.8 C)] 98.1 F (36.7 C) (06/11 0500) Pulse Rate:  [70-75] 75 (06/11 0500) Resp:  [18-20] 18 (06/11 0500) BP: (137-148)/(78-93) 141/93 (06/11 0500) SpO2:  [97 %-100 %] 100 % (06/11 0803) Weight:  [155 lb 8 oz (70.5 kg)] 155 lb 8 oz (70.5 kg) (06/11 0500) Room air PHYSICAL EXAMINATION:  General: 32 year old male patient  currently resting in bed he is in no acute distress HEENT normocephalic atraumatic mucous membranes moist no audible upper airway wheeze at this point Exline pulmonary: Clear to auscultation no wheezing. Cardiac: Regular rate and rhythm Abdomen: Soft nontender Extremities: Brisk cap refill no edema Neuro: Awake alert oriented  PULMONARY No results for input(s): PHART, PCO2ART, PO2ART, HCO3, TCO2, O2SAT in the last 168 hours.  Invalid input(s): PCO2, PO2 CBC Recent Labs  Lab 10/22/17 0453  HGB 15.8  HCT 47.0  WBC 12.9*  PLT 298   COAGULATION No results for input(s): INR in the last 168 hours.  CARDIAC  No results for input(s): TROPONINI in the last 168 hours. No results for input(s): PROBNP in the last 168 hours.  CHEMISTRY Recent Labs  Lab 10/22/17 0453 10/23/17 0352 10/24/17 0648 10/25/17 0643  NA 135 134* 135 134*  K 4.4 4.6 4.6 4.7  CL 99* 96* 96* 94*  CO2 _0 32  GLUCOSE 130* 122* 114* 113*  BUN 17 21* 20 18  CREATININE 0.84 0.85 0.88 0.87  CALCIUM 9.2 9.7 9.5 9.5  MG 2.3  --   --   --    Estimated Creatinine Clearance: 120.4 mL/min (by C-G formula based on SCr of 0.87 mg/dL).  LIVER No results for input(s): AST, ALT, ALKPHOS, BILITOT, PROT, ALBUMIN, INR in the last 168 hours. INFECTIOUS Recent Labs  Lab 10/22/17 0453  LATICACIDVEN 2.7*   ENDOCRINE CBG (last 3)  Recent Labs    10/26/17 0811 10/27/17 0750  GLUCAP  120* 106*   I reviewed CXR myself, bilateral lower lobe opacification noted  ASSESSMENT / PLAN:  Status asthmaticus Thrush  Cough Hypoxemia  Hemoptysis (resolved) Allergies Sinus tachycardia  Syncope  Discussion S/p bronch on 6/7. BAL neg. Did find thrush on airway exam. He was started on fluconazole 6/7. Not sure how much of this is upper airway disease vs lower airway disease. Apparently he does not seem to respond to rescue SABA.  -had "wheezing" episode last night.  This was audible at doorway, nursing staff awoke him  with concern.  Oxygen was placed on him however not clear as to whether or not he desaturated.  Nighttime wheezing continues to be an issue.  This raises concern about possible reflux as a contributing factor.  Particularly because it seems most of his symptom burdens her upper airway at this point  Plan/rec  Change solumedrol to pred 56m/d Cont diflucan & nystatin (would complete at least 10d)   Cont H2B Change PPI to BID  We will get barium esophagram looking for evidence of esophageal reflux Cont tessalon and Hycodan  We will go ahead and  get PFTs He will also need repeat PFTs in the outpatient setting, if upper airway disease indeed continues to be concern [facility]   PErick ColaceACNP-BC LMammoth LakesPager # [phone] # [phone] no answer  10/28/2017, 10:29 AM

## 2017-10-28 NOTE — Plan of Care (Signed)
  Problem: Clinical Measurements: Goal: Ability to maintain clinical measurements within normal limits will improve Outcome: Progressing Goal: Will remain free from infection Outcome: Progressing Goal: Respiratory complications will improve Outcome: Progressing   

## 2017-10-29 DIAGNOSIS — J9601 Acute respiratory failure with hypoxia: Secondary | ICD-10-CM

## 2017-10-29 LAB — GLUCOSE, CAPILLARY: Glucose-Capillary: 95 mg/dL (ref 65–99)

## 2017-10-29 NOTE — Progress Notes (Signed)
Name: Shannon Knapp MRN: [id number] DOB: [birth year]    ADMISSION DATE:  10/17/2017 CONSULTATION DATE:  10/17/2017  REFERRING MD :  Dr. Ashok Cordia   CHIEF COMPLAINT:  Dyspnea  brief 32 year old male with PMH of  Asthma, Past smoker (reportley quiet March 2019). Recent Visit to ED on 4/12 with purulent rhinorrhea, given doxycycline and 4/22 for cough/shortness of breath given steroid taper and nebs   5/31 Presented to Pulmonary Office (Dr. Vaughan Browner) with reported 3 days of dyspnea, white mucus production, and wheezing with noted hemoptysis. feno 95 . Given Severity of dyspnea and wheezing sent to ED for further work-up. Given 125 mg solu-medrol, magnesium 2 mg, albuterol 10 mg and 20 mg, in addition to Atrovent with minimal improvement. PCCM asked to consult.    SIGNIFICANT EVENTS  5/31 > Presents to ED from office as  10/24/2017 fiberoptic bronchoscopy with thrush noted on posterior pharynx  CXR 5/31 > The chest is hyperexpanded but the lungs are clear. No pneumothorax or pleural effusion. Heart size is normal. No acute bony Abnormality.  10/28/2017 swallow negative  CXR 6/4> Heart and mediastinal contours are within normal limits. No focal opacities or effusions.   SUBJECTIVE/OVERNIGHT/INTERVAL HX Reports abdominal pain secondary to barium swallow study.  Somewhat angry and belligerent concerning mixed signals and ambiguity of medical staff.  Refractory to NP's attempt satisfy his questions.  VITAL SIGNS: Temp:  [97.5 F (36.4 C)-98.4 F (36.9 C)] 98.4 F (36.9 C) (06/12 0542) Pulse Rate:  [84-87] 84 (06/12 0542) Resp:  [17] 17 (06/12 0542) BP: (137)/(79-90) 137/90 (06/12 0542) SpO2:  [97 %-100 %] 97 % (06/12 0844) Weight:  [71.8 kg (158 lb 6.4 oz)] 71.8 kg (158 lb 6.4 oz) (06/12 0542) Room air PHYSICAL EXAMINATION:  General: Angry young man who is somewhat belligerent about getting mixed signals from medical staff.  He does not want to go out of the hospital had another  syncopal episode HEENT: No JVD or lymphadenopathy appreciated, continue to eat during exam therefore unable to evaluate his oropharynx he did not have a problem swallowing Neuro: Somewhat belligerent seems to be neurologically intact CV: Irregular regular rate and rhythm PULM: Respiratory status is unremarkable, good air movement, no overt wheezing noted GI: Soft, tenderness, planes of abdominal pain status post barium swallow Extremities: warm/dry, no edema edema  Skin: no rashes or lesions   PULMONARY No results for input(s): PHART, PCO2ART, PO2ART, HCO3, TCO2, O2SAT in the last 168 hours.  Invalid input(s): PCO2, PO2 CBC No results for input(s): HGB, HCT, WBC, PLT in the last 168 hours. COAGULATION No results for input(s): INR in the last 168 hours.  CARDIAC  No results for input(s): TROPONINI in the last 168 hours. No results for input(s): PROBNP in the last 168 hours.  CHEMISTRY Recent Labs  Lab 10/23/17 0352 10/24/17 0648 10/25/17 0643  NA 134* 135 134*  K 4.6 4.6 4.7  CL 96* 96* 94*  CO2 31 29 32  GLUCOSE 122* 114* 113*  BUN 21* 20 18  CREATININE 0.85 0.88 0.87  CALCIUM 9.7 9.5 9.5   Estimated Creatinine Clearance: 122.8 mL/min (by C-G formula based on SCr of 0.87 mg/dL).  LIVER No results for input(s): AST, ALT, ALKPHOS, BILITOT, PROT, ALBUMIN, INR in the last 168 hours. INFECTIOUS No results for input(s): LATICACIDVEN, PROCALCITON in the last 168 hours. ENDOCRINE CBG (last 3)  Recent Labs    10/27/17 0750 10/29/17 0731  GLUCAP 106* 95     ASSESSMENT / PLAN:  Status asthmaticus Thrush  Cough Hypoxemia  Hemoptysis (resolved) Allergies Sinus tachycardia  Syncope  Discussion S/p bronch on 6/7. BAL neg. Did find thrush on airway exam. He was started on fluconazole 6/7. Not sure how much of this is upper airway disease vs lower airway disease. Apparently he does not seem to respond to rescue SABA.  -had "wheezing" episode last night.  This was  audible at doorway, nursing staff awoke him with concern.  Oxygen was placed on him however not clear as to whether or not he desaturated.  Nighttime wheezing continues to be an issue.  This raises concern about possible reflux as a contributing factor.  Particularly because it seems most of his symptom burdens her upper airway at this point  Assessment /plan Solu-Medrol has been transitioned to prednisone Currently on Diflucan and nystatin with stop date He is on PPI and H2 blocker Esophagram was unremarkable 10/28/2017 PFTs need to be reviewed Not sure if we will be able to find a reason why he has syncope may be unrelated to his cough is just unknown at this time. Question if he cannot be followed up as outpatient for further evaluation and treatment   Richardson Landry Auston Halfmann ACNP Maryanna Shape PCCM Pager [phone] till 1 pm If no answer page [phone] 10/29/2017, 9:08 AM

## 2017-10-29 NOTE — Progress Notes (Signed)
[facility]                                                                              Patient Demographics  Shannon Knapp, is a 32 y.o. male, DOB - September 13, 1985, HUD:[id number]  Admit date - 10/17/2017   Admitting Physician Lady Deutscher, MD  Outpatient Primary MD for the patient is Patient, No Pcp Per  Outpatient specialists:   LOS - 12  days   Medical records reviewed and are as summarized below:    Chief Complaint  Patient presents with  . COPD       Brief summary   Patient is a 33 year old male with history of asthma, past smoker, quit March 2019, presented to pulmonary office for follow-up with Dr. Vaughan Browner, had reported 3 days of dyspnea, wheezing with coughing, mucus phlegm and hemoptysis.  Patient was sent to ED for further work-up.  CCM has been following.  Assessment & Plan    Principal Problem:   Severe persistent acute asthmatic bronchitis - patient also reports family history of asthma with father, uncle and now his son, did have asthma as a child. Work-up so far negative, AAT negative, HIV negative, IgE -VERY HIGH 2200 - chest x-ray shows no infiltrates, sputum culture showed normal flora.  Respiratory virus panel negative -currently on  Dulera, Mucinex, tessalon Perles, Hycodan -2D echo showed EF 65 to 70% with normal diastolic function, no regional wall motion abnormality. -CT chest showed minimal central bronchial thickening and dependent atelectasis in the both lower lobe -underwent bronchoscopy on 6/7 due to hemoptysis, which showed evidence of thrush otherwise no abnormalities. -no wheezing noted, inpatient PFTs to r/o VCD and then outpatient PFTs for formal Asthma diagnosis -slow prednisone taper from tomorrow, xopenex/atrovent, reports that Albuterol doesn't help -continue PPI/H2 blocker, dulera  Left-sided abdominal pain: Likely due to coughing, CT abdomen did not show any acute abdominal pathology.  Continue PPI  Oral thrush   Started on fluconazole x 7 days   Syncope/near syncope - Patient had reported 2 syncopal events, cardiology was consulted, recommended outpatient event monitor -no arrhythmias noted, ECHo normal    Hemoptysis -Likely due to acute tracheobronchitis from #1, continue Hycodan, Tessalon Perles -Continue SCDs   Sinus tachycardia -Resolved  Code Status: Full CODE STATUS DVT Prophylaxis: SCD's Family Communication: Discussed in detail with the patient, all imaging results, lab results explained to the patient and fiance   Disposition Plan: Home tomorrow  Time Spent in minutes 25 minutes  Procedures:  2D echo EF 65 to 70%, normal motion abnormalities  Consultants:   Pulmonary Cardiology  Antimicrobials:      Medications  Scheduled Meds: . benzonatate  200 mg Oral TID  . dextromethorphan-guaiFENesin  1 tablet Oral BID  . guaiFENesin  600 mg Oral BID  . hydrocortisone  25 mg Rectal BID  . hydrocortisone cream   Topical TID  . mometasone-formoterol  2 puff Inhalation BID  . multivitamin with minerals  1 tablet Oral Daily  . nystatin  5 mL Mouth/Throat QID  . pantoprazole  40 mg Oral BID AC  . predniSONE  40 mg Oral Q breakfast  .  sodium chloride flush  3 mL Intravenous Q12H  . traMADol  100 mg Oral Q6H   Continuous Infusions: . sodium chloride    . sodium chloride 10 mL/hr at 10/24/17 1447   PRN Meds:.sodium chloride, acetaminophen **OR** acetaminophen, diphenhydrAMINE, hydrALAZINE, levalbuterol, ondansetron **OR** ondansetron (ZOFRAN) IV, sodium chloride flush, traMADol   Antibiotics   Anti-infectives (From admission, onward)   Start     Dose/Rate Route Frequency Ordered Stop   10/24/17 1800  fluconazole (DIFLUCAN) tablet 100 mg     100 mg Oral Daily 10/24/17 1722 10/28/17 0928   10/18/17 1800  levofloxacin (LEVAQUIN) tablet 750 mg  Status:  Discontinued     750 mg Oral Daily 10/18/17 1033 10/18/17 1039   10/18/17 1800  levofloxacin (LEVAQUIN) tablet 500 mg      500 mg Oral Daily 10/18/17 1039 10/21/17 1646   10/17/17 1800  levofloxacin (LEVAQUIN) IVPB 750 mg  Status:  Discontinued     750 mg 100 mL/hr over 90 Minutes Intravenous Every 24 hours 10/17/17 1620 10/18/17 1033        Subjective:   -reports wheezing overnight, none since then -upset about getting mixed messages and hearing the word Discharge  Objective:   Vitals:   10/28/17 0803 10/28/17 2112 10/29/17 0542 10/29/17 0844  BP:  137/79 137/90   Pulse:  87 84   Resp:  17 17   Temp:  (!) 97.5 F (36.4 C) 98.4 F (36.9 C)   TempSrc:  Oral Oral   SpO2: 100% 100% 99% 97%  Weight:   71.8 kg (158 lb 6.4 oz)   Height:        Intake/Output Summary (Last 24 hours) at 10/29/2017 1550 Last data filed at 10/29/2017 1541 Gross per 24 hour  Intake 1203 ml  Output 1650 ml  Net -447 ml     Wt Readings from Last 3 Encounters:  10/29/17 71.8 kg (158 lb 6.4 oz)  10/17/17 74 kg (163 lb 3.2 oz)  09/07/17 77.6 kg (171 lb)     Exam  Gen: Awake, Alert, Oriented X 3, no distress HEENT: PERRLA, Neck supple, no JVD Lungs: good air movement, no wheezes CVS: RRR,No Gallops,Rubs or new Murmurs Abd: soft, Non tender, non distended, BS present Extremities: No Cyanosis, Clubbing or edema Skin: no new rashes Psych: angry, frustrated male   Data Reviewed:  I have personally reviewed following labs and imaging studies  Micro Results Recent Results (from the past 240 hour(s))  Culture, bal-quantitative     Status: None   Collection Time: 10/24/17  3:55 PM  Result Value Ref Range Status   Specimen Description BRONCHIAL ALVEOLAR LAVAGE  Final   Special Requests NONE  Final   Gram Stain   Final    RARE WBC PRESENT, PREDOMINANTLY MONONUCLEAR NO ORGANISMS SEEN    Culture   Final    RARE Consistent with normal respiratory flora. Performed at [facility], [place] [address], [place], [place] [address]    Report Status 10/27/2017 FINAL  Final  Pneumocystis smear by DFA     Status:  None   Collection Time: 10/24/17  3:55 PM  Result Value Ref Range Status   Specimen Source-[facility] BRONCHIAL ALVEOLAR LAVAGE  Final   Pneumocystis jiroveci Ag NEGATIVE  Final    Comment: Performed at [facility] Performed at [facility], [address] [place], [place] [address]     Radiology Reports Dg Chest 2 View  Result Date: 10/17/2017 CLINICAL DATA:  Cough EXAM: CHEST - 2 VIEW COMPARISON:  None. FINDINGS: Lungs are hyperexpanded. There is no edema or consolidation. Heart size and pulmonary vascularity are normal. No adenopathy. No evident bone lesions. IMPRESSION: Lungs hyperexpanded without edema or consolidation. Heart size normal. Electronically Signed   By: Lowella Grip III M.D.   On: 10/17/2017 12:29   Ct Chest W Contrast  Result Date: 10/22/2017 CLINICAL DATA:  Chest pain or shortness of breath. Pleurisy or effusion suspected. Patient reports hemoptysis, dyspnea and wheezing. EXAM: CT CHEST WITH CONTRAST TECHNIQUE: Multidetector CT imaging of the chest was performed during intravenous contrast administration. CONTRAST:  149m OMNIPAQUE IOHEXOL 300 MG/ML  SOLN COMPARISON:  Radiographs yesterday and over the past week. FINDINGS: Cardiovascular: No significant vascular findings. Normal heart size. No pericardial effusion. Mediastinum/Nodes: No enlarged mediastinal or hilar lymph nodes. No mediastinal mass. The esophagus is decompressed. No thyroid nodule. Lungs/Pleura: Minimal dependent atelectasis in both lower lobes. Mild central bronchial thickening. Trachea and mainstem bronchi are otherwise patent. No confluent airspace disease. No pneumothorax or pulmonary mass. No pulmonary edema. No pleural fluid. Upper Abdomen: No acute abnormality. Musculoskeletal: There are no acute or suspicious osseous abnormalities. IMPRESSION: Minimal central bronchial thickening and dependent atelectasis in both lower lobes. No pleural effusion or other explanation for shortness of breath.  Electronically Signed   By: MJeb LeveringM.D.   On: 10/22/2017 23:10   Ct Abdomen Pelvis W Contrast  Result Date: 10/27/2017 CLINICAL DATA:  Burning abdominal pain, asthma EXAM: CT ABDOMEN AND PELVIS WITH CONTRAST TECHNIQUE: Multidetector CT imaging of the abdomen and pelvis was performed using the standard protocol following bolus administration of intravenous contrast. CONTRAST:  1053mOMNIPAQUE IOHEXOL 300 MG/ML  SOLN COMPARISON:  10/22/2017 FINDINGS: Lower chest: Mild hyperinflation of the lungs. No focal pneumonia or airspace process. No pleural abnormality, effusion or pneumothorax. Heart size. Hepatobiliary: No focal liver abnormality is seen. No gallstones, gallbladder wall thickening, or biliary dilatation. Pancreas: Unremarkable. No pancreatic ductal dilatation or surrounding inflammatory changes. Spleen: Normal in size without focal abnormality. Adrenals/Urinary Tract: Adrenal glands are unremarkable. Kidneys are normal, without renal calculi, focal lesion, or hydronephrosis. Bladder is unremarkable. Stomach/Bowel: Negative for bowel obstruction, significant dilatation, ileus, free air. Moderate colonic stool burden. Appendix not visualized. No acute inflammatory process, fluid collection, abscess or hemorrhage. Vascular/Lymphatic: No significant vascular findings are present. No enlarged abdominal or pelvic lymph nodes. Reproductive: Prostate is unremarkable. Other: No abdominal wall hernia or abnormality. No abdominopelvic ascites. Musculoskeletal: No acute or significant osseous findings. IMPRESSION: No acute intra-abdominal or pelvic finding by CT. Electronically Signed   By: M.Jerilynn Mages Shick M.D.   On: 10/27/2017 21:52   Dg Esophagus  Result Date: 10/28/2017 CLINICAL DATA:  Pain and burning in the epigastric region, left upper quadrant pain. EXAM: ESOPHOGRAM / BARIUM SWALLOW / BARIUM TABLET STUDY TECHNIQUE: Combined double contrast and single contrast examination performed using effervescent  crystals, thick barium liquid, and thin barium liquid. The patient was observed with fluoroscopy swallowing a 13 mm barium sulphate tablet. FLUOROSCOPY TIME:  Fluoroscopy Time:  0 minutes 48 seconds Radiation Exposure Index (if provided by the fluoroscopic device): Number of Acquired Spot Images: 0 COMPARISON:  None. FINDINGS: Swallowing mechanism is normal. Normal esophageal motility. No esophageal fold thickening, stricture or obstruction. A 13 mm barium tablet passed into the stomach without difficulty. IMPRESSION: Normal exam. Electronically Signed   By: MeLorin Picket.D.   On: 10/28/2017 14:56   Dg Chest Port 1 View  Result Date: 10/21/2017 CLINICAL DATA:  Shortness of breath, cough EXAM: PORTABLE CHEST 1 VIEW COMPARISON:  10/18/2017 FINDINGS: Heart and mediastinal contours are within normal limits. No focal opacities or effusions. No acute bony abnormality. IMPRESSION: No active disease. Electronically Signed   By: Rolm Baptise M.D.   On: 10/21/2017 07:52   Dg Chest Port 1 View  Result Date: 10/18/2017 CLINICAL DATA:  32 year old male with asthma exacerbation EXAM: PORTABLE CHEST 1 VIEW COMPARISON:  Prior chest x-ray 10/17/2017 FINDINGS: The lungs are clear and negative for focal airspace consolidation, pulmonary edema or suspicious pulmonary nodule. The degree of pulmonary hyperinflation is similar compared to prior. No pleural effusion or pneumothorax. Cardiac and mediastinal contours are within normal limits. No acute fracture or lytic or blastic osseous lesions. The visualized upper abdominal bowel gas pattern is unremarkable. IMPRESSION: 1. No acute cardiopulmonary process. 2. Similar degree of pulmonary hyperinflation. Electronically Signed   By: Jacqulynn Cadet M.D.   On: 10/18/2017 12:34   Dg Chest Portable 1 View  Result Date: 10/17/2017 CLINICAL DATA:  Shortness of breath today. EXAM: PORTABLE CHEST 1 VIEW COMPARISON:  PA and lateral chest 10/17/2017. FINDINGS: The chest is  hyperexpanded but the lungs are clear. No pneumothorax or pleural effusion. Heart size is normal. No acute bony abnormality. IMPRESSION: Pulmonary hyperexpansion.  Lungs are clear. Electronically Signed   By: Inge Rise M.D.   On: 10/17/2017 15:38    Lab Data:  CBC: No results for input(s): WBC, NEUTROABS, HGB, HCT, MCV, PLT in the last 168 hours. Basic Metabolic Panel: Recent Labs  Lab 10/23/17 0352 10/24/17 0648 10/25/17 0643  NA 134* 135 134*  K 4.6 4.6 4.7  CL 96* 96* 94*  CO2 31 29 32  GLUCOSE 122* 114* 113*  BUN 21* 20 18  CREATININE 0.85 0.88 0.87  CALCIUM 9.7 9.5 9.5   GFR: Estimated Creatinine Clearance: 122.8 mL/min (by C-G formula based on SCr of 0.87 mg/dL). Liver Function Tests: No results for input(s): AST, ALT, ALKPHOS, BILITOT, PROT, ALBUMIN in the last 168 hours. No results for input(s): LIPASE, AMYLASE in the last 168 hours. No results for input(s): AMMONIA in the last 168 hours. Coagulation Profile: No results for input(s): INR, PROTIME in the last 168 hours. Cardiac Enzymes: No results for input(s): CKTOTAL, CKMB, CKMBINDEX, TROPONINI in the last 168 hours. BNP (last 3 results) No results for input(s): PROBNP in the last 8760 hours. HbA1C: No results for input(s): HGBA1C in the last 72 hours. CBG: Recent Labs  Lab 10/24/17 1642 10/25/17 0735 10/26/17 0811 10/27/17 0750 10/29/17 0731  GLUCAP 118* 124* 120* 106* 95   Lipid Profile: No results for input(s): CHOL, HDL, LDLCALC, TRIG, CHOLHDL, LDLDIRECT in the last 72 hours. Thyroid Function Tests: No results for input(s): TSH, T4TOTAL, FREET4, T3FREE, THYROIDAB in the last 72 hours. Anemia Panel: No results for input(s): VITAMINB12, FOLATE, FERRITIN, TIBC, IRON, RETICCTPCT in the last 72 hours. Urine analysis:    Component Value Date/Time   COLORURINE STRAW (A) 10/18/2017 0751   APPEARANCEUR CLEAR 10/18/2017 0751   LABSPEC 1.006 10/18/2017 0751   PHURINE 6.0 10/18/2017 0751   GLUCOSEU  50 (A) 10/18/2017 0751   HGBUR NEGATIVE 10/18/2017 0751   BILIRUBINUR NEGATIVE 10/18/2017 0751   KETONESUR NEGATIVE 10/18/2017 0751   PROTEINUR NEGATIVE 10/18/2017 0751   NITRITE NEGATIVE 10/18/2017 0751   LEUKOCYTESUR NEGATIVE 10/18/2017 0751     Domenic Polite M.D. [facility] 10/29/2017, 3:50 PM  Page via [email or web address] - password [email or web address]  Call night coverage person covering after 7pm

## 2017-10-30 ENCOUNTER — Other Ambulatory Visit: Payer: Self-pay | Admitting: Pulmonary Disease

## 2017-10-30 ENCOUNTER — Inpatient Hospital Stay (HOSPITAL_COMMUNITY): Payer: Self-pay

## 2017-10-30 DIAGNOSIS — R059 Cough, unspecified: Secondary | ICD-10-CM

## 2017-10-30 DIAGNOSIS — R05 Cough: Secondary | ICD-10-CM

## 2017-10-30 LAB — PULMONARY FUNCTION TEST
DL/VA % PRED: 97 %
DL/VA: 4.61 ml/min/mmHg/L
DLCO COR % PRED: 101 %
DLCO COR: 34.14 ml/min/mmHg
DLCO UNC % PRED: 104 %
DLCO unc: 35.24 ml/min/mmHg
FEF 25-75 PRE: 2.03 L/s
FEF2575-%Pred-Pre: 49 %
FEV1-%Pred-Pre: 61 %
FEV1-Pre: 2.38 L
FEV1FVC-%PRED-PRE: 49 %
FEV6-%Pred-Pre: 126 %
FEV6-PRE: 5.81 L
FEV6FVC-%PRED-PRE: 102 %
FVC-%PRED-PRE: 124 %
FVC-PRE: 5.81 L
PRE FEV6/FVC RATIO: 100 %
Pre FEV1/FVC ratio: 41 %
RV % pred: 360 %
RV: 6.31 L
TLC % pred: 166 %
TLC: 11.87 L

## 2017-10-30 LAB — GLUCOSE, CAPILLARY: GLUCOSE-CAPILLARY: 81 mg/dL (ref 65–99)

## 2017-10-30 MED ORDER — NYSTATIN 100000 UNIT/ML MT SUSP: 5.0000 mL | Freq: Four times a day (QID) | OROMUCOSAL | 0 refills | Status: DC

## 2017-10-30 MED ORDER — FLUCONAZOLE 100 MG PO TABS: 100.0000 mg | ORAL_TABLET | Freq: Every day | ORAL | 0 refills | Status: DC

## 2017-10-30 MED ORDER — FAMOTIDINE 20 MG PO TABS: 20.0000 mg | ORAL_TABLET | Freq: Two times a day (BID) | ORAL | 0 refills | Status: DC

## 2017-10-30 MED ORDER — PANTOPRAZOLE SODIUM 40 MG PO TBEC: 40.0000 mg | DELAYED_RELEASE_TABLET | Freq: Two times a day (BID) | ORAL | 0 refills | Status: DC

## 2017-10-30 MED ORDER — TRAMADOL HCL 50 MG PO TABS: 50.0000 mg | ORAL_TABLET | Freq: Two times a day (BID) | ORAL | 0 refills | Status: DC | PRN

## 2017-10-30 MED ORDER — MONTELUKAST SODIUM 10 MG PO TABS: 10.0000 mg | ORAL_TABLET | Freq: Every day | ORAL | 0 refills | Status: DC

## 2017-10-30 MED ORDER — MOMETASONE FURO-FORMOTEROL FUM 200-5 MCG/ACT IN AERO: 2.0000 | INHALATION_SPRAY | Freq: Two times a day (BID) | RESPIRATORY_TRACT | 0 refills | Status: DC

## 2017-10-30 MED ORDER — MONTELUKAST SODIUM 10 MG PO TABS: 10.0000 mg | ORAL_TABLET | Freq: Every day | ORAL | Status: DC

## 2017-10-30 MED ORDER — PREDNISONE 10 MG PO TABS: 10.0000 mg | ORAL_TABLET | Freq: Every day | ORAL | 1 refills | Status: DC

## 2017-10-30 MED ORDER — ACETAMINOPHEN 325 MG PO TABS: 650.0000 mg | ORAL_TABLET | Freq: Four times a day (QID) | ORAL | Status: DC | PRN

## 2017-10-30 NOTE — Progress Notes (Signed)
Pt discharged from unit with all d/c instructions reviewed. All personal belongings with pt. Pt in no distress at time of d/c. Accompanied by wife.

## 2017-10-30 NOTE — Progress Notes (Addendum)
Name: Shannon Knapp MRN: [id number] DOB: Mar 22, 1986    ADMISSION DATE:  10/17/2017 CONSULTATION DATE:  10/17/2017  REFERRING MD :  Dr. Denton Lank   CHIEF COMPLAINT:  Dyspnea  brief 32 year old male with PMH of  Asthma, Past smoker (reportley quiet March 2019). Recent Visit to ED on 4/12 with purulent rhinorrhea, given doxycycline and 4/22 for cough/shortness of breath given steroid taper and nebs   5/31 Presented to Pulmonary Office (Dr. Isaiah Serge) with reported 3 days of dyspnea, white mucus production, and wheezing with noted hemoptysis. feno 95 . Given Severity of dyspnea and wheezing sent to ED for further work-up. Given 125 mg solu-medrol, magnesium 2 mg, albuterol 10 mg and 20 mg, in addition to Atrovent with minimal improvement. PCCM asked to consult.    He has had slow improvement over the last 2 weeks.  No wheeze noted today.  SIGNIFICANT EVENTS  5/31 > Presents to ED from office as status asthmaticus  10/24/2017 fiberoptic bronchoscopy with thrush noted on posterior pharynx  CXR 5/31 > The chest is hyperexpanded but the lungs are clear. No pneumothorax or pleural effusion. Heart size is normal. No acute bony Abnormality.  10/28/2017 swallow negative  CXR 6/4> Heart and mediastinal contours are within normal limits. No focal opacities or effusions.   SUBJECTIVE/OVERNIGHT/INTERVAL HX No acute events overnight.  No new complaints today  VITAL SIGNS: Temp:  [98.2 F (36.8 C)-98.5 F (36.9 C)] 98.2 F (36.8 C) (06/13 0514) Pulse Rate:  [89-98] 98 (06/12 1929) Resp:  [17-20] 20 (06/13 0514) BP: (124-147)/(69-98) 124/73 (06/13 0514) SpO2:  [99 %-100 %] 100 % (06/13 0756) Weight:  [158 lb 14.4 oz (72.1 kg)] 158 lb 14.4 oz (72.1 kg) (06/13 0514) Room air PHYSICAL EXAMINATION: Blood pressure 124/73, pulse 98, temperature 98.2 F (36.8 C), temperature source Oral, resp. rate 20, height 5\' 11"  (1.803 m), weight 158 lb 14.4 oz (72.1 kg), SpO2 100 %. Gen:      No acute  distress HEENT:  EOMI, sclera anicteric Neck:     No masses; no thyromegaly Lungs:    Clear to auscultation bilaterally; normal respiratory effort CV:         Regular rate and rhythm; no murmurs Abd:      + bowel sounds; soft, non-tender; no palpable masses, no distension Ext:    No edema; adequate peripheral perfusion Skin:      Warm and dry; no rash Neuro: alert and oriented x 3 Psych: normal mood and affect  PULMONARY No results for input(s): PHART, PCO2ART, PO2ART, HCO3, TCO2, O2SAT in the last 168 hours.  Invalid input(s): PCO2, PO2 CBC No results for input(s): HGB, HCT, WBC, PLT in the last 168 hours. COAGULATION No results for input(s): INR in the last 168 hours.  CARDIAC  No results for input(s): TROPONINI in the last 168 hours. No results for input(s): PROBNP in the last 168 hours.  CHEMISTRY Recent Labs  Lab 10/24/17 0648 10/25/17 0643  NA 135 134*  K 4.6 4.7  CL 96* 94*  CO2 29 32  GLUCOSE 114* 113*  BUN 20 18  CREATININE 0.88 0.87  CALCIUM 9.5 9.5   Estimated Creatinine Clearance: 123.2 mL/min (by C-G formula based on SCr of 0.87 mg/dL).  LIVER No results for input(s): AST, ALT, ALKPHOS, BILITOT, PROT, ALBUMIN, INR in the last 168 hours. INFECTIOUS No results for input(s): LATICACIDVEN, PROCALCITON in the last 168 hours. ENDOCRINE CBG (last 3)  Recent Labs    10/29/17 0731 10/30/17 8295  GLUCAP 95 81    ASSESSMENT / PLAN: Status asthmaticus Thrush  Cough Hypoxemia  Hemoptysis (resolved) Allergies Sinus tachycardia  Syncope  Discussion 32 year old with history of asthma admitted with status asthmaticus, cough, hemoptysis. Bronchoscopy on 3/7 was not revealing except for mild thrush  Presentation is consistent with asthma given elevated FENO in office, IgE (2209). He does have mild elevation in peripheral eos but CBC was done he had been already started on steroids. PFTs reviewed with moderate obstruction and air trapping, normal diffusion  capacity.  Unable to assess bronchodilator response as patient refused albuterol.  He has a diagnosis of COPD in the chart but airway obstruction is unlikely to be from COPD as there is no evidence of emphysema on CT scan and diffusion capacity, A1AT levels are normal.   Assessment /plan:  Continue prednisone can start taper by 10 mg every 4 days Currently on Diflucan and nystatin with stop date PPI and H2 blocker Continue Dulera.  He needs to be discharged on this inhaler. Emphasized that he needs to be on controller medication without interruption.  Start Singulair 10 mg qd for allergies.   He will need revaluation as outpatient when he is off the steroids with repeat CBC, blood allergy profile. Currently scheduled in pulmonary clinic for 6/26.  We have rescheduled it for 7/16 so that we can do the tests after he is off the steroids.   Discussed with patient and his wife.  PCCM will be available as needed for this admission.  Chilton GreathousePraveen Jessic Standifer MD [facility] 10/30/2017, 11:44 AM

## 2017-10-30 NOTE — Discharge Summary (Signed)
Physician Discharge Summary  Shannon Knapp YHC:[id number] DOB: 08-Feb-1986 DOA: 10/17/2017  PCP: Patient, No Pcp Per  Admit date: 10/17/2017 Discharge date: 10/30/2017  Time spent: 45 minutes  Recommendations for Outpatient Follow-up:  1. [facility] 7/16 at 1pm for PFTs and then 3:30pm for MD Follow up with Dr.Praveen Mannam 2. Outpatient Event monitor [facility] to arrange   Discharge Diagnoses:  Principal Problem:   Severe persistent acute asthmatic bronchitis Active Problems:   Elevated blood pressure reading without diagnosis of hypertension   Respiratory distress   Status asthmaticus   Protein-calorie malnutrition, severe   Acute respiratory failure with hypoxemia (HCC)   Acute respiratory distress   Asthma exacerbation   Cough   Hemoptysis   Allergic rhinitis   Hypoxemia   Syncope   Upper airway cough syndrome   Discharge Condition: improved  Diet recommendation: regular  Filed Weights   10/28/17 0500 10/29/17 0542 10/30/17 0514  Weight: 70.5 kg (155 lb 8 oz) 71.8 kg (158 lb 6.4 oz) 72.1 kg (158 lb 14.4 oz)    History of present illness:  32 year old male with history of asthma, past smoker, quit March 2019, presented to pulmonary office for follow-up with Dr. Vaughan Browner, had reported 3 days of dyspnea, wheezing with coughing, mucus phlegm and hemoptysis.  Patient was sent to ED for further work-up    Hospital Course:   Severe persistent acute asthmatic bronchitis - patient has family history of asthma with father, uncle and now his son, did have asthma as a child. - presented with 3 days of dyspnea, wheezing with coughing, mucus phlegm and hemoptysis  - on workup Alpha1AT was negative, HIV negative, IgE -VERY HIGH 2200 - chest x-ray shows no infiltrates, sputum culture showed normal flora.  Respiratory virus panel negative -2D echo showed EF 65 to 70% with normal diastolic function, no regional wall motion abnormality. -CT chest showed minimal  central bronchial thickening and dependent atelectasis in the both lower lobe -underwent bronchoscopy on 6/7 due to hemoptysis, which showed evidence of thrush otherwise no abnormalities. -clinically disgnosed with asthma, underwent PFTs inpatient when symptoms improved this noted moderate obstruction and air trapping, normal diffusion capacity -at this time clinically felt to be stable for discharge on slow prednisone  Taper, Diflucan and nystatin to complete course, PPI/ H2 blocker and Dulera -FU arranged with [facility] Dr.Mannam next Month with repeat PFTs  Left-sided abdominal pain: Likely due to coughing, CT abdomen did not show any acute abdominal pathology.  Continue PPI  Oral thrush  -likely from ICS/LABA prior to admission, steroids Started on fluconazole x 7 days, needs 3-28moe days of this at discharge  Syncope/near syncope - Patient had reported 2 syncopal events, cardiology was consulted, recommended outpatient event monitor -no arrhythmias noted, ECHo normal    Hemoptysis -Likely due to acute tracheobronchitis from #1 -resolved  Sinus tachycardia -Resolved    Procedures: Procedures:  2D echo EF 65 to 70%, normal motion abnormalities  Consultants:   Pulmonary Cardiology    Discharge Exam: Vitals:   10/30/17 0514 10/30/17 0756  BP: 124/73   Pulse:    Resp: 20   Temp: 98.2 F (36.8 C)   SpO2:  100%    General: AAOx3 Cardiovascular: S1S2/RRR Respiratory: CTAB, no wheezes  Discharge Instructions   Discharge Instructions    Diet general   Complete by:  As directed    Increase activity slowly   Complete by:  As directed      Allergies as of 10/30/2017  Reactions   Amoxicillin Hives   Has patient had a PCN reaction causing immediate rash, facial/tongue/throat swelling, SOB or lightheadedness with hypotension: Yes Has patient had a PCN reaction causing severe rash involving mucus membranes or skin necrosis: No Has patient had a PCN  reaction that required hospitalization: No Has patient had a PCN reaction occurring within the last 10 years: Yes If all of the above answers are "NO", then may proceed with Cephalosporin use.   Fish Allergy Swelling   Shellfish Allergy Swelling   Penicillins Hives   Has patient had a PCN reaction causing immediate rash, facial/tongue/throat swelling, SOB or lightheadedness with hypotension: Yes Has patient had a PCN reaction causing severe rash involving mucus membranes or skin necrosis: No Has patient had a PCN reaction that required hospitalization: No Has patient had a PCN reaction occurring within the last 10 years: Yes If all of the above answers are "NO", then may proceed with Cephalosporin use.      Medication List    STOP taking these medications   albuterol (2.5 MG/3ML) 0.083% nebulizer solution Commonly known as:  PROVENTIL   BC HEADACHE POWDER PO   fluticasone 50 MCG/ACT nasal spray Commonly known as:  FLONASE   fluticasone furoate-vilanterol 200-25 MCG/INH Aepb Commonly known as:  BREO ELLIPTA   loratadine 10 MG tablet Commonly known as:  CLARITIN     TAKE these medications   acetaminophen 325 MG tablet Commonly known as:  TYLENOL Take 2 tablets (650 mg total) by mouth every 6 (six) hours as needed for mild pain (or Fever >/= 101).   famotidine 20 MG tablet Commonly known as:  PEPCID Take 1 tablet (20 mg total) by mouth 2 (two) times daily.   fluconazole 100 MG tablet Commonly known as:  DIFLUCAN Take 1 tablet (100 mg total) by mouth daily. For 4days   mometasone-formoterol 200-5 MCG/ACT Aero Commonly known as:  DULERA Inhale 2 puffs into the lungs 2 (two) times daily.   montelukast 10 MG tablet Commonly known as:  SINGULAIR Take 1 tablet (10 mg total) by mouth at bedtime.   nystatin 100000 UNIT/ML suspension Commonly known as:  MYCOSTATIN Use as directed 5 mLs (500,000 Units total) in the mouth or throat 4 (four) times daily. For 4days    pantoprazole 40 MG tablet Commonly known as:  PROTONIX Take 1 tablet (40 mg total) by mouth 2 (two) times daily before a meal.   predniSONE 10 MG tablet Commonly known as:  DELTASONE Take 1-4 tablets (10-40 mg total) by mouth daily with breakfast. Take 72m daily x 4days then 358mdaily x 4days then 2071maily x 4days then 45m27mily x 4days then STOP Start taking on:  10/31/2017 What changed:    how much to take  how to take this  when to take this  additional instructions   traMADol 50 MG tablet Commonly known as:  ULTRAM Take 1 tablet (50 mg total) by mouth every 12 (twelve) hours as needed for moderate pain.      Allergies  Allergen Reactions  . Amoxicillin Hives    Has patient had a PCN reaction causing immediate rash, facial/tongue/throat swelling, SOB or lightheadedness with hypotension: Yes Has patient had a PCN reaction causing severe rash involving mucus membranes or skin necrosis: No Has patient had a PCN reaction that required hospitalization: No Has patient had a PCN reaction occurring within the last 10 years: Yes If all of the above answers are "NO", then may proceed with Cephalosporin  use.  . Fish Allergy Swelling  . Shellfish Allergy Swelling  . Penicillins Hives    Has patient had a PCN reaction causing immediate rash, facial/tongue/throat swelling, SOB or lightheadedness with hypotension: Yes Has patient had a PCN reaction causing severe rash involving mucus membranes or skin necrosis: No Has patient had a PCN reaction that required hospitalization: No Has patient had a PCN reaction occurring within the last 10 years: Yes If all of the above answers are "NO", then may proceed with Cephalosporin use.   Follow-up Information    [facility]   Contact information: [place] [address] [phone]       Marshell Garfinkel, MD Follow up on 12/02/2017.   Specialty:  Pulmonary Disease Why:   at 1pm for Pulmonary function tests and then 3:30pm for Follow up appointment with Dr.Mannam Contact information: [address] [place] [address] [phone]            The results of significant diagnostics from this hospitalization (including imaging, microbiology, ancillary and laboratory) are listed below for reference.    Significant Diagnostic Studies: Dg Chest 2 View  Result Date: 10/17/2017 CLINICAL DATA:  Cough EXAM: CHEST - 2 VIEW COMPARISON:  None. FINDINGS: Lungs are hyperexpanded. There is no edema or consolidation. Heart size and pulmonary vascularity are normal. No adenopathy. No evident bone lesions. IMPRESSION: Lungs hyperexpanded without edema or consolidation. Heart size normal. Electronically Signed   By: Lowella Grip III M.D.   On: 10/17/2017 12:29   Ct Chest W Contrast  Result Date: 10/22/2017 CLINICAL DATA:  Chest pain or shortness of breath. Pleurisy or effusion suspected. Patient reports hemoptysis, dyspnea and wheezing. EXAM: CT CHEST WITH CONTRAST TECHNIQUE: Multidetector CT imaging of the chest was performed during intravenous contrast administration. CONTRAST:  172m OMNIPAQUE IOHEXOL 300 MG/ML  SOLN COMPARISON:  Radiographs yesterday and over the past week. FINDINGS: Cardiovascular: No significant vascular findings. Normal heart size. No pericardial effusion. Mediastinum/Nodes: No enlarged mediastinal or hilar lymph nodes. No mediastinal mass. The esophagus is decompressed. No thyroid nodule. Lungs/Pleura: Minimal dependent atelectasis in both lower lobes. Mild central bronchial thickening. Trachea and mainstem bronchi are otherwise patent. No confluent airspace disease. No pneumothorax or pulmonary mass. No pulmonary edema. No pleural fluid. Upper Abdomen: No acute abnormality. Musculoskeletal: There are no acute or suspicious osseous abnormalities. IMPRESSION: Minimal central bronchial thickening and dependent atelectasis in both lower lobes.  No pleural effusion or other explanation for shortness of breath. Electronically Signed   By: MJeb LeveringM.D.   On: 10/22/2017 23:10   Ct Abdomen Pelvis W Contrast  Result Date: 10/27/2017 CLINICAL DATA:  Burning abdominal pain, asthma EXAM: CT ABDOMEN AND PELVIS WITH CONTRAST TECHNIQUE: Multidetector CT imaging of the abdomen and pelvis was performed using the standard protocol following bolus administration of intravenous contrast. CONTRAST:  1042mOMNIPAQUE IOHEXOL 300 MG/ML  SOLN COMPARISON:  10/22/2017 FINDINGS: Lower chest: Mild hyperinflation of the lungs. No focal pneumonia or airspace process. No pleural abnormality, effusion or pneumothorax. Heart size. Hepatobiliary: No focal liver abnormality is seen. No gallstones, gallbladder wall thickening, or biliary dilatation. Pancreas: Unremarkable. No pancreatic ductal dilatation or surrounding inflammatory changes. Spleen: Normal in size without focal abnormality. Adrenals/Urinary Tract: Adrenal glands are unremarkable. Kidneys are normal, without renal calculi, focal lesion, or hydronephrosis. Bladder is unremarkable. Stomach/Bowel: Negative for bowel obstruction, significant dilatation, ileus, free air. Moderate colonic stool burden. Appendix not visualized. No acute inflammatory process, fluid  collection, abscess or hemorrhage. Vascular/Lymphatic: No significant vascular findings are present. No enlarged abdominal or pelvic lymph nodes. Reproductive: Prostate is unremarkable. Other: No abdominal wall hernia or abnormality. No abdominopelvic ascites. Musculoskeletal: No acute or significant osseous findings. IMPRESSION: No acute intra-abdominal or pelvic finding by CT. Electronically Signed   By: Jerilynn Mages.  Shick M.D.   On: 10/27/2017 21:52   Dg Esophagus  Result Date: 10/28/2017 CLINICAL DATA:  Pain and burning in the epigastric region, left upper quadrant pain. EXAM: ESOPHOGRAM / BARIUM SWALLOW / BARIUM TABLET STUDY TECHNIQUE: Combined double  contrast and single contrast examination performed using effervescent crystals, thick barium liquid, and thin barium liquid. The patient was observed with fluoroscopy swallowing a 13 mm barium sulphate tablet. FLUOROSCOPY TIME:  Fluoroscopy Time:  0 minutes 48 seconds Radiation Exposure Index (if provided by the fluoroscopic device): Number of Acquired Spot Images: 0 COMPARISON:  None. FINDINGS: Swallowing mechanism is normal. Normal esophageal motility. No esophageal fold thickening, stricture or obstruction. A 13 mm barium tablet passed into the stomach without difficulty. IMPRESSION: Normal exam. Electronically Signed   By: Lorin Picket M.D.   On: 10/28/2017 14:56   Dg Chest Port 1 View  Result Date: 10/21/2017 CLINICAL DATA:  Shortness of breath, cough EXAM: PORTABLE CHEST 1 VIEW COMPARISON:  10/18/2017 FINDINGS: Heart and mediastinal contours are within normal limits. No focal opacities or effusions. No acute bony abnormality. IMPRESSION: No active disease. Electronically Signed   By: Rolm Baptise M.D.   On: 10/21/2017 07:52   Dg Chest Port 1 View  Result Date: 10/18/2017 CLINICAL DATA:  32 year old male with asthma exacerbation EXAM: PORTABLE CHEST 1 VIEW COMPARISON:  Prior chest x-ray 10/17/2017 FINDINGS: The lungs are clear and negative for focal airspace consolidation, pulmonary edema or suspicious pulmonary nodule. The degree of pulmonary hyperinflation is similar compared to prior. No pleural effusion or pneumothorax. Cardiac and mediastinal contours are within normal limits. No acute fracture or lytic or blastic osseous lesions. The visualized upper abdominal bowel gas pattern is unremarkable. IMPRESSION: 1. No acute cardiopulmonary process. 2. Similar degree of pulmonary hyperinflation. Electronically Signed   By: Jacqulynn Cadet M.D.   On: 10/18/2017 12:34   Dg Chest Portable 1 View  Result Date: 10/17/2017 CLINICAL DATA:  Shortness of breath today. EXAM: PORTABLE CHEST 1 VIEW  COMPARISON:  PA and lateral chest 10/17/2017. FINDINGS: The chest is hyperexpanded but the lungs are clear. No pneumothorax or pleural effusion. Heart size is normal. No acute bony abnormality. IMPRESSION: Pulmonary hyperexpansion.  Lungs are clear. Electronically Signed   By: Inge Rise M.D.   On: 10/17/2017 15:38    Microbiology: Recent Results (from the past 240 hour(s))  Culture, bal-quantitative     Status: None   Collection Time: 10/24/17  3:55 PM  Result Value Ref Range Status   Specimen Description BRONCHIAL ALVEOLAR LAVAGE  Final   Special Requests NONE  Final   Gram Stain   Final    RARE WBC PRESENT, PREDOMINANTLY MONONUCLEAR NO ORGANISMS SEEN    Culture   Final    RARE Consistent with normal respiratory flora. Performed at [facility], [place] [address], [place], [place] [address]    Report Status 10/27/2017 FINAL  Final  Pneumocystis smear by DFA     Status: None   Collection Time: 10/24/17  3:55 PM  Result Value Ref Range Status   Specimen Source-[facility] BRONCHIAL ALVEOLAR LAVAGE  Final   Pneumocystis jiroveci Ag NEGATIVE  Final    Comment:  Performed at [facility] Performed at [facility], [place] [address] [place], [place] [address]      Labs: Basic Metabolic Panel: Recent Labs  Lab 10/24/17 0648 10/25/17 0643  NA 135 134*  K 4.6 4.7  CL 96* 94*  CO2 29 32  GLUCOSE 114* 113*  BUN 20 18  CREATININE 0.88 0.87  CALCIUM 9.5 9.5   Liver Function Tests: No results for input(s): AST, ALT, ALKPHOS, BILITOT, PROT, ALBUMIN in the last 168 hours. No results for input(s): LIPASE, AMYLASE in the last 168 hours. No results for input(s): AMMONIA in the last 168 hours. CBC: No results for input(s): WBC, NEUTROABS, HGB, HCT, MCV, PLT in the last 168 hours. Cardiac Enzymes: No results for input(s): CKTOTAL, CKMB, CKMBINDEX, TROPONINI in the last 168 hours. BNP: BNP (last 3 results) No results for input(s): BNP in the last 8760 hours.  ProBNP  (last 3 results) No results for input(s): PROBNP in the last 8760 hours.  CBG: Recent Labs  Lab 10/25/17 0735 10/26/17 0811 10/27/17 0750 10/29/17 0731 10/30/17 0717  GLUCAP 124* 120* 106* 95 81       Signed:  Domenic Polite MD.  [facility] 10/30/2017, 4:18 PM

## 2017-10-30 NOTE — Plan of Care (Signed)
  Problem: Coping: Goal: Level of anxiety will decrease Outcome: Not Applicable

## 2017-11-12 ENCOUNTER — Ambulatory Visit: Payer: Self-pay | Admitting: Pulmonary Disease

## 2017-11-28 ENCOUNTER — Encounter (HOSPITAL_COMMUNITY): Payer: Self-pay | Admitting: *Deleted

## 2017-11-28 ENCOUNTER — Inpatient Hospital Stay (HOSPITAL_COMMUNITY)
Admission: EM | Admit: 2017-11-28 | Discharge: 2017-11-30 | DRG: 203 | Disposition: A | Discharge status: Home / Self Care | Payer: Self-pay | Attending: Family Medicine | Admitting: Family Medicine

## 2017-11-28 ENCOUNTER — Emergency Department (HOSPITAL_COMMUNITY): Payer: Self-pay

## 2017-11-28 ENCOUNTER — Other Ambulatory Visit: Payer: Self-pay

## 2017-11-28 DIAGNOSIS — Z7951 Long term (current) use of inhaled steroids: Secondary | ICD-10-CM

## 2017-11-28 DIAGNOSIS — J45901 Unspecified asthma with (acute) exacerbation: Secondary | ICD-10-CM | POA: Diagnosis present

## 2017-11-28 DIAGNOSIS — Z880 Allergy status to penicillin: Secondary | ICD-10-CM

## 2017-11-28 DIAGNOSIS — J4541 Moderate persistent asthma with (acute) exacerbation: Principal | ICD-10-CM | POA: Diagnosis present

## 2017-11-28 DIAGNOSIS — Z91013 Allergy to seafood: Secondary | ICD-10-CM

## 2017-11-28 DIAGNOSIS — Z87891 Personal history of nicotine dependence: Secondary | ICD-10-CM

## 2017-11-28 DIAGNOSIS — J449 Chronic obstructive pulmonary disease, unspecified: Secondary | ICD-10-CM | POA: Diagnosis present

## 2017-11-28 DIAGNOSIS — Z825 Family history of asthma and other chronic lower respiratory diseases: Secondary | ICD-10-CM

## 2017-11-28 LAB — BASIC METABOLIC PANEL
ANION GAP: 9 (ref 5–15)
BUN: <5 mg/dL — ABNORMAL LOW (ref 6–20)
CALCIUM: 9.5 mg/dL (ref 8.9–10.3)
CO2: 26 mmol/L (ref 22–32)
Chloride: 108 mmol/L (ref 98–111)
Creatinine, Ser: 0.85 mg/dL (ref 0.61–1.24)
GLUCOSE: 122 mg/dL — AB (ref 70–99)
Potassium: 3.7 mmol/L (ref 3.5–5.1)
Sodium: 143 mmol/L (ref 135–145)

## 2017-11-28 LAB — CBC WITH DIFFERENTIAL/PLATELET
ABS IMMATURE GRANULOCYTES: 0.1 10*3/uL (ref 0.0–0.1)
BASOS ABS: 0.1 10*3/uL (ref 0.0–0.1)
BASOS PCT: 1 %
Eosinophils Absolute: 1.4 10*3/uL — ABNORMAL HIGH (ref 0.0–0.7)
Eosinophils Relative: 14 %
HCT: 44.3 % (ref 39.0–52.0)
HEMOGLOBIN: 15.2 g/dL (ref 13.0–17.0)
IMMATURE GRANULOCYTES: 1 %
LYMPHS PCT: 34 %
Lymphs Abs: 3.4 10*3/uL (ref 0.7–4.0)
MCH: 30.6 pg (ref 26.0–34.0)
MCHC: 34.3 g/dL (ref 30.0–36.0)
MCV: 89.3 fL (ref 78.0–100.0)
MONOS PCT: 9 %
Monocytes Absolute: 0.9 10*3/uL (ref 0.1–1.0)
NEUTROS ABS: 4.2 10*3/uL (ref 1.7–7.7)
NEUTROS PCT: 41 %
PLATELETS: 319 10*3/uL (ref 150–400)
RBC: 4.96 MIL/uL (ref 4.22–5.81)
RDW: 12.9 % (ref 11.5–15.5)
WBC: 10.1 10*3/uL (ref 4.0–10.5)

## 2017-11-28 LAB — I-STAT TROPONIN, ED: Troponin i, poc: 0 ng/mL (ref 0.00–0.08)

## 2017-11-28 MED ORDER — LEVALBUTEROL HCL 1.25 MG/0.5ML IN NEBU
1.2500 mg | INHALATION_SOLUTION | Freq: Once | RESPIRATORY_TRACT | Status: AC
  Administered 2017-11-28: 1.25 mg via RESPIRATORY_TRACT
  Filled 2017-11-28: qty 0.5

## 2017-11-28 MED ORDER — MOMETASONE FURO-FORMOTEROL FUM 200-5 MCG/ACT IN AERO
2.0000 | INHALATION_SPRAY | Freq: Two times a day (BID) | RESPIRATORY_TRACT | Status: DC
  Administered 2017-11-29 – 2017-11-30 (×3): 2 via RESPIRATORY_TRACT
  Filled 2017-11-28: qty 8.8

## 2017-11-28 MED ORDER — IPRATROPIUM-ALBUTEROL 0.5-2.5 (3) MG/3ML IN SOLN
3.0000 mL | Freq: Four times a day (QID) | RESPIRATORY_TRACT | Status: DC
  Administered 2017-11-29 – 2017-11-30 (×6): 3 mL via RESPIRATORY_TRACT
  Filled 2017-11-28 (×6): qty 3

## 2017-11-28 MED ORDER — METHYLPREDNISOLONE SODIUM SUCC 125 MG IJ SOLR
125.0000 mg | Freq: Once | INTRAMUSCULAR | Status: AC
  Administered 2017-11-28: 125 mg via INTRAVENOUS
  Filled 2017-11-28: qty 2

## 2017-11-28 MED ORDER — LEVALBUTEROL HCL 1.25 MG/0.5ML IN NEBU
1.2500 mg | INHALATION_SOLUTION | Freq: Once | RESPIRATORY_TRACT | Status: DC
  Filled 2017-11-28: qty 0.5

## 2017-11-28 MED ORDER — ENOXAPARIN SODIUM 40 MG/0.4ML ~~LOC~~ SOLN
40.0000 mg | SUBCUTANEOUS | Status: DC
  Administered 2017-11-29 – 2017-11-30 (×2): 40 mg via SUBCUTANEOUS
  Filled 2017-11-28 (×2): qty 0.4

## 2017-11-28 MED ORDER — IPRATROPIUM BROMIDE 0.02 % IN SOLN
0.5000 mg | Freq: Once | RESPIRATORY_TRACT | Status: AC
  Administered 2017-11-28: 0.5 mg via RESPIRATORY_TRACT
  Filled 2017-11-28: qty 2.5

## 2017-11-28 MED ORDER — SODIUM CHLORIDE 0.9% FLUSH
3.0000 mL | Freq: Two times a day (BID) | INTRAVENOUS | Status: DC
  Administered 2017-11-29 – 2017-11-30 (×2): 3 mL via INTRAVENOUS

## 2017-11-28 MED ORDER — SODIUM CHLORIDE 0.9 % IV SOLN: 250.0000 mL | INTRAVENOUS | Status: DC | PRN

## 2017-11-28 MED ORDER — PREDNISONE 50 MG PO TABS
60.0000 mg | ORAL_TABLET | Freq: Every day | ORAL | Status: DC
  Administered 2017-11-29: 60 mg via ORAL
  Filled 2017-11-28: qty 1

## 2017-11-28 MED ORDER — SODIUM CHLORIDE 0.9% FLUSH: 3.0000 mL | INTRAVENOUS | Status: DC | PRN

## 2017-11-28 MED ORDER — METHYLPREDNISOLONE SODIUM SUCC 125 MG IJ SOLR: 125.0000 mg | Freq: Once | INTRAMUSCULAR | Status: DC

## 2017-11-28 MED ORDER — MONTELUKAST SODIUM 10 MG PO TABS
10.0000 mg | ORAL_TABLET | Freq: Every day | ORAL | Status: DC
  Administered 2017-11-29 (×2): 10 mg via ORAL
  Filled 2017-11-28 (×2): qty 1

## 2017-11-28 NOTE — ED Triage Notes (Signed)
The pt has had chest congestion with wheezes and chest pain for 2-3 days.  He cannot take albuterol.  More difficulty breathing today

## 2017-11-28 NOTE — ED Provider Notes (Signed)
[facility] EMERGENCY DEPARTMENT Provider Note   CSN: [id number] Arrival date & time: 11/28/17  2021     History   Chief Complaint Chief Complaint  Patient presents with  . Asthma    HPI Shannon Knapp is a 32 y.o. male.  Patient with lung disease chronic bronchitis/asthma/COPD, has follow-up pulmonary function tests later this month presents with worsening shortness of breath for 2 days.  This is similar to previous however getting worse.  Patient cannot tolerate albuterol.  Not currently on steroids.  No recent surgery, no blood clot history, no leg swelling.  No cardiac history.  Past smoker     Past Medical History:  Diagnosis Date  . Asthma   . Chronic bronchitis (HCC)   . COPD (chronic obstructive pulmonary disease) [facility])     Patient Active Problem List   Diagnosis Date Noted  . Upper airway cough syndrome   . Hypoxemia   . Syncope   . Allergic rhinitis   . Cough   . Hemoptysis   . Protein-calorie malnutrition, severe 10/20/2017  . Acute respiratory failure with hypoxemia (HCC) 10/20/2017  . Acute respiratory distress   . Asthma exacerbation   . Status asthmaticus 10/18/2017  . Severe persistent acute asthmatic bronchitis 10/17/2017  . Elevated blood pressure reading without diagnosis of hypertension 10/17/2017  . Respiratory distress 10/17/2017    Past Surgical History:  Procedure Laterality Date  . VIDEO BRONCHOSCOPY Bilateral 10/24/2017   Procedure: VIDEO BRONCHOSCOPY WITHOUT FLUORO;  Surgeon: Alyson Reedy, MD;  Location: [facility] ENDOSCOPY;  Service: Cardiopulmonary;  Laterality: Bilateral;        Home Medications    Prior to Admission medications   Medication Sig Start Date End Date Taking? Authorizing Provider  fluticasone furoate-vilanterol (BREO ELLIPTA) 100-25 MCG/INH AEPB Inhale 1 puff into the lungs daily.   Yes [provider]  mometasone-formoterol (DULERA) 200-5 MCG/ACT AERO Inhale 2 puffs into the lungs 2  (two) times daily. 10/30/17  Yes Zannie Cove, MD  acetaminophen (TYLENOL) 325 MG tablet Take 2 tablets (650 mg total) by mouth every 6 (six) hours as needed for mild pain (or Fever >/= 101). Patient not taking: Reported on 11/28/2017 10/30/17   Zannie Cove, MD  famotidine (PEPCID) 20 MG tablet Take 1 tablet (20 mg total) by mouth 2 (two) times daily. Patient not taking: Reported on 11/28/2017 10/30/17   Zannie Cove, MD  fluconazole (DIFLUCAN) 100 MG tablet Take 1 tablet (100 mg total) by mouth daily. For 4days Patient not taking: Reported on 11/28/2017 10/30/17   Zannie Cove, MD  montelukast (SINGULAIR) 10 MG tablet Take 1 tablet (10 mg total) by mouth at bedtime. Patient not taking: Reported on 11/28/2017 10/30/17   Zannie Cove, MD  nystatin (MYCOSTATIN) 100000 UNIT/ML suspension Use as directed 5 mLs (500,000 Units total) in the mouth or throat 4 (four) times daily. For 4days Patient not taking: Reported on 11/28/2017 10/30/17   Zannie Cove, MD  pantoprazole (PROTONIX) 40 MG tablet Take 1 tablet (40 mg total) by mouth 2 (two) times daily before a meal. Patient not taking: Reported on 11/28/2017 10/30/17   Zannie Cove, MD  predniSONE (DELTASONE) 10 MG tablet Take 1-4 tablets (10-40 mg total) by mouth daily with breakfast. Take 40mg  daily x 4days then 30mg  daily x 4days then 20mg  daily x 4days then 10mg  daily x 4days then STOP Patient not taking: Reported on 11/28/2017 10/31/17   Zannie Cove, MD  traMADol (ULTRAM) 50 MG tablet Take 1 tablet (  50 mg total) by mouth every 12 (twelve) hours as needed for moderate pain. Patient not taking: Reported on 11/28/2017 10/30/17   Zannie Cove, MD    Family History Family History  Problem Relation Age of Onset  . Hypertension Mother   . Asthma Father   . Hypertension Father     Social History Social History   Tobacco Use  . Smoking status: Former Games developer  . Smokeless tobacco: Never Used  Substance Use Topics  . Alcohol use: Yes      Alcohol/week: 9.0 oz    Types: 15 Cans of beer per week  . Drug use: No     Allergies   Amoxicillin; Fish allergy; Shellfish allergy; and Penicillins   Review of Systems Review of Systems  Constitutional: Positive for diaphoresis. Negative for chills and fever.  HENT: Negative for congestion.   Eyes: Negative for visual disturbance.  Respiratory: Positive for cough, chest tightness and shortness of breath.   Cardiovascular: Negative for chest pain and leg swelling.  Gastrointestinal: Negative for abdominal pain and vomiting.  Genitourinary: Negative for dysuria and flank pain.  Musculoskeletal: Negative for back pain, neck pain and neck stiffness.  Skin: Negative for rash.  Neurological: Negative for light-headedness and headaches.     Physical Exam Updated Vital Signs BP 134/83   Pulse 86   Temp 98.6 F (37 C) (Oral)   Resp 20   Ht 6' (1.829 m)   Wt 71.7 kg (158 lb)   SpO2 94%   BMI 21.43 kg/m   Physical Exam  Constitutional: He is oriented to person, place, and time. He appears well-developed and well-nourished.  HENT:  Head: Normocephalic and atraumatic.  Eyes: Conjunctivae are normal. Right eye exhibits no discharge. Left eye exhibits no discharge.  Neck: Normal range of motion. Neck supple. No tracheal deviation present.  Cardiovascular: Regular rhythm.  Pulmonary/Chest: He is in respiratory distress. He has wheezes.  Abdominal: Soft. He exhibits no distension. There is no tenderness. There is no guarding.  Musculoskeletal: He exhibits no edema.  Neurological: He is alert and oriented to person, place, and time.  Skin: Skin is warm. No rash noted.  Psychiatric: He has a normal mood and affect.  Nursing note and vitals reviewed.    ED Treatments / Results  Labs (all labs ordered are listed, but only abnormal results are displayed) Labs Reviewed  BASIC METABOLIC PANEL - Abnormal; Notable for the following components:      Result Value   Glucose, Bld  122 (*)    BUN <5 (*)    All other components within normal limits  CBC WITH DIFFERENTIAL/PLATELET - Abnormal; Notable for the following components:   Eosinophils Absolute 1.4 (*)    All other components within normal limits  I-STAT TROPONIN, ED    EKG EKG Interpretation  Date/Time:  Friday November 28 2017 20:27:12 EDT Ventricular Rate:  95 PR Interval:  150 QRS Duration: 78 QT Interval:  344 QTC Calculation: 432 R Axis:   89 Text Interpretation:  Normal sinus rhythm with sinus arrhythmia Normal ECG Confirmed by Blane Ohara [phone]) on 11/28/2017 9:42:08 PM   Radiology Dg Chest 2 View  Result Date: 11/28/2017 CLINICAL DATA:  Chest congestion with wheeze and chest pain. EXAM: CHEST - 2 VIEW COMPARISON:  10/21/2017 FINDINGS: The heart size and mediastinal contours are within normal limits. Pulmonary hyperinflation without consolidation, effusion or pulmonary edema. No pneumothorax. The visualized skeletal structures are unremarkable. IMPRESSION: Hyperinflated lungs without active pulmonary disease. Electronically Signed  By: Tollie Ethavid  Kwon M.D.   On: 11/28/2017 21:40    Procedures Procedures (including critical care time)  Medications Ordered in ED Medications  levalbuterol (XOPENEX) nebulizer solution 1.25 mg (1.25 mg Nebulization Not Given 11/28/17 2341)  ipratropium (ATROVENT) nebulizer solution 0.5 mg (0.5 mg Nebulization Given 11/28/17 2106)  methylPREDNISolone sodium succinate (SOLU-MEDROL) 125 mg/2 mL injection 125 mg (125 mg Intravenous Given 11/28/17 2150)  levalbuterol (XOPENEX) nebulizer solution 1.25 mg (1.25 mg Nebulization Given 11/28/17 2201)     Initial Impression / Assessment and Plan / ED Course  I have reviewed the triage vital signs and the nursing notes.  Pertinent labs & imaging results that were available during my care of the patient were reviewed by me and considered in my medical decision making (see chart for details).    Patient presents with  significant respiratory difficulty and clinically COPD/asthma exacerbation.  Patient cannot tolerate albuterol based on history of difficulty with it.  Xopenex ordered repeat doses along with steroids, blood work, chest x-ray.  Likely plan for admission based on work of breathing patient is sitting up leaning forward with diaphoresis.  Discussed with hospitalist for admission.  Blood work reviewed unremarkable.  The patients results and plan were reviewed and discussed.   Any x-rays performed were independently reviewed by myself.   Differential diagnosis were considered with the presenting HPI.  Medications  levalbuterol (XOPENEX) nebulizer solution 1.25 mg (1.25 mg Nebulization Not Given 11/28/17 2341)  ipratropium (ATROVENT) nebulizer solution 0.5 mg (0.5 mg Nebulization Given 11/28/17 2106)  methylPREDNISolone sodium succinate (SOLU-MEDROL) 125 mg/2 mL injection 125 mg (125 mg Intravenous Given 11/28/17 2150)  levalbuterol (XOPENEX) nebulizer solution 1.25 mg (1.25 mg Nebulization Given 11/28/17 2201)    Vitals:   11/28/17 2106 11/28/17 2245 11/28/17 2315 11/28/17 2330  BP:  136/88 (!) 128/112 134/83  Pulse:  85 89 86  Resp:  (!) 21 20 20   Temp:      TempSrc:      SpO2: 99% 94% 95% 94%  Weight:      Height:        Final diagnoses:  Moderate persistent asthma with acute exacerbation    Admission/ observation were discussed with the admitting physician, patient and/or family and they are comfortable with the plan.    Final Clinical Impressions(s) / ED Diagnoses   Final diagnoses:  Moderate persistent asthma with acute exacerbation    ED Discharge Orders    None       Blane OharaZavitz, Cierah Crader, MD 11/28/17 2342

## 2017-11-28 NOTE — H&P (Signed)
History and Physical    Shannon Knapp:[id number] DOB: Sep 18, 1985 DOA: 11/28/2017  PCP: Patient, No Pcp Per  Patient coming from: home   Chief Complaint: cough and shortness of breath  HPI: Shannon Knapp is a 32 y.o. male with medical history significant for asthma with recent extended hospitalization for asthma exacerbation who presents with above.  Has not been able to afford dulera or montelukast. Was able to continue breo. Does not have a rescue inhaler at home.  Beginning yesterday developed cough and wheeze and significant shortness of breath that has worsened. No fevers, no chest pain, no abdominal pain, no hemoptysis, no leg swelling. Symptoms similar to previous asthma exacerbations.  ED Course: xopenex, methylpred, ipratropium, labs, cxr  Review of Systems: As per HPI otherwise 10 point review of systems negative.    Past Medical History:  Diagnosis Date  . Asthma   . Chronic bronchitis (HCC)   . COPD (chronic obstructive pulmonary disease) (HCC)     Past Surgical History:  Procedure Laterality Date  . VIDEO BRONCHOSCOPY Bilateral 10/24/2017   Procedure: VIDEO BRONCHOSCOPY WITHOUT FLUORO;  Surgeon: Alyson Reedy, MD;  Location: [facility] ENDOSCOPY;  Service: Cardiopulmonary;  Laterality: Bilateral;     reports that he has quit smoking. He has never used smokeless tobacco. He reports that he drinks about 9.0 oz of alcohol per week. He reports that he does not use drugs.  Allergies  Allergen Reactions  . Amoxicillin Hives    Has patient had a PCN reaction causing immediate Knapp, facial/tongue/throat swelling, SOB or lightheadedness with hypotension: Yes Has patient had a PCN reaction causing severe Knapp involving mucus membranes or skin necrosis: No Has patient had a PCN reaction that required hospitalization: No Has patient had a PCN reaction occurring within the last 10 years: Yes If all of the above answers are "NO", then may proceed with Cephalosporin  use.  . Fish Allergy Swelling  . Shellfish Allergy Swelling  . Penicillins Hives    Has patient had a PCN reaction causing immediate Knapp, facial/tongue/throat swelling, SOB or lightheadedness with hypotension: Yes Has patient had a PCN reaction causing severe Knapp involving mucus membranes or skin necrosis: No Has patient had a PCN reaction that required hospitalization: No Has patient had a PCN reaction occurring within the last 10 years: Yes If all of the above answers are "NO", then may proceed with Cephalosporin use.    Family History  Problem Relation Age of Onset  . Hypertension Mother   . Asthma Father   . Hypertension Father     Prior to Admission medications   Medication Sig Start Date End Date Taking? Authorizing Provider  fluticasone furoate-vilanterol (BREO ELLIPTA) 100-25 MCG/INH AEPB Inhale 1 puff into the lungs daily.   Yes [provider]  mometasone-formoterol (DULERA) 200-5 MCG/ACT AERO Inhale 2 puffs into the lungs 2 (two) times daily. 10/30/17  Yes Zannie Cove, MD  acetaminophen (TYLENOL) 325 MG tablet Take 2 tablets (650 mg total) by mouth every 6 (six) hours as needed for mild pain (or Fever >/= 101). Patient not taking: Reported on 11/28/2017 10/30/17   Zannie Cove, MD  famotidine (PEPCID) 20 MG tablet Take 1 tablet (20 mg total) by mouth 2 (two) times daily. Patient not taking: Reported on 11/28/2017 10/30/17   Zannie Cove, MD  fluconazole (DIFLUCAN) 100 MG tablet Take 1 tablet (100 mg total) by mouth daily. For 4days Patient not taking: Reported on 11/28/2017 10/30/17   Zannie Cove, MD  montelukast (  SINGULAIR) 10 MG tablet Take 1 tablet (10 mg total) by mouth at bedtime. Patient not taking: Reported on 11/28/2017 10/30/17   Zannie Cove, MD  nystatin (MYCOSTATIN) 100000 UNIT/ML suspension Use as directed 5 mLs (500,000 Units total) in the mouth or throat 4 (four) times daily. For 4days Patient not taking: Reported on 11/28/2017 10/30/17    Zannie Cove, MD  pantoprazole (PROTONIX) 40 MG tablet Take 1 tablet (40 mg total) by mouth 2 (two) times daily before a meal. Patient not taking: Reported on 11/28/2017 10/30/17   Zannie Cove, MD  predniSONE (DELTASONE) 10 MG tablet Take 1-4 tablets (10-40 mg total) by mouth daily with breakfast. Take 40mg  daily x 4days then 30mg  daily x 4days then 20mg  daily x 4days then 10mg  daily x 4days then STOP Patient not taking: Reported on 11/28/2017 10/31/17   Zannie Cove, MD  traMADol (ULTRAM) 50 MG tablet Take 1 tablet (50 mg total) by mouth every 12 (twelve) hours as needed for moderate pain. Patient not taking: Reported on 11/28/2017 10/30/17   Zannie Cove, MD    Physical Exam: Vitals:   11/28/17 2031 11/28/17 2033 11/28/17 2106  BP:  138/68   Pulse:  83   Resp:  18   Temp:  98.6 F (37 C)   TempSrc:  Oral   SpO2:  99% 99%  Weight: 71.7 kg (158 lb)    Height: 6' (1.829 m)      Constitutional: No acute distress Head: Atraumatic Eyes: Conjunctiva clear ENM: Moist mucous membranes. Normal dentition.  Neck: Supple Respiratory: decreased air movement throughout, inspiratory and expiratory wheezes. No tachypnea at rest, no use of accessory muscles Cardiovascular: Regular rate and rhythm. No murmurs/rubs/gallops. Abdomen: Non-tender, non-distended. No masses. No rebound or guarding. Positive bowel sounds. Musculoskeletal: No joint deformity upper and lower extremities. Normal ROM, no contractures. Normal muscle tone.  Skin: No rashes, lesions, or ulcers.  Extremities: No peripheral edema. Palpable peripheral pulses. Neurologic: Alert, moving all 4 extremities. Psychiatric: Normal insight and judgement.   Labs on Admission: I have personally reviewed following labs and imaging studies  CBC: Recent Labs  Lab 11/28/17 2047  WBC 10.1  NEUTROABS 4.2  HGB 15.2  HCT 44.3  MCV 89.3  PLT 319   Basic Metabolic Panel: Recent Labs  Lab 11/28/17 2047  NA 143  K 3.7  CL 108   CO2 26  GLUCOSE 122*  BUN <5*  CREATININE 0.85  CALCIUM 9.5   GFR: Estimated Creatinine Clearance: 125.4 mL/min (by C-G formula based on SCr of 0.85 mg/dL). Liver Function Tests: No results for input(s): AST, ALT, ALKPHOS, BILITOT, PROT, ALBUMIN in the last 168 hours. No results for input(s): LIPASE, AMYLASE in the last 168 hours. No results for input(s): AMMONIA in the last 168 hours. Coagulation Profile: No results for input(s): INR, PROTIME in the last 168 hours. Cardiac Enzymes: No results for input(s): CKTOTAL, CKMB, CKMBINDEX, TROPONINI in the last 168 hours. BNP (last 3 results) No results for input(s): PROBNP in the last 8760 hours. HbA1C: No results for input(s): HGBA1C in the last 72 hours. CBG: No results for input(s): GLUCAP in the last 168 hours. Lipid Profile: No results for input(s): CHOL, HDL, LDLCALC, TRIG, CHOLHDL, LDLDIRECT in the last 72 hours. Thyroid Function Tests: No results for input(s): TSH, T4TOTAL, FREET4, T3FREE, THYROIDAB in the last 72 hours. Anemia Panel: No results for input(s): VITAMINB12, FOLATE, FERRITIN, TIBC, IRON, RETICCTPCT in the last 72 hours. Urine analysis:    Component Value Date/Time  COLORURINE STRAW (A) 10/18/2017 0751   APPEARANCEUR CLEAR 10/18/2017 0751   LABSPEC 1.006 10/18/2017 0751   PHURINE 6.0 10/18/2017 0751   GLUCOSEU 50 (A) 10/18/2017 0751   HGBUR NEGATIVE 10/18/2017 0751   BILIRUBINUR NEGATIVE 10/18/2017 0751   KETONESUR NEGATIVE 10/18/2017 0751   PROTEINUR NEGATIVE 10/18/2017 0751   NITRITE NEGATIVE 10/18/2017 0751   LEUKOCYTESUR NEGATIVE 10/18/2017 0751    Radiological Exams on Admission: Dg Chest 2 View  Result Date: 11/28/2017 CLINICAL DATA:  Chest congestion with wheeze and chest pain. EXAM: CHEST - 2 VIEW COMPARISON:  10/21/2017 FINDINGS: The heart size and mediastinal contours are within normal limits. Pulmonary hyperinflation without consolidation, effusion or pulmonary edema. No pneumothorax. The  visualized skeletal structures are unremarkable. IMPRESSION: Hyperinflated lungs without active pulmonary disease. Electronically Signed   By: Tollie Ethavid  Kwon M.D.   On: 11/28/2017 21:40    EKG: Independently reviewed. nsr  Assessment/Plan Active Problems:   Asthma exacerbation    # Acute asthma exacerbation - with some improvement with steroids and duoneb in ed, but still chest very tight and wheezing. Says syncopized after magnesium in the past and doesn't want; regardless, I do not think severity (no tachypnea, able to speak in full sentences, no use of accessory muscles) warrants at this time. Is also hesitant to resume albuterol given hx tarchycardia w/ albuterol but I explained this is a normal and typically benign reaction. At last hospitlalization last month had tte that showed normal cardiac function. Had bronchoscopy for hemoptysis that was normal safe for thrush. pfts w/o pre/post pronchodilation showed moderate obstruction. - duonebs q6 - prednisone 60 mg po qd - has outpatient pulm f/u 7/16 - I have shared information for [facility] in [place] (where pt lives), a fqhc with sliding scale  DVT prophylaxis: lovenox Code Status: full  Family Communication: fiance cassandra hessling [phone]  Disposition Plan: tbd  Consults called: none  Admission status: med/surg    Silvano BilisNoah B Wouk MD [facility] Pager [phone]  If 7PM-7AM, please contact night-coverage [email or web address] Password [email or web address]  11/28/2017, 11:13 PM

## 2017-11-28 NOTE — ED Provider Notes (Signed)
MSE was initiated and I personally evaluated the patient and placed orders (if any) at  8:35 PM on November 28, 2017.  The patient appears stable so that the remainder of the MSE may be completed by another provider.  Patient placed in Quick Look pathway, seen and evaluated   Chief Complaint: Shortness of breath, wheezing  HPI:   Patient presents to ED for evaluation of chest congestion, shortness of breath and wheezing for the past 2 days.  Symptoms got worse last night and earlier today.  [facility] with no improvement in his symptoms.  He was told that he cannot take albuterol because it caused him to "may be have a heart attack."  Reports coughing up mucus.  No sick contacts with similar symptoms.  Denies any fevers or prior history of PE.  ROS: Shortness of breath  Physical Exam:   Gen: No distress  Neuro: Awake and Alert  Skin: Warm    Focused Exam: Diffuse wheezing in bilateral lungs.  Persistent cough noted. RRR.   Initiation of care has begun. The patient has been counseled on the process, plan, and necessity for staying for the completion/evaluation, and the remainder of the medical screening examination    Dietrich PatesKhatri, Ottilie Wigglesworth, PA-C 11/28/17 2036    Gerhard MunchLockwood, Robert, MD 11/28/17 2253

## 2017-11-29 ENCOUNTER — Other Ambulatory Visit: Payer: Self-pay

## 2017-11-29 MED ORDER — GUAIFENESIN ER 600 MG PO TB12
1200.0000 mg | ORAL_TABLET | Freq: Two times a day (BID) | ORAL | Status: DC
  Administered 2017-11-29 – 2017-11-30 (×3): 1200 mg via ORAL
  Filled 2017-11-29 (×2): qty 2

## 2017-11-29 MED ORDER — METHYLPREDNISOLONE SODIUM SUCC 125 MG IJ SOLR
60.0000 mg | Freq: Four times a day (QID) | INTRAMUSCULAR | Status: DC
  Administered 2017-11-29 – 2017-11-30 (×4): 60 mg via INTRAVENOUS
  Filled 2017-11-29 (×3): qty 2

## 2017-11-29 NOTE — Plan of Care (Signed)
  Problem: Activity: Goal: Risk for activity intolerance will decrease Outcome: Progressing   Problem: Coping: Goal: Level of anxiety will decrease Outcome: Progressing   Problem: Safety: Goal: Ability to remain free from injury will improve Outcome: Progressing   Problem: Skin Integrity: Goal: Risk for impaired skin integrity will decrease Outcome: Progressing   

## 2017-11-29 NOTE — Progress Notes (Signed)
[facility]  PROGRESS NOTE  Shannon BennettRayshawn M Knapp XLK:[id number] DOB: 02/11/1986 DOA: 11/28/2017 PCP: Patient, No Pcp Per   Brief HPI:   32 year old male with a history of asthma came with shortness of breath.  Found to be an asthma exacerbation.    Subjective   This morning patient feels better but still continues to have wheezing bilaterally.   Assessment/Plan:     1. Asthma exacerbation-patient was started on prednisone 60 mg daily with DuoNebs every 6 hours.  We will switch to Solu-Medrol 60 mg IV every 6 hours, Mucinex 1 tablet p.o. twice daily, DuoNebs every 6 hours scheduled.  Continue montelukast daily.    DVT prophylaxis: Lovenox for none  Code Status: Full code  Family Communication: No family at bedside  Disposition Plan: likely home when medically ready for discharge   Consultants:  None  Procedures:  None   Antibiotics:   Anti-infectives (From admission, onward)   None       Objective   Vitals:   11/29/17 0350 11/29/17 1003 11/29/17 1009 11/29/17 1235  BP: 108/68   110/61  Pulse: 90 81  87  Resp: 18 16  20   Temp: (!) 97 F (36.1 C)   98 F (36.7 C)  TempSrc:    Oral  SpO2: 95% 97% 97% 98%  Weight:      Height:        Intake/Output Summary (Last 24 hours) at 11/29/2017 1302 Last data filed at 11/29/2017 0900 Gross per 24 hour  Intake 120 ml  Output 200 ml  Net -80 ml   Filed Weights   11/28/17 2031 11/29/17 0000  Weight: 71.7 kg (158 lb) 72.6 kg (160 lb)     Physical Examination:    General: Appears in no acute distress  Cardiovascular: S1-S2, regular  Respiratory: Bilateral wheezing  Abdomen: Soft, nontender, no organomegaly*  Extremities: No edema in the lower extremities  Neurologic: Alert, oriented x3     Data Reviewed: I have personally reviewed following labs and imaging studies  CBG: No results for input(s): GLUCAP in the last 168 hours.  CBC: Recent Labs  Lab 11/28/17 2047  WBC 10.1   NEUTROABS 4.2  HGB 15.2  HCT 44.3  MCV 89.3  PLT 319    Basic Metabolic Panel: Recent Labs  Lab 11/28/17 2047  NA 143  K 3.7  CL 108  CO2 26  GLUCOSE 122*  BUN <5*  CREATININE 0.85  CALCIUM 9.5      Studies: Dg Chest 2 View  Result Date: 11/28/2017 CLINICAL DATA:  Chest congestion with wheeze and chest pain. EXAM: CHEST - 2 VIEW COMPARISON:  10/21/2017 FINDINGS: The heart size and mediastinal contours are within normal limits. Pulmonary hyperinflation without consolidation, effusion or pulmonary edema. No pneumothorax. The visualized skeletal structures are unremarkable. IMPRESSION: Hyperinflated lungs without active pulmonary disease. Electronically Signed   By: Tollie Ethavid  Kwon M.D.   On: 11/28/2017 21:40    Scheduled Meds: . enoxaparin (LOVENOX) injection  40 mg Subcutaneous Q24H  . guaiFENesin  1,200 mg Oral BID  . ipratropium-albuterol  3 mL Nebulization Q6H  . methylPREDNISolone (SOLU-MEDROL) injection  60 mg Intravenous Q6H  . mometasone-formoterol  2 puff Inhalation BID  . montelukast  10 mg Oral QHS  . sodium chloride flush  3 mL Intravenous Q12H      Time spent: 20 min  Meredeth IdeGagan S Lama   [facility] Pager [phone]. If 7PM-7AM, please contact night-coverage at [email or web address], Office  [phone]  password [email or web address]  11/29/2017, 1:02 PM  LOS: 0 days

## 2017-11-30 DIAGNOSIS — J4541 Moderate persistent asthma with (acute) exacerbation: Principal | ICD-10-CM

## 2017-11-30 MED ORDER — MONTELUKAST SODIUM 10 MG PO TABS: 10.0000 mg | ORAL_TABLET | Freq: Every day | ORAL | 2 refills | Status: DC

## 2017-11-30 MED ORDER — GUAIFENESIN ER 600 MG PO TB12: 1200.0000 mg | ORAL_TABLET | Freq: Two times a day (BID) | ORAL | 0 refills | Status: AC

## 2017-11-30 MED ORDER — MOMETASONE FURO-FORMOTEROL FUM 200-5 MCG/ACT IN AERO: 2.0000 | INHALATION_SPRAY | Freq: Two times a day (BID) | RESPIRATORY_TRACT | 2 refills | Status: DC

## 2017-11-30 MED ORDER — ALBUTEROL SULFATE HFA 108 (90 BASE) MCG/ACT IN AERS: 2.0000 | INHALATION_SPRAY | Freq: Four times a day (QID) | RESPIRATORY_TRACT | 2 refills | Status: DC | PRN

## 2017-11-30 MED ORDER — PREDNISONE 10 MG PO TABS: ORAL_TABLET | ORAL | 0 refills | Status: DC

## 2017-11-30 NOTE — Progress Notes (Signed)
Patient given discharge instructions and all questions answered.  

## 2017-11-30 NOTE — Progress Notes (Signed)
Patient sleeping during shift report.      

## 2017-11-30 NOTE — Discharge Summary (Signed)
Physician Discharge Summary  Shannon BennettRayshawn M Knapp ZOX:[id number] DOB: 11/26/1985 DOA: 11/28/2017  PCP: Patient, No Pcp Per  Admit date: 11/28/2017 Discharge date: 11/30/2017  Time spent: 25 minutes  Recommendations for Outpatient Follow-up:  1. Follow-up PCP in 2 weeks   Discharge Diagnoses:  Active Problems:   Asthma exacerbation   Discharge Condition: Stable regular  Diet recommendation: Regular  Filed Weights   11/28/17 2031 11/29/17 0000 11/30/17 0352  Weight: 71.7 kg (158 lb) 72.6 kg (160 lb) 71.6 kg (157 lb 14.4 oz)    History of present illness:  32 year old male with a history of asthma came with shortness of breath.  Found to be an asthma exacerbation.     Hospital Course:  Asthma exacerbation-improved after patient started on Solu-Medrol, duo nebs, Mucinex.  He is currently not requiring oxygen.  O2 sats 98% on room air.  Will discharge on prednisone taper, Dulera, Mucinex 1 tablet p.o. twice daily, albuterol as needed  Procedures:  None  Consultations:  None  Discharge Exam: Vitals:   11/30/17 0352 11/30/17 0924  BP: (!) 127/55   Pulse: 70   Resp: 16   Temp: 98.3 F (36.8 C)   SpO2: 96% 98%    General: Appears in no acute distress Cardiovascular: S1-S2, regular Respiratory: Scattered rhonchi bilaterally  Discharge Instructions   Discharge Instructions    Diet - low sodium heart healthy   Complete by:  As directed    Diet - low sodium heart healthy   Complete by:  As directed    Increase activity slowly   Complete by:  As directed    Increase activity slowly   Complete by:  As directed      Allergies as of 11/30/2017      Reactions   Amoxicillin Hives   Has patient had a PCN reaction causing immediate rash, facial/tongue/throat swelling, SOB or lightheadedness with hypotension: Yes Has patient had a PCN reaction causing severe rash involving mucus membranes or skin necrosis: No Has patient had a PCN reaction that required  hospitalization: No Has patient had a PCN reaction occurring within the last 10 years: Yes If all of the above answers are "NO", then may proceed with Cephalosporin use.   Fish Allergy Swelling   Shellfish Allergy Swelling   Penicillins Hives   Has patient had a PCN reaction causing immediate rash, facial/tongue/throat swelling, SOB or lightheadedness with hypotension: Yes Has patient had a PCN reaction causing severe rash involving mucus membranes or skin necrosis: No Has patient had a PCN reaction that required hospitalization: No Has patient had a PCN reaction occurring within the last 10 years: Yes If all of the above answers are "NO", then may proceed with Cephalosporin use.      Medication List    STOP taking these medications   BREO ELLIPTA 100-25 MCG/INH Aepb Generic drug:  fluticasone furoate-vilanterol   famotidine 20 MG tablet Commonly known as:  PEPCID   fluconazole 100 MG tablet Commonly known as:  DIFLUCAN   nystatin 100000 UNIT/ML suspension Commonly known as:  MYCOSTATIN   pantoprazole 40 MG tablet Commonly known as:  PROTONIX   traMADol 50 MG tablet Commonly known as:  ULTRAM     TAKE these medications   acetaminophen 325 MG tablet Commonly known as:  TYLENOL Take 2 tablets (650 mg total) by mouth every 6 (six) hours as needed for mild pain (or Fever >/= 101).   albuterol 108 (90 Base) MCG/ACT inhaler Commonly known as:  PROVENTIL HFA;VENTOLIN HFA  Inhale 2 puffs into the lungs every 6 (six) hours as needed for wheezing or shortness of breath.   guaiFENesin 600 MG 12 hr tablet Commonly known as:  MUCINEX Take 2 tablets (1,200 mg total) by mouth 2 (two) times daily for 5 days.   mometasone-formoterol 200-5 MCG/ACT Aero Commonly known as:  DULERA Inhale 2 puffs into the lungs 2 (two) times daily.   montelukast 10 MG tablet Commonly known as:  SINGULAIR Take 1 tablet (10 mg total) by mouth at bedtime.   predniSONE 10 MG tablet Commonly known as:   DELTASONE Prednisone 40 mg po daily x 1 day then Prednisone 30 mg po daily x 1 day then Prednisone 20 mg po daily x 1 day then Prednisone 10 mg daily x 1 day then stop... What changed:    how much to take  how to take this  when to take this  additional instructions      Allergies  Allergen Reactions  . Amoxicillin Hives    Has patient had a PCN reaction causing immediate rash, facial/tongue/throat swelling, SOB or lightheadedness with hypotension: Yes Has patient had a PCN reaction causing severe rash involving mucus membranes or skin necrosis: No Has patient had a PCN reaction that required hospitalization: No Has patient had a PCN reaction occurring within the last 10 years: Yes If all of the above answers are "NO", then may proceed with Cephalosporin use.  . Fish Allergy Swelling  . Shellfish Allergy Swelling  . Penicillins Hives    Has patient had a PCN reaction causing immediate rash, facial/tongue/throat swelling, SOB or lightheadedness with hypotension: Yes Has patient had a PCN reaction causing severe rash involving mucus membranes or skin necrosis: No Has patient had a PCN reaction that required hospitalization: No Has patient had a PCN reaction occurring within the last 10 years: Yes If all of the above answers are "NO", then may proceed with Cephalosporin use.      The results of significant diagnostics from this hospitalization (including imaging, microbiology, ancillary and laboratory) are listed below for reference.    Significant Diagnostic Studies: Dg Chest 2 View  Result Date: 11/28/2017 CLINICAL DATA:  Chest congestion with wheeze and chest pain. EXAM: CHEST - 2 VIEW COMPARISON:  10/21/2017 FINDINGS: The heart size and mediastinal contours are within normal limits. Pulmonary hyperinflation without consolidation, effusion or pulmonary edema. No pneumothorax. The visualized skeletal structures are unremarkable. IMPRESSION: Hyperinflated lungs without active  pulmonary disease. Electronically Signed   By: Tollie Eth M.D.   On: 11/28/2017 21:40    Microbiology: No results found for this or any previous visit (from the past 240 hour(s)).   Labs: Basic Metabolic Panel: Recent Labs  Lab 11/28/17 2047  NA 143  K 3.7  CL 108  CO2 26  GLUCOSE 122*  BUN <5*  CREATININE 0.85  CALCIUM 9.5   Liver Function Tests: No results for input(s): AST, ALT, ALKPHOS, BILITOT, PROT, ALBUMIN in the last 168 hours. No results for input(s): LIPASE, AMYLASE in the last 168 hours. No results for input(s): AMMONIA in the last 168 hours. CBC: Recent Labs  Lab 11/28/17 2047  WBC 10.1  NEUTROABS 4.2  HGB 15.2  HCT 44.3  MCV 89.3  PLT 319       Signed:  Meredeth Ide MD.  [facility] 11/30/2017, 10:01 AM

## 2017-11-30 NOTE — Progress Notes (Signed)
Call placed to [facility] to notify of telemetry monitoring d/c.   

## 2017-12-01 ENCOUNTER — Other Ambulatory Visit: Payer: Self-pay | Admitting: Pulmonary Disease

## 2017-12-01 DIAGNOSIS — R058 Other specified cough: Secondary | ICD-10-CM

## 2017-12-01 DIAGNOSIS — R05 Cough: Secondary | ICD-10-CM

## 2017-12-02 ENCOUNTER — Ambulatory Visit (INDEPENDENT_AMBULATORY_CARE_PROVIDER_SITE_OTHER): Payer: Self-pay | Admitting: Pulmonary Disease

## 2017-12-02 ENCOUNTER — Encounter: Payer: Self-pay | Admitting: Pulmonary Disease

## 2017-12-02 VITALS — BP 118/78 | HR 78 | Ht 71.0 in | Wt 164.4 lb

## 2017-12-02 DIAGNOSIS — J4550 Severe persistent asthma, uncomplicated: Secondary | ICD-10-CM

## 2017-12-02 MED ORDER — BUDESONIDE-FORMOTEROL FUMARATE 160-4.5 MCG/ACT IN AERO: 2.0000 | INHALATION_SPRAY | Freq: Two times a day (BID) | RESPIRATORY_TRACT | 0 refills | Status: DC

## 2017-12-02 NOTE — Progress Notes (Signed)
Shannon Knapp    [id number]    10/18/1985  Primary Care Physician:Patient, No Pcp Per  Referring Physician: No referring provider defined for this encounter.  Chief complaint: Follow-up for severe persistent asthma.  HPI: 32 year old with history of chronic bronchitis, asthma Has had multiple ED visits including [facility] in 2018 and at [facility] emergency on 09/07/2017 with cough, dyspnea, wheezing.  At last visit he was steroids, nebulizers.  X-ray at that time was clear.  Has been prescribed inhalers but is unable to afford them due to uninsured status.   Comes to the clinic today with 3 days of dyspnea with activity and at rest, cough with white mucus, wheezing.  He had few specks of blood when he coughed up.  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  Quit in March 2019.  Smokes marijuana occasionally Travel history: Lived in [place] for 10 years.  No other recent travel Relevant family history: No significant family history of lung disease.  Interim History: Hospitalized twice in the past few months for status asthmaticus, asthma exacerbations requiring prolonged prednisone treatment.  He had a bronchoscopy done 10/24/2017 for intermittent hemoptysis which did not show any abnormality. He was discharged on [facility] however he ran out of inhaler and within 4 days ended up back in the hospital with exacerbation.  Feels that Breo does not help with the breathing is well as Dulera.  Outpatient Encounter Medications as of 12/02/2017  Medication Sig  . guaiFENesin (MUCINEX) 600 MG 12 hr tablet Take 2 tablets (1,200 mg total) by mouth 2 (two) times daily for 5 days.  . mometasone-formoterol (DULERA) 200-5 MCG/ACT AERO Inhale 2 puffs into the lungs 2 (two) times daily.  Marland Kitchen ipratropium (ATROVENT) 0.02 % nebulizer solution Take 0.5 mg by  nebulization 2 (two) times daily.  . montelukast (SINGULAIR) 10 MG tablet Take 1 tablet (10 mg total) by mouth at bedtime. (Patient not taking: Reported on 12/02/2017)  . predniSONE (DELTASONE) 10 MG tablet Prednisone 40 mg po daily x 1 day then Prednisone 30 mg po daily x 1 day then Prednisone 20 mg po daily x 1 day then Prednisone 10 mg daily x 1 day then stop... (Patient not taking: Reported on 12/02/2017)  . [DISCONTINUED] acetaminophen (TYLENOL) 325 MG tablet Take 2 tablets (650 mg total) by mouth every 6 (six) hours as needed for mild pain (or Fever >/= 101). (Patient not taking: Reported on 11/28/2017)  . [DISCONTINUED] albuterol (PROVENTIL HFA;VENTOLIN HFA) 108 (90 Base) MCG/ACT inhaler Inhale 2 puffs into the lungs every 6 (six) hours as needed for wheezing or shortness of breath.   No facility-administered encounter medications on file as of 12/02/2017.     Allergies as of 12/02/2017 - Review Complete 12/02/2017  Allergen Reaction Noted  . Amoxicillin Hives 11/23/2016  . Fish allergy Swelling 10/17/2017  . Shellfish allergy Swelling 10/17/2017  . Penicillins Hives 11/15/2016    Past Medical History:  Diagnosis Date  . Asthma   . Chronic bronchitis (HCC)   . COPD (chronic obstructive pulmonary disease) (HCC)     Past Surgical History:  Procedure Laterality Date  . VIDEO BRONCHOSCOPY Bilateral 10/24/2017   Procedure: VIDEO BRONCHOSCOPY WITHOUT FLUORO;  Surgeon: Alyson Reedy, MD;  Location: [facility] ENDOSCOPY;  Service: Cardiopulmonary;  Laterality: Bilateral;    Family History  Problem Relation Age  of Onset  . Hypertension Mother   . Asthma Father   . Hypertension Father     Social History   Socioeconomic History  . Marital status: Single    Spouse name: Not on file  . Number of children: Not on file  . Years of education: Not on file  . Highest education level: Not on file  Occupational History  . Not on file  Social Needs  . Financial resource strain: Not on file    . Food insecurity:    Worry: Not on file    Inability: Not on file  . Transportation needs:    Medical: Not on file    Non-medical: Not on file  Tobacco Use  . Smoking status: Former Smoker    Types: Cigarettes    Last attempt to quit: 09/02/2017    Years since quitting: 0.2  . Smokeless tobacco: Never Used  . Tobacco comment: Pt smoked on and off for 2-3 years and stated one pack would last  1-2 weeks  Substance and Sexual Activity  . Alcohol use: Yes    Alcohol/week: 9.0 oz    Types: 15 Cans of beer per week  . Drug use: No  . Sexual activity: Yes  Lifestyle  . Physical activity:    Days per week: Not on file    Minutes per session: Not on file  . Stress: Not on file  Relationships  . Social connections:    Talks on phone: Not on file    Gets together: Not on file    Attends religious service: Not on file    Active member of club or organization: Not on file    Attends meetings of clubs or organizations: Not on file    Relationship status: Not on file  . Intimate partner violence:    Fear of current or ex partner: Not on file    Emotionally abused: Not on file    Physically abused: Not on file    Forced sexual activity: Not on file  Other Topics Concern  . Not on file  Social History Narrative  . Not on file    Review of systems: Review of Systems  Constitutional: Negative for fever and chills.  HENT: Negative.   Eyes: Negative for blurred vision.  Respiratory: as per HPI  Cardiovascular: Negative for chest pain and palpitations.  Gastrointestinal: Negative for vomiting, diarrhea, blood per rectum. Genitourinary: Negative for dysuria, urgency, frequency and hematuria.  Musculoskeletal: Negative for myalgias, back pain and joint pain.  Skin: Negative for itching and rash.  Neurological: Negative for dizziness, tremors, focal weakness, seizures and loss of consciousness.  Endo/Heme/Allergies: Negative for environmental allergies.  Psychiatric/Behavioral:  Negative for depression, suicidal ideas and hallucinations.  All other systems reviewed and are negative.  Physical Exam: Blood pressure 118/78, pulse 78, height 5\' 11"  (1.803 m), weight 164 lb 6.4 oz (74.6 kg), SpO2 99 %. Gen:      No acute distress HEENT:  EOMI, sclera anicteric Neck:     No masses; no thyromegaly Lungs:    Diminished air entry.  No wheeze or crackles CV:         Regular rate and rhythm; no murmurs Abd:      + bowel sounds; soft, non-tender; no palpable masses, no distension Ext:    No edema; adequate peripheral perfusion Skin:      Warm and dry; no rash Neuro: alert and oriented x 3 Psych: normal mood and affect  Data Reviewed: Imaging  CT chest 10/22/2017- bronchial wall thickening, dependent atelectasis in the lower lobe. CT abdomen pelvis 10/27/2017- no acute intra-abdominal process. Chest x-ray 11/28/2017-hyperinflated lungs, no acute abnormality I have reviewed the images personally.  PFTs 10/30/2017 FVC 5.81 [120%], FEV1 2.38 [1%], F/F 41, TLC 166%, RV/TLC 221%, DLCO 104%, Poor patient effort,  Patient did not want albuterol Moderate obstructive airway disease with overinflation, trapping.  Normal diffusion defect.  FENO  10/17/2017-95 12/02/2017-54  Labs CBC 11/28/2017-WBC 10.1, eos 14%, absolute eosinophil count 1400 IgE 10/22/2017-2209  Assessment:  Severe persistent asthma, severe allergies 2 recent hospitalizations for asthma exacerbation. Currently back on Dulera.  Unable to afford Singulair due to the cost involved This is a difficult situation since he does not have insurance and frequently runs out of medications and ends up in the ED.  We will give him samples of inhaler, start patient assistance paperwork work. I have emphasized to him that he needs to call us if he needs samples Keep prednisone at home in case he has a flare up Use Zyrtec, Benadryl over-the-counter for allergies. He would be a good candidate for biologics given markedly  elevated peripheral eosinophils and IgE however we need to wait for his insurance to be approved I have asked him to make sure that his house is clean, inspected for mold, carpets vacuumed and filters changed.  Plan/Recommendations: - Renew Dulera.  Give samples of inhaler - Prednisone taper be held in reserve in case of an exacerbation - Patient assistance to help with cost of medication.  Chilton GreathousePraveen Angelisa Winthrop MD [facility] 12/02/2017, 4:01 PM  CC: No ref. provider found

## 2017-12-02 NOTE — Patient Instructions (Signed)
Continue using the [facility] We will give you samples of Symbicort which works similar to Goodyear TireDulera.  Use this in case you run out of [facility] Call us if you run out of medication so we can give you samples Check your house for any mold or dust and clean up to prevent allergies and inflammation Refill the prednisone and keep it at home to use in case of emergencies We will see if you can get you on some kind of patient assistance to help with the cost of medication Follow-up in 3 to 4 months.

## 2018-02-18 ENCOUNTER — Telehealth: Payer: Self-pay | Admitting: Pulmonary Disease

## 2018-02-18 MED ORDER — BUDESONIDE-FORMOTEROL FUMARATE 160-4.5 MCG/ACT IN AERO: 2.0000 | INHALATION_SPRAY | Freq: Two times a day (BID) | RESPIRATORY_TRACT | 0 refills | Status: DC

## 2018-02-18 NOTE — Telephone Encounter (Addendum)
Called and spoke to pt, who is requesting samples of Symbicort 160 to get him by until his 02/19/18 OV. One sample of Symbicort 160 has been placed up front for pick up.  Nothing further is needed.

## 2018-02-19 ENCOUNTER — Ambulatory Visit: Payer: Self-pay | Admitting: Pulmonary Disease

## 2018-03-02 ENCOUNTER — Telehealth: Payer: Self-pay | Admitting: Pulmonary Disease

## 2018-03-02 MED ORDER — BUDESONIDE-FORMOTEROL FUMARATE 160-4.5 MCG/ACT IN AERO: 2.0000 | INHALATION_SPRAY | Freq: Two times a day (BID) | RESPIRATORY_TRACT | 0 refills | Status: DC

## 2018-03-02 NOTE — Telephone Encounter (Signed)
Pt is returning call CB [phone]/kob

## 2018-03-02 NOTE — Telephone Encounter (Signed)
Called told patient we did not have any samples at this current time.  Nothing further needed.

## 2018-03-02 NOTE — Telephone Encounter (Signed)
Patient called back states he is out of symbicort 160 and doesn't have an appt until the 03/16/2018. Samples provided and left up front for pick up.  Nothing further needed at this time.

## 2018-03-16 ENCOUNTER — Ambulatory Visit (INDEPENDENT_AMBULATORY_CARE_PROVIDER_SITE_OTHER): Payer: Self-pay | Admitting: Pulmonary Disease

## 2018-03-16 ENCOUNTER — Encounter: Payer: Self-pay | Admitting: Pulmonary Disease

## 2018-03-16 VITALS — BP 120/84 | HR 101 | Ht 71.0 in | Wt 164.0 lb

## 2018-03-16 DIAGNOSIS — J4550 Severe persistent asthma, uncomplicated: Secondary | ICD-10-CM

## 2018-03-16 MED ORDER — MONTELUKAST SODIUM 10 MG PO TABS: 10.0000 mg | ORAL_TABLET | Freq: Every day | ORAL | 2 refills | Status: DC

## 2018-03-16 MED ORDER — IPRATROPIUM BROMIDE HFA 17 MCG/ACT IN AERS: 2.0000 | INHALATION_SPRAY | Freq: Four times a day (QID) | RESPIRATORY_TRACT | 6 refills | Status: DC | PRN

## 2018-03-16 MED ORDER — PREDNISONE 10 MG PO TABS: 10.0000 mg | ORAL_TABLET | Freq: Every day | ORAL | 3 refills | Status: DC

## 2018-03-16 MED ORDER — MOMETASONE FURO-FORMOTEROL FUM 100-5 MCG/ACT IN AERO: 2.0000 | INHALATION_SPRAY | Freq: Two times a day (BID) | RESPIRATORY_TRACT | 0 refills | Status: DC

## 2018-03-16 NOTE — Progress Notes (Signed)
Shannon Knapp    [id number]    September 15, 1985  Primary Care Physician:Patient, No Pcp Per  Referring Physician: No referring provider defined for this encounter.  Chief complaint: Follow-up for severe persistent asthma.  HPI: 32 year old with history of chronic bronchitis, asthma Has had multiple ED visits including [facility] in 2018 and at [facility] emergency on 09/07/2017 with cough, dyspnea, wheezing.  Hospitalized twice in 2019 to [facility] for status asthmaticus, asthma exacerbations requiring prolonged prednisone treatment.  He had a bronchoscopy done 10/24/2017 for intermittent hemoptysis which did not show any abnormality.  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  Exposures: No known exposures, no mold, hot tubs Smoking history: 5-pack-year smoking history.  Smokes marijuana occasionally Travel history: Lived in [place] for 10 years.  No other recent travel Relevant family history: No significant family history of lung disease.  Interim History: Continues on Dulera/Symbicort whenever he can get samples He is using prednisone intermittently.  Feels good on the days he uses prednisone but feels symptoms worsen whenever he is off it.  Started working at [employer].  Outpatient Encounter Medications as of 03/16/2018  Medication Sig  . budesonide-formoterol (SYMBICORT) 160-4.5 MCG/ACT inhaler Inhale 2 puffs into the lungs 2 (two) times daily.  Marland Kitchen ipratropium (ATROVENT) 0.02 % nebulizer solution Take 0.5 mg by nebulization 2 (two) times daily.  . [DISCONTINUED] budesonide-formoterol (SYMBICORT) 160-4.5 MCG/ACT inhaler Inhale 2 puffs into the lungs 2 (two) times daily.  . [DISCONTINUED] predniSONE (DELTASONE) 10 MG tablet Prednisone 40 mg po daily x 1 day then Prednisone 30 mg po daily x 1 day then Prednisone 20 mg po daily x 1 day then Prednisone 10 mg daily x 1 day then stop...  . mometasone-formoterol (DULERA)  200-5 MCG/ACT AERO Inhale 2 puffs into the lungs 2 (two) times daily. (Patient not taking: Reported on 03/16/2018)  . montelukast (SINGULAIR) 10 MG tablet Take 1 tablet (10 mg total) by mouth at bedtime. (Patient not taking: Reported on 03/16/2018)  . [DISCONTINUED] budesonide-formoterol (SYMBICORT) 160-4.5 MCG/ACT inhaler Inhale 2 puffs into the lungs 2 (two) times daily for 1 day.   No facility-administered encounter medications on file as of 03/16/2018.    Physical Exam: Blood pressure 120/84, pulse (!) 101, height 5\' 11"  (1.803 m), weight 164 lb (74.4 kg), SpO2 95 %. Gen:      No acute distress HEENT:  EOMI, sclera anicteric Neck:     No masses; no thyromegaly Lungs:    Clear to auscultation bilaterally; normal respiratory effort CV:         Regular rate and rhythm; no murmurs Abd:      + bowel sounds; soft, non-tender; no palpable masses, no distension Ext:    No edema; adequate peripheral perfusion Skin:      Warm and dry; no rash Neuro: alert and oriented x 3 Psych: normal mood and affect  Data Reviewed: Imaging CT chest 10/22/2017- bronchial wall thickening, dependent atelectasis in the lower lobe. CT abdomen pelvis 10/27/2017- no acute intra-abdominal process. Chest x-ray 11/28/2017-hyperinflated lungs, no acute abnormality I have reviewed the images personally.  PFTs 10/30/2017 FVC 5.81 [120%], FEV1 2.38 [61%], F/F 41, TLC 166%, RV/TLC 221%, DLCO 104%, Poor patient effort,  Patient did not want albuterol Moderate obstructive airway disease with overinflation, trapping.  Normal diffusion defect.  FENO  10/17/2017-95 12/02/2017-54  ACQ6 03/16/18- 3  Labs CBC 11/28/2017-WBC 10.1, eos 14%, absolute eosinophil  count 1400 IgE 10/22/2017-2209  Assessment:  Severe persistent asthma, severe allergies Multiple hospitalizations for asthma exacerbation. He does not have insurance and frequently runs out of medications and ends up in the ED. Unable to afford Singulair due to the cost  involved  Continue Dulera, support with samples Start prednisone.  Use 5 to 20 mg a day based on how his symptoms are He will benefit from biologics to get off steroids but we are unable to start now but he still waiting for insurance to kick in Use Zyrtec, Benadryl over-the-counter for allergies.  Active smoker Emphasized that he needs to quit smoking as soon as possible to help with breathing.  He is sure he can do it on his own Reassess at return visit.  Time spent counseling-5 minutes  Plan/Recommendations: - Continue Dulera - Standing prednisone - Smoking cessation  Chilton Greathouse MD [facility] 03/16/2018, 3:55 PM  CC: No ref. provider found

## 2018-03-16 NOTE — Patient Instructions (Signed)
We will start you on prednisone 10 mg a day.  You can increase the dose up to 20 mg or reduce the dose to 5 mg depending on your symptoms Continue to work on smoking cessation Continue Dulera Follow-up in 3 months.

## 2018-04-09 ENCOUNTER — Emergency Department (HOSPITAL_COMMUNITY): Payer: Self-pay

## 2018-04-09 ENCOUNTER — Telehealth: Payer: Self-pay | Admitting: Pulmonary Disease

## 2018-04-09 ENCOUNTER — Encounter (HOSPITAL_COMMUNITY): Payer: Self-pay | Admitting: Pharmacy Technician

## 2018-04-09 ENCOUNTER — Observation Stay (HOSPITAL_COMMUNITY)
Admission: EM | Admit: 2018-04-09 | Discharge: 2018-04-10 | Disposition: A | Discharge status: Home / Self Care | Payer: Self-pay | Attending: Internal Medicine | Admitting: Internal Medicine

## 2018-04-09 ENCOUNTER — Other Ambulatory Visit: Payer: Self-pay

## 2018-04-09 DIAGNOSIS — R509 Fever, unspecified: Secondary | ICD-10-CM

## 2018-04-09 DIAGNOSIS — J449 Chronic obstructive pulmonary disease, unspecified: Secondary | ICD-10-CM | POA: Insufficient documentation

## 2018-04-09 DIAGNOSIS — F17210 Nicotine dependence, cigarettes, uncomplicated: Secondary | ICD-10-CM | POA: Insufficient documentation

## 2018-04-09 DIAGNOSIS — R0602 Shortness of breath: Secondary | ICD-10-CM | POA: Insufficient documentation

## 2018-04-09 DIAGNOSIS — Z7952 Long term (current) use of systemic steroids: Secondary | ICD-10-CM | POA: Insufficient documentation

## 2018-04-09 DIAGNOSIS — R5383 Other fatigue: Secondary | ICD-10-CM | POA: Insufficient documentation

## 2018-04-09 DIAGNOSIS — J4541 Moderate persistent asthma with (acute) exacerbation: Principal | ICD-10-CM | POA: Insufficient documentation

## 2018-04-09 DIAGNOSIS — J45901 Unspecified asthma with (acute) exacerbation: Secondary | ICD-10-CM | POA: Diagnosis present

## 2018-04-09 DIAGNOSIS — J310 Chronic rhinitis: Secondary | ICD-10-CM | POA: Insufficient documentation

## 2018-04-09 LAB — BASIC METABOLIC PANEL
Anion gap: 11 (ref 5–15)
BUN: 12 mg/dL (ref 6–20)
CALCIUM: 9.3 mg/dL (ref 8.9–10.3)
CO2: 23 mmol/L (ref 22–32)
CREATININE: 0.96 mg/dL (ref 0.61–1.24)
Chloride: 101 mmol/L (ref 98–111)
Glucose, Bld: 86 mg/dL (ref 70–99)
Potassium: 3.7 mmol/L (ref 3.5–5.1)
SODIUM: 135 mmol/L (ref 135–145)

## 2018-04-09 LAB — CBC WITH DIFFERENTIAL/PLATELET
Abs Immature Granulocytes: 0.07 10*3/uL (ref 0.00–0.07)
BASOS ABS: 0 10*3/uL (ref 0.0–0.1)
BASOS PCT: 0 %
EOS ABS: 0.1 10*3/uL (ref 0.0–0.5)
EOS PCT: 1 %
HEMATOCRIT: 43.7 % (ref 39.0–52.0)
Hemoglobin: 14.2 g/dL (ref 13.0–17.0)
Immature Granulocytes: 1 %
LYMPHS ABS: 2.2 10*3/uL (ref 0.7–4.0)
Lymphocytes Relative: 16 %
MCH: 29.5 pg (ref 26.0–34.0)
MCHC: 32.5 g/dL (ref 30.0–36.0)
MCV: 90.9 fL (ref 80.0–100.0)
MONOS PCT: 8 %
Monocytes Absolute: 1.1 10*3/uL — ABNORMAL HIGH (ref 0.1–1.0)
NRBC: 0 % (ref 0.0–0.2)
Neutro Abs: 10.1 10*3/uL — ABNORMAL HIGH (ref 1.7–7.7)
Neutrophils Relative %: 74 %
Platelets: 304 10*3/uL (ref 150–400)
RBC: 4.81 MIL/uL (ref 4.22–5.81)
RDW: 13 % (ref 11.5–15.5)
WBC: 13.6 10*3/uL — ABNORMAL HIGH (ref 4.0–10.5)

## 2018-04-09 MED ORDER — MAGNESIUM SULFATE 2 GM/50ML IV SOLN
2.0000 g | Freq: Once | INTRAVENOUS | Status: AC
  Administered 2018-04-09: 2 g via INTRAVENOUS
  Filled 2018-04-09: qty 50

## 2018-04-09 MED ORDER — IPRATROPIUM BROMIDE 0.02 % IN SOLN
0.5000 mg | Freq: Once | RESPIRATORY_TRACT | Status: DC
  Filled 2018-04-09: qty 2.5

## 2018-04-09 MED ORDER — ALBUTEROL (5 MG/ML) CONTINUOUS INHALATION SOLN: 10.0000 mg/h | INHALATION_SOLUTION | Freq: Once | RESPIRATORY_TRACT | Status: DC

## 2018-04-09 MED ORDER — SODIUM CHLORIDE 0.9 % IV BOLUS
1000.0000 mL | Freq: Once | INTRAVENOUS | Status: AC
  Administered 2018-04-09: 1000 mL via INTRAVENOUS

## 2018-04-09 MED ORDER — METHYLPREDNISOLONE SODIUM SUCC 125 MG IJ SOLR
125.0000 mg | Freq: Once | INTRAMUSCULAR | Status: AC
  Administered 2018-04-09: 125 mg via INTRAVENOUS
  Filled 2018-04-09: qty 2

## 2018-04-09 MED ORDER — ACETAMINOPHEN 500 MG PO TABS
1000.0000 mg | ORAL_TABLET | Freq: Once | ORAL | Status: AC
  Administered 2018-04-09: 1000 mg via ORAL
  Filled 2018-04-09: qty 2

## 2018-04-09 MED ORDER — IPRATROPIUM-ALBUTEROL 0.5-2.5 (3) MG/3ML IN SOLN
3.0000 mL | Freq: Four times a day (QID) | RESPIRATORY_TRACT | Status: DC
  Administered 2018-04-09: 3 mL via RESPIRATORY_TRACT
  Filled 2018-04-09: qty 3

## 2018-04-09 NOTE — ED Provider Notes (Addendum)
[facility] EMERGENCY DEPARTMENT Provider Note   CSN: [id number] Arrival date & time: 04/09/18  2102     History   Chief Complaint Chief Complaint  Patient presents with  . Shortness of Breath    HPI Shannon Knapp is a 32 y.o. male.  Patient is a 32 year old male with history of asthma, chronic bronchitis presenting for evaluation of shortness of breath and cough.  This started 2 days ago and is rapidly worsening.  He reports cough productive of yellow sputum.  He denies chest pain.  He denies swelling in his legs, but does admit the pain in his left knee that began earlier today at work.  He was hospitalized in June 2019 for asthma exacerbation with thrush by endoscopy.  The history is provided by the patient.  Shortness of Breath  This is a recurrent problem. The average episode lasts 2 days. The problem occurs continuously.The problem has been rapidly worsening. Associated symptoms include a fever, headaches, cough and sputum production. He has tried nothing for the symptoms. The treatment provided no relief.    Past Medical History:  Diagnosis Date  . Asthma   . Chronic bronchitis (HCC)   . COPD (chronic obstructive pulmonary disease) [facility])     Patient Active Problem List   Diagnosis Date Noted  . Upper airway cough syndrome   . Hypoxemia   . Syncope   . Allergic rhinitis   . Cough   . Hemoptysis   . Protein-calorie malnutrition, severe 10/20/2017  . Acute respiratory failure with hypoxemia (HCC) 10/20/2017  . Acute respiratory distress   . Asthma exacerbation   . Status asthmaticus 10/18/2017  . Severe persistent acute asthmatic bronchitis 10/17/2017  . Elevated blood pressure reading without diagnosis of hypertension 10/17/2017  . Respiratory distress 10/17/2017    Past Surgical History:  Procedure Laterality Date  . VIDEO BRONCHOSCOPY Bilateral 10/24/2017   Procedure: VIDEO BRONCHOSCOPY WITHOUT FLUORO;  Surgeon: Alyson Reedy,  MD;  Location: [facility] ENDOSCOPY;  Service: Cardiopulmonary;  Laterality: Bilateral;        Home Medications    Prior to Admission medications   Medication Sig Start Date End Date Taking? Authorizing Provider  ipratropium (ATROVENT HFA) 17 MCG/ACT inhaler Inhale 2 puffs into the lungs every 6 (six) hours as needed for wheezing. 03/16/18   Mannam, Colbert Coyer, MD  ipratropium (ATROVENT) 0.02 % nebulizer solution Take 0.5 mg by nebulization 2 (two) times daily.    [provider]  mometasone-formoterol (DULERA) 100-5 MCG/ACT AERO Inhale 2 puffs into the lungs 2 (two) times daily. 03/16/18   Mannam, Colbert Coyer, MD  mometasone-formoterol (DULERA) 200-5 MCG/ACT AERO Inhale 2 puffs into the lungs 2 (two) times daily. Patient not taking: Reported on 03/16/2018 11/30/17   Meredeth Ide, MD  montelukast (SINGULAIR) 10 MG tablet Take 1 tablet (10 mg total) by mouth at bedtime. 03/16/18   Mannam, Colbert Coyer, MD  predniSONE (DELTASONE) 10 MG tablet Take 1 tablet (10 mg total) by mouth daily with breakfast. 03/16/18   Chilton Greathouse, MD    Family History Family History  Problem Relation Age of Onset  . Hypertension Mother   . Asthma Father   . Hypertension Father     Social History Social History   Tobacco Use  . Smoking status: Current Every Day Smoker    Types: Cigarettes    Last attempt to quit: 09/02/2017    Years since quitting: 0.6  . Smokeless tobacco: Never Used  . Tobacco comment: 4-5 cigarettes  daily  Substance Use Topics  . Alcohol use: Yes    Alcohol/week: 15.0 standard drinks    Types: 15 Cans of beer per week  . Drug use: No     Allergies   Amoxicillin; Fish allergy; Shellfish allergy; and Penicillins   Review of Systems Review of Systems  Constitutional: Positive for fever.  Respiratory: Positive for cough, sputum production and shortness of breath.   Neurological: Positive for headaches.  All other systems reviewed and are negative.    Physical Exam Updated  Vital Signs BP 132/70   Pulse (!) 113   Temp (!) 103 F (39.4 C) (Oral)   Resp 16   SpO2 99%   Physical Exam  Constitutional: He is oriented to person, place, and time. He appears well-developed and well-nourished. No distress.  HENT:  Head: Normocephalic and atraumatic.  Mouth/Throat: Oropharynx is clear and moist.  Neck: Normal range of motion. Neck supple.  Cardiovascular: Normal rate and regular rhythm. Exam reveals no friction rub.  No murmur heard. Pulmonary/Chest: Effort normal. No respiratory distress. He has no wheezes. He has rhonchi. He has no rales.  Abdominal: Soft. Bowel sounds are normal. He exhibits no distension. There is no tenderness.  Musculoskeletal: Normal range of motion. He exhibits no edema.  Neurological: He is alert and oriented to person, place, and time. Coordination normal.  Skin: Skin is warm and dry. He is not diaphoretic.  Nursing note and vitals reviewed.    ED Treatments / Results  Labs (all labs ordered are listed, but only abnormal results are displayed) Labs Reviewed  INFLUENZA PANEL BY PCR (TYPE A & B)  BASIC METABOLIC PANEL  CBC WITH DIFFERENTIAL/PLATELET    EKG EKG Interpretation  Date/Time:  Thursday April 09 2018 21:10:54 EST Ventricular Rate:  114 PR Interval:  144 QRS Duration: 78 QT Interval:  302 QTC Calculation: 416 R Axis:   88 Text Interpretation:  Sinus tachycardia Otherwise normal ECG Confirmed by Geoffery LyonseLo, Lucilla Petrenko ([id number]) on 04/09/2018 9:34:58 PM   Radiology No results found.  Procedures Procedures (including critical care time)  Medications Ordered in ED Medications  sodium chloride 0.9 % bolus 1,000 mL (has no administration in time range)  albuterol (PROVENTIL,VENTOLIN) solution continuous neb (has no administration in time range)  methylPREDNISolone sodium succinate (SOLU-MEDROL) 125 mg/2 mL injection 125 mg (has no administration in time range)  ipratropium (ATROVENT) nebulizer solution 0.5 mg (has no  administration in time range)     Initial Impression / Assessment and Plan / ED Course  I have reviewed the triage vital signs and the nursing notes.  Pertinent labs & imaging results that were available during my care of the patient were reviewed by me and considered in my medical decision making (see chart for details).  Patient with history of asthma with 2 prior hospitalizations in the past year presenting with complaints of fever, wheezing, and difficulty breathing.  He presents here febrile with a temp of 103.  He has wheezing throughout.  He was given duo nebs, steroids, and magnesium, but continues to wheeze.  At this point, I feel as though patient should be admitted for repeat steroids and nebulizer treatments.  I have spoken with Dr. Onalee Huaavid who agrees to admit.  CRITICAL CARE Performed by: Geoffery Lyonsouglas Gaither Biehn Total critical care time: 35 minutes Critical care time was exclusive of separately billable procedures and treating other patients. Critical care was necessary to treat or prevent imminent or life-threatening deterioration. Critical care was time spent personally by me  on the following activities: development of treatment plan with patient and/or surrogate as well as nursing, discussions with consultants, evaluation of patient's response to treatment, examination of patient, obtaining history from patient or surrogate, ordering and performing treatments and interventions, ordering and review of laboratory studies, ordering and review of radiographic studies, pulse oximetry and re-evaluation of patient's condition.   Final Clinical Impressions(s) / ED Diagnoses   Final diagnoses:  None    ED Discharge Orders    None       Geoffery Lyons, MD 04/09/18 Joseph Pierini    Geoffery Lyons, MD 04/09/18 2358

## 2018-04-09 NOTE — Telephone Encounter (Signed)
lmtcb x1 for pt. 

## 2018-04-09 NOTE — ED Notes (Signed)
Respiratory at the bedside to assess pt

## 2018-04-09 NOTE — ED Triage Notes (Signed)
Pt reports sob, fever, LLE knee pain. States dr changed his inhaler and prednisone doses recently.

## 2018-04-10 ENCOUNTER — Encounter (HOSPITAL_COMMUNITY): Payer: Self-pay

## 2018-04-10 ENCOUNTER — Other Ambulatory Visit: Payer: Self-pay

## 2018-04-10 DIAGNOSIS — R509 Fever, unspecified: Secondary | ICD-10-CM

## 2018-04-10 DIAGNOSIS — J4541 Moderate persistent asthma with (acute) exacerbation: Secondary | ICD-10-CM

## 2018-04-10 LAB — INFLUENZA PANEL BY PCR (TYPE A & B)
INFLBPCR: NEGATIVE
Influenza A By PCR: NEGATIVE

## 2018-04-10 LAB — CBC
HCT: 44.5 % (ref 39.0–52.0)
HEMOGLOBIN: 14.6 g/dL (ref 13.0–17.0)
MCH: 29.6 pg (ref 26.0–34.0)
MCHC: 32.8 g/dL (ref 30.0–36.0)
MCV: 90.3 fL (ref 80.0–100.0)
Platelets: 308 10*3/uL (ref 150–400)
RBC: 4.93 MIL/uL (ref 4.22–5.81)
RDW: 12.9 % (ref 11.5–15.5)
WBC: 15.9 10*3/uL — ABNORMAL HIGH (ref 4.0–10.5)
nRBC: 0 % (ref 0.0–0.2)

## 2018-04-10 LAB — BASIC METABOLIC PANEL
Anion gap: 8 (ref 5–15)
BUN: 8 mg/dL (ref 6–20)
CHLORIDE: 103 mmol/L (ref 98–111)
CO2: 24 mmol/L (ref 22–32)
Calcium: 9.4 mg/dL (ref 8.9–10.3)
Creatinine, Ser: 1.16 mg/dL (ref 0.61–1.24)
GFR calc Af Amer: >60 mL/min (ref >60)
GFR calc non Af Amer: >60 mL/min (ref >60)
GLUCOSE: 177 mg/dL — AB (ref 70–99)
POTASSIUM: 5.2 mmol/L — AB (ref 3.5–5.1)
Sodium: 135 mmol/L (ref 135–145)

## 2018-04-10 LAB — RAPID URINE DRUG SCREEN, HOSP PERFORMED
Amphetamines: NOT DETECTED
BARBITURATES: NOT DETECTED
Benzodiazepines: NOT DETECTED
Cocaine: NOT DETECTED
Opiates: NOT DETECTED
TETRAHYDROCANNABINOL: NOT DETECTED

## 2018-04-10 MED ORDER — BENZONATATE 100 MG PO CAPS
100.0000 mg | ORAL_CAPSULE | Freq: Two times a day (BID) | ORAL | Status: DC
  Filled 2018-04-10: qty 1

## 2018-04-10 MED ORDER — BENZONATATE 100 MG PO CAPS: 100.0000 mg | ORAL_CAPSULE | Freq: Two times a day (BID) | ORAL | 0 refills | Status: DC

## 2018-04-10 MED ORDER — MOMETASONE FURO-FORMOTEROL FUM 200-5 MCG/ACT IN AERO: 2.0000 | INHALATION_SPRAY | Freq: Two times a day (BID) | RESPIRATORY_TRACT | 5 refills | Status: DC

## 2018-04-10 MED ORDER — ALBUTEROL SULFATE (2.5 MG/3ML) 0.083% IN NEBU
2.5000 mg | INHALATION_SOLUTION | Freq: Four times a day (QID) | RESPIRATORY_TRACT | Status: DC
  Administered 2018-04-10: 2.5 mg via RESPIRATORY_TRACT
  Filled 2018-04-10: qty 3

## 2018-04-10 MED ORDER — ACETAMINOPHEN 650 MG RE SUPP: 650.0000 mg | Freq: Four times a day (QID) | RECTAL | Status: DC | PRN

## 2018-04-10 MED ORDER — MOMETASONE FURO-FORMOTEROL FUM 200-5 MCG/ACT IN AERO: 2.0000 | INHALATION_SPRAY | Freq: Two times a day (BID) | RESPIRATORY_TRACT | 0 refills | Status: DC

## 2018-04-10 MED ORDER — AZITHROMYCIN 250 MG PO TABS: 250.0000 mg | ORAL_TABLET | Freq: Every day | ORAL | 0 refills | Status: AC

## 2018-04-10 MED ORDER — AZITHROMYCIN 250 MG PO TABS: 250.0000 mg | ORAL_TABLET | Freq: Every day | ORAL | 0 refills | Status: DC

## 2018-04-10 MED ORDER — IBUPROFEN 800 MG PO TABS: 800.0000 mg | ORAL_TABLET | Freq: Four times a day (QID) | ORAL | Status: DC | PRN

## 2018-04-10 MED ORDER — LORATADINE 10 MG PO TABS: 10.0000 mg | ORAL_TABLET | Freq: Every day | ORAL | 0 refills | Status: DC

## 2018-04-10 MED ORDER — SODIUM CHLORIDE 0.9 % IV SOLN: 250.0000 mL | INTRAVENOUS | Status: DC | PRN

## 2018-04-10 MED ORDER — IPRATROPIUM-ALBUTEROL 0.5-2.5 (3) MG/3ML IN SOLN
3.0000 mL | Freq: Four times a day (QID) | RESPIRATORY_TRACT | Status: DC
  Administered 2018-04-10: 3 mL via RESPIRATORY_TRACT
  Filled 2018-04-10: qty 3

## 2018-04-10 MED ORDER — ALBUTEROL SULFATE (2.5 MG/3ML) 0.083% IN NEBU: 2.5000 mg | INHALATION_SOLUTION | RESPIRATORY_TRACT | Status: DC | PRN

## 2018-04-10 MED ORDER — METHYLPREDNISOLONE SODIUM SUCC 125 MG IJ SOLR
80.0000 mg | Freq: Two times a day (BID) | INTRAMUSCULAR | Status: DC
  Administered 2018-04-10: 80 mg via INTRAVENOUS
  Filled 2018-04-10: qty 2

## 2018-04-10 MED ORDER — SODIUM CHLORIDE 0.9% FLUSH
3.0000 mL | Freq: Two times a day (BID) | INTRAVENOUS | Status: DC
  Administered 2018-04-10 (×2): 3 mL via INTRAVENOUS

## 2018-04-10 MED ORDER — SODIUM CHLORIDE 0.9% FLUSH: 3.0000 mL | INTRAVENOUS | Status: DC | PRN

## 2018-04-10 MED ORDER — IPRATROPIUM BROMIDE 0.02 % IN SOLN: 0.5000 mg | Freq: Four times a day (QID) | RESPIRATORY_TRACT | Status: DC

## 2018-04-10 MED ORDER — PREDNISONE 10 MG PO TABS: 10.0000 mg | ORAL_TABLET | Freq: Every day | ORAL | 0 refills | Status: DC

## 2018-04-10 MED ORDER — ALBUTEROL SULFATE (2.5 MG/3ML) 0.083% IN NEBU: 2.5000 mg | INHALATION_SOLUTION | Freq: Three times a day (TID) | RESPIRATORY_TRACT | Status: DC

## 2018-04-10 MED ORDER — MONTELUKAST SODIUM 10 MG PO TABS
10.0000 mg | ORAL_TABLET | Freq: Every day | ORAL | Status: DC
  Administered 2018-04-10: 10 mg via ORAL
  Filled 2018-04-10: qty 1

## 2018-04-10 MED ORDER — ACETAMINOPHEN 325 MG PO TABS: 650.0000 mg | ORAL_TABLET | Freq: Four times a day (QID) | ORAL | Status: DC | PRN

## 2018-04-10 MED ORDER — GUAIFENESIN ER 600 MG PO TB12
1200.0000 mg | ORAL_TABLET | Freq: Every day | ORAL | Status: DC
  Administered 2018-04-10: 1200 mg via ORAL
  Filled 2018-04-10: qty 2

## 2018-04-10 MED ORDER — AZITHROMYCIN 250 MG PO TABS: 250.0000 mg | ORAL_TABLET | Freq: Every day | ORAL | Status: DC

## 2018-04-10 MED ORDER — AZITHROMYCIN 250 MG PO TABS
500.0000 mg | ORAL_TABLET | Freq: Every day | ORAL | Status: AC
  Administered 2018-04-10: 500 mg via ORAL
  Filled 2018-04-10: qty 2

## 2018-04-10 MED ORDER — ALBUTEROL SULFATE (2.5 MG/3ML) 0.083% IN NEBU: 2.5000 mg | INHALATION_SOLUTION | Freq: Four times a day (QID) | RESPIRATORY_TRACT | Status: DC

## 2018-04-10 NOTE — Care Management Note (Addendum)
Case Management Note  Patient Details  Name: Shannon BennettRayshawn M Knapp MRN: [id number] Date of Birth: 03/10/1986  Subjective/Objective:  From home, with fiance, presents with asthma  Ex, has no insurance, no pcp.  NCM [facility] at 9:50, and also informed patient to go to [facility] to get medication at discount price for $4 or $10, he said he does not have any money, informed him to ask for a one time free fill at the clinic.  [facility] would not fill script for patient, state they are getting ready  To close in 15 mins.  NCM assisted patient thru social work [employer] and Match letter for medications.                   Action/Plan: DC home when ready.   Expected Discharge Date:  04/10/18               Expected Discharge Plan:  Home/Self Care  In-House Referral:     Discharge planning Services  CM Consult, Indigent Health Clinic, Follow-up appt scheduled, Medication Assistance  Post Acute Care Choice:    Choice offered to:     DME Arranged:    DME Agency:     HH Arranged:    HH Agency:     Status of Service:  Completed, signed off  If discussed at [employer] Meetings, dates discussed:    Additional Comments:  Shannon Knapp, Shannon Biscoe Clinton, RN 04/10/2018, 12:33 PM

## 2018-04-10 NOTE — Social Work (Signed)
CSW acknowledging consult for access to medications at discharge.  For medication access please consult RN Case Management.   CSW signing off. Please consult if any additional needs arise.  Peretz Thieme H Fabrizio Filip, LCSWA [place] Clinical Social Work [phone]   

## 2018-04-10 NOTE — Telephone Encounter (Addendum)
Called and spoke with pt to follow up on the message from yesterday, 04/09/18 that stated pt was needing dulera 200 samples.  Pt stated to me right after he called our office requesting dulera, his SOB became worse and he ended up going to the hospital. [facility] fever and other symptoms.  Pt stated as long as all went well, they are talking about releasing him from the hospital this afternoon, 04/10/18.  I stated to pt that we now do have samples of dulera 200 that I could place up front for him if he could send someone by the office to pick them up.  Pt stated his fiancee [name] Hesslinger would come by the office to pick the samples up. I gave them our new office address as well as making sure they had our phone number in case she ran into any problems. Samples have been placed up front for pt. I have also called in an Rx of Dulera 200 to pt's pharmacy. Nothing further needed.

## 2018-04-10 NOTE — Addendum Note (Signed)
Addended by: Wyvonne LenzPINION, Cayla Wiegand P on: 04/10/2018 12:26 PM   Modules accepted: Orders

## 2018-04-10 NOTE — H&P (Signed)
History and Physical    CHESKY HEYER ZOX:[id number] DOB: 06-29-1985 DOA: 04/09/2018  PCP: Patient, No Pcp Per  Patient coming from: home  Chief Complaint:  Sob wheezing fever  HPI: Shannon Knapp is a 32 y.o. male with medical history significant of asthma comes in with several days of fever chills, sob and worsening wheezing despite home albuterol.  No n/v/d.   No sick contacts.  No le edema or swelling or chest pain.  Coughing a lot.  Pt referred for admission for fever of 103 in asthma exacerbation.  Denies smoking or vaping.  Review of Systems: As per HPI otherwise 10 point review of systems negative.   Past Medical History:  Diagnosis Date  . Asthma   . Chronic bronchitis (HCC)   . COPD (chronic obstructive pulmonary disease) (HCC)     Past Surgical History:  Procedure Laterality Date  . VIDEO BRONCHOSCOPY Bilateral 10/24/2017   Procedure: VIDEO BRONCHOSCOPY WITHOUT FLUORO;  Surgeon: Alyson Reedy, MD;  Location: [facility] ENDOSCOPY;  Service: Cardiopulmonary;  Laterality: Bilateral;     reports that he has been smoking cigarettes. He has never used smokeless tobacco. He reports that he drinks about 15.0 standard drinks of alcohol per week. He reports that he does not use drugs.  Allergies  Allergen Reactions  . Amoxicillin Hives    Has patient had a PCN reaction causing immediate rash, facial/tongue/throat swelling, SOB or lightheadedness with hypotension: Yes Has patient had a PCN reaction causing severe rash involving mucus membranes or skin necrosis: No Has patient had a PCN reaction that required hospitalization: No Has patient had a PCN reaction occurring within the last 10 years: Yes If all of the above answers are "NO", then may proceed with Cephalosporin use.  . Fish Allergy Swelling  . Shellfish Allergy Swelling  . Penicillins Hives    Has patient had a PCN reaction causing immediate rash, facial/tongue/throat swelling, SOB or lightheadedness with  hypotension: Yes Has patient had a PCN reaction causing severe rash involving mucus membranes or skin necrosis: No Has patient had a PCN reaction that required hospitalization: No Has patient had a PCN reaction occurring within the last 10 years: Yes If all of the above answers are "NO", then may proceed with Cephalosporin use.    Family History  Problem Relation Age of Onset  . Hypertension Mother   . Asthma Father   . Hypertension Father     Prior to Admission medications   Medication Sig Start Date End Date Taking? Authorizing Provider  albuterol (PROVENTIL) (2.5 MG/3ML) 0.083% nebulizer solution Take 2.5 mg by nebulization See admin instructions. Use twice daily and as needed for coughing   Yes [provider]  guaiFENesin (MUCINEX) 600 MG 12 hr tablet Take 1,200 mg by mouth daily.   Yes [provider]  mometasone-formoterol (DULERA) 100-5 MCG/ACT AERO Inhale 2 puffs into the lungs 2 (two) times daily. 03/16/18  Yes Mannam, Praveen, MD  montelukast (SINGULAIR) 10 MG tablet Take 1 tablet (10 mg total) by mouth at bedtime. 03/16/18  Yes Mannam, Praveen, MD  predniSONE (DELTASONE) 10 MG tablet Take 1 tablet (10 mg total) by mouth daily with breakfast. 03/16/18  Yes Mannam, Praveen, MD  ipratropium (ATROVENT HFA) 17 MCG/ACT inhaler Inhale 2 puffs into the lungs every 6 (six) hours as needed for wheezing. Patient not taking: Reported on 04/10/2018 03/16/18   Chilton Greathouse, MD  mometasone-formoterol (DULERA) 200-5 MCG/ACT AERO Inhale 2 puffs into the lungs 2 (two) times daily. Patient  not taking: Reported on 03/16/2018 11/30/17   Meredeth IdeLama, Gagan S, MD    Physical Exam: Vitals:   04/09/18 2309 04/09/18 2315 04/10/18 0002 04/10/18 0015  BP:  119/73 (!) 128/57 121/66  Pulse:  (!) 106 (!) 114 96  Resp:   (!) 24   Temp: (!) 102.6 F (39.2 C)  (!) 100.4 F (38 C)   TempSrc: Oral     SpO2:  94% 99% 97%      Constitutional: NAD, calm, comfortable Vitals:   04/09/18  2309 04/09/18 2315 04/10/18 0002 04/10/18 0015  BP:  119/73 (!) 128/57 121/66  Pulse:  (!) 106 (!) 114 96  Resp:   (!) 24   Temp: (!) 102.6 F (39.2 C)  (!) 100.4 F (38 C)   TempSrc: Oral     SpO2:  94% 99% 97%   Eyes: PERRL, lids and conjunctivae normal ENMT: Mucous membranes are moist. Posterior pharynx clear of any exudate or lesions.Normal dentition.  Neck: normal, supple, no masses, no thyromegaly Respiratory: clear to auscultation bilaterally, diffuse wheezing w fair air movement, no crackles. Normal respiratory effort. No accessory muscle use.  Cardiovascular: Regular rate and rhythm, no murmurs / rubs / gallops. No extremity edema. 2+ pedal pulses. No carotid bruits.  Abdomen: no tenderness, no masses palpated. No hepatosplenomegaly. Bowel sounds positive.  Musculoskeletal: no clubbing / cyanosis. No joint deformity upper and lower extremities. Good ROM, no contractures. Normal muscle tone.  Skin: no rashes, lesions, ulcers. No induration Neurologic: CN 2-12 grossly intact. Sensation intact, DTR normal. Strength 5/5 in all 4.  Psychiatric: Normal judgment and insight. Alert and oriented x 3. Normal mood.    Labs on Admission: I have personally reviewed following labs and imaging studies  CBC: Recent Labs  Lab 04/09/18 2146  WBC 13.6*  NEUTROABS 10.1*  HGB 14.2  HCT 43.7  MCV 90.9  PLT 304   Basic Metabolic Panel: Recent Labs  Lab 04/09/18 2146  NA 135  K 3.7  CL 101  CO2 23  GLUCOSE 86  BUN 12  CREATININE 0.96  CALCIUM 9.3   GFR: CrCl cannot be calculated (Unknown ideal weight.). Liver Function Tests: No results for input(s): AST, ALT, ALKPHOS, BILITOT, PROT, ALBUMIN in the last 168 hours. No results for input(s): LIPASE, AMYLASE in the last 168 hours. No results for input(s): AMMONIA in the last 168 hours. Coagulation Profile: No results for input(s): INR, PROTIME in the last 168 hours. Cardiac Enzymes: No results for input(s): CKTOTAL, CKMB,  CKMBINDEX, TROPONINI in the last 168 hours. BNP (last 3 results) No results for input(s): PROBNP in the last 8760 hours. HbA1C: No results for input(s): HGBA1C in the last 72 hours. CBG: No results for input(s): GLUCAP in the last 168 hours. Lipid Profile: No results for input(s): CHOL, HDL, LDLCALC, TRIG, CHOLHDL, LDLDIRECT in the last 72 hours. Thyroid Function Tests: No results for input(s): TSH, T4TOTAL, FREET4, T3FREE, THYROIDAB in the last 72 hours. Anemia Panel: No results for input(s): VITAMINB12, FOLATE, FERRITIN, TIBC, IRON, RETICCTPCT in the last 72 hours. Urine analysis:    Component Value Date/Time   COLORURINE STRAW (A) 10/18/2017 0751   APPEARANCEUR CLEAR 10/18/2017 0751   LABSPEC 1.006 10/18/2017 0751   PHURINE 6.0 10/18/2017 0751   GLUCOSEU 50 (A) 10/18/2017 0751   HGBUR NEGATIVE 10/18/2017 0751   BILIRUBINUR NEGATIVE 10/18/2017 0751   KETONESUR NEGATIVE 10/18/2017 0751   PROTEINUR NEGATIVE 10/18/2017 0751   NITRITE NEGATIVE 10/18/2017 0751   LEUKOCYTESUR NEGATIVE 10/18/2017 0751  Sepsis Labs: !!!!!!!!!!!!!!!!!!!!!!!!!!!!!!!!!!!!!!!!!!!! @LABRCNTIP (procalcitonin:4,lacticidven:4) )No results found for this or any previous visit (from the past 240 hour(s)).   Radiological Exams on Admission: Dg Chest 2 View  Result Date: 04/09/2018 CLINICAL DATA:  Cough and fever x2 days. EXAM: CHEST - 2 VIEW COMPARISON:  11/28/2017 FINDINGS: The heart size and mediastinal contours are within normal limits. Pulmonary hyperinflation. No acute pulmonary consolidation, CHF, effusion or pneumothorax. The visualized skeletal structures are unremarkable. IMPRESSION: No active cardiopulmonary disease.  Mild pulmonary hyperinflation. Electronically Signed   By: Tollie Eth M.D.   On: 04/09/2018 22:45    Old chart reviewed Case discussed with dr long in the ed cxr reviewed no edema or focal infiltrate seen  Assessment/Plan 32 yo male with febrile illness and asthma  exacerbation  Principal Problem:   Asthma exacerbation- solumedrol, freq nebs.  Azithro.  Ck flu.    Active Problems:   Acute febrile illness- ck flu, f/u on blood cx  Ck uds  DVT prophylaxis: scds Code Status: full Family Communication: none Disposition Plan: tomorrow Consults called: none Admission status: observation   Keyaira Clapham A MD [facility]  If 7PM-7AM, please contact night-coverage [email or web address] Password [facility]  04/10/2018, 12:47 AM

## 2018-04-10 NOTE — Plan of Care (Signed)
Discussed plan of care for the evening with patient.   Emphasized using the call bell when assistance is needed.  Also stressed grouping activities together to reserve as much energy as possible

## 2018-04-11 NOTE — Discharge Summary (Signed)
[facility] Discharge Summary   Patient: Shannon Knapp WUJ:[id number]   PCP: Patient, No Pcp Per DOB: [birth year]   Date of admission: 04/09/2018   Date of discharge: 04/10/2018    Discharge Diagnoses:   Principal Problem:   Asthma exacerbation Active Problems:   Acute febrile illness   Admitted From: home Disposition:  home  Recommendations for Outpatient Follow-up:  1. Please follow up with PCP  2. Patient will need a referral to allergy specialist for an ENT for his chronic rhinitis  Follow-up Information    PCP. Schedule an appointment as soon as possible for a visit in 1 week(s).   Why:  will need a referral to allergy specialist.        [facility] Follow up on 04/21/2018.   Why:  9:50 for hospital follow up, will need rererral for alleryg specialist. Contact information: [address] [place] [address]       [facility] Follow up.   Why:  you can use this pharmacy for discounted medications Contact information: [address] [facility] [address] [phone]         Diet recommendation: Regular diet  Activity: The patient is advised to gradually reintroduce usual activities.  Discharge Condition: good  Code Status: Full code  History of present illness: As per the H and P dictated on admission, "Shannon Knapp is a 32 y.o. male with medical history significant of asthma comes in with several days of fever chills, sob and worsening wheezing despite home albuterol.  No n/v/d.   No sick contacts.  No le edema or swelling or chest pain.  Coughing a lot.  Pt referred for admission for fever of 103 in asthma exacerbation.  Denies smoking or vaping."  Hospital Course:  Summary of his active problems in the hospital is as following. Asthma exacerbation Chronic rhinitis. Suspect acute viral illness. Patient has chronic rhinitis symptoms now  comes to the hospital with fever and fatigue and shortness of breath. Influenza PCR negative. Do not suspect that the patient has any bacterial infection. We will continue steroids, continue inhalers at home. On room air both at rest as well as on exertion. No wheezing on examination. Feel that the patient can be stable for discharge home with follow-up with PCP and a referral to allergy specialist. All other chronic medical condition were stable during the hospitalization.  Patient was ambulatory without any assistance.  Consultants: none Procedures: none  DISCHARGE MEDICATION: Allergies as of 04/10/2018      Reactions   Amoxicillin Hives   Has patient had a PCN reaction causing immediate rash, facial/tongue/throat swelling, SOB or lightheadedness with hypotension: Yes Has patient had a PCN reaction causing severe rash involving mucus membranes or skin necrosis: No Has patient had a PCN reaction that required hospitalization: No Has patient had a PCN reaction occurring within the last 10 years: Yes If all of the above answers are "NO", then may proceed with Cephalosporin use.   Fish Allergy Swelling   Shellfish Allergy Swelling   Penicillins Hives   Has patient had a PCN reaction causing immediate rash, facial/tongue/throat swelling, SOB or lightheadedness with hypotension: Yes Has patient had a PCN reaction causing severe rash involving mucus membranes or skin necrosis: No Has patient had a PCN reaction that required hospitalization: No Has patient had a PCN reaction occurring within the last 10 years: Yes If all of the above answers are "NO",  then may proceed with Cephalosporin use.      Medication List    TAKE these medications   albuterol (2.5 MG/3ML) 0.083% nebulizer solution Commonly known as:  PROVENTIL Take 2.5 mg by nebulization See admin instructions. Use twice daily and as needed for coughing   azithromycin 250 MG tablet Commonly known as:  ZITHROMAX Take 1 tablet  (250 mg total) by mouth daily for 3 days.   benzonatate 100 MG capsule Commonly known as:  TESSALON Take 1 capsule (100 mg total) by mouth 2 (two) times daily.   guaiFENesin 600 MG 12 hr tablet Commonly known as:  MUCINEX Take 1,200 mg by mouth daily.   ipratropium 17 MCG/ACT inhaler Commonly known as:  ATROVENT HFA Inhale 2 puffs into the lungs every 6 (six) hours as needed for wheezing.   loratadine 10 MG tablet Commonly known as:  CLARITIN Take 1 tablet (10 mg total) by mouth daily.   mometasone-formoterol 200-5 MCG/ACT Aero Commonly known as:  DULERA Inhale 2 puffs into the lungs 2 (two) times daily. What changed:  Another medication with the same name was removed. Continue taking this medication, and follow the directions you see here.   montelukast 10 MG tablet Commonly known as:  SINGULAIR Take 1 tablet (10 mg total) by mouth at bedtime.   predniSONE 10 MG tablet Commonly known as:  DELTASONE Take 1 tablet (10 mg total) by mouth daily with breakfast. Take 50mg  daily for 3days,Take 40mg  daily for 3days,Take 30mg  daily for 3days,Take 20mg  daily for 3days,Take 10mg  daily. What changed:  additional instructions      Allergies  Allergen Reactions  . Amoxicillin Hives    Has patient had a PCN reaction causing immediate rash, facial/tongue/throat swelling, SOB or lightheadedness with hypotension: Yes Has patient had a PCN reaction causing severe rash involving mucus membranes or skin necrosis: No Has patient had a PCN reaction that required hospitalization: No Has patient had a PCN reaction occurring within the last 10 years: Yes If all of the above answers are "NO", then may proceed with Cephalosporin use.  . Fish Allergy Swelling  . Shellfish Allergy Swelling  . Penicillins Hives    Has patient had a PCN reaction causing immediate rash, facial/tongue/throat swelling, SOB or lightheadedness with hypotension: Yes Has patient had a PCN reaction causing severe rash  involving mucus membranes or skin necrosis: No Has patient had a PCN reaction that required hospitalization: No Has patient had a PCN reaction occurring within the last 10 years: Yes If all of the above answers are "NO", then may proceed with Cephalosporin use.   Discharge Instructions    Diet - low sodium heart healthy   Complete by:  As directed    Discharge instructions   Complete by:  As directed    It is important that you read following instructions as well as go over your medication list with RN to help you understand your care after this hospitalization.  Discharge Instructions: Please follow-up with PCP in one week  Please request your primary care physician to go over all Hospital Tests and Procedure/Radiological results at the follow up,  Please get all Hospital records sent to your PCP by signing hospital release before you go home.   Do not take more than prescribed Pain, Sleep and Anxiety Medications. You were cared for by a hospitalist during your hospital stay. If you have any questions about your discharge medications or the care you received while you were in the hospital after you  are discharged, you can call the unit you were admitted to and ask to speak with the hospitalist on call if the hospitalist that took care of you is not available.  Once you are discharged, your primary care physician will handle any further medical issues. Please note that NO REFILLS for any discharge medications will be authorized once you are discharged, as it is imperative that you return to your primary care physician (or establish a relationship with a primary care physician if you do not have one) for your aftercare needs so that they can reassess your need for medications and monitor your lab values. You Must read complete instructions/literature along with all the possible adverse reactions/side effects for all the Medicines you take and that have been prescribed to you. Take any new  Medicines after you have completely understood and accept all the possible adverse reactions/side effects. Wear Seat belts while driving. If you have smoked or chewed Tobacco in the last 2 yrs please stop smoking and/or stop any Recreational drug use.   Increase activity slowly   Complete by:  As directed      Discharge Exam: Filed Weights   04/10/18 0059  Weight: 72.2 kg   Vitals:   04/10/18 1150 04/10/18 1628  BP:  112/75  Pulse: 70 72  Resp: 18   Temp:  97.9 F (36.6 C)  SpO2: 95% 98%   General: Appear in no distress, no Rash; Oral Mucosa moist. Cardiovascular: S1 and S2 Present, no Murmur, no JVD Respiratory: Bilateral Air entry present and Clear to Auscultation, no Crackles, no wheezes Abdomen: Bowel Sound present, Soft and no tenderness Extremities: no Pedal edema, no calf tenderness Neurology: Grossly no focal neuro deficit.  The results of significant diagnostics from this hospitalization (including imaging, microbiology, ancillary and laboratory) are listed below for reference.    Significant Diagnostic Studies: Dg Chest 2 View  Result Date: 04/09/2018 CLINICAL DATA:  Cough and fever x2 days. EXAM: CHEST - 2 VIEW COMPARISON:  11/28/2017 FINDINGS: The heart size and mediastinal contours are within normal limits. Pulmonary hyperinflation. No acute pulmonary consolidation, CHF, effusion or pneumothorax. The visualized skeletal structures are unremarkable. IMPRESSION: No active cardiopulmonary disease.  Mild pulmonary hyperinflation. Electronically Signed   By: Tollie Ethavid  Kwon M.D.   On: 04/09/2018 22:45    Microbiology: No results found for this or any previous visit (from the past 240 hour(s)).   Labs: CBC: Recent Labs  Lab 04/09/18 2146 04/10/18 0255  WBC 13.6* 15.9*  NEUTROABS 10.1*  --   HGB 14.2 14.6  HCT 43.7 44.5  MCV 90.9 90.3  PLT 304 308   Basic Metabolic Panel: Recent Labs  Lab 04/09/18 2146 04/10/18 0255  NA 135 135  K 3.7 5.2*  CL 101 103    CO2 23 24  GLUCOSE 86 177*  BUN 12 8  CREATININE 0.96 1.16  CALCIUM 9.3 9.4   Liver Function Tests: No results for input(s): AST, ALT, ALKPHOS, BILITOT, PROT, ALBUMIN in the last 168 hours. No results for input(s): LIPASE, AMYLASE in the last 168 hours. No results for input(s): AMMONIA in the last 168 hours. Cardiac Enzymes: No results for input(s): CKTOTAL, CKMB, CKMBINDEX, TROPONINI in the last 168 hours. BNP (last 3 results) No results for input(s): BNP in the last 8760 hours. CBG: No results for input(s): GLUCAP in the last 168 hours. Time spent: 35 minutes  Signed:  Lynden OxfordPranav Kinleigh Nault  [facility] 04/10/2018 , 6:04 PM

## 2018-04-21 ENCOUNTER — Inpatient Hospital Stay: Payer: Self-pay | Admitting: Family Medicine

## 2018-04-22 ENCOUNTER — Ambulatory Visit: Payer: Self-pay | Admitting: Pulmonary Disease

## 2018-04-30 ENCOUNTER — Inpatient Hospital Stay (INDEPENDENT_AMBULATORY_CARE_PROVIDER_SITE_OTHER): Payer: Self-pay | Admitting: Physician Assistant

## 2018-05-26 ENCOUNTER — Other Ambulatory Visit: Payer: Self-pay

## 2018-05-26 ENCOUNTER — Emergency Department (HOSPITAL_COMMUNITY)
Admission: EM | Admit: 2018-05-26 | Discharge: 2018-05-26 | Disposition: A | Discharge status: Home / Self Care | Payer: Self-pay | Attending: Emergency Medicine | Admitting: Emergency Medicine

## 2018-05-26 ENCOUNTER — Encounter (HOSPITAL_COMMUNITY): Payer: Self-pay | Admitting: Emergency Medicine

## 2018-05-26 DIAGNOSIS — F17210 Nicotine dependence, cigarettes, uncomplicated: Secondary | ICD-10-CM | POA: Insufficient documentation

## 2018-05-26 DIAGNOSIS — J449 Chronic obstructive pulmonary disease, unspecified: Secondary | ICD-10-CM | POA: Insufficient documentation

## 2018-05-26 DIAGNOSIS — M545 Low back pain, unspecified: Secondary | ICD-10-CM

## 2018-05-26 DIAGNOSIS — Z79899 Other long term (current) drug therapy: Secondary | ICD-10-CM | POA: Insufficient documentation

## 2018-05-26 MED ORDER — OXYCODONE-ACETAMINOPHEN 5-325 MG PO TABS: 1.0000 | ORAL_TABLET | Freq: Four times a day (QID) | ORAL | 0 refills | Status: DC | PRN

## 2018-05-26 MED ORDER — OXYCODONE-ACETAMINOPHEN 5-325 MG PO TABS
1.0000 | ORAL_TABLET | Freq: Once | ORAL | Status: AC
  Administered 2018-05-26: 1 via ORAL
  Filled 2018-05-26: qty 1

## 2018-05-26 NOTE — Discharge Instructions (Addendum)
Please read attached information. If you experience any new or worsening signs or symptoms please return to the emergency room for evaluation. Please follow-up with your primary care provider or specialist as discussed. Please use medication prescribed only as directed and discontinue taking if you have any concerning signs or symptoms.   °

## 2018-05-26 NOTE — ED Triage Notes (Signed)
Pt with lumbosacral pain after bending over, while coughing. Denies radiation down the legs or loss of control of bowls or bladder. Ambulatory with slow gait.

## 2018-05-26 NOTE — ED Provider Notes (Signed)
[facility] EMERGENCY DEPARTMENT Provider Note   CSN: [id number] Arrival date & time: 05/26/18  4098   History   Chief Complaint Chief Complaint  Patient presents with  . Back Pain    HPI Shannon Knapp is a 33 y.o. male.  HPI   33 year old male presents today with acute onset low back pain.  Patient reports yesterday he was bending over when he coughed.  He notes a sharp pain in the bilateral lower back.  He denies any radiation to his lower extremities no distal neurological deficits.  Patient denies any fever or urinary changes.  No history of the same, no other trauma.  No medications prior to arrival.  Past Medical History:  Diagnosis Date  . Asthma   . Chronic bronchitis (HCC)   . COPD (chronic obstructive pulmonary disease) [facility])     Patient Active Problem List   Diagnosis Date Noted  . Acute febrile illness 04/10/2018  . Upper airway cough syndrome   . Hypoxemia   . Syncope   . Allergic rhinitis   . Cough   . Hemoptysis   . Protein-calorie malnutrition, severe 10/20/2017  . Acute respiratory failure with hypoxemia (HCC) 10/20/2017  . Acute respiratory distress   . Asthma exacerbation   . Status asthmaticus 10/18/2017  . Severe persistent acute asthmatic bronchitis 10/17/2017  . Elevated blood pressure reading without diagnosis of hypertension 10/17/2017  . Respiratory distress 10/17/2017    Past Surgical History:  Procedure Laterality Date  . VIDEO BRONCHOSCOPY Bilateral 10/24/2017   Procedure: VIDEO BRONCHOSCOPY WITHOUT FLUORO;  Surgeon: Alyson Reedy, MD;  Location: [facility] ENDOSCOPY;  Service: Cardiopulmonary;  Laterality: Bilateral;      Home Medications    Prior to Admission medications   Medication Sig Start Date End Date Taking? Authorizing Provider  albuterol (PROVENTIL) (2.5 MG/3ML) 0.083% nebulizer solution Take 2.5 mg by nebulization See admin instructions. Use twice daily and as needed for coughing    [provider]  benzonatate (TESSALON) 100 MG capsule Take 1 capsule (100 mg total) by mouth 2 (two) times daily. 04/10/18   Rolly Salter, MD  guaiFENesin (MUCINEX) 600 MG 12 hr tablet Take 1,200 mg by mouth daily.    [provider]  loratadine (CLARITIN) 10 MG tablet Take 1 tablet (10 mg total) by mouth daily. 04/10/18   Rolly Salter, MD  mometasone-formoterol [facility]) 200-5 MCG/ACT AERO Inhale 2 puffs into the lungs 2 (two) times daily. 04/10/18   Rolly Salter, MD  montelukast (SINGULAIR) 10 MG tablet Take 1 tablet (10 mg total) by mouth at bedtime. 03/16/18   Mannam, Colbert Coyer, MD  oxyCODONE-acetaminophen (PERCOCET/ROXICET) 5-325 MG tablet Take 1 tablet by mouth every 6 (six) hours as needed for severe pain. 05/26/18   Juanjesus Pepperman, Tinnie Gens, PA-C  predniSONE (DELTASONE) 10 MG tablet Take 1 tablet (10 mg total) by mouth daily with breakfast. Take 50mg  daily for 3days,Take 40mg  daily for 3days,Take 30mg  daily for 3days,Take 20mg  daily for 3days,Take 10mg  daily. 04/10/18   Rolly Salter, MD    Family History Family History  Problem Relation Age of Onset  . Hypertension Mother   . Asthma Father   . Hypertension Father     Social History Social History   Tobacco Use  . Smoking status: Current Every Day Smoker    Types: Cigarettes    Last attempt to quit: 09/02/2017    Years since quitting: 0.7  . Smokeless tobacco: Never Used  . Tobacco comment:  4-5 cigarettes daily  Substance Use Topics  . Alcohol use: Yes    Alcohol/week: 15.0 standard drinks    Types: 15 Cans of beer per week  . Drug use: No     Allergies   Amoxicillin; Fish allergy; Shellfish allergy; and Penicillins   Review of Systems Review of Systems  All other systems reviewed and are negative.    Physical Exam Updated Vital Signs BP 114/72 (BP Location: Right Arm)   Pulse 99   Temp 99.5 F (37.5 C) (Oral)   Resp 16   SpO2 98%   Physical Exam Vitals signs and nursing note reviewed.  Constitutional:       Appearance: He is well-developed.  HENT:     Head: Normocephalic and atraumatic.  Eyes:     General: No scleral icterus.       Right eye: No discharge.        Left eye: No discharge.     Conjunctiva/sclera: Conjunctivae normal.     Pupils: Pupils are equal, round, and reactive to light.  Neck:     Musculoskeletal: Normal range of motion.     Vascular: No JVD.     Trachea: No tracheal deviation.  Pulmonary:     Effort: Pulmonary effort is normal.     Breath sounds: No stridor.  Musculoskeletal:     Comments: Tenderness palpation of bilateral lower lumbar soft tissue and midline back, no rashes erythema or warmth to touch bilateral lower extremity sensation strength and motor function intact, straight leg negative but produces pain to the lower lumbar region bilateral   Neurological:     Mental Status: He is alert and oriented to person, place, and time.     Coordination: Coordination normal.  Psychiatric:        Behavior: Behavior normal.        Thought Content: Thought content normal.        Judgment: Judgment normal.      ED Treatments / Results  Labs (all labs ordered are listed, but only abnormal results are displayed) Labs Reviewed - No data to display  EKG None  Radiology No results found.  Procedures Procedures (including critical care time)  Medications Ordered in ED Medications  oxyCODONE-acetaminophen (PERCOCET/ROXICET) 5-325 MG per tablet 1 tablet (1 tablet Oral Given 05/26/18 1101)     Initial Impression / Assessment and Plan / ED Course  I have reviewed the triage vital signs and the nursing notes.  Pertinent labs & imaging results that were available during my care of the patient were reviewed by me and considered in my medical decision making (see chart for details).     Assessment/Plan: 33 year old male presents today with back pain.  No traumatic injury although he was bending over at the time.  Suspicion for disc versus muscular pathology.  He  has no neurological deficits or red flags here.  Patient discharged with pain medication return precautions and outpatient follow-up.  He verbalized understanding and agreement to today's plan had no further questions or concerns    Final Clinical Impressions(s) / ED Diagnoses   Final diagnoses:  Acute bilateral low back pain without sciatica    ED Discharge Orders         Ordered    oxyCODONE-acetaminophen (PERCOCET/ROXICET) 5-325 MG tablet  Every 6 hours PRN,   Status:  Discontinued     05/26/18 1140    oxyCODONE-acetaminophen (PERCOCET/ROXICET) 5-325 MG tablet  Every 6 hours PRN     05/26/18 1141  Eyvonne MechanicHedges, Trevionne Advani, PA-C 05/27/18 [id number]    Pricilla LovelessGoldston, Scott, MD 05/30/18 [phone]

## 2018-05-27 ENCOUNTER — Telehealth: Payer: Self-pay | Admitting: Pulmonary Disease

## 2018-05-27 NOTE — Telephone Encounter (Signed)
Pt last seen by Dr. Isaiah Serge 03/16/18 and told to f/u 3 months from that appt.  Pt has either cancelled or no showed for multiple appts. Pt will need to schedule an appt in order to receive meds.  Called and spoke with pt stating the above to him. Pt expressed understanding. appt scheduled for pt with Buelah Manis, NP tomorrow, 05/29/2018 at 10am. Nothing further needed.

## 2018-05-28 ENCOUNTER — Encounter: Payer: Self-pay | Admitting: Nurse Practitioner

## 2018-05-28 ENCOUNTER — Ambulatory Visit (INDEPENDENT_AMBULATORY_CARE_PROVIDER_SITE_OTHER): Payer: Self-pay | Admitting: Nurse Practitioner

## 2018-05-28 ENCOUNTER — Ambulatory Visit: Payer: Self-pay | Admitting: Primary Care

## 2018-05-28 VITALS — BP 118/70 | HR 85 | Temp 99.0°F | Ht 71.0 in | Wt 160.0 lb

## 2018-05-28 DIAGNOSIS — R059 Cough, unspecified: Secondary | ICD-10-CM

## 2018-05-28 DIAGNOSIS — R05 Cough: Secondary | ICD-10-CM

## 2018-05-28 DIAGNOSIS — R509 Fever, unspecified: Secondary | ICD-10-CM

## 2018-05-28 DIAGNOSIS — J4550 Severe persistent asthma, uncomplicated: Secondary | ICD-10-CM

## 2018-05-28 LAB — POCT INFLUENZA A/B
INFLUENZA A, POC: NEGATIVE
Influenza B, POC: NEGATIVE

## 2018-05-28 MED ORDER — ALBUTEROL SULFATE (2.5 MG/3ML) 0.083% IN NEBU: 2.5000 mg | INHALATION_SOLUTION | RESPIRATORY_TRACT | 0 refills | Status: DC

## 2018-05-28 MED ORDER — MOMETASONE FURO-FORMOTEROL FUM 200-5 MCG/ACT IN AERO: 2.0000 | INHALATION_SPRAY | Freq: Two times a day (BID) | RESPIRATORY_TRACT | 0 refills | Status: DC

## 2018-05-28 MED ORDER — BENZONATATE 100 MG PO CAPS: 100.0000 mg | ORAL_CAPSULE | Freq: Two times a day (BID) | ORAL | 0 refills | Status: DC

## 2018-05-28 NOTE — Assessment & Plan Note (Addendum)
Patient does not have insurance and frequently resume medications.  He is unable to afford Singulair.  He is working at [employer] and will have insurance soon.  For now continue [facility] will give samples today.  Reorder [employer].  It is great that he has quit smoking.  Patient Instructions  Congratulations on quitting smoking!!!! Flu swab in office today was negative Sample of Dulera given in office today Will reorder tessalon Perles Will reorder Proventil neb treatments Follow up with Dr. Isaiah Serge in 3 months or sooner if needed

## 2018-05-28 NOTE — Progress Notes (Signed)
 @Patient  ID: Shannon Knapp, male    DOB: 02/28/1986, 33 y.o.   MRN: [id number]  Chief Complaint  Patient presents with  . Fever    with cough and congestion    Referring provider: No ref. provider found  HPI 33 year old male former smoker history of chronic bronchitis and asthma followed by Dr. Isaiah SergeMannam.  Tests:  Imaging CT chest 10/22/2017- bronchial wall thickening, dependent atelectasis in the lower lobe. CT abdomen pelvis 10/27/2017- no acute intra-abdominal process. Chest x-ray 11/28/2017-hyperinflated lungs, no acute abnormality I have reviewed the images personally.  FENO  10/17/2017-95 12/02/2017-54  ACQ6 03/16/18- 3  Labs CBC 11/28/2017-WBC 10.1, eos 14%, absolute eosinophil count 1400 IgE 10/22/2017-2209  PFT:  PFT Results Latest Ref Rng & Units 10/29/2017  FVC-Pre L 5.81  FVC-Predicted Pre % 124  Pre FEV1/FVC % % 41  FEV1-Pre L 2.38  FEV1-Predicted Pre % 61  DLCO UNC% % 104  DLCO COR %Predicted % 97  TLC L 11.87  TLC % Predicted % 166  RV % Predicted % 360   OV 05/28/18 - cough, congestion Patient presents today with cough and chest congestion.  States that his normal medication regimen has been working well for him.  He takes Dulera, [employer] Petroleum[employer]alon Perles, Proventil, and prednisone.  He has currently ran out of his [place] and [employer] Petroleum[employer]alon Perles.  He states that this has caused increased cough and chest congestion.  He did have a low-grade fever in office today.  Flu swab in office today was negative.  Patient states that he has quit smoking.  He last saw Dr. Isaiah SergeMannam in October 2019 and stopped smoking after that visit.  He states that he has been doing well until he ran out of these medications.  He denies any sinus congestion, shortness of breath, chest pain, or edema.  Allergies  Allergen Reactions  . Amoxicillin Hives    Has patient had a PCN reaction causing immediate rash, facial/tongue/throat swelling, SOB or lightheadedness with hypotension: Yes Has patient  had a PCN reaction causing severe rash involving mucus membranes or skin necrosis: No Has patient had a PCN reaction that required hospitalization: No Has patient had a PCN reaction occurring within the last 10 years: Yes If all of the above answers are "NO", then may proceed with Cephalosporin use.  . Fish Allergy Swelling  . Shellfish Allergy Swelling  . Penicillins Hives    Has patient had a PCN reaction causing immediate rash, facial/tongue/throat swelling, SOB or lightheadedness with hypotension: Yes Has patient had a PCN reaction causing severe rash involving mucus membranes or skin necrosis: No Has patient had a PCN reaction that required hospitalization: No Has patient had a PCN reaction occurring within the last 10 years: Yes If all of the above answers are "NO", then may proceed with Cephalosporin use.    Immunization History  Administered Date(s) Administered  . Pneumococcal Polysaccharide-23 10/19/2017    Past Medical History:  Diagnosis Date  . Asthma   . Chronic bronchitis (HCC)   . COPD (chronic obstructive pulmonary disease) (HCC)     Tobacco History: Social History   Tobacco Use  Smoking Status Current Every Day Smoker  . Types: Cigarettes  . Last attempt to quit: 09/02/2017  . Years since quitting: 0.7  Smokeless Tobacco Never Used  Tobacco Comment   4-5 cigarettes daily   Ready to quit: Not Answered Counseling given: Not Answered Comment: 4-5 cigarettes daily   Outpatient Encounter Medications as of 05/28/2018  Medication Sig  .  albuterol (PROVENTIL) (2.5 MG/3ML) 0.083% nebulizer solution Take 3 mLs (2.5 mg total) by nebulization See admin instructions. Use twice daily and as needed for coughing  . benzonatate (TESSALON) 100 MG capsule Take 1 capsule (100 mg total) by mouth 2 (two) times daily.  Marland Kitchen guaiFENesin (MUCINEX) 600 MG 12 hr tablet Take 1,200 mg by mouth daily.  Marland Kitchen loratadine (CLARITIN) 10 MG tablet Take 1 tablet (10 mg total) by mouth daily.  .  mometasone-formoterol (DULERA) 200-5 MCG/ACT AERO Inhale 2 puffs into the lungs 2 (two) times daily.  . montelukast (SINGULAIR) 10 MG tablet Take 1 tablet (10 mg total) by mouth at bedtime.  Marland Kitchen oxyCODONE-acetaminophen (PERCOCET/ROXICET) 5-325 MG tablet Take 1 tablet by mouth every 6 (six) hours as needed for severe pain.  . predniSONE (DELTASONE) 10 MG tablet Take 1 tablet (10 mg total) by mouth daily with breakfast. Take 50mg  daily for 3days,Take 40mg  daily for 3days,Take 30mg  daily for 3days,Take 20mg  daily for 3days,Take 10mg  daily.  . [DISCONTINUED] albuterol (PROVENTIL) (2.5 MG/3ML) 0.083% nebulizer solution Take 2.5 mg by nebulization See admin instructions. Use twice daily and as needed for coughing  . [DISCONTINUED] benzonatate (TESSALON) 100 MG capsule Take 1 capsule (100 mg total) by mouth 2 (two) times daily.  . mometasone-formoterol (DULERA) 200-5 MCG/ACT AERO Inhale 2 puffs into the lungs 2 (two) times daily.   No facility-administered encounter medications on file as of 05/28/2018.      Review of Systems  Review of Systems  Constitutional: Negative.  Negative for chills and fever.  HENT: Negative.   Respiratory: Positive for cough. Negative for shortness of breath and wheezing.   Cardiovascular: Negative.  Negative for chest pain, palpitations and leg swelling.  Gastrointestinal: Negative.   Allergic/Immunologic: Negative.   Neurological: Negative.   Psychiatric/Behavioral: Negative.        Physical Exam  BP 118/70 (BP Location: Left Arm, Patient Position: Sitting, Cuff Size: Normal)   Pulse 85   Temp 99 F (37.2 C)   Ht 5\' 11"  (1.803 m)   Wt 160 lb (72.6 kg)   SpO2 99%   BMI 22.32 kg/m   Wt Readings from Last 5 Encounters:  05/28/18 160 lb (72.6 kg)  04/10/18 159 lb 2.8 oz (72.2 kg)  03/16/18 164 lb (74.4 kg)  12/02/17 164 lb 6.4 oz (74.6 kg)  11/30/17 157 lb 14.4 oz (71.6 kg)     Physical Exam Vitals signs and nursing note reviewed.  Constitutional:       General: He is not in acute distress.    Appearance: He is well-developed.  Cardiovascular:     Rate and Rhythm: Normal rate and regular rhythm.  Pulmonary:     Effort: Pulmonary effort is normal. No respiratory distress.     Breath sounds: Normal breath sounds. No wheezing or rhonchi.  Skin:    General: Skin is warm and dry.  Neurological:     Mental Status: He is alert and oriented to person, place, and time.       Assessment & Plan:   Severe persistent asthma Patient does not have insurance and frequently resume medications.  He is unable to afford Singulair.  He is working at [employer] and will have insurance soon.  For now continue [facility] will give samples today.  Reorder [employer].  It is great that he has quit smoking.  Patient Instructions  Congratulations on quitting smoking!!!! Flu swab in office today was negative Sample of Dulera given in office today Will reorder tessalon  Perles Will reorder Proventil neb treatments Follow up with Dr. Isaiah Serge in 3 months or sooner if needed       Ivonne Andrew, NP 05/28/2018

## 2018-05-28 NOTE — Patient Instructions (Addendum)
Congratulations on quitting smoking!!!! Flu swab in office today was negative Sample of Dulera given in office today Will reorder tessalon Perles Will reorder Proventil neb treatments Follow up with Dr. Isaiah Serge in 3 months or sooner if needed

## 2018-06-16 ENCOUNTER — Ambulatory Visit: Payer: Self-pay | Admitting: Pulmonary Disease

## 2018-06-25 ENCOUNTER — Telehealth: Payer: Self-pay | Admitting: Pulmonary Disease

## 2018-06-25 MED ORDER — MONTELUKAST SODIUM 10 MG PO TABS: 10.0000 mg | ORAL_TABLET | Freq: Every day | ORAL | 3 refills | Status: DC

## 2018-06-25 MED ORDER — MOMETASONE FURO-FORMOTEROL FUM 200-5 MCG/ACT IN AERO: 2.0000 | INHALATION_SPRAY | Freq: Two times a day (BID) | RESPIRATORY_TRACT | 0 refills | Status: DC

## 2018-06-25 MED ORDER — BENZONATATE 100 MG PO CAPS: 100.0000 mg | ORAL_CAPSULE | Freq: Two times a day (BID) | ORAL | 0 refills | Status: DC

## 2018-06-25 NOTE — Telephone Encounter (Signed)
Spoke with the pt  I advised we can leave a sample of Dulera 200  I advised we do not have samples of the tessalon or singulair  I have sent refills to his pharm for these  Nothing further needed

## 2018-06-25 NOTE — Telephone Encounter (Signed)
Patient called needing samples  Of Delara, sigular and teslon pearls he can be reached at [phone]//fmb

## 2018-07-22 ENCOUNTER — Telehealth: Payer: Self-pay | Admitting: Pulmonary Disease

## 2018-07-22 NOTE — Telephone Encounter (Signed)
Spoke with pt's fiance, [name]. She is requesting samples of Dulera 200. Advised her that we do not have samples at this time. I mentioned patient assistance to her and she is willing to try this. Patient assistance forms have been left up front for pick up per [name]'s request. Nothing further was needed at this time.

## 2018-07-23 ENCOUNTER — Telehealth: Payer: Self-pay | Admitting: Pulmonary Disease

## 2018-07-23 ENCOUNTER — Other Ambulatory Visit: Payer: Self-pay | Admitting: General Surgery

## 2018-07-23 ENCOUNTER — Encounter: Payer: Self-pay | Admitting: General Surgery

## 2018-07-23 MED ORDER — MOMETASONE FURO-FORMOTEROL FUM 200-5 MCG/ACT IN AERO: 2.0000 | INHALATION_SPRAY | Freq: Two times a day (BID) | RESPIRATORY_TRACT | 0 refills | Status: DC

## 2018-07-23 NOTE — Telephone Encounter (Signed)
Sending to App of the afternoon, patient is out of dulera 200 and we have no samples at this time. Was wondering if another inhaler that we did have a sample of could be given to patient, as he does not want to go through the weekend if more dulera doesn't come in. Patient's copay is >$400. I did let patient know we could get patient assistance paperwork but could take up to a month to get through process.  Tonya please advise.

## 2018-07-23 NOTE — Telephone Encounter (Signed)
Dulera 200 samples placed up front with [facility] patient assistance paperwork. Shannon Knapp states she called patient and let them know samples would be ready for them to pick up tomorrow or Monday,  Called patient to let him know samples were ready and placed to be picked up today.  Nothing further needed at this time.

## 2018-07-23 NOTE — Telephone Encounter (Addendum)
Called the patient and made him aware that sample will be available for pick up.  Patient voiced understanding. Nothing further needed at this time.

## 2018-07-23 NOTE — Addendum Note (Signed)
Addended by: Pilar Grammes on: 07/23/2018 04:25 PM   Modules accepted: Orders

## 2018-07-23 NOTE — Telephone Encounter (Signed)
Patient ran out of dulera 200 this morning and office does not have any samples at this time. I let patient know I would keep a note so when we did get samples I would place them at the front for patient. Rx is >$400 a month, so I also let patient know that I would place patient assistance forms in the bag with the samples. [employer] should come in 3/5-3/6.   Will route to myself to follow up on this issue.   Nothing further needed in triage.

## 2018-07-23 NOTE — Telephone Encounter (Signed)
Yes. We can try Breo. Please give him a sample to pick up. Thanks.

## 2018-08-19 ENCOUNTER — Telehealth: Payer: Self-pay | Admitting: Pulmonary Disease

## 2018-08-19 NOTE — Telephone Encounter (Signed)
Returned call to patient and made aware Due to Covid-19 our office is not giving out samples. Pt was given samples on 3/5 and patient assistance paperwork to complete. Pt was very upset and states inhaler $400 and what is he supposed to do. Apologized and tried to explain office are trying to keep patient safe and out of this environment. We have televisits for patient's who need to be seen for their safety. Nothing further needed.

## 2018-08-23 IMAGING — CR DG CHEST 2V
2 series · 2 of 2 positions shown · non-contrast
Comparison: None.

CLINICAL DATA: Cough

EXAM:
CHEST - 2 VIEW

[chest pa]
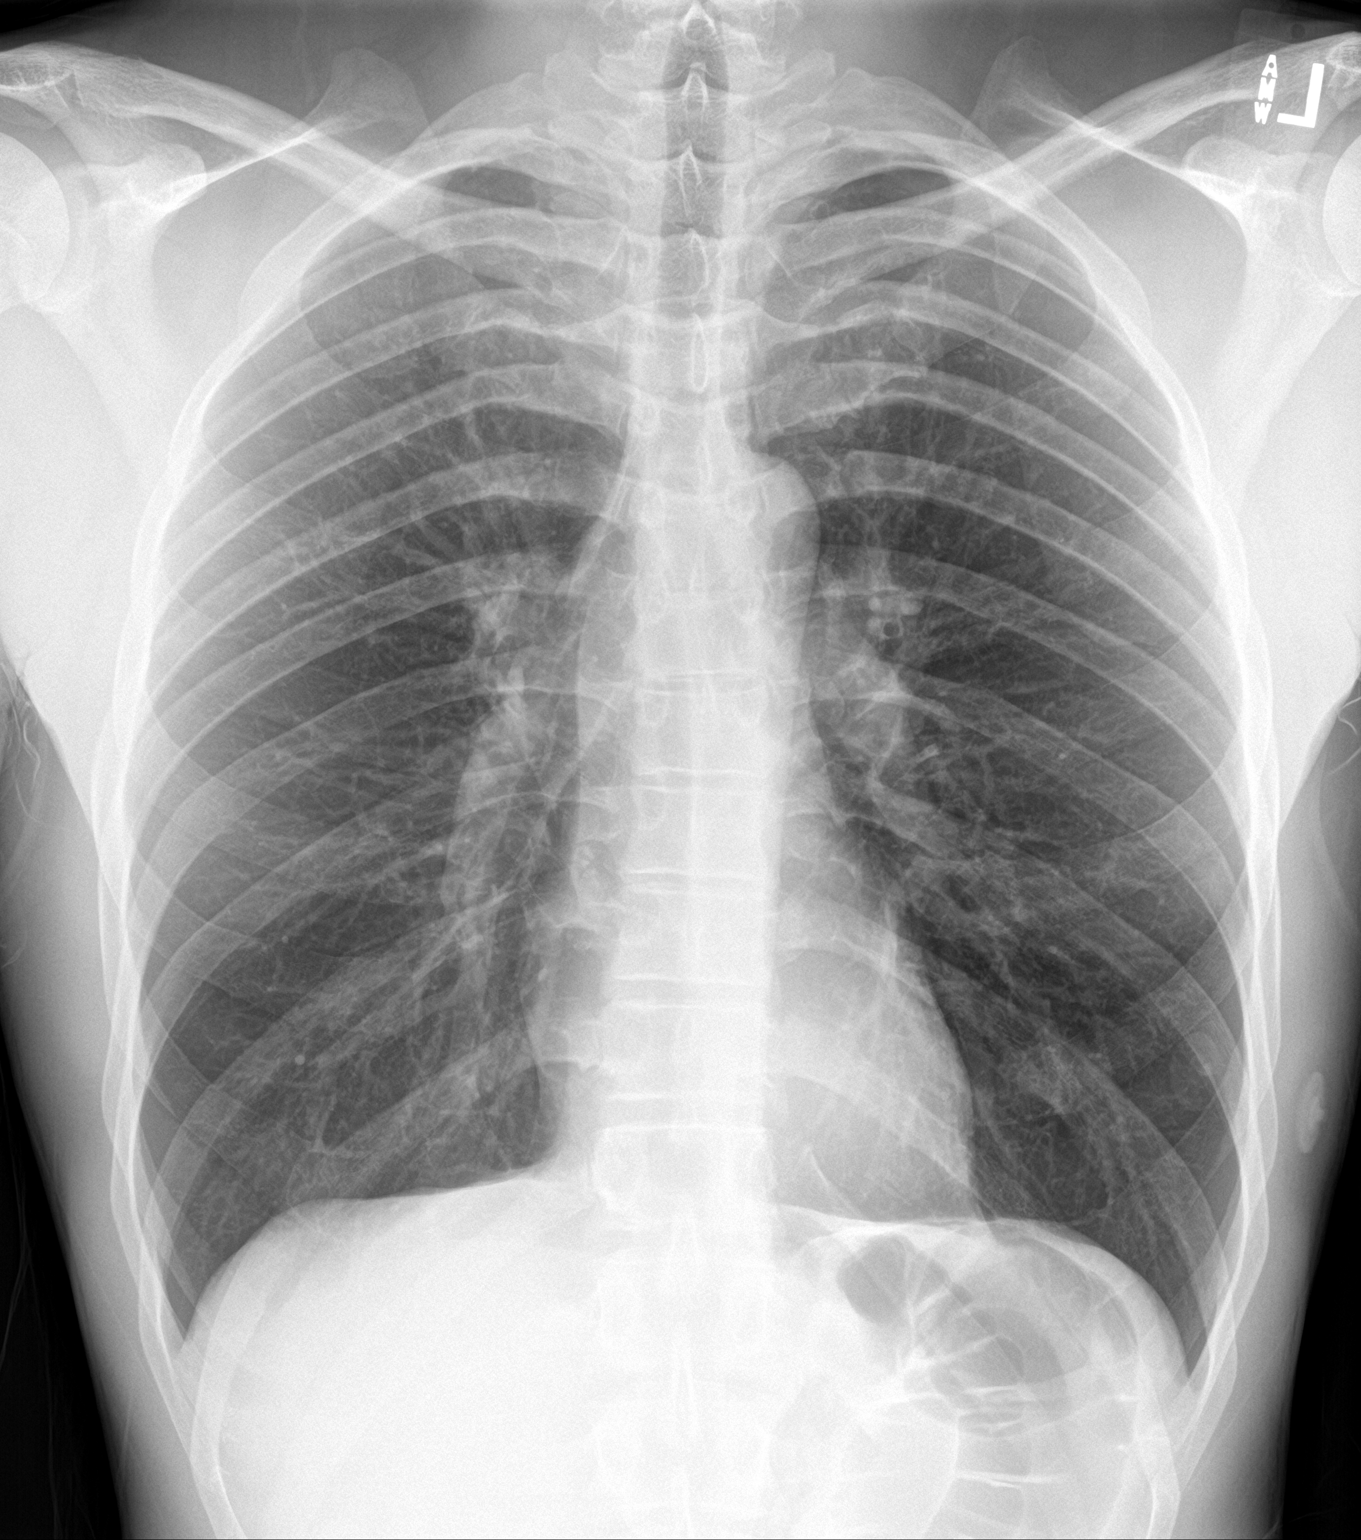

[chest lat]
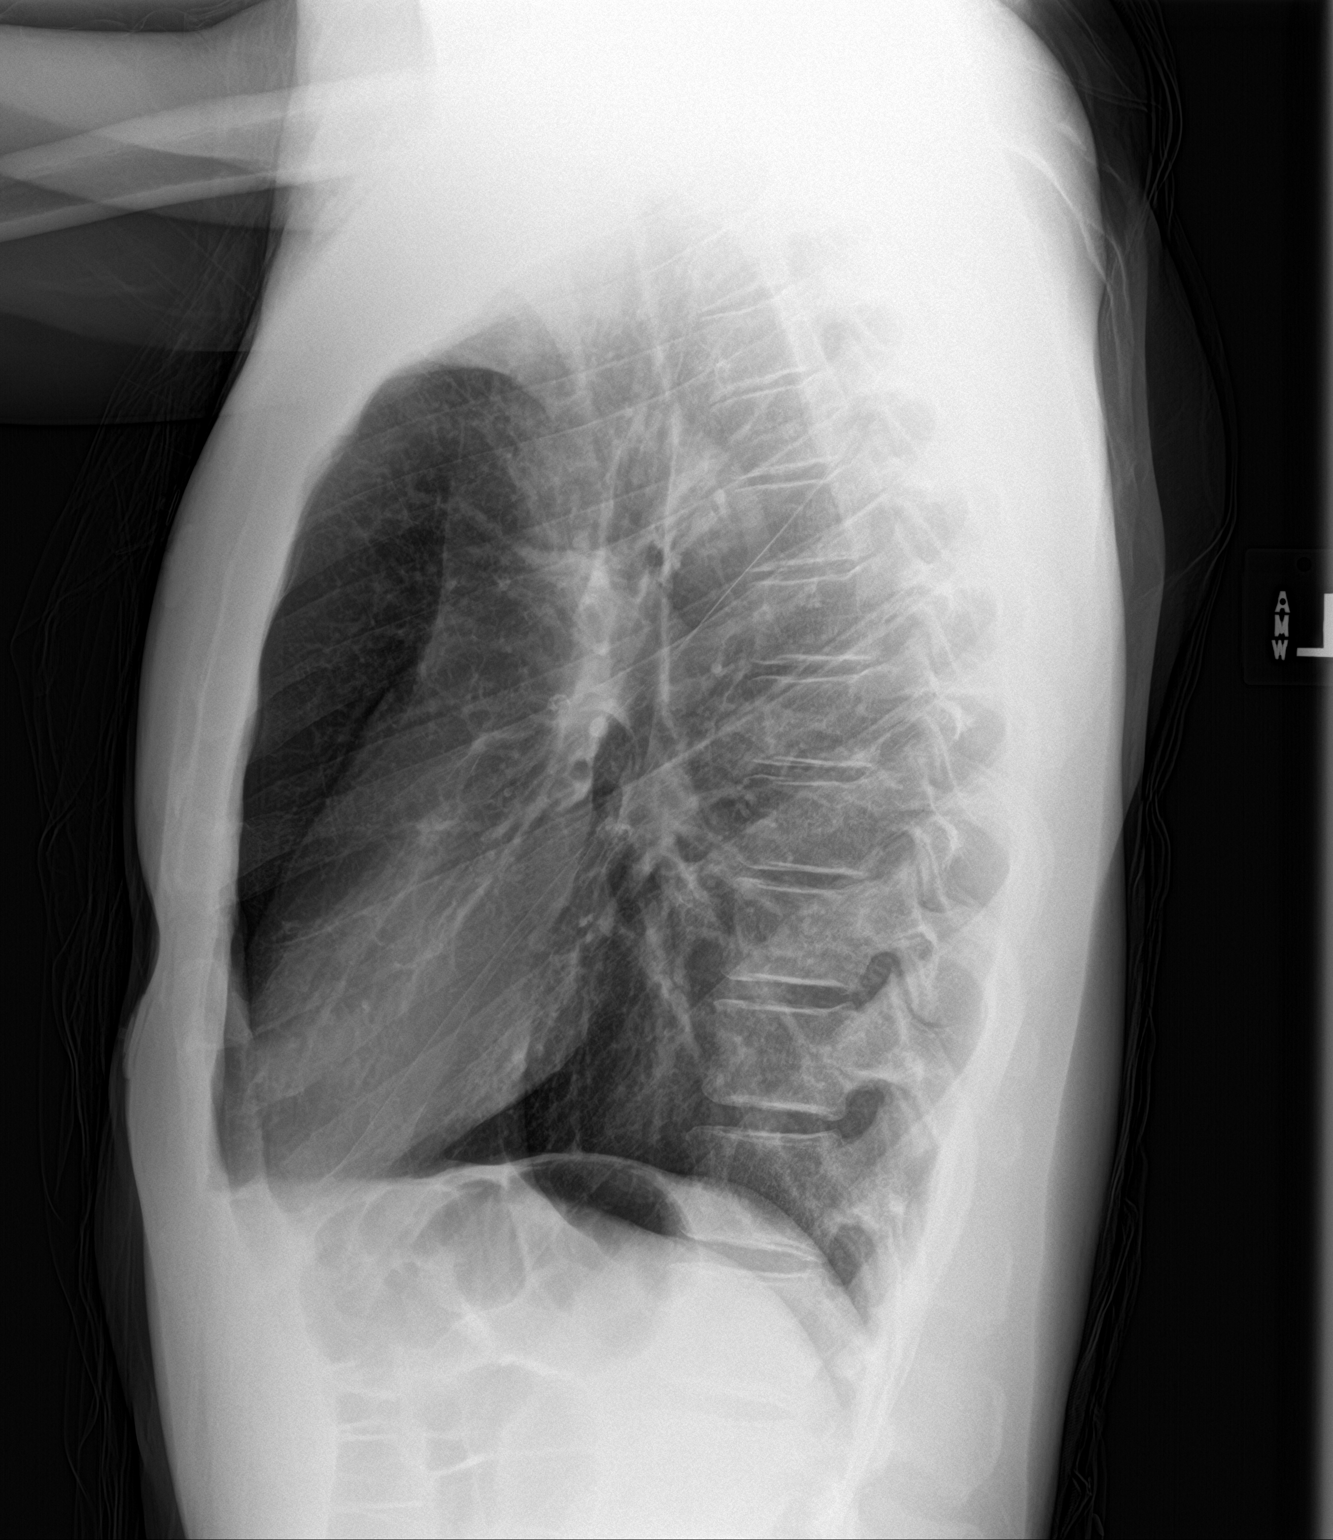

[2 of 2 positions shown; findings below may reference images not displayed]

FINDINGS: Lungs are hyperexpanded. There is no edema or consolidation. Heart
size and pulmonary vascularity are normal. No adenopathy. No evident
bone lesions.
IMPRESSION: Lungs hyperexpanded without edema or consolidation. Heart size
normal.

## 2018-09-01 ENCOUNTER — Ambulatory Visit: Payer: Self-pay | Admitting: Pulmonary Disease

## 2018-09-16 ENCOUNTER — Emergency Department: Payer: HRSA Program

## 2018-09-16 ENCOUNTER — Other Ambulatory Visit: Payer: Self-pay

## 2018-09-16 ENCOUNTER — Encounter: Payer: Self-pay | Admitting: Emergency Medicine

## 2018-09-16 ENCOUNTER — Emergency Department
Admission: EM | Admit: 2018-09-16 | Discharge: 2018-09-16 | Disposition: A | Discharge status: Home / Self Care | Payer: HRSA Program | Attending: Emergency Medicine | Admitting: Emergency Medicine

## 2018-09-16 DIAGNOSIS — Z79899 Other long term (current) drug therapy: Secondary | ICD-10-CM | POA: Diagnosis not present

## 2018-09-16 DIAGNOSIS — Z20828 Contact with and (suspected) exposure to other viral communicable diseases: Secondary | ICD-10-CM | POA: Diagnosis not present

## 2018-09-16 DIAGNOSIS — F17210 Nicotine dependence, cigarettes, uncomplicated: Secondary | ICD-10-CM | POA: Diagnosis not present

## 2018-09-16 DIAGNOSIS — J4541 Moderate persistent asthma with (acute) exacerbation: Secondary | ICD-10-CM | POA: Diagnosis not present

## 2018-09-16 DIAGNOSIS — R0602 Shortness of breath: Secondary | ICD-10-CM | POA: Diagnosis present

## 2018-09-16 LAB — SARS CORONAVIRUS 2 BY RT PCR (HOSPITAL ORDER, PERFORMED IN ~~LOC~~ HOSPITAL LAB): SARS Coronavirus 2: NEGATIVE

## 2018-09-16 MED ORDER — HYDROCOD POLST-CPM POLST ER 10-8 MG/5ML PO SUER
5.0000 mL | Freq: Once | ORAL | Status: AC
  Administered 2018-09-16: 5 mL via ORAL
  Filled 2018-09-16: qty 5

## 2018-09-16 MED ORDER — BENZONATATE 100 MG PO CAPS: 100.0000 mg | ORAL_CAPSULE | Freq: Three times a day (TID) | ORAL | 0 refills | Status: DC | PRN

## 2018-09-16 MED ORDER — IPRATROPIUM-ALBUTEROL 0.5-2.5 (3) MG/3ML IN SOLN
3.0000 mL | Freq: Once | RESPIRATORY_TRACT | Status: AC
  Administered 2018-09-16: 02:00:00 3 mL via RESPIRATORY_TRACT

## 2018-09-16 MED ORDER — MOMETASONE FURO-FORMOTEROL FUM 200-5 MCG/ACT IN AERO: 2.0000 | INHALATION_SPRAY | Freq: Two times a day (BID) | RESPIRATORY_TRACT | 0 refills | Status: DC

## 2018-09-16 MED ORDER — PREDNISONE 20 MG PO TABS: 60.0000 mg | ORAL_TABLET | Freq: Every day | ORAL | 0 refills | Status: AC

## 2018-09-16 MED ORDER — MONTELUKAST SODIUM 10 MG PO TABS: 10.0000 mg | ORAL_TABLET | Freq: Every day | ORAL | 3 refills | Status: DC

## 2018-09-16 MED ORDER — IPRATROPIUM-ALBUTEROL 0.5-2.5 (3) MG/3ML IN SOLN
3.0000 mL | Freq: Once | RESPIRATORY_TRACT | Status: AC
  Administered 2018-09-16: 3 mL via RESPIRATORY_TRACT

## 2018-09-16 MED ORDER — ALBUTEROL SULFATE (2.5 MG/3ML) 0.083% IN NEBU: 2.5000 mg | INHALATION_SOLUTION | RESPIRATORY_TRACT | 2 refills | Status: DC | PRN

## 2018-09-16 MED ORDER — IPRATROPIUM-ALBUTEROL 0.5-2.5 (3) MG/3ML IN SOLN
RESPIRATORY_TRACT | Status: AC
  Administered 2018-09-16: 3 mL via RESPIRATORY_TRACT
  Filled 2018-09-16: qty 6

## 2018-09-16 MED ORDER — PREDNISONE 20 MG PO TABS
60.0000 mg | ORAL_TABLET | Freq: Once | ORAL | Status: AC
  Administered 2018-09-16: 02:00:00 60 mg via ORAL

## 2018-09-16 MED ORDER — LORATADINE 10 MG PO TABS: 10.0000 mg | ORAL_TABLET | Freq: Every day | ORAL | 0 refills | Status: DC

## 2018-09-16 NOTE — ED Triage Notes (Signed)
Pt arrived to the ED for complaints of SOB for the last 2 weeks. Pt reports that he sees a "pulmonologist" and he can not get his medication. Pt is AOx4 in mild respiratory distress with  productive cough.

## 2018-09-16 NOTE — ED Provider Notes (Signed)
[facility] Emergency Department Provider Note   First MD Initiated Contact with Patient 09/16/18 0111     (approximate)  I have reviewed the triage vital signs and the nursing notes.   HISTORY  Chief Complaint Shortness of Breath and COPD    HPI Shannon Knapp is a 33 y.o. male with medical history as listed below including history of asthma and chronic bronchitis resents to the emergency department with progressive dyspnea wheezing and cough over the past 2 weeks.  Patient states that he has been unable to see his pulmonologist and as such cannot receive his Singulair prednisone and Tessalon Perles.      Past Medical History:  Diagnosis Date  . Asthma   . Chronic bronchitis (HCC)   . COPD (chronic obstructive pulmonary disease) [facility](HCC)     Patient Active Problem List   Diagnosis Date Noted  . Severe persistent asthma 05/28/2018  . Acute febrile illness 04/10/2018  . Upper airway cough syndrome   . Hypoxemia   . Syncope   . Allergic rhinitis   . Cough   . Hemoptysis   . Protein-calorie malnutrition, severe 10/20/2017  . Acute respiratory failure with hypoxemia (HCC) 10/20/2017  . Acute respiratory distress   . Asthma exacerbation   . Status asthmaticus 10/18/2017  . Severe persistent acute asthmatic bronchitis 10/17/2017  . Elevated blood pressure reading without diagnosis of hypertension 10/17/2017  . Respiratory distress 10/17/2017    Past Surgical History:  Procedure Laterality Date  . VIDEO BRONCHOSCOPY Bilateral 10/24/2017   Procedure: VIDEO BRONCHOSCOPY WITHOUT FLUORO;  Surgeon: Alyson ReedyYacoub, Wesam G, MD;  [facility];  Service: Cardiopulmonary;  Laterality: Bilateral;    Prior to Admission medications   Medication Sig Start Date End Date Taking? Authorizing Provider  albuterol (PROVENTIL) (2.5 MG/3ML) 0.083% nebulizer solution Take 3 mLs (2.5 mg total) by nebulization every 4 (four) hours as needed for up to 30 days for  wheezing or shortness of breath. Use twice daily and as needed for coughing 09/16/18 10/16/18  Darci CurrentBrown, Leal N, MD  benzonatate (TESSALON) 100 MG capsule Take 1 capsule (100 mg total) by mouth 3 (three) times daily as needed for cough. 09/16/18   Darci CurrentBrown, Martin N, MD  guaiFENesin (MUCINEX) 600 MG 12 hr tablet Take 1,200 mg by mouth daily.    [provider]  loratadine (CLARITIN) 10 MG tablet Take 1 tablet (10 mg total) by mouth daily. 09/16/18   Darci CurrentBrown, [place] N, MD  mometasone-formoterol [facility](DULERA) 200-5 MCG/ACT AERO Inhale 2 puffs into the lungs 2 (two) times daily. 05/28/18 06/27/18  Ivonne AndrewNichols, Tonya S, NP  mometasone-formoterol (DULERA) 200-5 MCG/ACT AERO Inhale 2 puffs into the lungs 2 (two) times daily. 07/23/18   Mannam, Colbert CoyerPraveen, MD  mometasone-formoterol (DULERA) 200-5 MCG/ACT AERO Inhale 2 puffs into the lungs 2 (two) times daily. 07/23/18   Ivonne AndrewNichols, Tonya S, NP  mometasone-formoterol (DULERA) 200-5 MCG/ACT AERO Inhale 2 puffs into the lungs 2 (two) times daily. 09/16/18   Darci CurrentBrown, [place] N, MD  montelukast (SINGULAIR) 10 MG tablet Take 1 tablet (10 mg total) by mouth at bedtime. 09/16/18   Darci CurrentBrown, [place] N, MD  oxyCODONE-acetaminophen (PERCOCET/ROXICET) 5-325 MG tablet Take 1 tablet by mouth every 6 (six) hours as needed for severe pain. 05/26/18   Hedges, Tinnie GensJeffrey, PA-C  predniSONE (DELTASONE) 10 MG tablet Take 1 tablet (10 mg total) by mouth daily with breakfast. Take 50mg  daily for 3days,Take 40mg  daily for 3days,Take 30mg  daily for 3days,Take 20mg  daily for 3days,Take 10mg   daily. 04/10/18   Rolly Salter, MD  predniSONE (DELTASONE) 20 MG tablet Take 3 tablets (60 mg total) by mouth daily for 5 days. 09/16/18 09/21/18  Darci Current, MD    Allergies Amoxicillin; Fish allergy; Shellfish allergy; and Penicillins  Family History  Problem Relation Age of Onset  . Hypertension Mother   . Asthma Father   . Hypertension Father     Social History Social History   Tobacco Use  . Smoking  status: Current Every Day Smoker    Types: Cigarettes    Last attempt to quit: 09/02/2017    Years since quitting: 1.0  . Smokeless tobacco: Never Used  . Tobacco comment: 4-5 cigarettes daily  Substance Use Topics  . Alcohol use: Yes    Alcohol/week: 15.0 standard drinks    Types: 15 Cans of beer per week  . Drug use: No    Review of Systems Constitutional: No fever/chills Eyes: No visual changes. ENT: No sore throat. Cardiovascular: Denies chest pain. Respiratory: Denies shortness of breath. Gastrointestinal: No abdominal pain.  No nausea, no vomiting.  No diarrhea.  No constipation. Genitourinary: Negative for dysuria. Musculoskeletal: Negative for neck pain.  Negative for back pain. Integumentary: Negative for rash. Neurological: Negative for headaches, focal weakness or numbness.  ____________________________________________   PHYSICAL EXAM:  VITAL SIGNS: ED Triage Vitals  Enc Vitals Group     BP 09/16/18 0052 (!) 152/93     Pulse Rate 09/16/18 0052 89     Resp 09/16/18 0052 (!) 22     Temp 09/16/18 0052 97.6 F (36.4 C)     Temp Source 09/16/18 0052 Oral     SpO2 09/16/18 0052 96 %     Weight 09/16/18 0053 77.6 kg (171 lb)     Height 09/16/18 0053 1.803 m ( )     Head Circumference --      Peak Flow --      Pain Score 09/16/18 0052 0     Pain Loc --      Pain Edu? --      Excl. in GC? --     Constitutional: Alert and oriented. Well appearing and in no acute distress. Eyes: Conjunctivae are normal. PERRL. EOMI. Mouth/Throat: Mucous membranes are moist.  Oropharynx non-erythematous. Neck: No stridor.   Cardiovascular: Normal rate, regular rhythm. Good peripheral circulation. Grossly normal heart sounds. Respiratory: Normal respiratory effort.  No retractions.  Mild to moderate expiratory wheezing. Gastrointestinal: Soft and nontender. No distention.  Musculoskeletal: No lower extremity tenderness nor edema. No gross deformities of extremities.  Neurologic:  Normal speech and language. No gross focal neurologic deficits are appreciated.  Skin:  Skin is warm, dry and intact. No rash noted. Psychiatric: Mood and affect are normal. Speech and behavior are normal.  ____________________________________________   LABS (all labs ordered are listed, but only abnormal results are displayed)  Labs Reviewed  SARS CORONAVIRUS 2 (HOSPITAL ORDER, PERFORMED IN [facility] LAB)   _  RADIOLOGY I, [place] N Alyviah Crandle, personally viewed and evaluated these images (plain radiographs) as part of my medical decision making, as well as reviewing the written report by the radiologist.  ED MD interpretation: Negative portable chest x-ray per radiologist.  Official radiology report(s): Dg Chest Portable 1 View  Result Date: 09/16/2018 CLINICAL DATA:  32 year old male with shortness of breath for 2 weeks and cough. EXAM: PORTABLE CHEST 1 VIEW COMPARISON:  04/09/2018 and earlier. FINDINGS: Portable AP upright view at 0128 hours. Lung volumes and  mediastinal contours are within normal limits. Allowing for portable technique the lungs are clear. Visualized tracheal air column is within normal limits. Negative visible bowel gas pattern and osseous structures. IMPRESSION: Negative portable chest. Electronically Signed   By: Odessa Fleming M.D.   On: 09/16/2018 01:49    _______________________________________  Procedures   ____________________________________________   INITIAL IMPRESSION / MDM / ASSESSMENT AND PLAN / ED COURSE  As part of my medical decision making, I reviewed the following data within the electronic MEDICAL RECORD NUMBER   33 year old male presenting with above-stated history and physical exam concerning for possible acute asthma exacerbation.  Also considered possibly of bronchitis pneumonia and COVID-19.  Chest x-ray revealed results negative per radiologist.  COVID-19 negative.  Patient given 2 duo nebs Tussionex with resolution of  wheezing and coughing.  Patient will be prescribed home medications that he requested  Shannon Knapp was evaluated in Emergency Department on 09/16/2018 for the symptoms described in the history of present illness. He was evaluated in the context of the global COVID-19 pandemic, which necessitated consideration that the patient might be at risk for infection with the SARS-CoV-2 virus that causes COVID-19. Institutional protocols and algorithms that pertain to the evaluation of patients at risk for COVID-19 are in a state of rapid change based on information released by regulatory bodies including the CDC and federal and state organizations. These policies and algorithms were followed during the patient's care in the ED.    ____________________________________________  FINAL CLINICAL IMPRESSION(S) / ED DIAGNOSES  Final diagnoses:  Moderate persistent asthma with exacerbation     MEDICATIONS GIVEN DURING THIS VISIT:  Medications  chlorpheniramine-HYDROcodone (TUSSIONEX) 10-8 MG/5ML suspension 5 mL (5 mLs Oral Given 09/16/18 0135)  ipratropium-albuterol (DUONEB) 0.5-2.5 (3) MG/3ML nebulizer solution 3 mL (3 mLs Nebulization Given 09/16/18 0135)  ipratropium-albuterol (DUONEB) 0.5-2.5 (3) MG/3ML nebulizer solution 3 mL (3 mLs Nebulization Given 09/16/18 0135)  predniSONE (DELTASONE) tablet 60 mg (60 mg Oral Given 09/16/18 0135)     ED Discharge Orders         Ordered    loratadine (CLARITIN) 10 MG tablet  Daily     09/16/18 0250    albuterol (PROVENTIL) (2.5 MG/3ML) 0.083% nebulizer solution  Every 4 hours PRN     09/16/18 0250    benzonatate (TESSALON) 100 MG capsule  3 times daily PRN     09/16/18 0250    mometasone-formoterol (DULERA) 200-5 MCG/ACT AERO  2 times daily     09/16/18 0250    predniSONE (DELTASONE) 20 MG tablet  Daily     09/16/18 0250    montelukast (SINGULAIR) 10 MG tablet  Daily at bedtime     09/16/18 2229           Note:  This document was prepared using  Dragon voice recognition software and may include unintentional dictation errors.   Darci Current, MD 09/16/18 [phone]

## 2018-09-16 NOTE — ED Notes (Addendum)
Spoke with pt's fiance, [name], over the phone. She has been updated with pt permission. Phone number has been verified in the chart at this time.

## 2018-09-17 ENCOUNTER — Emergency Department
Admission: EM | Admit: 2018-09-17 | Discharge: 2018-09-17 | Disposition: A | Discharge status: Home / Self Care | Payer: Self-pay | Attending: Emergency Medicine | Admitting: Emergency Medicine

## 2018-09-17 ENCOUNTER — Encounter: Payer: Self-pay | Admitting: Emergency Medicine

## 2018-09-17 ENCOUNTER — Other Ambulatory Visit: Payer: Self-pay

## 2018-09-17 DIAGNOSIS — R059 Cough, unspecified: Secondary | ICD-10-CM

## 2018-09-17 DIAGNOSIS — F17210 Nicotine dependence, cigarettes, uncomplicated: Secondary | ICD-10-CM | POA: Insufficient documentation

## 2018-09-17 DIAGNOSIS — R05 Cough: Secondary | ICD-10-CM

## 2018-09-17 DIAGNOSIS — Z79899 Other long term (current) drug therapy: Secondary | ICD-10-CM | POA: Insufficient documentation

## 2018-09-17 DIAGNOSIS — J45901 Unspecified asthma with (acute) exacerbation: Secondary | ICD-10-CM | POA: Insufficient documentation

## 2018-09-17 DIAGNOSIS — J449 Chronic obstructive pulmonary disease, unspecified: Secondary | ICD-10-CM | POA: Insufficient documentation

## 2018-09-17 DIAGNOSIS — J45909 Unspecified asthma, uncomplicated: Secondary | ICD-10-CM

## 2018-09-17 LAB — BASIC METABOLIC PANEL
Anion gap: 8 (ref 5–15)
BUN: 9 mg/dL (ref 6–20)
CO2: 26 mmol/L (ref 22–32)
Calcium: 9.1 mg/dL (ref 8.9–10.3)
Chloride: 106 mmol/L (ref 98–111)
Creatinine, Ser: 0.74 mg/dL (ref 0.61–1.24)
GFR calc Af Amer: >60 mL/min (ref >60)
GFR calc non Af Amer: >60 mL/min (ref >60)
Glucose, Bld: 94 mg/dL (ref 70–99)
Potassium: 3.6 mmol/L (ref 3.5–5.1)
Sodium: 140 mmol/L (ref 135–145)

## 2018-09-17 LAB — CBC
HCT: 44 % (ref 39.0–52.0)
Hemoglobin: 14.7 g/dL (ref 13.0–17.0)
MCH: 30 pg (ref 26.0–34.0)
MCHC: 33.4 g/dL (ref 30.0–36.0)
MCV: 89.8 fL (ref 80.0–100.0)
Platelets: 297 10*3/uL (ref 150–400)
RBC: 4.9 MIL/uL (ref 4.22–5.81)
RDW: 13 % (ref 11.5–15.5)
WBC: 7 10*3/uL (ref 4.0–10.5)
nRBC: 0 % (ref 0.0–0.2)

## 2018-09-17 LAB — TROPONIN I: Troponin I: <0.03 ng/mL (ref <0.03)

## 2018-09-17 MED ORDER — PREDNISONE 20 MG PO TABS
60.0000 mg | ORAL_TABLET | Freq: Once | ORAL | Status: AC
  Administered 2018-09-17: 60 mg via ORAL
  Filled 2018-09-17: qty 3

## 2018-09-17 MED ORDER — IPRATROPIUM-ALBUTEROL 0.5-2.5 (3) MG/3ML IN SOLN
3.0000 mL | Freq: Once | RESPIRATORY_TRACT | Status: AC
  Administered 2018-09-17: 3 mL via RESPIRATORY_TRACT
  Filled 2018-09-17: qty 3

## 2018-09-17 MED ORDER — HYDROCOD POLST-CPM POLST ER 10-8 MG/5ML PO SUER
5.0000 mL | Freq: Once | ORAL | Status: AC
  Administered 2018-09-17: 5 mL via ORAL
  Filled 2018-09-17: qty 5

## 2018-09-17 MED ORDER — GUAIFENESIN-CODEINE 100-10 MG/5ML PO SOLN: 5.0000 mL | Freq: Four times a day (QID) | ORAL | 0 refills | Status: DC | PRN

## 2018-09-17 NOTE — ED Provider Notes (Signed)
[facility] Emergency Department Provider Note  Time seen: 1:39 PM  I have reviewed the triage vital signs and the nursing notes.   HISTORY  Chief Complaint Chest Pain; Cough; and Shortness of Breath   HPI Shannon Knapp is a 33 y.o. male with a past medical history of asthma, COPD, presents to the emergency department for shortness of breath and chest discomfort.  According to the patient over the past few days he has been experiencing shortness of breath as well as chest tightness.  Patient has a history of asthma but has been off all of his medications for at least 3 months.  Will not have insurance again until July.  Patient was seen here yesterday, was unable to fill his prescriptions.  Return today for shortness of breath.  Patient has a nebulizer machine at home but no more albuterol solution.  Patient satting 97%.  Was seen here yesterday had a negative corona test as well as a negative chest x-ray performed yesterday.   Past Medical History:  Diagnosis Date  . Asthma   . Chronic bronchitis (HCC)   . COPD (chronic obstructive pulmonary disease) [facility])     Patient Active Problem List   Diagnosis Date Noted  . Severe persistent asthma 05/28/2018  . Acute febrile illness 04/10/2018  . Upper airway cough syndrome   . Hypoxemia   . Syncope   . Allergic rhinitis   . Cough   . Hemoptysis   . Protein-calorie malnutrition, severe 10/20/2017  . Acute respiratory failure with hypoxemia (HCC) 10/20/2017  . Acute respiratory distress   . Asthma exacerbation   . Status asthmaticus 10/18/2017  . Severe persistent acute asthmatic bronchitis 10/17/2017  . Elevated blood pressure reading without diagnosis of hypertension 10/17/2017  . Respiratory distress 10/17/2017    Past Surgical History:  Procedure Laterality Date  . VIDEO BRONCHOSCOPY Bilateral 10/24/2017   Procedure: VIDEO BRONCHOSCOPY WITHOUT FLUORO;  Surgeon: Alyson Reedy, MD;  Location: [facility]  ENDOSCOPY;  Service: Cardiopulmonary;  Laterality: Bilateral;    Prior to Admission medications   Medication Sig Start Date End Date Taking? Authorizing Provider  albuterol (PROVENTIL) (2.5 MG/3ML) 0.083% nebulizer solution Take 3 mLs (2.5 mg total) by nebulization every 4 (four) hours as needed for up to 30 days for wheezing or shortness of breath. Use twice daily and as needed for coughing 09/16/18 10/16/18  Darci Current, MD  benzonatate (TESSALON) 100 MG capsule Take 1 capsule (100 mg total) by mouth 3 (three) times daily as needed for cough. 09/16/18   Darci Current, MD  guaiFENesin (MUCINEX) 600 MG 12 hr tablet Take 1,200 mg by mouth daily.    [provider]  loratadine (CLARITIN) 10 MG tablet Take 1 tablet (10 mg total) by mouth daily. 09/16/18   Darci Current, MD  mometasone-formoterol [facility]) 200-5 MCG/ACT AERO Inhale 2 puffs into the lungs 2 (two) times daily. 05/28/18 06/27/18  Ivonne Andrew, NP  mometasone-formoterol (DULERA) 200-5 MCG/ACT AERO Inhale 2 puffs into the lungs 2 (two) times daily. 07/23/18   Mannam, Colbert Coyer, MD  mometasone-formoterol (DULERA) 200-5 MCG/ACT AERO Inhale 2 puffs into the lungs 2 (two) times daily. 07/23/18   Ivonne Andrew, NP  mometasone-formoterol (DULERA) 200-5 MCG/ACT AERO Inhale 2 puffs into the lungs 2 (two) times daily. 09/16/18   Darci Current, MD  montelukast (SINGULAIR) 10 MG tablet Take 1 tablet (10 mg total) by mouth at bedtime. 09/16/18   Darci Current, MD  oxyCODONE-acetaminophen (PERCOCET/ROXICET) 5-325 MG tablet Take 1 tablet by mouth every 6 (six) hours as needed for severe pain. 05/26/18   Hedges, Tinnie Gens, PA-C  predniSONE (DELTASONE) 10 MG tablet Take 1 tablet (10 mg total) by mouth daily with breakfast. Take  daily for 3days,Take  daily for 3days,Take  daily for 3days,Take  daily for 3days,Take  daily. 04/10/18   Rolly Salter, MD  predniSONE (DELTASONE) 20 MG tablet Take 3 tablets (60 mg total) by  mouth daily for 5 days. 09/16/18 09/21/18  Darci Current, MD    Allergies  Allergen Reactions  . Amoxicillin Hives    Has patient had a PCN reaction causing immediate rash, facial/tongue/throat swelling, SOB or lightheadedness with hypotension: Yes Has patient had a PCN reaction causing severe rash involving mucus membranes or skin necrosis: No Has patient had a PCN reaction that required hospitalization: No Has patient had a PCN reaction occurring within the last 10 years: Yes If all of the above answers are "NO", then may proceed with Cephalosporin use.  . Fish Allergy Swelling  . Shellfish Allergy Swelling  . Penicillins Hives    Has patient had a PCN reaction causing immediate rash, facial/tongue/throat swelling, SOB or lightheadedness with hypotension: Yes Has patient had a PCN reaction causing severe rash involving mucus membranes or skin necrosis: No Has patient had a PCN reaction that required hospitalization: No Has patient had a PCN reaction occurring within the last 10 years: Yes If all of the above answers are "NO", then may proceed with Cephalosporin use.    Family History  Problem Relation Age of Onset  . Hypertension Mother   . Asthma Father   . Hypertension Father     Social History Social History   Tobacco Use  . Smoking status: Current Every Day Smoker    Types: Cigarettes    Last attempt to quit: 09/02/2017    Years since quitting: 1.0  . Smokeless tobacco: Never Used  . Tobacco comment: 4-5 cigarettes daily  Substance Use Topics  . Alcohol use: Yes    Alcohol/week: 15.0 standard drinks    Types: 15 Cans of beer per week  . Drug use: No    Review of Systems Constitutional: Negative for fever. ENT: Negative for recent illness/congestion Cardiovascular: Patient states chest tightness over the last few days Respiratory: Mild shortness of breath the last few days.  Positive for dry cough.  No sputum production. Gastrointestinal: Negative for abdominal  pain, vomiting Musculoskeletal: Negative for musculoskeletal complaints Skin: Negative for skin complaints  Neurological: Negative for headache All other ROS negative ____________________________________________   PHYSICAL EXAM:  VITAL SIGNS: ED Triage Vitals  Enc Vitals Group     BP 09/17/18 1317 (!) 168/120     Pulse Rate 09/17/18 1317 83     Resp 09/17/18 1317 20     Temp 09/17/18 1317 98.6 F (37 C)     Temp Source 09/17/18 1317 Oral     SpO2 09/17/18 1317 97 %     Weight 09/17/18 1318 165 lb (74.8 kg)     Height 09/17/18 1318  (1.803 m)     Head Circumference --      Peak Flow --      Pain Score 09/17/18 1317 7     Pain Loc --      Pain Edu? --      Excl. in GC? --    Constitutional: Alert and oriented. Well appearing and in no distress. Eyes: Normal exam  ENT      Head: Normocephalic and atraumatic.      Mouth/Throat: Mucous membranes are moist. Cardiovascular: Normal rate, regular rhythm.  Respiratory: Normal respiratory effort without tachypnea nor retractions.  Mild expiratory wheeze bilaterally. Gastrointestinal: Soft and nontender. No distention.   Musculoskeletal: Nontender with normal range of motion in all extremities.  Neurologic:  Normal speech and language. No gross focal neurologic deficits Skin:  Skin is warm, dry and intact.  Psychiatric: Mood and affect are normal. Speech and behavior are normal.   ____________________________________________    EKG  EKG viewed and interpreted by myself shows normal sinus rhythm at 80 bpm with a narrow QRS, normal axis, normal intervals, mild ST elevation consistent with early repolarization.  No reciprocal depressions or other concerning findings.  ____________________________________________   INITIAL IMPRESSION / ASSESSMENT AND PLAN / ED COURSE  Pertinent labs & imaging results that were available during my care of the patient were reviewed by me and considered in my medical decision making (see chart  for details).   Patient presents to the emergency department for shortness of breath/chest tightness.  Differential would include asthma exacerbation, less likely ACS.  Pneumonia, URI.  Patient reassuringly had a negative chest x-ray performed yesterday, negative corona test performed yesterday.  Patient's work-up is reassuringly negative.  Medication management was able to fill several prescriptions for the patient.  I will discharge with a cough medication for the patient which he will have to fill on his own.  Patient agreeable to plan of care.  Shannon Knapp was evaluated in Emergency Department on 09/17/2018 for the symptoms described in the history of present illness. He was evaluated in the context of the global COVID-19 pandemic, which necessitated consideration that the patient might be at risk for infection with the SARS-CoV-2 virus that causes COVID-19. Institutional protocols and algorithms that pertain to the evaluation of patients at risk for COVID-19 are in a state of rapid change based on information released by regulatory bodies including the CDC and federal and state organizations. These policies and algorithms were followed during the patient's care in the ED.  ____________________________________________   FINAL CLINICAL IMPRESSION(S) / ED DIAGNOSES  Dyspnea Asthma exacerbation   Minna AntisPaduchowski, Rachna Schonberger, MD 09/17/18 1431

## 2018-09-17 NOTE — ED Triage Notes (Signed)
Pt reports SOB, cough, congestion and wheezing for about a year now. Pt states was on meds for the same but has been out. Pt denies fevers but states it can be random

## 2018-10-04 IMAGING — CR DG CHEST 2V
2 series · 2 of 2 positions shown · non-contrast
Comparison: 10/21/2017

CLINICAL DATA: Chest congestion with wheeze and chest pain.

EXAM:
CHEST - 2 VIEW

[chest pa]
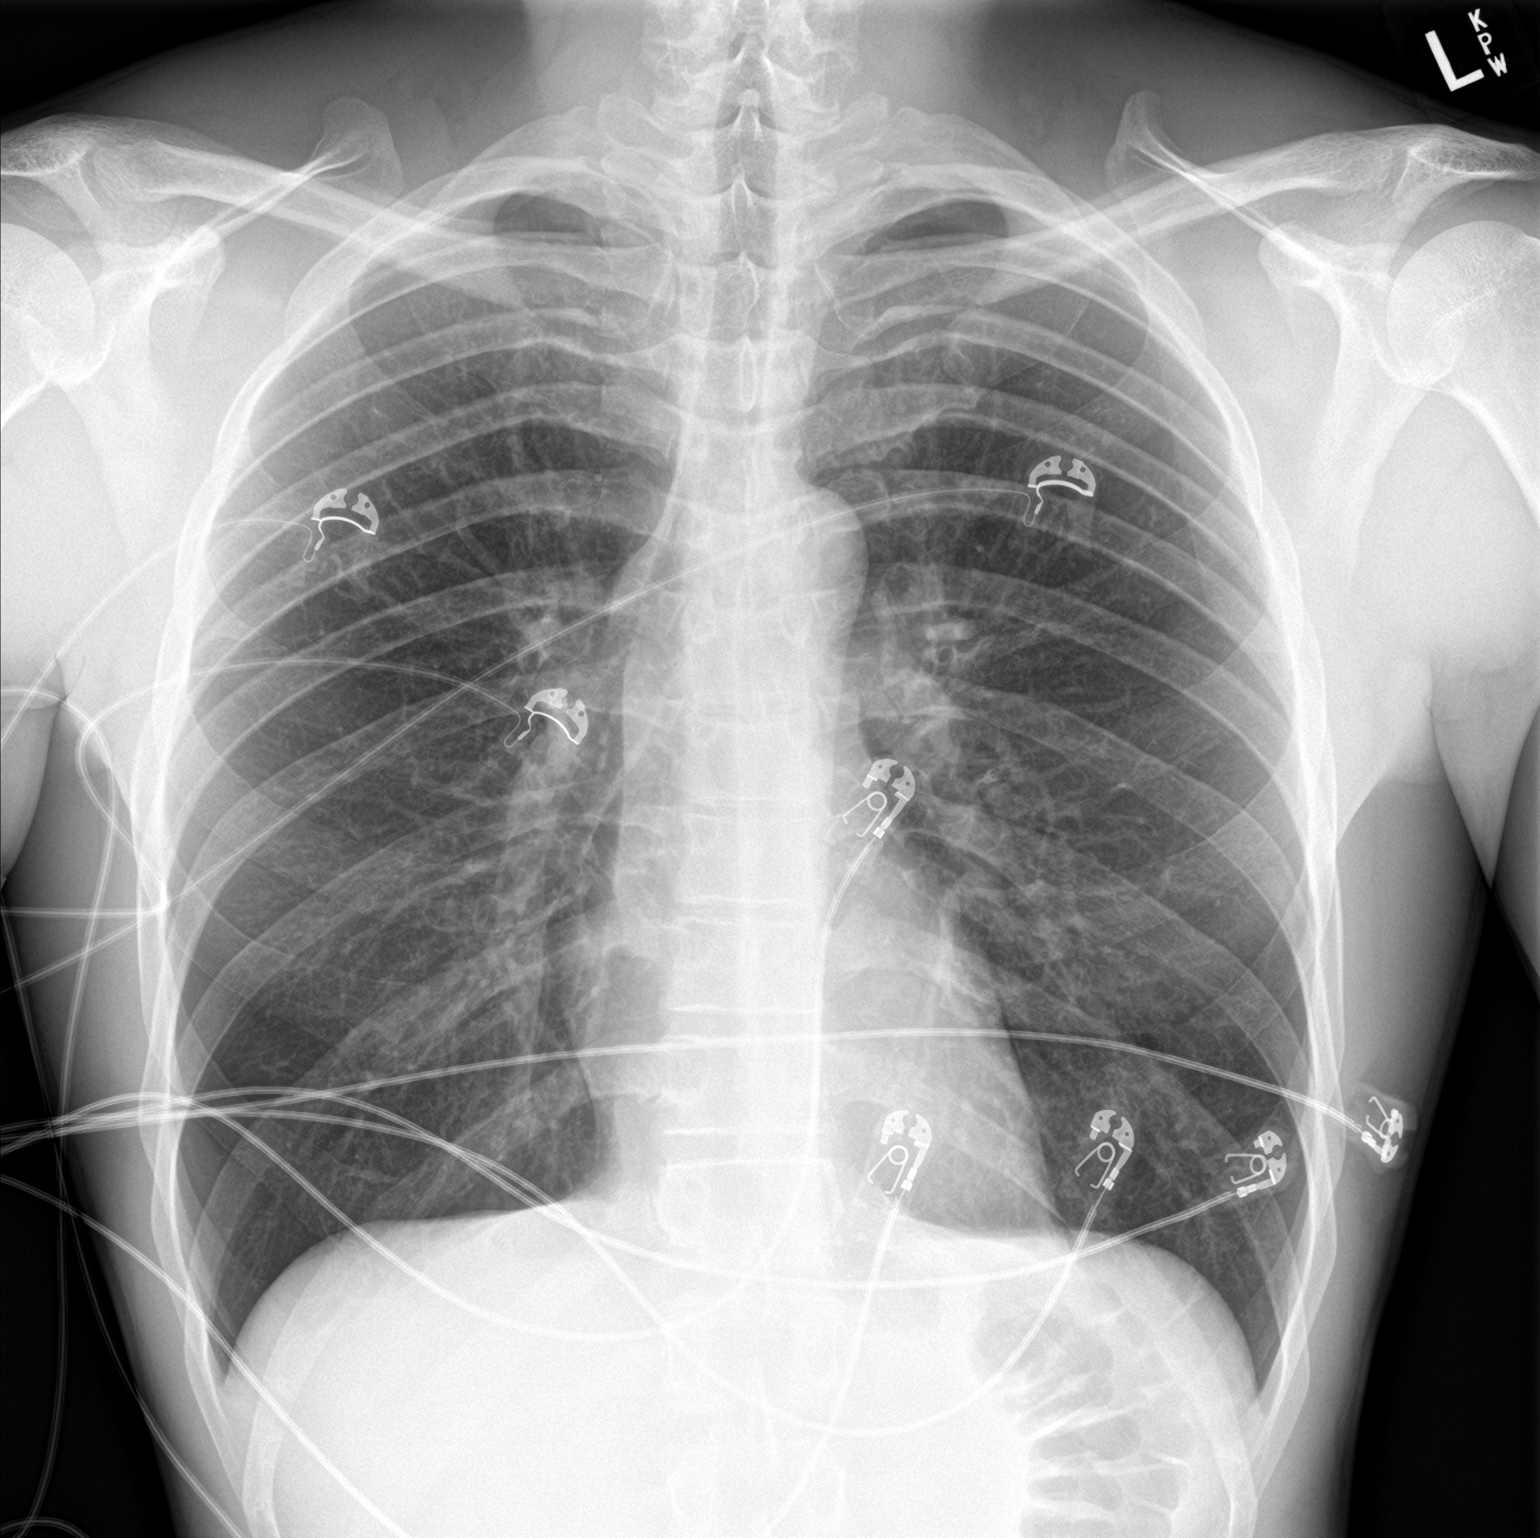

[chest lat]
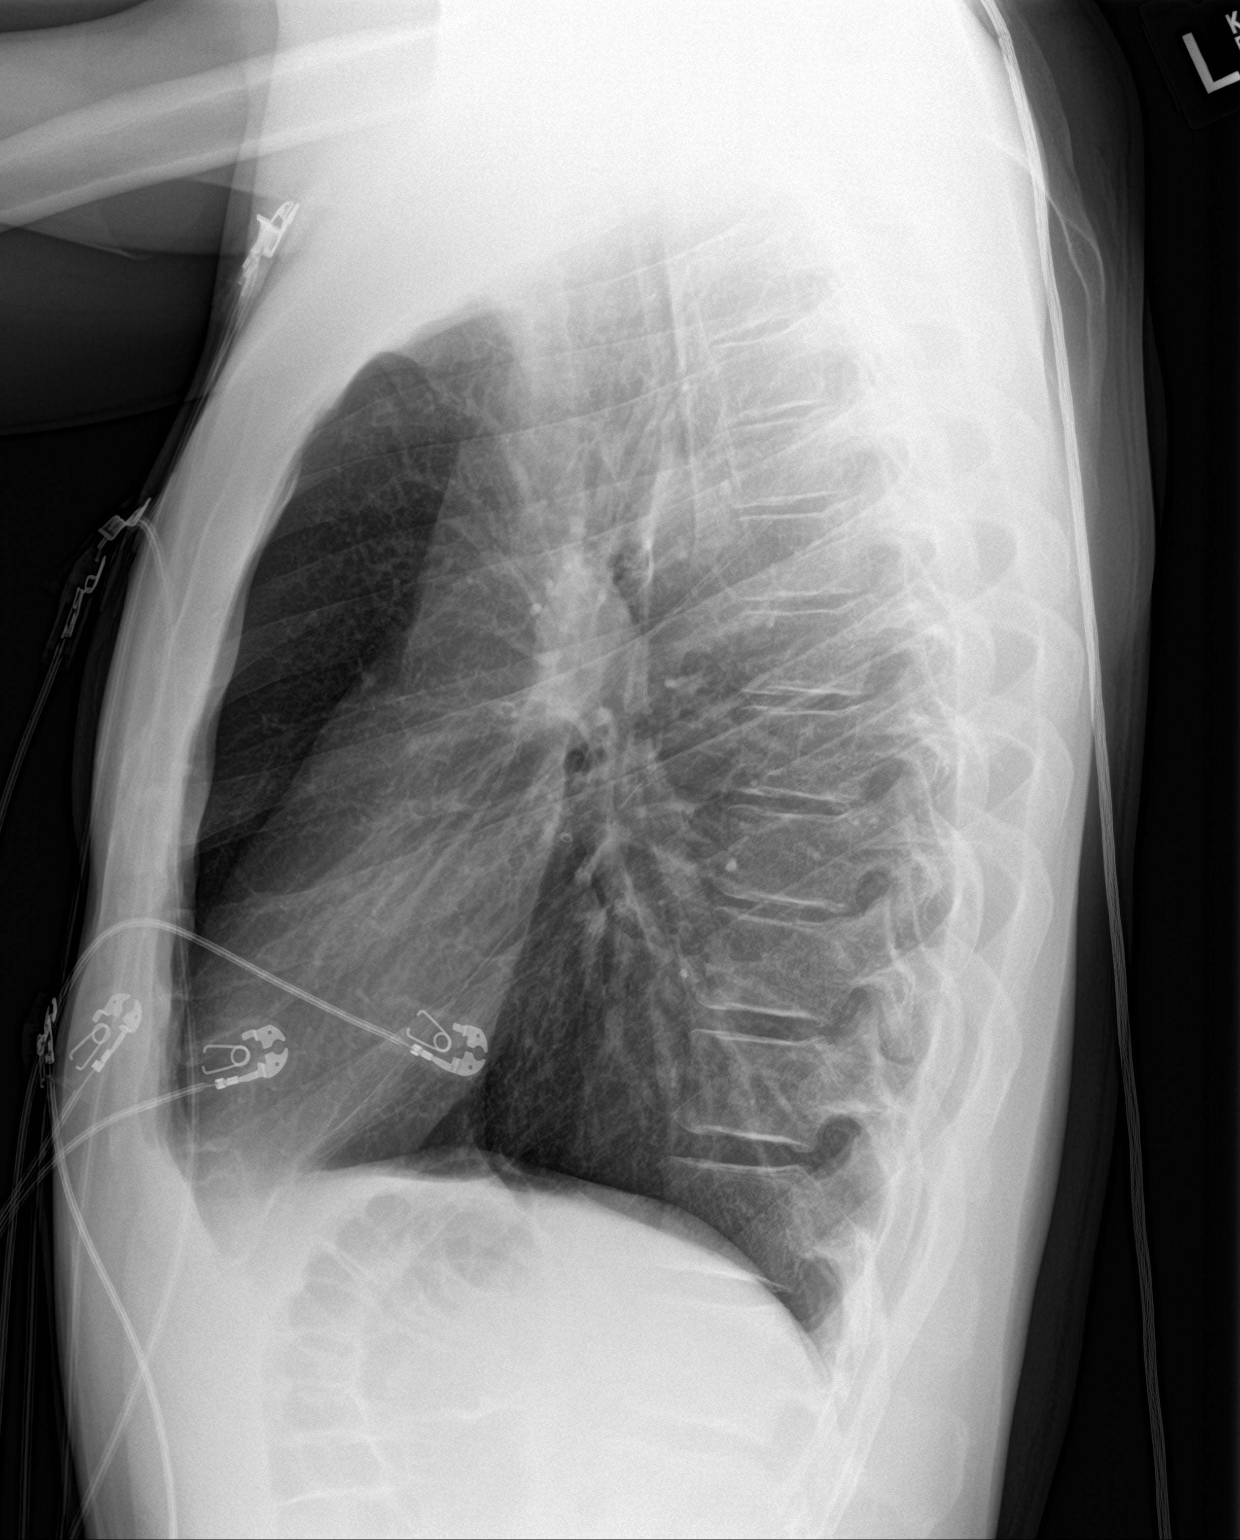

[2 of 2 positions shown; findings below may reference images not displayed]

FINDINGS: The heart size and mediastinal contours are within normal limits.
Pulmonary hyperinflation without consolidation, effusion or
pulmonary edema. No pneumothorax. The visualized skeletal structures
are unremarkable.
IMPRESSION: Hyperinflated lungs without active pulmonary disease.

## 2018-10-09 ENCOUNTER — Other Ambulatory Visit: Payer: Self-pay

## 2018-10-09 ENCOUNTER — Emergency Department: Payer: PRIVATE HEALTH INSURANCE

## 2018-10-09 ENCOUNTER — Encounter: Payer: Self-pay | Admitting: Intensive Care

## 2018-10-09 ENCOUNTER — Encounter: Payer: Self-pay | Admitting: Emergency Medicine

## 2018-10-09 ENCOUNTER — Emergency Department
Admission: EM | Admit: 2018-10-09 | Discharge: 2018-10-09 | Disposition: A | Discharge status: Home / Self Care | Payer: PRIVATE HEALTH INSURANCE | Attending: Student in an Organized Health Care Education/Training Program | Admitting: Student in an Organized Health Care Education/Training Program

## 2018-10-09 ENCOUNTER — Emergency Department
Admission: EM | Admit: 2018-10-09 | Discharge: 2018-10-09 | Disposition: A | Discharge status: Home / Self Care | Payer: Self-pay | Attending: Emergency Medicine | Admitting: Emergency Medicine

## 2018-10-09 DIAGNOSIS — M79602 Pain in left arm: Secondary | ICD-10-CM

## 2018-10-09 DIAGNOSIS — I10 Essential (primary) hypertension: Secondary | ICD-10-CM | POA: Diagnosis not present

## 2018-10-09 DIAGNOSIS — F17210 Nicotine dependence, cigarettes, uncomplicated: Secondary | ICD-10-CM | POA: Insufficient documentation

## 2018-10-09 DIAGNOSIS — J9801 Acute bronchospasm: Secondary | ICD-10-CM | POA: Insufficient documentation

## 2018-10-09 DIAGNOSIS — M25522 Pain in left elbow: Secondary | ICD-10-CM | POA: Diagnosis present

## 2018-10-09 DIAGNOSIS — J449 Chronic obstructive pulmonary disease, unspecified: Secondary | ICD-10-CM | POA: Diagnosis not present

## 2018-10-09 DIAGNOSIS — Z79899 Other long term (current) drug therapy: Secondary | ICD-10-CM | POA: Insufficient documentation

## 2018-10-09 MED ORDER — FLUTICASONE-SALMETEROL 100-50 MCG/DOSE IN AEPB: 1.0000 | INHALATION_SPRAY | Freq: Two times a day (BID) | RESPIRATORY_TRACT | 1 refills | Status: DC

## 2018-10-09 MED ORDER — PREDNISONE 20 MG PO TABS
60.0000 mg | ORAL_TABLET | Freq: Once | ORAL | Status: AC
  Administered 2018-10-09: 10:00:00 60 mg via ORAL
  Filled 2018-10-09: qty 3

## 2018-10-09 MED ORDER — MONTELUKAST SODIUM 10 MG PO TABS: 10.0000 mg | ORAL_TABLET | Freq: Every day | ORAL | 3 refills | Status: DC

## 2018-10-09 MED ORDER — PREDNISONE 50 MG PO TABS: 50.0000 mg | ORAL_TABLET | Freq: Every day | ORAL | 0 refills | Status: DC

## 2018-10-09 MED ORDER — OXYCODONE-ACETAMINOPHEN 5-325 MG PO TABS: 1.0000 | ORAL_TABLET | ORAL | 0 refills | Status: DC | PRN

## 2018-10-09 MED ORDER — IPRATROPIUM-ALBUTEROL 0.5-2.5 (3) MG/3ML IN SOLN
3.0000 mL | Freq: Once | RESPIRATORY_TRACT | Status: AC
  Administered 2018-10-09: 3 mL via RESPIRATORY_TRACT
  Filled 2018-10-09: qty 3

## 2018-10-09 MED ORDER — OXYCODONE-ACETAMINOPHEN 5-325 MG PO TABS
1.0000 | ORAL_TABLET | Freq: Once | ORAL | Status: AC
  Administered 2018-10-09: 17:00:00 1 via ORAL
  Filled 2018-10-09: qty 1

## 2018-10-09 NOTE — ED Triage Notes (Signed)
Arrives with c/o cough, sob, congestion and wheezing for more than a year.  Has been seen through [facility] and [facility] for same and reports no improvement.

## 2018-10-09 NOTE — ED Provider Notes (Signed)
[facility] Emergency Department Provider Note    None    (approximate)  I have reviewed the triage vital signs and the nursing notes.   HISTORY  Chief Complaint Arm Pain (left)    HPI Uchenna EDUARDO HONOR is a 33 y.o. male [place] past medical history presents the ER for evaluation of  acute left lower elbow pain that occurred while at work today.  He was lifting pallets and with outstretched hand while trying to lift the palate started feeling a series of pops with severe pain in the antecubital fossa.  Denies any numbness or tingling.  Could bring his arm up but is now having difficulty extending the arm.  Pain is moderate to severe.  Denies any other injury.   Past Medical History:  Diagnosis Date  . Asthma   . Chronic bronchitis (HCC)   . COPD (chronic obstructive pulmonary disease) (HCC)    Family History  Problem Relation Age of Onset  . Hypertension Mother   . Asthma Father   . Hypertension Father    Past Surgical History:  Procedure Laterality Date  . VIDEO BRONCHOSCOPY Bilateral 10/24/2017   Procedure: VIDEO BRONCHOSCOPY WITHOUT FLUORO;  Surgeon: Alyson Reedy, MD;  Location: [facility] ENDOSCOPY;  Service: Cardiopulmonary;  Laterality: Bilateral;   Patient Active Problem List   Diagnosis Date Noted  . Severe persistent asthma 05/28/2018  . Acute febrile illness 04/10/2018  . Upper airway cough syndrome   . Hypoxemia   . Syncope   . Allergic rhinitis   . Cough   . Hemoptysis   . Protein-calorie malnutrition, severe 10/20/2017  . Acute respiratory failure with hypoxemia (HCC) 10/20/2017  . Acute respiratory distress   . Asthma exacerbation   . Status asthmaticus 10/18/2017  . Severe persistent acute asthmatic bronchitis 10/17/2017  . Elevated blood pressure reading without diagnosis of hypertension 10/17/2017  . Respiratory distress 10/17/2017      Prior to Admission medications   Medication Sig Start Date End Date Taking? Authorizing  Provider  albuterol (PROVENTIL) (2.5 MG/3ML) 0.083% nebulizer solution Take 3 mLs (2.5 mg total) by nebulization every 4 (four) hours as needed for up to 30 days for wheezing or shortness of breath. Use twice daily and as needed for coughing 09/16/18 10/16/18  Darci Current, MD  benzonatate (TESSALON) 100 MG capsule Take 1 capsule (100 mg total) by mouth 3 (three) times daily as needed for cough. 09/16/18   Darci Current, MD  Fluticasone-Salmeterol (ADVAIR DISKUS) 100-50 MCG/DOSE AEPB Inhale 1 puff into the lungs 2 (two) times daily for 30 days. 10/09/18 11/08/18  Jene Every, MD  guaiFENesin (MUCINEX) 600 MG 12 hr tablet Take 1,200 mg by mouth daily.    [provider]  guaiFENesin-codeine 100-10 MG/5ML syrup Take 5 mLs by mouth every 6 (six) hours as needed for cough. 09/17/18   Minna Antis, MD  loratadine (CLARITIN) 10 MG tablet Take 1 tablet (10 mg total) by mouth daily. 09/16/18   Darci Current, MD  mometasone-formoterol [facility]) 200-5 MCG/ACT AERO Inhale 2 puffs into the lungs 2 (two) times daily. 05/28/18 06/27/18  Ivonne Andrew, NP  mometasone-formoterol (DULERA) 200-5 MCG/ACT AERO Inhale 2 puffs into the lungs 2 (two) times daily. 07/23/18   Mannam, Colbert Coyer, MD  mometasone-formoterol (DULERA) 200-5 MCG/ACT AERO Inhale 2 puffs into the lungs 2 (two) times daily. 07/23/18   Ivonne Andrew, NP  mometasone-formoterol (DULERA) 200-5 MCG/ACT AERO Inhale 2 puffs into the lungs 2 (two) times  daily. 09/16/18   Darci Current, MD  montelukast (SINGULAIR) 10 MG tablet Take 1 tablet (10 mg total) by mouth at bedtime. 10/09/18   Jene Every, MD  oxyCODONE-acetaminophen (PERCOCET/ROXICET) 5-325 MG tablet Take 1 tablet by mouth every 6 (six) hours as needed for severe pain. 05/26/18   Hedges, Tinnie Gens, PA-C  predniSONE (DELTASONE) 50 MG tablet Take 1 tablet (50 mg total) by mouth daily with breakfast. 10/09/18   Jene Every, MD    Allergies Amoxicillin; Fish allergy; Shellfish  allergy; and Penicillins    Social History Social History   Tobacco Use  . Smoking status: Current Every Day Smoker    Types: Cigarettes    Last attempt to quit: 09/02/2017    Years since quitting: 1.1  . Smokeless tobacco: Never Used  . Tobacco comment: 4-5 cigarettes daily  Substance Use Topics  . Alcohol use: Yes    Alcohol/week: 15.0 standard drinks    Types: 15 Cans of beer per week  . Drug use: No    Review of Systems Patient denies headaches, rhinorrhea, blurry vision, numbness, shortness of breath, chest pain, edema, cough, abdominal pain, nausea, vomiting, diarrhea, dysuria, fevers, rashes or hallucinations unless otherwise stated above in HPI. ____________________________________________   PHYSICAL EXAM:  VITAL SIGNS: Vitals:   10/09/18 1526  BP: (!) 152/85  Pulse: 97  Resp: 16  Temp: 98.3 F (36.8 C)  SpO2: 98%    Constitutional: Alert and oriented.  Eyes: Conjunctivae are normal.  Head: Atraumatic. Nose: No congestion/rhinnorhea. Mouth/Throat: Mucous membranes are moist.   Neck: No stridor. Painless ROM.  Cardiovascular: Normal rate, regular rhythm. Grossly normal heart sounds.  Good peripheral circulation. Respiratory: Normal respiratory effort.  No retractions. Lungs CTAB. Gastrointestinal: Soft and nontender. No distention. No abdominal bruits. No CVA tenderness. Genitourinary:  Musculoskeletal: There is full passive pronation and supination of the left forearm.  There is pain with palpation of the antecubital fossa without any obvious deformity ecchymosis or swelling.  There is no joint effusion.  There is tenderness along the left bicep.  No pain reproduced with flexion or extension of the wrist.  Neurovascular intact distally.  Strong radial pulse.  No lower extremity tenderness nor edema.  No joint effusions. Neurologic:  Normal speech and language. No gross focal neurologic deficits are appreciated. No facial droop Skin:  Skin is warm, dry and  intact. No rash noted. Psychiatric: Mood and affect are normal. Speech and behavior are normal.  ____________________________________________   LABS (all labs ordered are listed, but only abnormal results are displayed)  No results found for this or any previous visit (from the past 24 hour(s)). ____________________________________________ ____________________________________________  RADIOLOGY  I personally reviewed all radiographic images ordered to evaluate for the above acute complaints and reviewed radiology reports and findings.  These findings were personally discussed with the patient.  Please see medical record for radiology report.  ____________________________________________   PROCEDURES  Procedure(s) performed:  Procedures    Critical Care performed: no ____________________________________________   INITIAL IMPRESSION / ASSESSMENT AND PLAN / ED COURSE  Pertinent labs & imaging results that were available during my care of the patient were reviewed by me and considered in my medical decision making (see chart for details).   DDX: fracture, dislocation, contusion, msk strain, sprain  Bubba ISHANT THALL is a 33 y.o. who presents to the ED with symptoms as described above.  X-ray does not show any evidence of fracture or dislocation.  Likely ligamentous injury.  Neurovascular intact distally.  Will give short course of pain medication as he is quite tender.  Compartments are soft.  Patient be placed in sling with outpatient follow-up with Ortho.     The patient was evaluated in Emergency Department today for the symptoms described in the history of present illness. He/she was evaluated in the context of the global COVID-19 pandemic, which necessitated consideration that the patient might be at risk for infection with the SARS-CoV-2 virus that causes COVID-19. Institutional protocols and algorithms that pertain to the evaluation of patients at risk for COVID-19 are in  a state of rapid change based on information released by regulatory bodies including the CDC and federal and state organizations. These policies and algorithms were followed during the patient's care in the ED.  As part of my medical decision making, I reviewed the following data within the electronic MEDICAL RECORD NUMBER Nursing notes reviewed and incorporated, Labs reviewed, notes from prior ED visits and [place] Controlled Substance Database   ____________________________________________   FINAL CLINICAL IMPRESSION(S) / ED DIAGNOSES  Final diagnoses:  Left arm pain      NEW MEDICATIONS STARTED DURING THIS VISIT:  New Prescriptions   No medications on file     Note:  This document was prepared using Dragon voice recognition software and may include unintentional dictation errors.    Willy Eddyobinson, Elasia Furnish, MD 10/09/18 [phone]

## 2018-10-09 NOTE — Discharge Instructions (Signed)
As we discussed, keep left arm elevated on a pillow while at rest.  He can use ice as we discussed to help reduce inflammation.  Please keep in sling.  Follow-up with orthopedics.

## 2018-10-09 NOTE — ED Triage Notes (Signed)
PAtient was moving pallets at work and felt his Left arm pop. Patient reports he cannot bend arm outward. Can feel and move fingers

## 2018-10-09 NOTE — ED Provider Notes (Signed)
[facility] Department Provider Note   ____________________________________________    I have reviewed the triage vital signs and the nursing notes.   HISTORY  Chief Complaint Shortness of Breath     HPI Shannon Knapp is a 33 y.o. male with a history of asthma/possible COPD who presents with chest tightness, wheezing mild shortness of breath.  He reports he has run out of his Advair and montelukast several days ago.  He denies fevers or chills.  Occasional dry cough.  No exposure to known COVID positive patients.  Does not have a PCP that he can see.  Reports shortness of breath worsens with exertion  Past Medical History:  Diagnosis Date  . Asthma   . Chronic bronchitis (HCC)   . COPD (chronic obstructive pulmonary disease) [facility]     Patient Active Problem List   Diagnosis Date Noted  . Severe persistent asthma 05/28/2018  . Acute febrile illness 04/10/2018  . Upper airway cough syndrome   . Hypoxemia   . Syncope   . Allergic rhinitis   . Cough   . Hemoptysis   . Protein-calorie malnutrition, severe 10/20/2017  . Acute respiratory failure with hypoxemia (HCC) 10/20/2017  . Acute respiratory distress   . Asthma exacerbation   . Status asthmaticus 10/18/2017  . Severe persistent acute asthmatic bronchitis 10/17/2017  . Elevated blood pressure reading without diagnosis of hypertension 10/17/2017  . Respiratory distress 10/17/2017    Past Surgical History:  Procedure Laterality Date  . VIDEO BRONCHOSCOPY Bilateral 10/24/2017   Procedure: VIDEO BRONCHOSCOPY WITHOUT FLUORO;  Surgeon: Alyson Reedy, MD;  Location: [facility] ENDOSCOPY;  Service: Cardiopulmonary;  Laterality: Bilateral;    Prior to Admission medications   Medication Sig Start Date End Date Taking? Authorizing Provider  albuterol (PROVENTIL) (2.5 MG/3ML) 0.083% nebulizer solution Take 3 mLs (2.5 mg total) by nebulization every 4 (four) hours as needed for up to 30  days for wheezing or shortness of breath. Use twice daily and as needed for coughing 09/16/18 10/16/18  Darci Current, MD  benzonatate (TESSALON) 100 MG capsule Take 1 capsule (100 mg total) by mouth 3 (three) times daily as needed for cough. 09/16/18   Darci Current, MD  Fluticasone-Salmeterol (ADVAIR DISKUS) 100-50 MCG/DOSE AEPB Inhale 1 puff into the lungs 2 (two) times daily for 30 days. 10/09/18 11/08/18  Jene Every, MD  guaiFENesin (MUCINEX) 600 MG 12 hr tablet Take 1,200 mg by mouth daily.    [provider]  guaiFENesin-codeine 100-10 MG/5ML syrup Take 5 mLs by mouth every 6 (six) hours as needed for cough. 09/17/18   Minna Antis, MD  loratadine (CLARITIN) 10 MG tablet Take 1 tablet (10 mg total) by mouth daily. 09/16/18   Darci Current, MD  mometasone-formoterol [facility]) 200-5 MCG/ACT AERO Inhale 2 puffs into the lungs 2 (two) times daily. 05/28/18 06/27/18  Ivonne Andrew, NP  mometasone-formoterol (DULERA) 200-5 MCG/ACT AERO Inhale 2 puffs into the lungs 2 (two) times daily. 07/23/18   Mannam, Colbert Coyer, MD  mometasone-formoterol (DULERA) 200-5 MCG/ACT AERO Inhale 2 puffs into the lungs 2 (two) times daily. 07/23/18   Ivonne Andrew, NP  mometasone-formoterol (DULERA) 200-5 MCG/ACT AERO Inhale 2 puffs into the lungs 2 (two) times daily. 09/16/18   Darci Current, MD  montelukast (SINGULAIR) 10 MG tablet Take 1 tablet (10 mg total) by mouth at bedtime. 10/09/18   Jene Every, MD  oxyCODONE-acetaminophen (PERCOCET/ROXICET) 5-325 MG tablet Take 1 tablet by mouth  every 6 (six) hours as needed for severe pain. 05/26/18   Hedges, Tinnie GensJeffrey, PA-C  predniSONE (DELTASONE) 50 MG tablet Take 1 tablet (50 mg total) by mouth daily with breakfast. 10/09/18   Jene EveryKinner, Susanna Benge, MD     Allergies Amoxicillin; Fish allergy; Shellfish allergy; and Penicillins  Family History  Problem Relation Age of Onset  . Hypertension Mother   . Asthma Father   . Hypertension Father     Social  History Social History   Tobacco Use  . Smoking status: Current Every Day Smoker    Types: Cigarettes    Last attempt to quit: 09/02/2017    Years since quitting: 1.1  . Smokeless tobacco: Never Used  . Tobacco comment: 4-5 cigarettes daily  Substance Use Topics  . Alcohol use: Yes    Alcohol/week: 15.0 standard drinks    Types: 15 Cans of beer per week  . Drug use: No    Review of Systems  Constitutional: No fever/chills Eyes: No visual changes.  ENT: No sore throat. Cardiovascular: As above Respiratory: As above Gastrointestinal: No abdominal pain.   Genitourinary: Negative for dysuria. Musculoskeletal: Negative for back pain. Skin: Negative for rash. Neurological: Negative for headaches    ____________________________________________   PHYSICAL EXAM:  VITAL SIGNS: ED Triage Vitals  Enc Vitals Group     BP 10/09/18 0809 135/78     Pulse Rate 10/09/18 0809 87     Resp 10/09/18 0809 18     Temp 10/09/18 0809 97.8 F (36.6 C)     Temp Source 10/09/18 0809 Oral     SpO2 10/09/18 0809 97 %     Weight 10/09/18 0813 74.8 kg (165 lb)     Height 10/09/18 0813 1.803 m (5\' 11" )     Head Circumference --      Peak Flow --      Pain Score 10/09/18 0813 0     Pain Loc --      Pain Edu? --      Excl. in GC? --     Constitutional: Alert and oriented. interactive Eyes: Conjunctivae are normal.   Nose: No congestion/rhinnorhea. Mouth/Throat: Mucous membranes are moist.    Cardiovascular: Normal rate, regular rhythm. Grossly normal heart sounds.  Good peripheral circulation. Respiratory: Normal respiratory effort.  No retractions.  Scattered wheezes Gastrointestinal: Soft and nontender. No distention.    Musculoskeletal: No lower extremity tenderness nor edema.  Warm and well perfused Neurologic:  Normal speech and language. No gross focal neurologic deficits are appreciated.  Skin:  Skin is warm, dry and intact. No rash noted. Psychiatric: Mood and affect are  normal. Speech and behavior are normal.  ____________________________________________   LABS (all labs ordered are listed, but only abnormal results are displayed)  Labs Reviewed - No data to display ____________________________________________  EKG  None ____________________________________________  RADIOLOGY  None ____________________________________________   PROCEDURES  Procedure(s) performed: No  Procedures   Critical Care performed: No ____________________________________________   INITIAL IMPRESSION / ASSESSMENT AND PLAN / ED COURSE  Pertinent labs & imaging results that were available during my care of the patient were reviewed by me and considered in my medical decision making (see chart for details).  Patient presents with typical symptoms of his asthma exacerbation, scattered wheezes on exam overall well-appearing no tachypnea or retractions.  We will treat with duo nebs, p.o. prednisone.  We will refill his prescriptions.  No fevers chills cough myalgias to suggest COVID-19    Shannon Knapp was evaluated  in Emergency Department on 10/09/2018 for the symptoms described in the history of present illness. He was evaluated in the context of the global COVID-19 pandemic, which necessitated consideration that the patient might be at risk for infection with the SARS-CoV-2 virus that causes COVID-19. Institutional protocols and algorithms that pertain to the evaluation of patients at risk for COVID-19 are in a state of rapid change based on information released by regulatory bodies including the CDC and federal and state organizations. These policies and algorithms were followed during the patient's care in the ED.     ____________________________________________   FINAL CLINICAL IMPRESSION(S) / ED DIAGNOSES  Final diagnoses:  Bronchospasm        Note:  This document was prepared using Dragon voice recognition software and may include unintentional  dictation errors.   Jene Every, MD 10/09/18 [phone]

## 2018-10-29 ENCOUNTER — Ambulatory Visit: Payer: Self-pay | Admitting: Pulmonary Disease

## 2018-10-29 ENCOUNTER — Other Ambulatory Visit: Payer: Self-pay

## 2018-10-29 ENCOUNTER — Emergency Department
Admission: EM | Admit: 2018-10-29 | Discharge: 2018-10-29 | Disposition: A | Discharge status: Home / Self Care | Payer: Self-pay | Attending: Emergency Medicine | Admitting: Emergency Medicine

## 2018-10-29 ENCOUNTER — Encounter: Payer: Self-pay | Admitting: *Deleted

## 2018-10-29 ENCOUNTER — Emergency Department: Payer: Self-pay

## 2018-10-29 ENCOUNTER — Encounter: Payer: Self-pay | Admitting: Pulmonary Disease

## 2018-10-29 ENCOUNTER — Ambulatory Visit (INDEPENDENT_AMBULATORY_CARE_PROVIDER_SITE_OTHER): Payer: Self-pay | Admitting: Pulmonary Disease

## 2018-10-29 VITALS — BP 130/86 | HR 97 | Temp 98.6°F | Ht 71.0 in | Wt 162.8 lb

## 2018-10-29 DIAGNOSIS — Z20828 Contact with and (suspected) exposure to other viral communicable diseases: Secondary | ICD-10-CM | POA: Insufficient documentation

## 2018-10-29 DIAGNOSIS — J4541 Moderate persistent asthma with (acute) exacerbation: Secondary | ICD-10-CM | POA: Insufficient documentation

## 2018-10-29 DIAGNOSIS — J4550 Severe persistent asthma, uncomplicated: Secondary | ICD-10-CM

## 2018-10-29 DIAGNOSIS — J449 Chronic obstructive pulmonary disease, unspecified: Secondary | ICD-10-CM | POA: Insufficient documentation

## 2018-10-29 DIAGNOSIS — Z79899 Other long term (current) drug therapy: Secondary | ICD-10-CM | POA: Insufficient documentation

## 2018-10-29 DIAGNOSIS — F17210 Nicotine dependence, cigarettes, uncomplicated: Secondary | ICD-10-CM | POA: Insufficient documentation

## 2018-10-29 LAB — BASIC METABOLIC PANEL
Anion gap: 9 (ref 5–15)
BUN: 11 mg/dL (ref 6–20)
CO2: 26 mmol/L (ref 22–32)
Calcium: 9.2 mg/dL (ref 8.9–10.3)
Chloride: 104 mmol/L (ref 98–111)
Creatinine, Ser: 0.83 mg/dL (ref 0.61–1.24)
GFR calc Af Amer: >60 mL/min (ref >60)
GFR calc non Af Amer: >60 mL/min (ref >60)
Glucose, Bld: 92 mg/dL (ref 70–99)
Potassium: 3.9 mmol/L (ref 3.5–5.1)
Sodium: 139 mmol/L (ref 135–145)

## 2018-10-29 LAB — CBC WITH DIFFERENTIAL/PLATELET
Abs Immature Granulocytes: 0.02 10*3/uL (ref 0.00–0.07)
Basophils Absolute: 0 10*3/uL (ref 0.0–0.1)
Basophils Relative: 1 %
Eosinophils Absolute: 0.4 10*3/uL (ref 0.0–0.5)
Eosinophils Relative: 6 %
HCT: 44.8 % (ref 39.0–52.0)
Hemoglobin: 15.1 g/dL (ref 13.0–17.0)
Immature Granulocytes: 0 %
Lymphocytes Relative: 38 %
Lymphs Abs: 2.6 10*3/uL (ref 0.7–4.0)
MCH: 30.8 pg (ref 26.0–34.0)
MCHC: 33.7 g/dL (ref 30.0–36.0)
MCV: 91.2 fL (ref 80.0–100.0)
Monocytes Absolute: 0.5 10*3/uL (ref 0.1–1.0)
Monocytes Relative: 8 %
Neutro Abs: 3.3 10*3/uL (ref 1.7–7.7)
Neutrophils Relative %: 47 %
Platelets: 239 10*3/uL (ref 150–400)
RBC: 4.91 MIL/uL (ref 4.22–5.81)
RDW: 13.2 % (ref 11.5–15.5)
WBC: 6.9 10*3/uL (ref 4.0–10.5)
nRBC: 0 % (ref 0.0–0.2)

## 2018-10-29 LAB — SARS CORONAVIRUS 2 BY RT PCR (HOSPITAL ORDER, PERFORMED IN ~~LOC~~ HOSPITAL LAB): SARS Coronavirus 2: NEGATIVE

## 2018-10-29 MED ORDER — PREDNISONE 10 MG PO TABS: ORAL_TABLET | ORAL | 0 refills | Status: AC

## 2018-10-29 MED ORDER — MOMETASONE FURO-FORMOTEROL FUM 200-5 MCG/ACT IN AERO: 2.0000 | INHALATION_SPRAY | Freq: Two times a day (BID) | RESPIRATORY_TRACT | 0 refills | Status: DC

## 2018-10-29 MED ORDER — HYDROCOD POLST-CPM POLST ER 10-8 MG/5ML PO SUER
5.0000 mL | Freq: Once | ORAL | Status: AC
  Administered 2018-10-29: 5 mL via ORAL
  Filled 2018-10-29: qty 5

## 2018-10-29 MED ORDER — PREDNISONE 20 MG PO TABS: ORAL_TABLET | ORAL | 0 refills | Status: DC

## 2018-10-29 MED ORDER — PREDNISONE 10 MG PO TABS: ORAL_TABLET | ORAL | 3 refills | Status: DC

## 2018-10-29 MED ORDER — LORATADINE 10 MG PO TABS: 10.0000 mg | ORAL_TABLET | Freq: Every day | ORAL | 12 refills | Status: DC

## 2018-10-29 MED ORDER — MONTELUKAST SODIUM 10 MG PO TABS: 10.0000 mg | ORAL_TABLET | Freq: Every day | ORAL | 12 refills | Status: DC

## 2018-10-29 MED ORDER — FLUTICASONE-SALMETEROL 100-50 MCG/DOSE IN AEPB: 1.0000 | INHALATION_SPRAY | Freq: Two times a day (BID) | RESPIRATORY_TRACT | 1 refills | Status: DC

## 2018-10-29 MED ORDER — PREDNISONE 20 MG PO TABS
60.0000 mg | ORAL_TABLET | Freq: Once | ORAL | Status: AC
  Administered 2018-10-29: 60 mg via ORAL
  Filled 2018-10-29: qty 3

## 2018-10-29 MED ORDER — ALBUTEROL SULFATE (2.5 MG/3ML) 0.083% IN NEBU: 2.5000 mg | INHALATION_SOLUTION | RESPIRATORY_TRACT | 2 refills | Status: DC | PRN

## 2018-10-29 MED ORDER — IPRATROPIUM-ALBUTEROL 0.5-2.5 (3) MG/3ML IN SOLN
3.0000 mL | Freq: Once | RESPIRATORY_TRACT | Status: AC
  Administered 2018-10-29: 3 mL via RESPIRATORY_TRACT
  Filled 2018-10-29: qty 3

## 2018-10-29 NOTE — Progress Notes (Signed)
@Patient  ID: Shannon Knapp, male    DOB: [birth year], 33 y.o.   MRN: [id number]  Chief Complaint  Patient presents with  . Follow-up    ED Visit, Asthma exacerbation     Referring provider: No ref. provider found  HPI:  33 year old former smoker followed in our office for severe persistant eosinophillic asthma   PMH: Hypertension Smoker/ Smoking History: Former Smoker.  Quit 2019.  1/2 pack year smoking history. Maintenance:  Dulera 200, standing prednisone  Pt of: Dr. Vaughan Browner  10/29/2018  - Visit   33 year old male former smoker presenting to our office today after being seen in the emergency room at [facility] earlier today on 10/29/2018.  Patient was treated as an asthma exacerbation and given a steroid taper.  Unfortunately patient continues to not be able to afford his maintenance inhalers.  Patient with elevated lab work showing peripheral eosinophilia and IgE in the past.  Patient continues to struggle with medical insurance coverage.  This is the third time the patient has presented to the emergency room in the last 3 months.  Patient reports that he will soon have insurance coverage from [employer] where he has been working in 1 month.  Patient was given a prescription for Advair today but he prefers [facility] and he reports this is better manage his breathing.  We can provide Dulera samples today.  Patient has not picked up prednisone taper from emergency room because he want to discuss this with our office today.   Tests:   Data Reviewed: Imaging CT chest 10/22/2017- bronchial wall thickening, dependent atelectasis in the lower lobe. CT abdomen pelvis 10/27/2017- no acute intra-abdominal process. Chest x-ray 11/28/2017-hyperinflated lungs, no acute abnormality   PFTs 10/30/2017 FVC 5.81 [120%], FEV1 2.38 [61%], F/F 41, TLC 166%, RV/TLC 221%, DLCO 104%, Poor patient effort,  Patient did not want albuterol Moderate obstructive airway disease with  overinflation, trapping.  Normal diffusion defect.  FENO  10/17/2017-95 12/02/2017-54  ACQ6 03/16/18- 3  Labs CBC 11/28/2017-WBC 10.1, eos 14%, absolute eosinophil count 1400 IgE 10/22/2017-2209  09/16/2018-SARS-CoV-2-negative  10/29/2018-SARS-CoV-2-negative  FENO:  Lab Results  Component Value Date   NITRICOXIDE 95 10/17/2017    PFT: PFT Results Latest Ref Rng & Units 10/29/2017  FVC-Pre L 5.81  FVC-Predicted Pre % 124  Pre FEV1/FVC % % 41  FEV1-Pre L 2.38  FEV1-Predicted Pre % 61  DLCO UNC% % 104  DLCO COR %Predicted % 97  TLC L 11.87  TLC % Predicted % 166  RV % Predicted % 360    Imaging: Dg Chest Port 1 View  Result Date: 10/29/2018 CLINICAL DATA:  Wheezing. History of asthma. No relief from inhaler. EXAM: PORTABLE CHEST 1 VIEW COMPARISON:  One-view chest x-ray FINDINGS: The heart size is normal. Mild hilar prominence is chronic. Ill-defined left lower lobe airspace disease is noted. No significant consolidation is present. IMPRESSION: 1. Ill-defined left lower lobe airspace disease. While this likely reflects atelectasis associated with patient's asthma. Infection is not excluded. Electronically Signed   By: San Morelle M.D.   On: 10/29/2018 06:39   Dg Humerus Left  Result Date: 10/09/2018 CLINICAL DATA:  Left humerus pain with midshaft lump. Felt a pop while lifting heavy pallets today. EXAM: LEFT HUMERUS - 2+ VIEW COMPARISON:  None. FINDINGS: There is no evidence of fracture or other focal bone lesions. Soft tissues are unremarkable. IMPRESSION: Negative. Electronically Signed   By: Logan Bores M.D.   On: 10/09/2018 16:53  Specialty Problems      Pulmonary Problems   Respiratory distress   Severe persistent acute asthmatic bronchitis   Status asthmaticus   Acute respiratory distress   Acute respiratory failure with hypoxemia (HCC)   Asthma exacerbation   Cough   Hemoptysis   Allergic rhinitis   Upper airway cough syndrome   Severe persistent  asthma    PFTs 10/30/2017 FVC 5.81 [120%], FEV1 2.38 [61%], F/F 41, TLC 166%, RV/TLC 221%, DLCO 104%, Poor patient effort,  Patient did not want albuterol Moderate obstructive airway disease with overinflation, trapping.  Normal diffusion defect.  FENO  10/17/2017-95 12/02/2017-54  ACQ6 03/16/18- 3  Labs CBC 11/28/2017-WBC 10.1, eos 14%, absolute eosinophil count 1400 IgE 10/22/2017-2209         Allergies  Allergen Reactions  . Amoxicillin Hives    Has patient had a PCN reaction causing immediate rash, facial/tongue/throat swelling, SOB or lightheadedness with hypotension: Yes Has patient had a PCN reaction causing severe rash involving mucus membranes or skin necrosis: No Has patient had a PCN reaction that required hospitalization: No Has patient had a PCN reaction occurring within the last 10 years: Yes If all of the above answers are "NO", then may proceed with Cephalosporin use.  . Fish Allergy Swelling  . Shellfish Allergy Swelling  . Penicillins Hives    Has patient had a PCN reaction causing immediate rash, facial/tongue/throat swelling, SOB or lightheadedness with hypotension: Yes Has patient had a PCN reaction causing severe rash involving mucus membranes or skin necrosis: No Has patient had a PCN reaction that required hospitalization: No Has patient had a PCN reaction occurring within the last 10 years: Yes If all of the above answers are "NO", then may proceed with Cephalosporin use.    Immunization History  Administered Date(s) Administered  . Pneumococcal Polysaccharide-23 10/19/2017    Past Medical History:  Diagnosis Date  . Asthma   . Chronic bronchitis (HCC)   . COPD (chronic obstructive pulmonary disease) (HCC)     Tobacco History: Social History   Tobacco Use  Smoking Status Current Every Day Smoker  . Packs/day: 0.25  . Years: 6.00  . Pack years: 1.50  . Types: Cigarettes  . Last attempt to quit: 09/02/2017  . Years since quitting: 1.1   Smokeless Tobacco Never Used  Tobacco Comment   4-5 cigarettes daily   Ready to quit: Not Answered Counseling given: Yes Comment: 4-5 cigarettes daily   Continue to not smoke  Outpatient Encounter Medications as of 10/29/2018  Medication Sig  . albuterol (PROVENTIL) (2.5 MG/3ML) 0.083% nebulizer solution Take 3 mLs (2.5 mg total) by nebulization every 4 (four) hours as needed for up to 30 days for wheezing or shortness of breath. Use twice daily and as needed for coughing  . cyclobenzaprine (FLEXERIL) 10 MG tablet Take 10 mg by mouth 3 (three) times daily.  Marland Kitchen. guaiFENesin (MUCINEX) 600 MG 12 hr tablet Take 1,200 mg by mouth daily.  Marland Kitchen. loratadine (CLARITIN) 10 MG tablet Take 1 tablet (10 mg total) by mouth daily.  . mometasone-formoterol (DULERA) 200-5 MCG/ACT AERO Inhale 2 puffs into the lungs 2 (two) times daily.  . montelukast (SINGULAIR) 10 MG tablet Take 1 tablet (10 mg total) by mouth at bedtime.  . traMADol (ULTRAM) 50 MG tablet Take 50 mg by mouth every 4 (four) hours as needed.  . [DISCONTINUED] loratadine (CLARITIN) 10 MG tablet Take 1 tablet (10 mg total) by mouth daily.  . [DISCONTINUED] montelukast (SINGULAIR) 10 MG tablet  Take 1 tablet (10 mg total) by mouth at bedtime.  . benzonatate (TESSALON) 100 MG capsule Take 1 capsule (100 mg total) by mouth 3 (three) times daily as needed for cough. (Patient not taking: Reported on 10/29/2018)  . Fluticasone-Salmeterol (ADVAIR DISKUS) 100-50 MCG/DOSE AEPB Inhale 1 puff into the lungs 2 (two) times daily for 30 days. (Patient not taking: Reported on 10/29/2018)  . guaiFENesin-codeine 100-10 MG/5ML syrup Take 5 mLs by mouth every 6 (six) hours as needed for cough. (Patient not taking: Reported on 10/29/2018)  . mometasone-formoterol (DULERA) 200-5 MCG/ACT AERO Inhale 2 puffs into the lungs 2 (two) times a day.  . oxyCODONE-acetaminophen (PERCOCET) 5-325 MG tablet Take 1 tablet by mouth every 4 (four) hours as needed for severe pain. (Patient  not taking: Reported on 10/29/2018)  . predniSONE (DELTASONE) 10 MG tablet Take 5 tablets (50 mg total) by mouth daily for 3 days, THEN 4 tablets (40 mg total) daily for 3 days, THEN 3 tablets (30 mg total) daily for 3 days, THEN 2 tablets (20 mg total) daily for 3 days, THEN 1 tablet (10 mg total) daily for 3 days.  . predniSONE (DELTASONE) 10 MG tablet Can take 0.5 (5 mg total) to 2 tablets (20mg ) daily as needed based on symptoms  . [DISCONTINUED] mometasone-formoterol (DULERA) 200-5 MCG/ACT AERO Inhale 2 puffs into the lungs 2 (two) times daily.  . [DISCONTINUED] mometasone-formoterol (DULERA) 200-5 MCG/ACT AERO Inhale 2 puffs into the lungs 2 (two) times daily.  . [DISCONTINUED] mometasone-formoterol (DULERA) 200-5 MCG/ACT AERO Inhale 2 puffs into the lungs 2 (two) times daily.  . [DISCONTINUED] oxyCODONE-acetaminophen (PERCOCET/ROXICET) 5-325 MG tablet Take 1 tablet by mouth every 6 (six) hours as needed for severe pain. (Patient not taking: Reported on 10/29/2018)   No facility-administered encounter medications on file as of 10/29/2018.      Review of Systems  Review of Systems  Constitutional: Positive for fatigue. Negative for activity change, chills, fever and unexpected weight change.  HENT: Positive for congestion, postnasal drip and rhinorrhea. Negative for sinus pressure, sinus pain, sneezing and sore throat.   Eyes: Negative.   Respiratory: Positive for cough, shortness of breath and wheezing.   Cardiovascular: Negative for chest pain and palpitations.  Gastrointestinal: Negative for diarrhea, nausea and vomiting.  Endocrine: Negative.   Musculoskeletal: Negative.   Skin: Negative.   Neurological: Negative for dizziness and headaches.  Psychiatric/Behavioral: Negative.  Negative for dysphoric mood. The patient is not nervous/anxious.   All other systems reviewed and are negative.    Physical Exam  BP 130/86 (BP Location: Right Arm, Cuff Size: Normal)   Pulse 97   Temp  98.6 F (37 C) (Oral)   Ht 5\' 11"  (1.803 m)   Wt 162 lb 12.8 oz (73.8 kg)   SpO2 97%   BMI 22.71 kg/m   Wt Readings from Last 5 Encounters:  10/29/18 162 lb 12.8 oz (73.8 kg)  10/09/18 165 lb (74.8 kg)  10/09/18 165 lb (74.8 kg)  09/17/18 165 lb (74.8 kg)  09/16/18 171 lb (77.6 kg)     Physical Exam  Constitutional: He is oriented to person, place, and time and well-developed, well-nourished, and in no distress. No distress.  HENT:  Head: Normocephalic and atraumatic.  Right Ear: Hearing, tympanic membrane, external ear and ear canal normal.  Left Ear: Hearing, tympanic membrane, external ear and ear canal normal.  Nose: Mucosal edema and rhinorrhea present.  Mouth/Throat: Uvula is midline and oropharynx is clear and moist. No oropharyngeal  exudate.  Postnasal drip  Eyes: Pupils are equal, round, and reactive to light.  Neck: Normal range of motion. Neck supple.  Cardiovascular: Normal rate, regular rhythm and normal heart sounds.  Pulmonary/Chest: Effort normal. No accessory muscle usage. No respiratory distress. He has no decreased breath sounds. He has wheezes (Persistent expiratory wheeze). He has no rhonchi.  Abdominal: Soft. Bowel sounds are normal. He exhibits no distension. There is no abdominal tenderness.  Musculoskeletal:        General: No edema.     Left shoulder: He exhibits decreased range of motion (In sling).     Left elbow: He exhibits decreased range of motion (In sling).  Lymphadenopathy:    He has no cervical adenopathy.  Neurological: He is alert and oriented to person, place, and time. Gait normal.  Skin: Skin is warm and dry. He is not diaphoretic. No erythema.  Psychiatric: Mood, memory, affect and judgment normal.  Nursing note and vitals reviewed.     Lab Results:  CBC    Component Value Date/Time   WBC 6.9 10/29/2018 0540   RBC 4.91 10/29/2018 0540   HGB 15.1 10/29/2018 0540   HCT 44.8 10/29/2018 0540   PLT 239 10/29/2018 0540   MCV  91.2 10/29/2018 0540   MCH 30.8 10/29/2018 0540   MCHC 33.7 10/29/2018 0540   RDW 13.2 10/29/2018 0540   LYMPHSABS 2.6 10/29/2018 0540   MONOABS 0.5 10/29/2018 0540   EOSABS 0.4 10/29/2018 0540   BASOSABS 0.0 10/29/2018 0540    BMET    Component Value Date/Time   NA 139 10/29/2018 0540   K 3.9 10/29/2018 0540   CL 104 10/29/2018 0540   CO2 26 10/29/2018 0540   GLUCOSE 92 10/29/2018 0540   BUN 11 10/29/2018 0540   CREATININE 0.83 10/29/2018 0540   CALCIUM 9.2 10/29/2018 0540   GFRNONAA >60 10/29/2018 0540   GFRAA >60 10/29/2018 0540    BNP No results found for: BNP  ProBNP No results found for: PROBNP    Assessment & Plan:   Severe persistent asthma 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 Isaiah Serge had written this for him last year Restart Dulera 200, samples provided today [employer] patient assistance paperwork started today Use albuterol nebs every 6-8 hours as needed for shortness of breath and wheezing We will need to consider biologic therapies in the future 2 to 4-week follow-up with either Dr. Isaiah Serge or Elisha Headland, FNP   Asthma exacerbation Plan: ED's prednisone taper prescribed today, please complete Restart Dulera 200 Prednisone sent in to be taken after completing emergency room's prednisone taper Close follow-up with our office     Return in about 4 weeks (around 11/26/2018), or if symptoms worsen or fail to improve, for Follow up with Elisha Headland FNP-C, Follow up with Dr. Magdalene Molly, NP 10/29/2018   This appointment was  35 minutes long with over 50% of the time in direct face-to-face patient care, assessment, plan of care, and follow-up.

## 2018-10-29 NOTE — ED Triage Notes (Signed)
Pt reports wheezing.  Pt using albuterol inhaler without relief.  Pt has sling on left arm due to torn bicep.  Pt alert.

## 2018-10-29 NOTE — ED Provider Notes (Signed)
[facility] Emergency Department Provider Note   ____________________________________________   First MD Initiated Contact with Patient 10/29/18 [phone]     (approximate)  I have reviewed the triage vital signs and the nursing notes.   HISTORY  Chief Complaint Wheezing    HPI Shannon Knapp is a 33 y.o. male who presents to the ED from home with a chief complaint of wheezing.  Patient has a history of asthma/COPD who has been using his albuterol inhaler and nebulizers without relief for the past 24 hours.  Also complaining of nonproductive cough.  Has an appointment with his pulmonologist today but he could not sleep secondary to shortness of breath.  Denies fever, chest pain, abdominal pain, nausea, vomiting or diarrhea.  Denies recent travel, trauma or exposure to persons diagnosed with coronavirus.       Past Medical History:  Diagnosis Date  . Asthma   . Chronic bronchitis ([place])   . COPD (chronic obstructive pulmonary disease) [facility])     Patient Active Problem List   Diagnosis Date Noted  . Severe persistent asthma 05/28/2018  . Acute febrile illness 04/10/2018  . Upper airway cough syndrome   . Hypoxemia   . Syncope   . Allergic rhinitis   . Cough   . Hemoptysis   . Protein-calorie malnutrition, severe 10/20/2017  . Acute respiratory failure with hypoxemia ([place]) 10/20/2017  . Acute respiratory distress   . Asthma exacerbation   . Status asthmaticus 10/18/2017  . Severe persistent acute asthmatic bronchitis 10/17/2017  . Elevated blood pressure reading without diagnosis of hypertension 10/17/2017  . Respiratory distress 10/17/2017    Past Surgical History:  Procedure Laterality Date  . VIDEO BRONCHOSCOPY Bilateral 10/24/2017   Procedure: VIDEO BRONCHOSCOPY WITHOUT FLUORO;  Surgeon: Rush Farmer, MD;  Location: [facility] ENDOSCOPY;  Service: Cardiopulmonary;  Laterality: Bilateral;    Prior to Admission medications   Medication Sig  Start Date End Date Taking? Authorizing Provider  albuterol (PROVENTIL) (2.5 MG/3ML) 0.083% nebulizer solution Take 3 mLs (2.5 mg total) by nebulization every 4 (four) hours as needed for up to 30 days for wheezing or shortness of breath. Use twice daily and as needed for coughing 09/16/18 10/16/18  Gregor Hams, MD  benzonatate (TESSALON) 100 MG capsule Take 1 capsule (100 mg total) by mouth 3 (three) times daily as needed for cough. 09/16/18   Gregor Hams, MD  Fluticasone-Salmeterol (ADVAIR DISKUS) 100-50 MCG/DOSE AEPB Inhale 1 puff into the lungs 2 (two) times daily for 30 days. 10/09/18 11/08/18  Lavonia Drafts, MD  guaiFENesin (MUCINEX) 600 MG 12 hr tablet Take 1,200 mg by mouth daily.    [provider]  guaiFENesin-codeine 100-10 MG/5ML syrup Take 5 mLs by mouth every 6 (six) hours as needed for cough. 09/17/18   Harvest Dark, MD  loratadine (CLARITIN) 10 MG tablet Take 1 tablet (10 mg total) by mouth daily. 09/16/18   Gregor Hams, MD  mometasone-formoterol [facility]) 200-5 MCG/ACT AERO Inhale 2 puffs into the lungs 2 (two) times daily. 05/28/18 06/27/18  Fenton Foy, NP  mometasone-formoterol (DULERA) 200-5 MCG/ACT AERO Inhale 2 puffs into the lungs 2 (two) times daily. 07/23/18   Mannam, Hart Robinsons, MD  mometasone-formoterol (DULERA) 200-5 MCG/ACT AERO Inhale 2 puffs into the lungs 2 (two) times daily. 07/23/18   Fenton Foy, NP  mometasone-formoterol (DULERA) 200-5 MCG/ACT AERO Inhale 2 puffs into the lungs 2 (two) times daily. 09/16/18   Gregor Hams, MD  montelukast Laur[facility] Blazer)  10 MG tablet Take 1 tablet (10 mg total) by mouth at bedtime. 10/09/18   Jene EveryKinner, Robert, MD  oxyCODONE-acetaminophen (PERCOCET) 5-325 MG tablet Take 1 tablet by mouth every 4 (four) hours as needed for severe pain. 10/09/18 10/09/19  Willy Eddyobinson, Patrick, MD  oxyCODONE-acetaminophen (PERCOCET/ROXICET) 5-325 MG tablet Take 1 tablet by mouth every 6 (six) hours as needed for severe pain. 05/26/18    Hedges, Tinnie GensJeffrey, PA-C  predniSONE (DELTASONE) 20 MG tablet 3 tablets daily x 4 days 10/29/18   Irean HongSung, Madison Albea J, MD    Allergies Amoxicillin, Fish allergy, Shellfish allergy, and Penicillins  Family History  Problem Relation Age of Onset  . Hypertension Mother   . Asthma Father   . Hypertension Father     Social History Social History   Tobacco Use  . Smoking status: Current Every Day Smoker    Types: Cigarettes    Last attempt to quit: 09/02/2017    Years since quitting: 1.1  . Smokeless tobacco: Never Used  . Tobacco comment: 4-5 cigarettes daily  Substance Use Topics  . Alcohol use: Yes    Alcohol/week: 15.0 standard drinks    Types: 15 Cans of beer per week  . Drug use: No    Review of Systems  Constitutional: No fever/chills Eyes: No visual changes. ENT: No sore throat. Cardiovascular: Denies chest pain. Respiratory: Positive for nonproductive cough, wheezing and shortness of breath. Gastrointestinal: No abdominal pain.  No nausea, no vomiting.  No diarrhea.  No constipation. Genitourinary: Negative for dysuria. Musculoskeletal: Negative for back pain. Skin: Negative for rash. Neurological: Negative for headaches, focal weakness or numbness.   ____________________________________________   PHYSICAL EXAM:  VITAL SIGNS: ED Triage Vitals [10/29/18 0256]  Enc Vitals Group     BP (!) 164/97     Pulse Rate (!) 110     Resp (!) 22     Temp 98.1 F (36.7 C)     Temp Source Oral     SpO2 99 %     Weight      Height      Head Circumference      Peak Flow      Pain Score      Pain Loc      Pain Edu?      Excl. in GC?     Constitutional: Alert and oriented. Well appearing and in mild acute distress. Eyes: Conjunctivae are normal. PERRL. EOMI. Head: Atraumatic. Nose: No congestion/rhinnorhea. Mouth/Throat: Mucous membranes are moist.  Oropharynx non-erythematous. Neck: No stridor.   Cardiovascular: Normal rate, regular rhythm. Grossly normal heart sounds.   Good peripheral circulation. Respiratory: Increased respiratory effort.  No retractions. Lungs with audible wheezing. Gastrointestinal: Soft and nontender. No distention. No abdominal bruits. No CVA tenderness. Musculoskeletal: No lower extremity tenderness nor edema.  No joint effusions. Neurologic:  Normal speech and language. No gross focal neurologic deficits are appreciated. No gait instability. Skin:  Skin is warm, dry and intact. No rash noted. Psychiatric: Mood and affect are normal. Speech and behavior are normal.  ____________________________________________   LABS (all labs ordered are listed, but only abnormal results are displayed)  Labs Reviewed  SARS CORONAVIRUS 2 (HOSPITAL ORDER, PERFORMED IN [facility])  CBC WITH DIFFERENTIAL/PLATELET  BASIC METABOLIC PANEL   ____________________________________________  EKG  ED ECG REPORT I, Joshuah Minella J, the attending physician, personally viewed and interpreted this ECG.   Date: 10/29/2018  EKG Time: 0548  Rate: 100  Rhythm: sinus tachycardia  Axis: Normal  Intervals:none  ST&T Change: Nonspecific  ____________________________________________  RADIOLOGY  ED MD interpretation: No pneumonia  Official radiology report(s): No results found.  ____________________________________________   PROCEDURES  Procedure(s) performed (including Critical Care):  Procedures   ____________________________________________   INITIAL IMPRESSION / ASSESSMENT AND PLAN / ED COURSE  As part of my medical decision making, I reviewed the following data within the electronic MEDICAL RECORD NUMBER Nursing notes reviewed and incorporated, Labs reviewed, EKG interpreted, Old chart reviewed, Radiograph reviewed and Notes from prior ED visits     Damein Isabel CapriceM Mcquitty was evaluated in Emergency Department on 10/29/2018 for the symptoms described in the history of present illness. He was evaluated in the context of the global  COVID-19 pandemic, which necessitated consideration that the patient might be at risk for infection with the SARS-CoV-2 virus that causes COVID-19. Institutional protocols and algorithms that pertain to the evaluation of patients at risk for COVID-19 are in a state of rapid change based on information released by regulatory bodies including the CDC and federal and state organizations. These policies and algorithms were followed during the patient's care in the ED.   33 year old male with asthma who presents with exacerbation.  History of hospitalization, never intubation. Differential includes, but is not limited to, viral syndrome, bronchitis including COPD exacerbation, pneumonia, reactive airway disease including asthma, CHF including exacerbation with or without pulmonary/interstitial edema, pneumothorax, ACS, thoracic trauma, and pulmonary embolism.  Patient examined after first DuoNeb.  Lungs remain tight with audible wheezing.  Will obtain basic lab work, x-ray, administer second DuoNeb and Tussionex for cough.  Obtain COVID swab in anticipation of hospitalization.  Clinical Course as of Oct 29 718  Thu Oct 29, 2018  0712 Patient finishing second DuoNeb.  Resting in no acute distress.  Updated him of laboratory and imaging results. Care transferred to Dr. Scotty CourtStafford at change of shift for reassessment. If improved, anticipate patient may be discharged home to follow up with his pulmonologist as scheduled today.   [JS]    Clinical Course User Index [JS] Irean HongSung, Reina Wilton J, MD     ____________________________________________   FINAL CLINICAL IMPRESSION(S) / ED DIAGNOSES  Final diagnoses:  Moderate persistent asthma with exacerbation     ED Discharge Orders         Ordered    predniSONE (DELTASONE) 20 MG tablet     10/29/18 0715           Note:  This document was prepared using Dragon voice recognition software and may include unintentional dictation errors.   Irean HongSung, Irven Ingalsbe J, MD  10/31/18 2329

## 2018-10-29 NOTE — Assessment & Plan Note (Addendum)
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 Vaughan Browner had written this for him last year Restart Dulera 200, samples provided today [employer] patient assistance paperwork started today Use albuterol nebs every 6-8 hours as needed for shortness of breath and wheezing We will need to consider biologic therapies in the future 2 to 4-week follow-up with either Dr. Vaughan Browner or Wyn Quaker, FNP

## 2018-10-29 NOTE — ED Provider Notes (Signed)
-----------------------------------------   8:36 AM on 10/29/2018 -----------------------------------------   After nebs, patient is breathing comfortably.  He states that he feels much much better.  He still has persistent expiratory wheezing but normalized expiratory phase.  He is able to speak in full paragraphs without any difficulty.  No accessory muscle use, normal work of breathing.  He does report that he had recently run out of all of his inhalers except the albuterol neb solution.  Offered hospitalization but patient is feeling much better.  He also has an appointment with his pulmonologist today so plan to discharge so that he can follow-up with them, pick up his medications and continue treatment at home given that he is looking much better.  I will keep him on a longer taper of prednisone given his prolonged symptoms in the past in addition to refilling his Advair and albuterol.   Carrie Mew, MD 10/29/18 [phone]

## 2018-10-29 NOTE — Patient Instructions (Addendum)
Labwork today   Please pick up the prednisone taper from the emergency room that you are prescribed today and take this  After you finish the taper then you can start:  Prednisone 10 mg tablet >>>Use 5 to 20 mg a day based on how his symptoms are >>>take in AM with food   Dulera 200 >>> 2 puffs in the morning right when you wake up, rinse out your mouth after use, 12 hours later 2 puffs, rinse after use >>> Take this daily, no matter what >>> This is not a rescue inhaler   I have refilled her Singulair, take this daily  We have refilled your Claritin, take this daily  Use your albuterol nebulizers every 6-8 hours as needed for shortness of breath or wheezing  For the next 3 days try to take your albuterol nebulizer almost a scheduled to help with your exacerbation  Return in about 4 weeks (around 11/26/2018), or if symptoms worsen or fail to improve, for Follow up with Elisha HeadlandBrian Wyvonne Carda FNP-C, Follow up with Dr. Isaiah SergeMannam.   Coronavirus (COVID-19) Are you at risk?  Are you at risk for the Coronavirus (COVID-19)?  To be considered HIGH RISK for Coronavirus (COVID-19), you have to meet the following criteria:  . Traveled to [place], [place], [name], [place] or [place]; or in the [place] to [place], or [place]; and have fever, cough, and shortness of breath within the last 2 weeks of travel OR . Been in close contact with a person diagnosed with COVID-19 within the last 2 weeks and have fever, cough, and shortness of breath . IF YOU DO NOT MEET THESE CRITERIA, YOU ARE CONSIDERED LOW RISK FOR COVID-19.  What to do if you are HIGH RISK for COVID-19?  Marland Kitchen. If you are having a medical emergency, call 911. . Seek medical care right away. Before you go to a doctor's office, urgent care or emergency department, call ahead and tell them about your recent travel, contact with someone diagnosed with COVID-19, and your symptoms. You should receive instructions from your  physician's office regarding next steps of care.  . When you arrive at healthcare provider, tell the healthcare staff immediately you have returned from visiting [place], [place], [place], [place] or [name]; or traveled in the [place] to [place], [place], [place], or [place]; in the last two weeks or you have been in close contact with a person diagnosed with COVID-19 in the last 2 weeks.   . Tell the health care staff about your symptoms: fever, cough and shortness of breath. . After you have been seen by a medical provider, you will be either: o Tested for (COVID-19) and discharged home on quarantine except to seek medical care if symptoms worsen, and asked to  - Stay home and avoid contact with others until you get your results (4-5 days)  - Avoid travel on public transportation if possible (such as bus, train, or airplane) or o Sent to the Emergency Department by EMS for evaluation, COVID-19 testing, and possible admission depending on your condition and test results.  What to do if you are LOW RISK for COVID-19?  Reduce your risk of any infection by using the same precautions used for avoiding the common cold or flu:  Marland Kitchen. Wash your hands often with soap and warm water for at least 20 seconds.  If soap and water are not readily available, use an alcohol-based hand sanitizer with at least 60% alcohol.  .Marland Kitchen  If coughing or sneezing, cover your mouth and nose by coughing or sneezing into the elbow areas of your shirt or coat, into a tissue or into your sleeve (not your hands). . Avoid shaking hands with others and consider head nods or verbal greetings only. . Avoid touching your eyes, nose, or mouth with unwashed hands.  . Avoid close contact with people who are sick. . Avoid places or events with large numbers of people in one location, like concerts or sporting events. . Carefully consider travel plans you have or are making. . If you are planning any travel outside or inside the [name],  visit the CDC's Travelers' Health webpage for the latest health notices. . If you have some symptoms but not all symptoms, continue to monitor at home and seek medical attention if your symptoms worsen. . If you are having a medical emergency, call 911.   [place] / e-Visit: [email or web address]         [facility]: [facility]: [phone]                   [facility]: [phone]           It is flu season:   >>> Best ways to protect herself from the flu: Receive the yearly flu vaccine, practice good hand hygiene washing with soap and also using hand sanitizer when available, eat a nutritious meals, get adequate rest, hydrate appropriately   Please contact the office if your symptoms worsen or you have concerns that you are not improving.   Thank you for choosing [facility] for your healthcare, and for allowing [name] to partner with you on your healthcare journey. I am thankful to be able to provide care to you today.   Wyn Quaker FNP-C      Asthma Attack  Acute bronchospasm caused by asthma is also referred to as an asthma attack. Bronchospasm means that the air passages become narrowed or "tight," which limits the amount of oxygen that can get into the lungs. The narrowing is caused by inflammation and tightening of the muscles in the air tubes (bronchi) in the lungs. Excessive mucus is also produced, which narrows the airways more. This can cause trouble breathing, coughing, and loud breathing (wheezing). What are the causes? Possible triggers include:  Animal dander from the skin, hair, or feathers of animals.  Dust mites contained in house dust.  Cockroaches.  Pollen from trees or grass.  Mold.  Cigarette or tobacco smoke.  Air pollutants such as dust, household cleaners, hair sprays, aerosol  sprays, paint fumes, strong chemicals, or strong odors.  Cold air or weather changes. Cold air may trigger inflammation. Winds increase molds and pollens in the air.  Strong emotions such as crying or laughing hard.  Stress.  Certain medicines, such as aspirin or beta-blockers.  Sulfites in foods and drinks, such as dried fruits and wine.  Infections or inflammatory conditions, such as a flu, a cold, pneumonia, or inflammation of the nasal membranes (rhinitis).  Gastroesophageal reflux disease (GERD). GERD is a condition in which stomach acid backs up into your esophagus, which can irritate nearby airway structures.  Exercise or activity that requires a lot of energy. What are the signs or symptoms? Symptoms of this condition include:  Wheezing. This may sound like whistling while breathing. This may be more noticeable at night.  Excessive coughing, particularly at night.  Chest tightness or pain.  Shortness of breath.  Feeling like you cannot get enough air no matter how hard you try (air hunger). How is this diagnosed? This condition may be diagnosed based on:  Your medical history.  Your symptoms.  A physical exam.  Tests to check for other causes of your symptoms or other conditions that may have triggered your asthma attack. These tests may include: ? Chest X-ray. ? Blood tests. ? Specialized tests to assess lung function, such as breathing into a device that measures how much air you inhale and exhale (spirometry). How is this treated? The goal of treatment is to open the airways in your lungs and reduce inflammation. Most asthma attacks are treated with medicines that you inhale through a hand-held inhaler (metered dose inhaler, MDI) or a device that turns liquid medicine into a mist that you inhale (nebulizer). Medicines may include:  Quick relief or rescue medicines that relax the muscles of the bronchi. These medicines include bronchodilators, such as  albuterol.  Controller medicines, such as inhaled corticosteroids. These are long-acting medicines that are used for daily asthma maintenance. If you have a moderate or severe asthma attack, you may be treated with steroid medicines by mouth or through an IV injection at the hospital. Steroid medicines reduce inflammation in your lungs. Depending on the severity of your attack, you may need oxygen therapy to help you breathe. If your asthma attack was caused by a bacterial infection, such as pneumonia, you will be given antibiotic medicines. Follow these instructions at home: Medicines  Take over-the-counter and prescription medicines only as told by your health care provider. Keep your medicines up-to-date and available.  If you are more than [redacted] weeks pregnant and you are prescribed any new medicines, tell your obstetrician about those medicines.  If you were prescribed an antibiotic medicine, take it as told by your health care provider. Do not stop taking the antibiotic even if you start to feel better. Avoiding triggers   Keep track of things that trigger your asthma attacks or cause you to have breathing problems, and avoid exposure to these triggers.  Do not use any products that contain nicotine or tobacco, such as cigarettes and e-cigarettes. If you need help quitting, ask your health care provider.  Avoid secondhand smoke.  Avoid strong smells, such as perfumes, aerosols, and cleaning solvents.  When pollen or air pollution is bad, keep windows closed and use an air conditioner or go to places with air conditioning. Asthma action plan  Work with your health care provider to make a written plan for managing and treating your asthma attacks (asthma action plan). This plan should include: ? A list of your asthma triggers and how to avoid them. ? Information about when your medicines should be taken and when their dosage should be changed. ? Instructions about using a device called  a peak flow meter to monitor your condition. A peak flow meter measures how well your lungs are working and measures how severe your asthma is at a given time. Your "personal best" is the highest peak flow rate you can reach when you feel good and have no asthma symptoms. General instructions  Avoid excessive exercise or activity until your asthma attack resolves. Ask your health care provider what activities are safe for you and when you can return to your normal activities.  Stay up to date on all vaccinations recommended by your health care provider, such as flu and pneumonia vaccines.  Drink enough  fluid to keep your urine clear or pale yellow. Staying hydrated helps keep mucus in your lungs thin so it can be coughed up easily.  If you drink caffeine, do so in moderation.  Do not use alcohol until you have recovered.  Keep all follow-up visits as told by your health care provider. This is important. Asthma requires careful medical care, and you and your health care provider can work together to reduce the likelihood of future attacks. Contact a health care provider if:  Your peak flow reading is still at 50-79% of your personal best after you have followed your action plan for 1 hour. This is in the yellow zone, which means "caution."  You need to use a reliever medicine more than 2-3 times a week.  Your medicines are causing side effects, such as: ? Rash. ? Itching. ? Swelling. ? Trouble breathing.  Your symptoms do not improve after 48 hours.  You cough up mucus (sputum) that is thicker than usual.  You have a fever.  You need to use your medicines much more frequently than normal. Get help right away if:  Your peak flow reading is less than 50% of your personal best. This is in the red zone, which means "danger."  You have severe trouble breathing.  You develop chest pain or discomfort.  Your medicines no longer seem to be helping.  You vomit.  You cannot eat or  drink without vomiting.  You are coughing up yellow, green, brown, or bloody mucus.  You have a fever and your symptoms suddenly get worse.  You have trouble swallowing.  You feel very tired, and breathing becomes tiring. Summary  Acute bronchospasm caused by asthma is also referred to as an asthma attack.  Bronchospasm is caused by narrowing or tightness in air passages, which causes shortness of breath, coughing, and loud breathing (wheezing).  Many things can trigger an asthma attack, such as allergens, weather changes, exercise, smoke, and other fumes.  Treatment for an asthma attack may include inhaled rescue medicines for immediate relief, as well as the use of maintenance therapy.  Get help right away if you have worsening shortness of breath, chest pain, or fever, or if your home medicines are no longer helping with your symptoms. This information is not intended to replace advice given to you by your health care provider. Make sure you discuss any questions you have with your health care provider. Document Released: 08/21/2006 Document Revised: 06/07/2016 Document Reviewed: 06/07/2016 Elsevier Interactive Patient Education  2019 [employer]

## 2018-10-29 NOTE — Discharge Instructions (Signed)
1.  Take prednisone 60 mg daily x4 days.  Start your next dose tomorrow. 2.  Use your albuterol nebulizer and inhaler every 4 hours as needed for wheezing/difficulty breathing. 3.  Return to the ER for worsening symptoms, persistent vomiting, difficulty breathing or other concerns.

## 2018-10-29 NOTE — Assessment & Plan Note (Signed)
Plan: ED's prednisone taper prescribed today, please complete Restart Dulera 200 Prednisone sent in to be taken after completing emergency room's prednisone taper Close follow-up with our office

## 2018-10-30 LAB — CBC WITH DIFFERENTIAL/PLATELET
Basophils Absolute: 0.1 10*3/uL (ref 0.0–0.1)
Basophils Relative: 0.8 % (ref 0.0–3.0)
Eosinophils Absolute: 0.1 10*3/uL (ref 0.0–0.7)
Eosinophils Relative: 1.9 % (ref 0.0–5.0)
HCT: 46.8 % (ref 39.0–52.0)
Hemoglobin: 15.5 g/dL (ref 13.0–17.0)
Lymphocytes Relative: 22.8 % (ref 12.0–46.0)
Lymphs Abs: 1.6 10*3/uL (ref 0.7–4.0)
MCHC: 33.2 g/dL (ref 30.0–36.0)
MCV: 93.3 fl (ref 78.0–100.0)
Monocytes Absolute: 0.5 10*3/uL (ref 0.1–1.0)
Monocytes Relative: 7.6 % (ref 3.0–12.0)
Neutro Abs: 4.8 10*3/uL (ref 1.4–7.7)
Neutrophils Relative %: 66.9 % (ref 43.0–77.0)
Platelets: 263 10*3/uL (ref 150.0–400.0)
RBC: 5.02 Mil/uL (ref 4.22–5.81)
RDW: 13.8 % (ref 11.5–15.5)
WBC: 7.1 10*3/uL (ref 4.0–10.5)

## 2018-10-30 LAB — IGE: IgE (Immunoglobulin E), Serum: 1550 kU/L — ABNORMAL HIGH (ref <=114)

## 2018-10-30 NOTE — Progress Notes (Signed)
Blood work is still elevated.  CBC was little bit more normal but this is likely due to the steroids they gave you in the emergency room.  Keep same plan of care.  I am also not seeing that you are currently following up with a primary care provider.  If you do not have a primary care provider we can put in a order for primary care provider today, if you would like.  Wyn Quaker, FNP

## 2018-11-04 ENCOUNTER — Telehealth: Payer: Self-pay

## 2018-11-04 DIAGNOSIS — J4550 Severe persistent asthma, uncomplicated: Secondary | ICD-10-CM

## 2018-11-04 NOTE — Telephone Encounter (Signed)
Notes recorded by [name], NP on 10/30/2018 at 2:53 PM EDT  Blood work is still elevated. CBC was little bit more normal but this is likely due to the steroids they gave you in the emergency room.   Keep same plan of care.   I am also not seeing that you are currently following up with a primary care provider. If you do not have a primary care provider we can put in a order for primary care provider today, if you would like.   Wyn Quaker, FNP  Called and spoke with pt letting him know the results of the labwork and pt verbalized understanding. Asked pt if he had a current PCP and pt stated he did not. Stated to pt that we would refer him for PCP and pt verbalized understanding. Nothing further needed.

## 2018-11-18 ENCOUNTER — Telehealth: Payer: Self-pay | Admitting: Pharmacy Technician

## 2018-11-18 NOTE — Telephone Encounter (Signed)
Patient failed to provide requested financial documentation.  Financial documentation is required in order to determine patient's eligibility for [facility]'s program.  No additional medication assistance will be provided until patient provides requested financial documentation.  Patient notified by letter.  Shannon Knapp Pharmacy Technician/Eligibility Specialist [facility] 

## 2018-11-25 NOTE — Progress Notes (Deleted)
 @Patient  ID: Shannon Knapp, male    DOB: 07/05/1985, 33 y.o.   MRN: [id number]  No chief complaint on file.   Referring provider: No ref. provider found  HPI:  33 year old former smoker followed in our office for severe persistant eosinophillic asthma   PMH: Hypertension Smoker/ Smoking History: Former Smoker.  Quit 2019.  1/2 pack year smoking history. Maintenance:  Dulera 200, standing prednisone  Pt of: Dr. Isaiah SergeMannam  11/25/2018  - Visit   HPI  Tests:   Data Reviewed: Imaging CT chest 10/22/2017- bronchial wall thickening, dependent atelectasis in the lower lobe. CT abdomen pelvis 10/27/2017- no acute intra-abdominal process. Chest x-ray 11/28/2017-hyperinflated lungs, no acute abnormality   PFTs 10/30/2017 FVC 5.81 [120%], FEV1 2.38 [61%], F/F 41, TLC 166%, RV/TLC 221%, DLCO 104%, Poor patient effort,  Patient did not want albuterol Moderate obstructive airway disease with overinflation, trapping.  Normal diffusion defect.  FENO  10/17/2017-95 12/02/2017-54  ACQ6 03/16/18- 3  Labs CBC 11/28/2017-WBC 10.1, eos 14%, absolute eosinophil count 1400 IgE 10/22/2017-2209  09/16/2018-SARS-CoV-2-negative  10/29/2018-SARS-CoV-2-negative  FENO:  Lab Results  Component Value Date   NITRICOXIDE 95 10/17/2017    PFT: PFT Results Latest Ref Rng & Units 10/29/2017  FVC-Pre L 5.81  FVC-Predicted Pre % 124  Pre FEV1/FVC % % 41  FEV1-Pre L 2.38  FEV1-Predicted Pre % 61  DLCO UNC% % 104  DLCO COR %Predicted % 97  TLC L 11.87  TLC % Predicted % 166  RV % Predicted % 360    Imaging: Dg Chest Port 1 View  Result Date: 10/29/2018 CLINICAL DATA:  Wheezing. History of asthma. No relief from inhaler. EXAM: PORTABLE CHEST 1 VIEW COMPARISON:  One-view chest x-ray FINDINGS: The heart size is normal. Mild hilar prominence is chronic. Ill-defined left lower lobe airspace disease is noted. No significant consolidation is present. IMPRESSION: 1. Ill-defined left lower lobe airspace  disease. While this likely reflects atelectasis associated with patient's asthma. Infection is not excluded. Electronically Signed   By: Marin Robertshristopher  Mattern M.D.   On: 10/29/2018 06:39      Specialty Problems      Pulmonary Problems   Respiratory distress   Severe persistent acute asthmatic bronchitis   Status asthmaticus   Acute respiratory distress   Acute respiratory failure with hypoxemia (HCC)   Asthma exacerbation   Cough   Hemoptysis   Allergic rhinitis   Upper airway cough syndrome   Severe persistent asthma    PFTs 10/30/2017 FVC 5.81 [120%], FEV1 2.38 [61%], F/F 41, TLC 166%, RV/TLC 221%, DLCO 104%, Poor patient effort,  Patient did not want albuterol Moderate obstructive airway disease with overinflation, trapping.  Normal diffusion defect.  FENO  10/17/2017-95 12/02/2017-54  ACQ6 03/16/18- 3  Labs CBC 11/28/2017-WBC 10.1, eos 14%, absolute eosinophil count 1400 IgE 10/22/2017-2209         Allergies  Allergen Reactions  . Amoxicillin Hives    Has patient had a PCN reaction causing immediate rash, facial/tongue/throat swelling, SOB or lightheadedness with hypotension: Yes Has patient had a PCN reaction causing severe rash involving mucus membranes or skin necrosis: No Has patient had a PCN reaction that required hospitalization: No Has patient had a PCN reaction occurring within the last 10 years: Yes If all of the above answers are "NO", then may proceed with Cephalosporin use.  . Fish Allergy Swelling  . Shellfish Allergy Swelling  . Penicillins Hives    Has patient had a PCN reaction causing immediate rash, facial/tongue/throat swelling,  SOB or lightheadedness with hypotension: Yes Has patient had a PCN reaction causing severe rash involving mucus membranes or skin necrosis: No Has patient had a PCN reaction that required hospitalization: No Has patient had a PCN reaction occurring within the last 10 years: Yes If all of the above answers are "NO", then  may proceed with Cephalosporin use.    Immunization History  Administered Date(s) Administered  . Pneumococcal Polysaccharide-23 10/19/2017    Past Medical History:  Diagnosis Date  . Asthma   . Chronic bronchitis (HCC)   . COPD (chronic obstructive pulmonary disease) (HCC)     Tobacco History: Social History   Tobacco Use  Smoking Status Current Every Day Smoker  . Packs/day: 0.25  . Years: 6.00  . Pack years: 1.50  . Types: Cigarettes  . Last attempt to quit: 09/02/2017  . Years since quitting: 1.2  Smokeless Tobacco Never Used  Tobacco Comment   4-5 cigarettes daily   Ready to quit: Not Answered Counseling given: Not Answered Comment: 4-5 cigarettes daily   Continue to not smoke  Outpatient Encounter Medications as of 11/26/2018  Medication Sig  . albuterol (PROVENTIL) (2.5 MG/3ML) 0.083% nebulizer solution Take 3 mLs (2.5 mg total) by nebulization every 4 (four) hours as needed for up to 30 days for wheezing or shortness of breath. Use twice daily and as needed for coughing  . benzonatate (TESSALON) 100 MG capsule Take 1 capsule (100 mg total) by mouth 3 (three) times daily as needed for cough. (Patient not taking: Reported on 10/29/2018)  . cyclobenzaprine (FLEXERIL) 10 MG tablet Take 10 mg by mouth 3 (three) times daily.  . Fluticasone-Salmeterol (ADVAIR DISKUS) 100-50 MCG/DOSE AEPB Inhale 1 puff into the lungs 2 (two) times daily for 30 days. (Patient not taking: Reported on 10/29/2018)  . guaiFENesin (MUCINEX) 600 MG 12 hr tablet Take 1,200 mg by mouth daily.  Marland Kitchen. guaiFENesin-codeine 100-10 MG/5ML syrup Take 5 mLs by mouth every 6 (six) hours as needed for cough. (Patient not taking: Reported on 10/29/2018)  . loratadine (CLARITIN) 10 MG tablet Take 1 tablet (10 mg total) by mouth daily.  . mometasone-formoterol (DULERA) 200-5 MCG/ACT AERO Inhale 2 puffs into the lungs 2 (two) times daily.  . mometasone-formoterol (DULERA) 200-5 MCG/ACT AERO Inhale 2 puffs into the  lungs 2 (two) times a day.  . montelukast (SINGULAIR) 10 MG tablet Take 1 tablet (10 mg total) by mouth at bedtime.  Marland Kitchen. oxyCODONE-acetaminophen (PERCOCET) 5-325 MG tablet Take 1 tablet by mouth every 4 (four) hours as needed for severe pain. (Patient not taking: Reported on 10/29/2018)  . predniSONE (DELTASONE) 10 MG tablet Can take 0.5 (5 mg total) to 2 tablets (20mg ) daily as needed based on symptoms  . traMADol (ULTRAM) 50 MG tablet Take 50 mg by mouth every 4 (four) hours as needed.   No facility-administered encounter medications on file as of 11/26/2018.      Review of Systems  Review of Systems   Physical Exam  There were no vitals taken for this visit.  Wt Readings from Last 5 Encounters:  10/29/18 162 lb 12.8 oz (73.8 kg)  10/09/18 165 lb (74.8 kg)  10/09/18 165 lb (74.8 kg)  09/17/18 165 lb (74.8 kg)  09/16/18 171 lb (77.6 kg)     Physical Exam    Lab Results:  CBC    Component Value Date/Time   WBC 7.1 10/29/2018 1646   RBC 5.02 10/29/2018 1646   HGB 15.5 10/29/2018 1646   HCT  46.8 10/29/2018 1646   PLT 263.0 10/29/2018 1646   MCV 93.3 10/29/2018 1646   MCH 30.8 10/29/2018 0540   MCHC 33.2 10/29/2018 1646   RDW 13.8 10/29/2018 1646   LYMPHSABS 1.6 10/29/2018 1646   MONOABS 0.5 10/29/2018 1646   EOSABS 0.1 10/29/2018 1646   BASOSABS 0.1 10/29/2018 1646    BMET    Component Value Date/Time   NA 139 10/29/2018 0540   K 3.9 10/29/2018 0540   CL 104 10/29/2018 0540   CO2 26 10/29/2018 0540   GLUCOSE 92 10/29/2018 0540   BUN 11 10/29/2018 0540   CREATININE 0.83 10/29/2018 0540   CALCIUM 9.2 10/29/2018 0540   GFRNONAA >60 10/29/2018 0540   GFRAA >60 10/29/2018 0540    BNP No results found for: BNP  ProBNP No results found for: PROBNP    Assessment & Plan:   No problem-specific Assessment & Plan notes found for this encounter.    No follow-ups on file.   [name], NP 11/25/2018   This appointment was *** minutes long with over 50%  of the time in direct face-to-face patient care, assessment, plan of care, and follow-up.

## 2018-11-26 ENCOUNTER — Ambulatory Visit (INDEPENDENT_AMBULATORY_CARE_PROVIDER_SITE_OTHER): Payer: Self-pay | Admitting: Pulmonary Disease

## 2018-11-26 ENCOUNTER — Telehealth: Payer: Self-pay | Admitting: Pulmonary Disease

## 2018-11-26 ENCOUNTER — Other Ambulatory Visit: Payer: Self-pay

## 2018-11-26 ENCOUNTER — Ambulatory Visit: Payer: Self-pay | Admitting: Pulmonary Disease

## 2018-11-26 ENCOUNTER — Encounter: Payer: Self-pay | Admitting: Pulmonary Disease

## 2018-11-26 VITALS — BP 110/72 | HR 93 | Temp 98.4°F | Ht 71.0 in | Wt 169.0 lb

## 2018-11-26 DIAGNOSIS — Z0000 Encounter for general adult medical examination without abnormal findings: Secondary | ICD-10-CM

## 2018-11-26 DIAGNOSIS — J4550 Severe persistent asthma, uncomplicated: Secondary | ICD-10-CM

## 2018-11-26 MED ORDER — BENZONATATE 100 MG PO CAPS: 100.0000 mg | ORAL_CAPSULE | Freq: Three times a day (TID) | ORAL | 3 refills | Status: DC | PRN

## 2018-11-26 MED ORDER — BUDESONIDE-FORMOTEROL FUMARATE 160-4.5 MCG/ACT IN AERO: 2.0000 | INHALATION_SPRAY | Freq: Two times a day (BID) | RESPIRATORY_TRACT | 0 refills | Status: DC

## 2018-11-26 NOTE — Assessment & Plan Note (Signed)
Plan: Patient needs primary care provider, patient was referred but they were unable to reach Patient needs to contact primary care provider at: [phone] >>> Would recommend the patient establish with Dr. Joya Gaskins or Dr. Lenna Gilford due to his severe persistent eosinophilic asthma

## 2018-11-26 NOTE — Assessment & Plan Note (Addendum)
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 as well as significantly elevated IgE in 2019 lab work 2020 lab work also shows elevated IgE 2019 Feno is elevated between 95 and 54 Patient has had 3 emergency room visits for asthma exacerbations over the last 5 months Patient has been managed on Dulera 200 for the last month and doing well Patient has had to use albuterol nebulized medication once weekly Patient working to get insurance coverage through [employer], he has to accomplish particular number of hours goal would be to reach these hours by end of this month or beginning of August/2020 Breath sounds clear on auscultation today  Plan: No Dulera 200 samples in office today, will switch to Symbicort 160, samples provided today Continue daily prednisone with a range of 5 mg to 20 mg based off of patient's symptoms Continue daily Singulair Continue daily Claritin Continue albuterol nebs as needed Follow-up in 6 weeks

## 2018-11-26 NOTE — Progress Notes (Signed)
 @Patient  ID: Shannon Knapp, male    DOB: 11/29/1985, 33 y.o.   MRN: [id number]  Chief Complaint  Patient presents with  . Follow-up    Asthma follow-up    Referring provider: No ref. provider found  HPI:  33 year old former smoker followed in our office for severe persistant eosinophillic asthma   PMH: Hypertension Smoker/ Smoking History: Former Smoker.  Quit 2019.  1/2 pack year smoking history. Maintenance:  Dulera 200, standing prednisone  Pt of: Dr. Isaiah SergeMannam  11/26/2018  - Visit   33 year old male former smoker followed in our office for severe persistent eosinophilic asthma.  Patient completing a 4-week follow-up with our office today.  Patient reports that his breathing has been quite stable over the last 4 weeks since last being seen.  Patient was started back on Dulera 200 as well as standing prednisone.  Patient has a stated prednisone range of 5 mg to 20 mg a day based off of his symptoms.  Patient reports that he is currently maintained on 10 mg daily.  He has had to use his albuterol nebulized medications once weekly for the last 4 weeks.  This is a significant improvement for the patient as he has had recurrent emergency room visits due to shortness of breath and asthma exacerbations.  Patient is still working full-time at [employer] and hoping to eventually be given healthcare benefits.  He should be reaching his hourly quoted that is needed in order to achieve this sometime later this month or beginning of next.  Currently right now the patient has no healthcare coverage.  He is also maintained on daily Claritin, Singulair.   Tests:   Data Reviewed: Imaging CT chest 10/22/2017- bronchial wall thickening, dependent atelectasis in the lower lobe. CT abdomen pelvis 10/27/2017- no acute intra-abdominal process. Chest x-ray 11/28/2017-hyperinflated lungs, no acute abnormality   PFTs 10/30/2017 FVC 5.81 [120%], FEV1 2.38 [61%], F/F 41, TLC 166%, RV/TLC 221%, DLCO 104%, Poor  patient effort,  Patient did not want albuterol Moderate obstructive airway disease with overinflation, trapping.  Normal diffusion defect.  FENO  10/17/2017-95 12/02/2017-54  ACQ6 03/16/18- 3  Labs CBC 11/28/2017-WBC 10.1, eos 14%, absolute eosinophil count 1400 IgE 10/22/2017-2209  09/16/2018-SARS-CoV-2-negative  10/29/2018-SARS-CoV-2-negative  10/29/2018-IgE-1550 10/29/2018-CBC with differential-eosinophils relative 1.9, eosinophils absolute 0.1 (patient receiving steroids during this blood work)  FENO:  Lab Results  Component Value Date   NITRICOXIDE 95 10/17/2017    PFT: PFT Results Latest Ref Rng & Units 10/29/2017  FVC-Pre L 5.81  FVC-Predicted Pre % 124  Pre FEV1/FVC % % 41  FEV1-Pre L 2.38  FEV1-Predicted Pre % 61  DLCO UNC% % 104  DLCO COR %Predicted % 97  TLC L 11.87  TLC % Predicted % 166  RV % Predicted % 360    Imaging: Dg Chest Port 1 View  Result Date: 10/29/2018 CLINICAL DATA:  Wheezing. History of asthma. No relief from inhaler. EXAM: PORTABLE CHEST 1 VIEW COMPARISON:  One-view chest x-ray FINDINGS: The heart size is normal. Mild hilar prominence is chronic. Ill-defined left lower lobe airspace disease is noted. No significant consolidation is present. IMPRESSION: 1. Ill-defined left lower lobe airspace disease. While this likely reflects atelectasis associated with patient's asthma. Infection is not excluded. Electronically Signed   By: Marin Robertshristopher  Mattern M.D.   On: 10/29/2018 06:39      Specialty Problems      Pulmonary Problems   Respiratory distress   Severe persistent acute asthmatic bronchitis   Status asthmaticus  Acute respiratory distress   Acute respiratory failure with hypoxemia (HCC)   Asthma exacerbation   Cough   Hemoptysis   Allergic rhinitis   Upper airway cough syndrome   Severe persistent asthma    PFTs 10/30/2017 FVC 5.81 [120%], FEV1 2.38 [61%], F/F 41, TLC 166%, RV/TLC 221%, DLCO 104%, Poor patient effort,  Patient did not  want albuterol Moderate obstructive airway disease with overinflation, trapping.  Normal diffusion defect.  FENO  10/17/2017-95 12/02/2017-54  ACQ6 03/16/18- 3  Labs CBC 11/28/2017-WBC 10.1, eos 14%, absolute eosinophil count 1400 IgE 10/22/2017-2209 10/29/2018-IgE-1550         Allergies  Allergen Reactions  . Amoxicillin Hives    Has patient had a PCN reaction causing immediate rash, facial/tongue/throat swelling, SOB or lightheadedness with hypotension: Yes Has patient had a PCN reaction causing severe rash involving mucus membranes or skin necrosis: No Has patient had a PCN reaction that required hospitalization: No Has patient had a PCN reaction occurring within the last 10 years: Yes If all of the above answers are "NO", then may proceed with Cephalosporin use.  . Fish Allergy Swelling  . Shellfish Allergy Swelling  . Penicillins Hives    Has patient had a PCN reaction causing immediate rash, facial/tongue/throat swelling, SOB or lightheadedness with hypotension: Yes Has patient had a PCN reaction causing severe rash involving mucus membranes or skin necrosis: No Has patient had a PCN reaction that required hospitalization: No Has patient had a PCN reaction occurring within the last 10 years: Yes If all of the above answers are "NO", then may proceed with Cephalosporin use.    Immunization History  Administered Date(s) Administered  . Pneumococcal Polysaccharide-23 10/19/2017    Past Medical History:  Diagnosis Date  . Asthma   . Chronic bronchitis ([place])   . COPD (chronic obstructive pulmonary disease) (HCC)     Tobacco History: Social History   Tobacco Use  Smoking Status Former Smoker  . Packs/day: 0.25  . Years: 14.00  . Pack years: 3.50  . Types: Cigarettes  . Start date: 2006  . Quit date: 08/2018  . Years since quitting: 0.2  Smokeless Tobacco Never Used  Tobacco Comment   4-5 cigarettes daily   Counseling given: Not Answered Comment: 4-5  cigarettes daily   Continue to not smoke  Outpatient Encounter Medications as of 11/26/2018  Medication Sig  . albuterol (PROVENTIL) (2.5 MG/3ML) 0.083% nebulizer solution Take 3 mLs (2.5 mg total) by nebulization every 4 (four) hours as needed for up to 30 days for wheezing or shortness of breath. Use twice daily and as needed for coughing  . benzonatate (TESSALON) 100 MG capsule Take 1 capsule (100 mg total) by mouth 3 (three) times daily as needed for cough.  Marland Kitchen guaiFENesin (MUCINEX) 600 MG 12 hr tablet Take 1,200 mg by mouth daily.  . mometasone-formoterol (DULERA) 200-5 MCG/ACT AERO Inhale 2 puffs into the lungs 2 (two) times daily.  . mometasone-formoterol (DULERA) 200-5 MCG/ACT AERO Inhale 2 puffs into the lungs 2 (two) times a day.  . montelukast (SINGULAIR) 10 MG tablet Take 1 tablet (10 mg total) by mouth at bedtime.  . predniSONE (DELTASONE) 10 MG tablet Can take 0.5 (5 mg total) to 2 tablets (20mg ) daily as needed based on symptoms  . [DISCONTINUED] benzonatate (TESSALON) 100 MG capsule Take 1 capsule (100 mg total) by mouth 3 (three) times daily as needed for cough.  . budesonide-formoterol (SYMBICORT) 160-4.5 MCG/ACT inhaler Inhale 2 puffs into the lungs 2 (two)  times a day for 1 day.  . cyclobenzaprine (FLEXERIL) 10 MG tablet Take 10 mg by mouth 3 (three) times daily.  . Fluticasone-Salmeterol (ADVAIR DISKUS) 100-50 MCG/DOSE AEPB Inhale 1 puff into the lungs 2 (two) times daily for 30 days. (Patient not taking: Reported on 10/29/2018)  . guaiFENesin-codeine 100-10 MG/5ML syrup Take 5 mLs by mouth every 6 (six) hours as needed for cough. (Patient not taking: Reported on 10/29/2018)  . loratadine (CLARITIN) 10 MG tablet Take 1 tablet (10 mg total) by mouth daily. (Patient not taking: Reported on 11/26/2018)  . oxyCODONE-acetaminophen (PERCOCET) 5-325 MG tablet Take 1 tablet by mouth every 4 (four) hours as needed for severe pain. (Patient not taking: Reported on 10/29/2018)  .  [DISCONTINUED] traMADol (ULTRAM) 50 MG tablet Take 50 mg by mouth every 4 (four) hours as needed.   No facility-administered encounter medications on file as of 11/26/2018.      Review of Systems  Review of Systems  Constitutional: Negative for activity change, chills, fatigue, fever and unexpected weight change.  HENT: Negative for congestion, postnasal drip, rhinorrhea, sinus pressure, sinus pain and sore throat.   Eyes: Negative.   Respiratory: Positive for wheezing (Occasional significantly improved, sometimes happening at night once weekly). Negative for cough and shortness of breath.   Cardiovascular: Negative for chest pain and palpitations.  Gastrointestinal: Negative for constipation, diarrhea, nausea and vomiting.  Endocrine: Negative.   Genitourinary: Negative.   Musculoskeletal: Negative.   Skin: Negative.   Neurological: Negative for dizziness and headaches.  Psychiatric/Behavioral: Negative.  Negative for dysphoric mood. The patient is not nervous/anxious.   All other systems reviewed and are negative.    Physical Exam  BP 110/72 (BP Location: Right Arm, Patient Position: Sitting, Cuff Size: Normal)   Pulse 93   Temp 98.4 F (36.9 C) (Oral)   Ht 5\' 11"  (1.803 m)   Wt 169 lb (76.7 kg)   SpO2 98%   BMI 23.57 kg/m   Wt Readings from Last 5 Encounters:  11/26/18 169 lb (76.7 kg)  10/29/18 162 lb 12.8 oz (73.8 kg)  10/09/18 165 lb (74.8 kg)  10/09/18 165 lb (74.8 kg)  09/17/18 165 lb (74.8 kg)     Physical Exam  Constitutional: He is oriented to person, place, and time and well-developed, well-nourished, and in no distress. No distress.  HENT:  Head: Normocephalic and atraumatic.  Right Ear: Hearing, tympanic membrane, external ear and ear canal normal.  Left Ear: Hearing, tympanic membrane, external ear and ear canal normal.  Nose: Nose normal.  Mouth/Throat: Uvula is midline and oropharynx is clear and moist. No oropharyngeal exudate.  Postnasal drip   Eyes: Pupils are equal, round, and reactive to light.  Neck: Normal range of motion. Neck supple.  Cardiovascular: Normal rate, regular rhythm and normal heart sounds.  Pulmonary/Chest: Effort normal and breath sounds normal. No accessory muscle usage. No respiratory distress. He has no decreased breath sounds. He has no wheezes. He has no rhonchi. He has no rales.  Abdominal: Soft. Bowel sounds are normal. There is no abdominal tenderness.  Musculoskeletal: Normal range of motion.        General: No edema.  Lymphadenopathy:    He has no cervical adenopathy.  Neurological: He is alert and oriented to person, place, and time. Gait normal.  Skin: Skin is warm and dry. He is not diaphoretic. No erythema.  Psychiatric: Mood, memory, affect and judgment normal.  Nursing note and vitals reviewed.     Lab Results:  CBC    Component Value Date/Time   WBC 7.1 10/29/2018 1646   RBC 5.02 10/29/2018 1646   HGB 15.5 10/29/2018 1646   HCT 46.8 10/29/2018 1646   PLT 263.0 10/29/2018 1646   MCV 93.3 10/29/2018 1646   MCH 30.8 10/29/2018 0540   MCHC 33.2 10/29/2018 1646   RDW 13.8 10/29/2018 1646   LYMPHSABS 1.6 10/29/2018 1646   MONOABS 0.5 10/29/2018 1646   EOSABS 0.1 10/29/2018 1646   BASOSABS 0.1 10/29/2018 1646    BMET    Component Value Date/Time   NA 139 10/29/2018 0540   K 3.9 10/29/2018 0540   CL 104 10/29/2018 0540   CO2 26 10/29/2018 0540   GLUCOSE 92 10/29/2018 0540   BUN 11 10/29/2018 0540   CREATININE 0.83 10/29/2018 0540   CALCIUM 9.2 10/29/2018 0540   GFRNONAA >60 10/29/2018 0540   GFRAA >60 10/29/2018 0540    BNP No results found for: BNP  ProBNP No results found for: PROBNP    Assessment & Plan:   Severe persistent asthma 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 as well as significantly elevated IgE in 2019 lab work 2020 lab work also shows elevated IgE 2019 Feno is elevated between 95 and 54 Patient  has had 3 emergency room visits for asthma exacerbations over the last 5 months Patient has been managed on Dulera 200 for the last month and doing well Patient has had to use albuterol nebulized medication once weekly Patient working to get insurance coverage through [employer], he has to accomplish particular number of hours goal would be to reach these hours by end of this month or beginning of August/2020 Breath sounds clear on auscultation today  Plan: No Dulera 200 samples in office today, will switch to Symbicort 160, samples provided today Continue daily prednisone with a range of 5 mg to 20 mg based off of patient's symptoms Continue daily Singulair Continue daily Claritin Continue albuterol nebs as needed Follow-up in 6 weeks  Healthcare maintenance Plan: Patient needs primary care provider, patient was referred but they were unable to reach Patient needs to contact primary care provider at: [phone] >>> Would recommend the patient establish with Dr. Delford FieldWright or Dr. Kriste BasqueNadel due to his severe persistent eosinophilic asthma    Return in about 6 weeks (around 01/07/2019), or if symptoms worsen or fail to improve, for Follow up with Elisha HeadlandBrian Felesha Moncrieffe FNP-C.   Coral CeoBrian P Aarin Sparkman, NP 11/26/2018   This appointment was 26 minutes long with over 50% of the time in direct face-to-face patient care, assessment, plan of care, and follow-up.

## 2018-11-26 NOTE — Addendum Note (Signed)
Addended by: Spero Curb on: 11/26/2018 05:07 PM   Modules accepted: Orders

## 2018-11-26 NOTE — Patient Instructions (Addendum)
Prednisone 10 mg tablet >>>Use 5 to 20 mg a day based on how his symptoms are >>>take in AM with food   Switch to Symbicort 160 >>> 2 puffs in the morning right when you wake up, rinse out your mouth after use, 12 hours later 2 puffs, rinse after use >>> Take this daily, no matter what >>> This is not a rescue inhaler   Stop Dulera 200   Continue Singulair, take this daily  Contiue Claritin, take this daily  Use your albuterol nebulizers every 6-8 hours as needed for shortness of breath or wheezing    Return in about 6 weeks (around 01/07/2019), or if symptoms worsen or fail to improve, for Follow up with Wyn Quaker FNP-C.   Coronavirus (COVID-19) Are you at risk?  Are you at risk for the Coronavirus (COVID-19)?  To be considered HIGH RISK for Coronavirus (COVID-19), you have to meet the following criteria:  . Traveled to [place], [place], [place], [place] or [place]; or in the [place] to [place], [place], [place], or [place]; and have fever, cough, and shortness of breath within the last 2 weeks of travel OR . Been in close contact with a person diagnosed with COVID-19 within the last 2 weeks and have fever, cough, and shortness of breath . IF YOU DO NOT MEET THESE CRITERIA, YOU ARE CONSIDERED LOW RISK FOR COVID-19.  What to do if you are HIGH RISK for COVID-19?  Marland Kitchen If you are having a medical emergency, call 911. . Seek medical care right away. Before you go to a doctor's office, urgent care or emergency department, call ahead and tell them about your recent travel, contact with someone diagnosed with COVID-19, and your symptoms. You should receive instructions from your physician's office regarding next steps of care.  . When you arrive at healthcare provider, tell the healthcare staff immediately you have returned from visiting [place], [place], [place], [place] or [place]; or traveled in the [place] to [place], [place], [place], or [place]; in the  last two weeks or you have been in close contact with a person diagnosed with COVID-19 in the last 2 weeks.   . Tell the health care staff about your symptoms: fever, cough and shortness of breath. . After you have been seen by a medical provider, you will be either: o Tested for (COVID-19) and discharged home on quarantine except to seek medical care if symptoms worsen, and asked to  - Stay home and avoid contact with others until you get your results (4-5 days)  - Avoid travel on public transportation if possible (such as bus, train, or airplane) or o Sent to the Emergency Department by EMS for evaluation, COVID-19 testing, and possible admission depending on your condition and test results.  What to do if you are LOW RISK for COVID-19?  Reduce your risk of any infection by using the same precautions used for avoiding the common cold or flu:  Marland Kitchen Wash your hands often with soap and warm water for at least 20 seconds.  If soap and water are not readily available, use an alcohol-based hand sanitizer with at least 60% alcohol.  . If coughing or sneezing, cover your mouth and nose by coughing or sneezing into the elbow areas of your shirt or coat, into a tissue or into your sleeve (not your hands). . Avoid shaking hands with others and consider head nods or verbal greetings only. . Avoid touching your eyes, nose, or  mouth with unwashed hands.  . Avoid close contact with people who are sick. . Avoid places or events with large numbers of people in one location, like concerts or sporting events. . Carefully consider travel plans you have or are making. . If you are planning any travel outside or inside the [place], visit the CDC's Travelers' Health webpage for the latest health notices. . If you have some symptoms but not all symptoms, continue to monitor at home and seek medical attention if your symptoms worsen. . If you are having a medical emergency, call 911.   ADDITIONAL HEALTHCARE OPTIONS FOR  PATIENTS  [facility] / e-Visit: [email or web address]         [facility]: [phone]  [facility]: [phone]                   [facility]: [phone]           It is flu season:   >>> Best ways to protect herself from the flu: Receive the yearly flu vaccine, practice good hand hygiene washing with soap and also using hand sanitizer when available, eat a nutritious meals, get adequate rest, hydrate appropriately   Please contact the office if your symptoms worsen or you have concerns that you are not improving.   Thank you for choosing [facility] for your healthcare, and for allowing us to partner with you on your healthcare journey. I am thankful to be able to provide care to you today.   Elisha HeadlandBrian Ifeoluwa Beller FNP-C

## 2018-11-26 NOTE — Telephone Encounter (Signed)
11/26/2018 1701  Jess, Can you please contact the patient and remind him that we have referred him [facility]  They have attempted to reach the patient but were unsuccessful.  Patient needs to contact them at: [phone].  I would recommend that he establishes with Dr. Joya Gaskins or Dr. Lenna Gilford due to his severe persistent eosinophilic asthma.  Wyn Quaker FNP

## 2018-11-27 NOTE — Telephone Encounter (Signed)
LMOM TCB x1 for patient Will need to provide phone number for [place] NP mentioned that the [place] Rx from yesterday printed accidentally and I need to verify what pharmacy he would like the Rx sent to.

## 2018-11-30 NOTE — Telephone Encounter (Signed)
LMTCB

## 2018-12-03 NOTE — Telephone Encounter (Signed)
LMTCB X3

## 2018-12-14 NOTE — Telephone Encounter (Signed)
12/14/2018 1004  Triage,  Can we please send the patient a letter notifying that we have been trying to reach him.   After that this encounter can be signed is nothing further is needed we can reevaluate at August/2020 office visit in person.  Wyn Quaker, FNP

## 2018-12-14 NOTE — Telephone Encounter (Signed)
Letter has been written and placed in the mail for pt. Nothing further needed.

## 2019-01-05 NOTE — Progress Notes (Signed)
Virtual Visit via Telephone Note  I connected with Shannon Knapp on 01/07/19 at  4:00 PM EDT by telephone and verified that I am speaking with the correct person using two identifiers.  Location: Patient: Home Provider: Office Lexicographer- [facility] - [address], [address], [place], [place] [address]   I discussed the limitations, risks, security and privacy concerns of performing an evaluation and management service by telephone and the availability of in person appointments. I also discussed with the patient that there may be a patient responsible charge related to this service. The patient expressed understanding and agreed to proceed.  Patient consented to consult via telephone: Yes People present and their role in pt care: Pt   History of Present Illness:  33 year old former smoker followed in our office for severe persistant eosinophillic asthma   PMH: Hypertension Smoker/ Smoking History: Former Smoker.  Quit 2019.  1/2 pack year smoking history. Maintenance:  Dulera 200, standing prednisone  Pt of: Dr. Isaiah SergeMannam  Chief complaint:    33 year old male former smoker followed in our office for severe persistent eosinophilic asthma.  Patient is currently maintained on Dulera 200 (patient received samples as he does not currently have insurance) and a standing daily dose of prednisone.  Patient reports he has been doing well.  He has not had to use his rescue inhaler at all since last office visit.  He reports he has enough Dulera 200 at home.  He does need a refill and standing dose of prednisone.  Patient reports that he is typically using around 10 mg prednisone daily.  He is also maintained on daily Singulair.  Patient works full-time at [employer] but unfortunately does not have [employer] insurance yet.  He reports that he is still waiting and should receive health insurance over the next couple of weeks.  We have been waiting for the patient to receive health insurance that we can get  him qualified for biologic therapy.  Observations/Objective:  Data Reviewed:  Imaging CT chest 10/22/2017- bronchial wall thickening, dependent atelectasis in the lower lobe. CT abdomen pelvis 10/27/2017- no acute intra-abdominal process. Chest x-ray 11/28/2017-hyperinflated lungs, no acute abnormality  PFTs 10/30/2017 FVC 5.81 [120%], FEV1 2.38 [61%], F/F 41, TLC 166%, RV/TLC 221%, DLCO 104%, Poor patient effort,  Patient did not want albuterol Moderate obstructive airway disease with overinflation, trapping.  Normal diffusion defect.  FENO  10/17/2017-95 12/02/2017-54  ACQ6 03/16/18- 3  Labs CBC 11/28/2017-WBC 10.1, eos 14%, absolute eosinophil count 1400 IgE 10/22/2017-2209  09/16/2018-SARS-CoV-2-negative  10/29/2018-SARS-CoV-2-negative  10/29/2018-IgE-1550 10/29/2018-CBC with differential-eosinophils relative 1.9, eosinophils absolute 0.1 (patient receiving steroids during this blood work)  Assessment and Plan:  Severe persistent asthma Plan: Please present to our office to start the paperwork for Fasenra Please continue standing daily order of prednisone (can take 0.5 to 2 tablets daily) Continue Dulera 200 Continue rescue inhaler as needed Continue Singulair Follow-up with our office in 4 to 6 weeks in person Patient needs flu vaccine as well as Pneumovax 23 at next office visit  Healthcare maintenance Plan: Patient has standing referral to community health and wellness We can further reevaluate at next office visit   Follow Up Instructions:  Return in about 6 weeks (around 02/18/2019), or if symptoms worsen or fail to improve, for Follow up with Dr. Isaiah SergeMannam, Follow up with Elisha HeadlandBrian Tori Dattilio FNP-C.   I discussed the assessment and treatment plan with the patient. The patient was provided an opportunity to ask questions and all were answered. The  patient agreed with the plan and demonstrated an understanding of the instructions.   The patient was advised to call back or seek an  in-person evaluation if the symptoms worsen or if the condition fails to improve as anticipated.  I provided 23 minutes of non-face-to-face time during this encounter.   [name], NP

## 2019-01-07 ENCOUNTER — Encounter: Payer: Self-pay | Admitting: Pulmonary Disease

## 2019-01-07 ENCOUNTER — Other Ambulatory Visit: Payer: Self-pay

## 2019-01-07 ENCOUNTER — Ambulatory Visit (INDEPENDENT_AMBULATORY_CARE_PROVIDER_SITE_OTHER): Payer: HRSA Program | Admitting: Pulmonary Disease

## 2019-01-07 DIAGNOSIS — J4550 Severe persistent asthma, uncomplicated: Secondary | ICD-10-CM

## 2019-01-07 DIAGNOSIS — Z0000 Encounter for general adult medical examination without abnormal findings: Secondary | ICD-10-CM

## 2019-01-07 MED ORDER — PREDNISONE 10 MG PO TABS: ORAL_TABLET | ORAL | 3 refills | Status: DC

## 2019-01-07 NOTE — Patient Instructions (Signed)
You were seen today by [name], NP  for:   1. Severe persistent asthma, unspecified whether complicated  Dulera 242 >>> 2 puffs in the morning right when you wake up, rinse out your mouth after use, 12 hours later 2 puffs, rinse after use >>> Take this daily, no matter what >>> This is not a rescue inhaler   Only use your albuterol as a rescue medication to be used if you can't catch your breath by resting or doing a relaxed purse lip breathing pattern.  - The less you use it, the better it will work when you need it. - Ok to use up to 2 puffs  every 4 hours if you must but call for immediate appointment if use goes up over your usual need - Don't leave home without it !!  (think of it like the spare tire for your car)    - predniSONE (DELTASONE) 10 MG tablet; Can take 0.5 (5 mg total) to 2 tablets (20mg ) daily as needed based on symptoms  Dispense: 45 tablet; Refill: 3  Continue daily Singulair  We need to start paperwork for you to start biologic injections for management of your asthma this medication is called for Berna Bue >>> Please contact our office or show up to our office on 01/08/2019 so that way we can complete this  Received flu vaccine in the fall Receive pneumonia vaccine at next office visit  We recommend today:  No orders of the defined types were placed in this encounter.  No orders of the defined types were placed in this encounter.  Meds ordered this encounter  Medications  . predniSONE (DELTASONE) 10 MG tablet    Sig: Can take 0.5 (5 mg total) to 2 tablets (20mg ) daily as needed based on symptoms    Dispense:  45 tablet    Refill:  3    Follow Up:    Return in about 6 weeks (around 02/18/2019), or if symptoms worsen or fail to improve, for Follow up with Dr. Vaughan Browner, Follow up with Wyn Quaker FNP-C.   Please do your part to reduce the spread of COVID-19:      Reduce your risk of any infection  and COVID19 by using the similar precautions used for  avoiding the common cold or flu:  Marland Kitchen Wash your hands often with soap and warm water for at least 20 seconds.  If soap and water are not readily available, use an alcohol-based hand sanitizer with at least 60% alcohol.  . If coughing or sneezing, cover your mouth and nose by coughing or sneezing into the elbow areas of your shirt or coat, into a tissue or into your sleeve (not your hands). Langley Gauss A MASK when in public  . Avoid shaking hands with others and consider head nods or verbal greetings only. . Avoid touching your eyes, nose, or mouth with unwashed hands.  . Avoid close contact with people who are sick. . Avoid places or events with large numbers of people in one location, like concerts or sporting events. . If you have some symptoms but not all symptoms, continue to monitor at home and seek medical attention if your symptoms worsen. . If you are having a medical emergency, call 911.   [place] / e-Visit: [email or web address]         [facility]: [facility]: [phone]  [facility]: [phone]     It is flu season:   >>> Best ways to protect herself from the flu: Receive the yearly flu vaccine, practice good hand hygiene washing with soap and also using hand sanitizer when available, eat a nutritious meals, get adequate rest, hydrate appropriately   Please contact the office if your symptoms worsen or you have concerns that you are not improving.   Thank you for choosing [facility] for your healthcare, and for allowing [name] to partner with you on your healthcare journey. I am thankful to be able to provide care to you today.   Wyn Quaker FNP-C

## 2019-01-07 NOTE — Assessment & Plan Note (Signed)
Plan: Patient has standing referral to community health and wellness We can further reevaluate at next office visit

## 2019-01-07 NOTE — Assessment & Plan Note (Signed)
Plan: Please present to our office to start the paperwork for Fasenra Please continue standing daily order of prednisone (can take 0.5 to 2 tablets daily) Continue Dulera 200 Continue rescue inhaler as needed Continue Singulair Follow-up with our office in 4 to 6 weeks in person Patient needs flu vaccine as well as Pneumovax 23 at next office visit

## 2019-01-11 ENCOUNTER — Telehealth: Payer: Self-pay | Admitting: Pulmonary Disease

## 2019-01-11 NOTE — Telephone Encounter (Signed)
01/11/2019 Garrett,   Can we please contact the patient and see what his status is.  He was due to come into our office to fill out his portion of the [place] paperwork.  He has not done this yet.  Ria Comment has this paperwork already for him to complete.  He needs to come into our office in order to complete this.  Wyn Quaker, FNP

## 2019-01-11 NOTE — Telephone Encounter (Signed)
Called patient but he did not answer. Left message for him to call back. Will follow up again tomorrow.

## 2019-01-15 NOTE — Telephone Encounter (Signed)
ATC patient.  Left message for Patient to come by office to sign paperwork or call.

## 2019-01-19 NOTE — Telephone Encounter (Signed)
Can we attempt to reach the patient again.  Patient needs to come by to start paperwork for [facility].  The paperwork is already been started for him.  When I spoke with him last week he said that he would be able to do this.  If he has changed his mind he needs to contact our office.  Patient also has been referred to establish primary care with community health and wellness.  Preferably Dr. Joya Gaskins or Dr. Lenna Gilford.  Patient can contact: [phone] to establish care with them.   Wyn Quaker FNP

## 2019-01-19 NOTE — Telephone Encounter (Signed)
Just spoke with Salome Arnt.  She confirms that we do not have paperwork on patient yet.  Can we continue to follow-up with the patient regarding need for additional signatures and for him to complete his [place] paperwork.  Wyn Quaker FNP

## 2019-01-19 NOTE — Telephone Encounter (Signed)
Patient called back. He stated that he was here about 2 weeks ago and signed the papers while in the parking lot. He gave the papers to the person who was working in [employer]. He is unable to remember the exact date.

## 2019-01-19 NOTE — Telephone Encounter (Signed)
Can we follow up with biologic team on this? Ria Comment do you have these records?  Aaron Edelman

## 2019-01-19 NOTE — Telephone Encounter (Signed)
Called patient, call went straight to VM. Left message for patient to call back.  

## 2019-01-21 NOTE — Telephone Encounter (Signed)
Contacted and spoke with the patient.  Unfortunately the paperwork that the patient is filled out has been misplaced.  Specifically instructed the patient to come back and fill out the paperwork that was started by [place].  This is in the biologic folder.  Patient can hand this paperwork either to myself, Mendel Ryder, or Lattie Haw. Pt will try to come by tomorrow to do this (01/22/2019).Will route to injection pool for follow-up.  Wyn Quaker FNP

## 2019-01-27 NOTE — Telephone Encounter (Signed)
Spoke with Shannon Knapp this morning. Pt still has not come by the office to sign his paperwork. I suggested we mail these forms to the pt, Shannon Knapp agreed.  LMTCB x1 for pt.

## 2019-01-28 NOTE — Telephone Encounter (Signed)
Forms have been mailed to pt per Aaron Edelman.

## 2019-01-28 NOTE — Telephone Encounter (Signed)
We have attempted to contact this patient enough.  I would just go ahead and mail a letter as well as the forms to the patient.  Patient can contact our office whenever he is ready to stop by and start the paperwork.  It is unfortunate that he did come by the office completed paperwork for than people lost it.  But is also unfortunate the patient has not made time to come in even though he said that he would develop the paperwork and handed to the correct individuals.Wyn Quaker, FNP

## 2019-02-18 ENCOUNTER — Ambulatory Visit: Payer: Self-pay | Admitting: Pulmonary Disease

## 2019-04-27 ENCOUNTER — Ambulatory Visit: Payer: Self-pay | Admitting: Pulmonary Disease

## 2019-04-28 ENCOUNTER — Telehealth: Payer: Self-pay | Admitting: Pulmonary Disease

## 2019-04-28 ENCOUNTER — Ambulatory Visit: Payer: Self-pay | Admitting: Pulmonary Disease

## 2019-04-28 DIAGNOSIS — J4550 Severe persistent asthma, uncomplicated: Secondary | ICD-10-CM

## 2019-04-28 MED ORDER — PREDNISONE 10 MG PO TABS: ORAL_TABLET | ORAL | 0 refills | Status: DC

## 2019-04-28 MED ORDER — BREZTRI AEROSPHERE 160-9-4.8 MCG/ACT IN AERO: 2.0000 | INHALATION_SPRAY | Freq: Two times a day (BID) | RESPIRATORY_TRACT | 0 refills | Status: AC

## 2019-04-28 NOTE — Telephone Encounter (Signed)
Pt called backed to report he was waiting in the parking lot for an hour for this sample.   Pt was told to come to the front door.   Aaron Edelman and myself spoke with patient in the building. Samples given. Refill sent at request of Aaron Edelman.   Nothing further needed at this time.

## 2019-04-28 NOTE — Telephone Encounter (Signed)
Dr. Vaughan Browner please disregard message. BM has addressed. Thanks.

## 2019-04-28 NOTE — Telephone Encounter (Signed)
Call returned to pt, confirmed DOB, he is requesting a refill of his prednisone and a sample of dulera. He reports nasal drainage and waking up coughing and feeling more SOB since being off of his prednisone. He has been off the prednisone for about 5 days. He states he does not want to go back to the hospital due to not being able to get his medications due to cost. I made him aware we could offer patient assistance however I would have to speak with a provider about the prednisone since his last visit was in august. Voiced understanding. He states every time he comes off the prednisone he gets congested, develops a cough, and becomes SOB.   PM please advise. Thanks.

## 2019-04-29 NOTE — Progress Notes (Deleted)
@Patient  ID: Shannon Knapp, male    DOB: 07/19/1985, 33 y.o.   MRN: [id number]  No chief complaint on file.   Referring provider: No ref. provider found  HPI:  33 year old former smoker followed in our office for severe persistant eosinophillic asthma   PMH: Hypertension Smoker/ Smoking History: Former Smoker.  Quit 2019.  1/2 pack year smoking history. Maintenance:  Dulera 200, standing prednisone  Pt of: Dr. Isaiah SergeMannam  04/29/2019  - Visit   33 year old male former smoker followed in our office for severe persistent eosinophilic asthma.  This office visit was prompted because the patient [facility] 04/28/2019 reporting that he was out of his inhaler as well as out of his prednisone.  He was very concerned that he would end up back in the emergency room as he has done many times in the past.  Patient continues to have issues with insurance coverage and medication access.  Unfortunately we did not have Dulera samples.  The only samples that we had that would be adequate for the patient [facility]  Patient was given 2 weeks worth.  And encouraged to follow-up with her office today on 04/30/2019.  Prednisone was also refilled.  Explained to patient he needs to have follow-up visit with our office.  We have routinely struggled with follow-up with the patient as we are trying to get him qualified for Biologics to better manage his breathing.  Last note regarding this was on 01/28/2019 where we had mailed the patient the for Center application.  Patient was last seen in our office in August/2020 as a virtual visit.  Questionaires / Pulmonary Flowsheets:   ACT:  No flowsheet data found.  MMRC: No flowsheet data found.  Epworth:  No flowsheet data found.  Tests:   Imaging CT chest 10/22/2017- bronchial wall thickening, dependent atelectasis in the lower lobe. CT abdomen pelvis 10/27/2017- no acute intra-abdominal process. Chest  x-ray 11/28/2017-hyperinflated lungs, no acute abnormality  PFTs 10/30/2017 FVC 5.81 [120%], FEV1 2.38 [61%], F/F 41, TLC 166%, RV/TLC 221%, DLCO 104%, Poor patient effort,  Patient did not want albuterol Moderate obstructive airway disease with overinflation, trapping.  Normal diffusion defect.  FENO  10/17/2017-95 12/02/2017-54  ACQ6 03/16/18- 3  Labs CBC 11/28/2017-WBC 10.1, eos 14%, absolute eosinophil count 1400 IgE 10/22/2017-2209  09/16/2018-SARS-CoV-2-negative  10/29/2018-SARS-CoV-2-negative  10/29/2018-IgE-1550 10/29/2018-CBC with differential-eosinophils relative 1.9, eosinophils absolute 0.1 (patient receiving steroids during this blood work)   FENO:  Lab Results  Component Value Date   NITRICOXIDE 95 10/17/2017    PFT: PFT Results Latest Ref Rng & Units 10/29/2017  FVC-Pre L 5.81  FVC-Predicted Pre % 124  Pre FEV1/FVC % % 41  FEV1-Pre L 2.38  FEV1-Predicted Pre % 61  DLCO UNC% % 104  DLCO COR %Predicted % 97  TLC L 11.87  TLC % Predicted % 166  RV % Predicted % 360    WALK:  No flowsheet data found.  Imaging: No results found.  Lab Results:  CBC    Component Value Date/Time   WBC 7.1 10/29/2018 1646   RBC 5.02 10/29/2018 1646   HGB 15.5 10/29/2018 1646   HCT 46.8 10/29/2018 1646   PLT 263.0 10/29/2018 1646   MCV 93.3 10/29/2018 1646   MCH 30.8 10/29/2018 0540   MCHC 33.2 10/29/2018 1646   RDW 13.8 10/29/2018 1646   LYMPHSABS 1.6 10/29/2018 1646   MONOABS 0.5 10/29/2018 1646   EOSABS 0.1 10/29/2018  1646   BASOSABS 0.1 10/29/2018 1646    BMET    Component Value Date/Time   NA 139 10/29/2018 0540   K 3.9 10/29/2018 0540   CL 104 10/29/2018 0540   CO2 26 10/29/2018 0540   GLUCOSE 92 10/29/2018 0540   BUN 11 10/29/2018 0540   CREATININE 0.83 10/29/2018 0540   CALCIUM 9.2 10/29/2018 0540   GFRNONAA >60 10/29/2018 0540   GFRAA >60 10/29/2018 0540    BNP No results found for: BNP  ProBNP No results found for: PROBNP  Specialty  Problems      Pulmonary Problems   Respiratory distress   Severe persistent acute asthmatic bronchitis   Status asthmaticus   Acute respiratory distress   Acute respiratory failure with hypoxemia (HCC)   Asthma exacerbation   Cough   Hemoptysis   Allergic rhinitis   Upper airway cough syndrome   Severe persistent asthma    PFTs 10/30/2017 FVC 5.81 [120%], FEV1 2.38 [61%], F/F 41, TLC 166%, RV/TLC 221%, DLCO 104%, Poor patient effort,  Patient did not want albuterol Moderate obstructive airway disease with overinflation, trapping.  Normal diffusion defect.  FENO  10/17/2017-95 12/02/2017-54  ACQ6 03/16/18- 3  Labs CBC 11/28/2017-WBC 10.1, eos 14%, absolute eosinophil count 1400 IgE 10/22/2017-2209 10/29/2018-IgE-1550         Allergies  Allergen Reactions  . Amoxicillin Hives    Has patient had a PCN reaction causing immediate rash, facial/tongue/throat swelling, SOB or lightheadedness with hypotension: Yes Has patient had a PCN reaction causing severe rash involving mucus membranes or skin necrosis: No Has patient had a PCN reaction that required hospitalization: No Has patient had a PCN reaction occurring within the last 10 years: Yes If all of the above answers are "NO", then may proceed with Cephalosporin use.  . Fish Allergy Swelling  . Shellfish Allergy Swelling  . Penicillins Hives    Has patient had a PCN reaction causing immediate rash, facial/tongue/throat swelling, SOB or lightheadedness with hypotension: Yes Has patient had a PCN reaction causing severe rash involving mucus membranes or skin necrosis: No Has patient had a PCN reaction that required hospitalization: No Has patient had a PCN reaction occurring within the last 10 years: Yes If all of the above answers are "NO", then may proceed with Cephalosporin use.    Immunization History  Administered Date(s) Administered  . Pneumococcal Polysaccharide-23 10/19/2017    Past Medical History:  Diagnosis  Date  . Asthma   . Chronic bronchitis ([place])   . COPD (chronic obstructive pulmonary disease) (HCC)     Tobacco History: Social History   Tobacco Use  Smoking Status Former Smoker  . Packs/day: 0.25  . Years: 14.00  . Pack years: 3.50  . Types: Cigarettes  . Start date: 2006  . Quit date: 08/2018  . Years since quitting: 0.6  Smokeless Tobacco Never Used  Tobacco Comment   4-5 cigarettes daily   Counseling given: Not Answered Comment: 4-5 cigarettes daily   Continue to not smoke  Outpatient Encounter Medications as of 04/30/2019  Medication Sig  . albuterol (PROVENTIL) (2.5 MG/3ML) 0.083% nebulizer solution Take 3 mLs (2.5 mg total) by nebulization every 4 (four) hours as needed for up to 30 days for wheezing or shortness of breath. Use twice daily and as needed for coughing  . benzonatate (TESSALON) 100 MG capsule Take 1 capsule (100 mg total) by mouth 3 (three) times daily as needed for cough.  . Budeson-Glycopyrrol-Formoterol (BREZTRI AEROSPHERE) 160-9-4.8 MCG/ACT AERO Inhale  2 puffs into the lungs 2 (two) times daily.  . budesonide-formoterol (SYMBICORT) 160-4.5 MCG/ACT inhaler Inhale 2 puffs into the lungs 2 (two) times a day for 1 day.  . budesonide-formoterol (SYMBICORT) 160-4.5 MCG/ACT inhaler Inhale 2 puffs into the lungs 2 (two) times a day for 1 day.  . cyclobenzaprine (FLEXERIL) 10 MG tablet Take 10 mg by mouth 3 (three) times daily.  . Fluticasone-Salmeterol (ADVAIR DISKUS) 100-50 MCG/DOSE AEPB Inhale 1 puff into the lungs 2 (two) times daily for 30 days. (Patient not taking: Reported on 10/29/2018)  . guaiFENesin (MUCINEX) 600 MG 12 hr tablet Take 1,200 mg by mouth daily.  Marland Kitchen guaiFENesin-codeine 100-10 MG/5ML syrup Take 5 mLs by mouth every 6 (six) hours as needed for cough. (Patient not taking: Reported on 10/29/2018)  . loratadine (CLARITIN) 10 MG tablet Take 1 tablet (10 mg total) by mouth daily. (Patient not taking: Reported on 11/26/2018)  . mometasone-formoterol  (DULERA) 200-5 MCG/ACT AERO Inhale 2 puffs into the lungs 2 (two) times daily.  . mometasone-formoterol (DULERA) 200-5 MCG/ACT AERO Inhale 2 puffs into the lungs 2 (two) times a day.  . montelukast (SINGULAIR) 10 MG tablet Take 1 tablet (10 mg total) by mouth at bedtime.  Marland Kitchen oxyCODONE-acetaminophen (PERCOCET) 5-325 MG tablet Take 1 tablet by mouth every 4 (four) hours as needed for severe pain. (Patient not taking: Reported on 10/29/2018)  . predniSONE (DELTASONE) 10 MG tablet Can take 0.5 (5 mg total) to 2 tablets (20mg ) daily as needed based on symptoms   No facility-administered encounter medications on file as of 04/30/2019.     Review of Systems  Review of Systems   Physical Exam  There were no vitals taken for this visit.  Wt Readings from Last 5 Encounters:  11/26/18 169 lb (76.7 kg)  10/29/18 162 lb 12.8 oz (73.8 kg)  10/09/18 165 lb (74.8 kg)  10/09/18 165 lb (74.8 kg)  09/17/18 165 lb (74.8 kg)    BMI Readings from Last 5 Encounters:  11/26/18 23.57 kg/m  10/29/18 22.71 kg/m  10/09/18 23.01 kg/m  10/09/18 23.01 kg/m  09/17/18 23.01 kg/m     Physical Exam    Assessment & Plan:   No problem-specific Assessment & Plan notes found for this encounter.    No follow-ups on file.   09/19/18, NP 04/29/2019   This appointment was *** minutes long with over 50% of the time in direct face-to-face patient care, assessment, plan of care, and follow-up.

## 2019-04-30 ENCOUNTER — Ambulatory Visit: Payer: Self-pay | Admitting: Pulmonary Disease

## 2019-04-30 ENCOUNTER — Telehealth: Payer: Self-pay | Admitting: Pulmonary Disease

## 2019-04-30 DIAGNOSIS — Z79899 Other long term (current) drug therapy: Secondary | ICD-10-CM | POA: Insufficient documentation

## 2019-04-30 NOTE — Telephone Encounter (Signed)
04/30/2019 1437  Triage,  This patient needs to be scheduled with Dr. Vaughan Browner, myself or another APP to further evaluate his asthma.  He also needs help with coordinating a biologic start.  Patient no showed to the office visit that I helped coordinate with him in person on 04/28/2019 in our [place] office.  He no-showed to that office visit today 04/30/2019 in the [place] office.  Patient lives in [place].  Patient confirmed the appointment on 04/29/2019.  If you do reach the patient please explain to him its very difficult for [name] to manage his breathing if he continues to no-show to our office visits.  We also need more time than less than a days notice when he is out of his medications especially if he is requesting samples or needing assistance with affording his medications.  Wyn Quaker, FNP

## 2019-04-30 NOTE — Telephone Encounter (Signed)
Spoke with patient. He apologized for missing today's appointment. He wanted to know if he could get scheduled for another OV in [place] on Monday. Advised him that Aaron Edelman will not be in [place] then, but I could get him scheduled for a televisit if Aaron Edelman feels this would be ok.   Due to the time, I went ahead and scheduled him for a televisit on Monday with Aaron Edelman. He is aware of appt.   Nothing further needed.

## 2019-05-02 NOTE — Progress Notes (Signed)
Virtual Visit via Telephone Note  I connected with Shannon Knapp on 05/03/19 at 11:00 AM EST by telephone and verified that I am speaking with the correct person using two identifiers.  Location: Patient: Home Provider: Office [facility] - [address] [place], [address], [place], [place] [address]   I discussed the limitations, risks, security and privacy concerns of performing an evaluation and management service by telephone and the availability of in person appointments. I also discussed with the patient that there may be a patient responsible charge related to this service. The patient expressed understanding and agreed to proceed.  Patient consented to consult via telephone: Yes People present and their role in pt care: Pt   History of Present Illness:  33 year old former smoker followed in our office for severe persistant eosinophillic asthma   PMH: Hypertension Smoker/ Smoking History: Former Smoker.  Quit 2019.  1/2 pack year smoking history. Maintenance:  Breztri, standing prednisone - 10mg   Pt of: Dr.  Chief complaint: 1 week televisit / med follow up   33 year old male former smoker completing a televisit with our office today.  This is a 1 week follow-up for when patient showed up in our office on 04/28/2019 reporting he had been off of his inhalers as well as his prednisone for 1.5 weeks.  Patient was started on Breztri at that time due to limited supply of other ICS/LABA options in our sample closet.  Patient feels that this inhaler has helped.  He is using his rescue inhaler 1-2 times a day.  Patient is also taking his prednisone 10 mg daily.  He feels that his breathing is getting back to baseline.  He reports that he had been off of his Dulera as well as prednisone for 10 to 14 days.  Patient also reports that he mailed back the [place] application that was mailed to him.  We do not have any records of receiving this.  I have checked with the clinical injection  pool liaison who reports that we have not received any information for this.  We will check with [employer].   Patient was scheduled for a follow-up in [place] which is the same city where he lives.  He still no showed for that appointment.  This televisit today is to follow-up with how he is doing on the new inhaler.  Patient feels that he is getting back to baseline.  We continue to struggle with medication access.  Patient reports that he is now on unemployment and is not working.  He reports that he can receive insurance through unemployment.  He reports he should get more information regarding this on 05/09/2019.  Observations/Objective:  Imaging CT chest 10/22/2017- bronchial wall thickening, dependent atelectasis in the lower lobe. CT abdomen pelvis 10/27/2017- no acute intra-abdominal process. Chest x-ray 11/28/2017-hyperinflated lungs, no acute abnormality  PFTs 10/30/2017 FVC 5.81 [120%], FEV1 2.38 [61%], F/F 41, TLC 166%, RV/TLC 221%, DLCO 104%, Poor patient effort,  Patient did not want albuterol Moderate obstructive airway disease with overinflation, trapping.  Normal diffusion defect.  FENO  10/17/2017-95 12/02/2017-54  ACQ6 03/16/18- 3  Labs CBC 11/28/2017-WBC 10.1, eos 14%, absolute eosinophil count 1400 IgE 10/22/2017-2209  09/16/2018-SARS-CoV-2-negative  10/29/2018-SARS-CoV-2-negative  10/29/2018-IgE-1550 10/29/2018-CBC with differential-eosinophils relative 1.9, eosinophils absolute 0.1 (patient receiving steroids during this blood work)   Social History   Tobacco Use  Smoking Status Former Smoker  . Packs/day: 0.25  . Years: 14.00  . Pack years: 3.50  . Types: Cigarettes  .  Start date: 2006  . Quit date: 08/2018  . Years since quitting: 0.7  Smokeless Tobacco Never Used  Tobacco Comment   4-5 cigarettes daily   Immunization History  Administered Date(s) Administered  . Pneumococcal Polysaccharide-23 10/19/2017      Assessment and Plan:  Severe  persistent asthma Plan: We will check with the clinical injection pool liter regarding the status of the [employer] application  Please continue standing daily order prednisone can take between 0.5 to 2 tablets daily based off your symptoms Continue Breztri inhaler, samples placed upfront today Continue risk inhaler as needed Continue Singulair Patient needs 2-week follow-up visit to meet with clinical pharmacy team, insurance formulary information and hand as well as to see either Dr. Vaughan Browner or myself   Healthcare maintenance Plan: Contact information for community health and wellness provided on patient's AVS, patient encouraged to contact them as he currently has no insurance and no primary care  Medication management Emphasized repeatedly to the patient today that he needs to notify our office in advance prior to running out of his medications.  We need at least 7 days notice prior to the patient running out in order for [name] to best help him  With patient's hopeful upcoming insurance on 05/09/2019 we will bring him into receive help from the clinical pharmacy team regarding his medication access.  Plan: 2 to 3-week follow-up with the clinical pharmacy team to help with medication access this patient struggles with affording his inhalers   Follow Up Instructions:  Return in about 2 weeks (around 05/17/2019), or if symptoms worsen or fail to improve, for Follow up with Dr. Vaughan Browner, Follow up with Wyn Quaker FNP-C, clinical pharmacy team appt.   I discussed the assessment and treatment plan with the patient. The patient was provided an opportunity to ask questions and all were answered. The patient agreed with the plan and demonstrated an understanding of the instructions.   The patient was advised to call back or seek an in-person evaluation if the symptoms worsen or if the condition fails to improve as anticipated.  I provided 28 minutes of non-face-to-face time during this  encounter.   Shannon Rinne, NP

## 2019-05-03 ENCOUNTER — Ambulatory Visit (INDEPENDENT_AMBULATORY_CARE_PROVIDER_SITE_OTHER): Payer: Self-pay | Admitting: Pulmonary Disease

## 2019-05-03 ENCOUNTER — Encounter: Payer: Self-pay | Admitting: Pulmonary Disease

## 2019-05-03 ENCOUNTER — Other Ambulatory Visit: Payer: Self-pay

## 2019-05-03 DIAGNOSIS — Z0000 Encounter for general adult medical examination without abnormal findings: Secondary | ICD-10-CM

## 2019-05-03 DIAGNOSIS — Z79899 Other long term (current) drug therapy: Secondary | ICD-10-CM

## 2019-05-03 DIAGNOSIS — J4550 Severe persistent asthma, uncomplicated: Secondary | ICD-10-CM

## 2019-05-03 MED ORDER — BREZTRI AEROSPHERE 160-9-4.8 MCG/ACT IN AERO: 2.0000 | INHALATION_SPRAY | Freq: Two times a day (BID) | RESPIRATORY_TRACT | 0 refills | Status: DC

## 2019-05-03 MED ORDER — PREDNISONE 10 MG PO TABS: ORAL_TABLET | ORAL | 1 refills | Status: DC

## 2019-05-03 NOTE — Assessment & Plan Note (Signed)
Emphasized repeatedly to the patient today that he needs to notify our office in advance prior to running out of his medications.  We need at least 7 days notice prior to the patient running out in order for [name] to best help him  With patient's hopeful upcoming insurance on 05/09/2019 we will bring him into receive help from the clinical pharmacy team regarding his medication access.  Plan: 2 to 3-week follow-up with the clinical pharmacy team to help with medication access this patient struggles with affording his inhalers

## 2019-05-03 NOTE — Telephone Encounter (Signed)
Ria Comment   Can we check on the status of this paperwork. Pt states he mailed this back. Unsure if it was mailed to [name] or Rose FNP

## 2019-05-03 NOTE — Telephone Encounter (Signed)
Forms were never received here in our office.

## 2019-05-03 NOTE — Assessment & Plan Note (Signed)
Plan: Contact information for community health and wellness provided on patient's AVS, patient encouraged to contact them as he currently has no insurance and no primary care

## 2019-05-03 NOTE — Assessment & Plan Note (Signed)
Plan: We will check with the clinical injection pool liter regarding the status of the [employer] application  Please continue standing daily order prednisone can take between 0.5 to 2 tablets daily based off your symptoms Continue Breztri inhaler, samples placed upfront today Continue risk inhaler as needed Continue Singulair Patient needs 2-week follow-up visit to meet with clinical pharmacy team, insurance formulary information and hand as well as to see either Dr. Vaughan Browner or myself

## 2019-05-03 NOTE — Patient Instructions (Addendum)
You were seen today by Shannon Ceo, NP  for:   1. Severe persistent asthma, unspecified whether complicated  - predniSONE (DELTASONE) 10 MG tablet; Can take 0.5 (5 mg total) to 2 tablets (20mg ) daily as needed based on symptoms  Dispense: 45 tablet; Refill: 1  Continue Breztri >>> 2 puffs in the morning right when you wake up, rinse out your mouth after use, 12 hours later 2 puffs, rinse after use >>> Take this daily, no matter what >>> This is not a rescue inhaler   Continue to take prednisone as outlined  Continue Singulair Continue daily allergy pill  It is extremely important that you keep regular follow-up with our office, come to appointments on time, do not no-show for appointments in order for to best manage your care   2. Medication management  Contact our office immediately as soon as you receive your insurance information on 05/09/2019 through an appointment  Please present to our office in 2 weeks for an appointment with the clinical pharmacy team for:  05/11/2019 Medication Management  . Medication reconciliation  . Medication Access  . Inhaler teaching    We will provide samples again of Breztri today   It is also extremely important that you contact your office 1 week in advance when you know that you are unable to maintain these medications or get refills.  When you are off of your inhalers as well as your prednisone you are at a high likelihood of being hospitalized we need to know 1 week in advance prior to you being in the situation so we can best help you  3. Healthcare maintenance  Please contact community health and wellness to establish there for primary care: >>> [phone] to schedule an appointment   We recommend today:  No orders of the defined types were placed in this encounter.  No orders of the defined types were placed in this encounter.  Meds ordered this encounter  Medications  . predniSONE (DELTASONE) 10 MG tablet    Sig: Can take 0.5 (5  mg total) to 2 tablets (20mg ) daily as needed based on symptoms    Dispense:  45 tablet    Refill:  1    Follow Up:    Return in about 2 weeks (around 05/17/2019), or if symptoms worsen or fail to improve, for Follow up with Dr. , Follow up with 05/19/2019 FNP-C, clinical pharmacy team appt.   Please do your part to reduce the spread of COVID-19:      Reduce your risk of any infection  and COVID19 by using the similar precautions used for avoiding the common cold or flu:  Shannon Knapp your hands often with soap and warm water for at least 20 seconds.  If soap and water are not readily available, use an alcohol-based hand sanitizer with at least 60% alcohol.  . If coughing or sneezing, cover your mouth and nose by coughing or sneezing into the elbow areas of your shirt or coat, into a tissue or into your sleeve (not your hands). Shannon Knapp A MASK when in public  . Avoid shaking hands with others and consider head nods or verbal greetings only. . Avoid touching your eyes, nose, or mouth with unwashed hands.  . Avoid close contact with people who are sick. . Avoid places or events with large numbers of people in one location, like concerts or sporting events. . If you have some symptoms but not all symptoms, continue to  monitor at home and seek medical attention if your symptoms worsen. . If you are having a medical emergency, call 911.   ADDITIONAL HEALTHCARE OPTIONS FOR PATIENTS  [facility] / e-Visit: [email or web address]         [facility]: [phone]  [facility]: [phone]                   [facility]: [phone]     It is flu season:   >>> Best ways to protect herself from the flu: Receive the yearly flu vaccine, practice good hand hygiene washing with soap and also using hand sanitizer when available, eat a nutritious meals, get adequate rest, hydrate appropriately   Please  contact the office if your symptoms worsen or you have concerns that you are not improving.   Thank you for choosing [name] for your healthcare, and for allowing Shannon Knapp to partner with you on your healthcare journey. I am thankful to be able to provide care to you today.   Shannon Headland FNP-C     Asthma, Adult  Asthma is a long-term (chronic) condition in which the airways get tight and narrow. The airways are the breathing passages that lead from the nose and mouth down into the lungs. A person with asthma will have times when symptoms get worse. These are called asthma attacks. They can cause coughing, whistling sounds when you breathe (wheezing), shortness of breath, and chest pain. They can make it hard to breathe. There is no cure for asthma, but medicines and lifestyle changes can help control it. There are many things that can bring on an asthma attack or make asthma symptoms worse (triggers). Common triggers include:  Mold.  Dust.  Cigarette smoke.  Cockroaches.  Things that can cause allergy symptoms (allergens). These include animal skin flakes (dander) and pollen from trees or grass.  Things that pollute the air. These may include household cleaners, wood smoke, smog, or chemical odors.  Cold air, weather changes, and wind.  Crying or laughing hard.  Stress.  Certain medicines or drugs.  Certain foods such as dried fruit, potato chips, and grape juice.  Infections, such as a cold or the flu.  Certain medical conditions or diseases.  Exercise or tiring activities. Asthma may be treated with medicines and by staying away from the things that cause asthma attacks. Types of medicines may include:  Controller medicines. These help prevent asthma symptoms. They are usually taken every day.  Fast-acting reliever or rescue medicines. These quickly relieve asthma symptoms. They are used as needed and provide short-term relief.  Allergy medicines if your  attacks are brought on by allergens.  Medicines to help control the body's defense (immune) system. Follow these instructions at home: Avoiding triggers in your home  Change your heating and air conditioning filter often.  Limit your use of fireplaces and wood stoves.  Get rid of pests (such as roaches and mice) and their droppings.  Throw away plants if you see mold on them.  Clean your floors. Dust regularly. Use cleaning products that do not smell.  Have someone vacuum when you are not home. Use a vacuum cleaner with a HEPA filter if possible.  Replace carpet with wood, tile, or vinyl flooring. Carpet can trap animal skin flakes and dust.  Use allergy-proof pillows, mattress covers, and box spring covers.  Knapp bed sheets and blankets every week in hot water. Dry them in a dryer.  Keep  your bedroom free of any triggers.  Avoid pets and keep windows closed when things that cause allergy symptoms are in the air.  Use blankets that are made of polyester or cotton.  Clean bathrooms and kitchens with bleach. If possible, have someone repaint the walls in these rooms with mold-resistant paint. Keep out of the rooms that are being cleaned and painted.  Knapp your hands often with soap and water. If soap and water are not available, use hand sanitizer.  Do not allow anyone to smoke in your home. General instructions  Take over-the-counter and prescription medicines only as told by your doctor. ? Talk with your doctor if you have questions about how or when to take your medicines. ? Make note if you need to use your medicines more often than usual.  Do not use any products that contain nicotine or tobacco, such as cigarettes and e-cigarettes. If you need help quitting, ask your doctor.  Stay away from secondhand smoke.  Avoid doing things outdoors when allergen counts are high and when air quality is low.  Wear a ski mask when doing outdoor activities in the winter. The mask  should cover your nose and mouth. Exercise indoors on cold days if you can.  Warm up before you exercise. Take time to cool down after exercise.  Use a peak flow meter as told by your doctor. A peak flow meter is a tool that measures how well the lungs are working.  Keep track of the peak flow meter's readings. Write them down.  Follow your asthma action plan. This is a written plan for taking care of your asthma and treating your attacks.  Make sure you get all the shots (vaccines) that your doctor recommends. Ask your doctor about a flu shot and a pneumonia shot.  Keep all follow-up visits as told by your doctor. This is important. Contact a doctor if:  You have wheezing, shortness of breath, or a cough even while taking medicine to prevent attacks.  The mucus you cough up (sputum) is thicker than usual.  The mucus you cough up changes from clear or white to yellow, green, gray, or bloody.  You have problems from the medicine you are taking, such as: ? A rash. ? Itching. ? Swelling. ? Trouble breathing.  You need reliever medicines more than 2-3 times a week.  Your peak flow reading is still at 50-79% of your personal best after following the action plan for 1 hour.  You have a fever. Get help right away if:  You seem to be worse and are not responding to medicine during an asthma attack.  You are short of breath even at rest.  You get short of breath when doing very little activity.  You have trouble eating, drinking, or talking.  You have chest pain or tightness.  You have a fast heartbeat.  Your lips or fingernails start to turn blue.  You are light-headed or dizzy, or you faint.  Your peak flow is less than 50% of your personal best.  You feel too tired to breathe normally. Summary  Asthma is a long-term (chronic) condition in which the airways get tight and narrow. An asthma attack can make it hard to breathe.  Asthma cannot be cured, but medicines and  lifestyle changes can help control it.  Make sure you understand how to avoid triggers and how and when to use your medicines. This information is not intended to replace advice given to you by your  health care provider. Make sure you discuss any questions you have with your health care provider. Document Released: 10/23/2007 Document Revised: 07/09/2018 Document Reviewed: 06/10/2016 Elsevier Patient Education  2020 [employer].

## 2019-05-19 NOTE — Progress Notes (Signed)
 ERR

## 2019-05-23 NOTE — Progress Notes (Deleted)
@Patient  ID: Shannon Knapp, male    DOB: August 21, 1985, 34 y.o.   MRN: [id number]  No chief complaint on file.   Referring provider: No ref. provider found  HPI:  34 year old former smoker followed in our office for severe persistant eosinophillic asthma   PMH: Hypertension Smoker/ Smoking History: Former Smoker.  Quit 2019.  1/2 pack year smoking history. Maintenance:  Breztri, standing prednisone - 10mg   Pt of: Dr. Vaughan Browner  05/23/2019  - Visit     Questionaires / Pulmonary Flowsheets:   ACT:  No flowsheet data found.  MMRC: No flowsheet data found.  Epworth:  No flowsheet data found.  Tests:   Imaging CT chest 10/22/2017- bronchial wall thickening, dependent atelectasis in the lower lobe. CT abdomen pelvis 10/27/2017- no acute intra-abdominal process. Chest x-ray 11/28/2017-hyperinflated lungs, no acute abnormality  PFTs 10/30/2017 FVC 5.81 [120%], FEV1 2.38 [61%], F/F 41, TLC 166%, RV/TLC 221%, DLCO 104%, Poor patient effort,  Patient did not want albuterol Moderate obstructive airway disease with overinflation, trapping.  Normal diffusion defect.  FENO  10/17/2017-95 12/02/2017-54  ACQ6 03/16/18- 3  Labs CBC 11/28/2017-WBC 10.1, eos 14%, absolute eosinophil count 1400 IgE 10/22/2017-2209  09/16/2018-SARS-CoV-2-negative  10/29/2018-SARS-CoV-2-negative  10/29/2018-IgE-1550 10/29/2018-CBC with differential-eosinophils relative 1.9, eosinophils absolute 0.1 (patient receiving steroids during this blood work)    FENO:  Lab Results  Component Value Date   NITRICOXIDE 95 10/17/2017    PFT: PFT Results Latest Ref Rng & Units 10/29/2017  FVC-Pre L 5.81  FVC-Predicted Pre % 124  Pre FEV1/FVC % % 41  FEV1-Pre L 2.38  FEV1-Predicted Pre % 61  DLCO UNC% % 104  DLCO COR %Predicted % 97  TLC L 11.87  TLC % Predicted % 166  RV % Predicted % 360    WALK:  No flowsheet data found.  Imaging: No results found.  Lab Results:  CBC    Component Value  Date/Time   WBC 7.1 10/29/2018 1646   RBC 5.02 10/29/2018 1646   HGB 15.5 10/29/2018 1646   HCT 46.8 10/29/2018 1646   PLT 263.0 10/29/2018 1646   MCV 93.3 10/29/2018 1646   MCH 30.8 10/29/2018 0540   MCHC 33.2 10/29/2018 1646   RDW 13.8 10/29/2018 1646   LYMPHSABS 1.6 10/29/2018 1646   MONOABS 0.5 10/29/2018 1646   EOSABS 0.1 10/29/2018 1646   BASOSABS 0.1 10/29/2018 1646    BMET    Component Value Date/Time   NA 139 10/29/2018 0540   K 3.9 10/29/2018 0540   CL 104 10/29/2018 0540   CO2 26 10/29/2018 0540   GLUCOSE 92 10/29/2018 0540   BUN 11 10/29/2018 0540   CREATININE 0.83 10/29/2018 0540   CALCIUM 9.2 10/29/2018 0540   GFRNONAA >60 10/29/2018 0540   GFRAA >60 10/29/2018 0540    BNP No results found for: BNP  ProBNP No results found for: PROBNP  Specialty Problems      Pulmonary Problems   Respiratory distress   Severe persistent acute asthmatic bronchitis   Status asthmaticus   Acute respiratory distress   Acute respiratory failure with hypoxemia (HCC)   Asthma exacerbation   Cough   Hemoptysis   Allergic rhinitis   Upper airway cough syndrome   Severe persistent asthma    PFTs 10/30/2017 FVC 5.81 [120%], FEV1 2.38 [61%], F/F 41, TLC 166%, RV/TLC 221%, DLCO 104%, Poor patient effort,  Patient did not want albuterol Moderate obstructive airway disease with overinflation, trapping.  Normal diffusion defect.  FENO  10/17/2017-95  12/02/2017-54  ACQ6 03/16/18- 3  Labs CBC 11/28/2017-WBC 10.1, eos 14%, absolute eosinophil count 1400 IgE 10/22/2017-2209 10/29/2018-IgE-1550         Allergies  Allergen Reactions  . Amoxicillin Hives    Has patient had a PCN reaction causing immediate rash, facial/tongue/throat swelling, SOB or lightheadedness with hypotension: Yes Has patient had a PCN reaction causing severe rash involving mucus membranes or skin necrosis: No Has patient had a PCN reaction that required hospitalization: No Has patient had a PCN  reaction occurring within the last 10 years: Yes If all of the above answers are "NO", then may proceed with Cephalosporin use.  . Fish Allergy Swelling  . Shellfish Allergy Swelling  . Penicillins Hives    Has patient had a PCN reaction causing immediate rash, facial/tongue/throat swelling, SOB or lightheadedness with hypotension: Yes Has patient had a PCN reaction causing severe rash involving mucus membranes or skin necrosis: No Has patient had a PCN reaction that required hospitalization: No Has patient had a PCN reaction occurring within the last 10 years: Yes If all of the above answers are "NO", then may proceed with Cephalosporin use.    Immunization History  Administered Date(s) Administered  . Pneumococcal Polysaccharide-23 10/19/2017    Past Medical History:  Diagnosis Date  . Asthma   . Chronic bronchitis (HCC)   . COPD (chronic obstructive pulmonary disease) (HCC)     Tobacco History: Social History   Tobacco Use  Smoking Status Former Smoker  . Packs/day: 0.25  . Years: 14.00  . Pack years: 3.50  . Types: Cigarettes  . Start date: 2006  . Quit date: 08/2018  . Years since quitting: 0.7  Smokeless Tobacco Never Used  Tobacco Comment   4-5 cigarettes daily   Counseling given: Not Answered Comment: 4-5 cigarettes daily   Continue to not smoke  Outpatient Encounter Medications as of 05/24/2019  Medication Sig  . albuterol (PROVENTIL) (2.5 MG/3ML) 0.083% nebulizer solution Take 3 mLs (2.5 mg total) by nebulization every 4 (four) hours as needed for up to 30 days for wheezing or shortness of breath. Use twice daily and as needed for coughing  . benzonatate (TESSALON) 100 MG capsule Take 1 capsule (100 mg total) by mouth 3 (three) times daily as needed for cough.  . Budeson-Glycopyrrol-Formoterol (BREZTRI AEROSPHERE) 160-9-4.8 MCG/ACT AERO Inhale 2 puffs into the lungs 2 (two) times daily.  . Budeson-Glycopyrrol-Formoterol (BREZTRI AEROSPHERE) 160-9-4.8 MCG/ACT  AERO Inhale 2 puffs into the lungs 2 (two) times daily.  . cyclobenzaprine (FLEXERIL) 10 MG tablet Take 10 mg by mouth 3 (three) times daily.  Marland Kitchen guaiFENesin (MUCINEX) 600 MG 12 hr tablet Take 1,200 mg by mouth daily.  Marland Kitchen guaiFENesin-codeine 100-10 MG/5ML syrup Take 5 mLs by mouth every 6 (six) hours as needed for cough. (Patient not taking: Reported on 10/29/2018)  . loratadine (CLARITIN) 10 MG tablet Take 1 tablet (10 mg total) by mouth daily. (Patient not taking: Reported on 11/26/2018)  . mometasone-formoterol (DULERA) 200-5 MCG/ACT AERO Inhale 2 puffs into the lungs 2 (two) times daily.  . mometasone-formoterol (DULERA) 200-5 MCG/ACT AERO Inhale 2 puffs into the lungs 2 (two) times a day.  . montelukast (SINGULAIR) 10 MG tablet Take 1 tablet (10 mg total) by mouth at bedtime.  Marland Kitchen oxyCODONE-acetaminophen (PERCOCET) 5-325 MG tablet Take 1 tablet by mouth every 4 (four) hours as needed for severe pain. (Patient not taking: Reported on 10/29/2018)  . predniSONE (DELTASONE) 10 MG tablet Can take 0.5 (5 mg total) to 2  tablets (20mg ) daily as needed based on symptoms   No facility-administered encounter medications on file as of 05/24/2019.     Review of Systems  Review of Systems   Physical Exam  There were no vitals taken for this visit.  Wt Readings from Last 5 Encounters:  11/26/18 169 lb (76.7 kg)  10/29/18 162 lb 12.8 oz (73.8 kg)  10/09/18 165 lb (74.8 kg)  10/09/18 165 lb (74.8 kg)  09/17/18 165 lb (74.8 kg)    BMI Readings from Last 5 Encounters:  11/26/18 23.57 kg/m  10/29/18 22.71 kg/m  10/09/18 23.01 kg/m  10/09/18 23.01 kg/m  09/17/18 23.01 kg/m     Physical Exam    Assessment & Plan:   No problem-specific Assessment & Plan notes found for this encounter.    No follow-ups on file.   09/19/18, NP 05/23/2019   This appointment was *** minutes long with over 50% of the time in direct face-to-face patient care, assessment, plan of care, and follow-up.

## 2019-05-24 ENCOUNTER — Telehealth: Payer: Self-pay | Admitting: Pharmacist

## 2019-05-24 ENCOUNTER — Ambulatory Visit: Payer: Self-pay | Admitting: Pulmonary Disease

## 2019-05-24 NOTE — Telephone Encounter (Signed)
Called patient on 05/24/2019 at 10:40 AM and left HIPAA-compliant VM with instructions to call [facility] back to reschedule appt considering he did not come to appt scheduled today (05/24/2019).  Patient was referred to [facility] team for financial assistance.   Zachery Conch, PharmD PGY2 Ambulatory Care Pharmacy Resident

## 2019-05-24 NOTE — Telephone Encounter (Signed)
Thank you for letting me know.  I am sorry that he did not present for his pharmacy appointment.Elisha Headland, FNP

## 2019-05-28 ENCOUNTER — Ambulatory Visit: Payer: Self-pay | Admitting: Pulmonary Disease

## 2019-06-01 ENCOUNTER — Telehealth: Payer: Self-pay | Admitting: Pulmonary Disease

## 2019-06-01 NOTE — Telephone Encounter (Signed)
06/01/2019 1255  Patient needs to be rescheduled for an in office evaluation.  He continues to be difficult to manage patient's breathing based off of no-shows or inability to follow-up with our office in a timely manner.  Patient does live in [place].  We can work on establishing the patient with a [facility] pulmonologist if he is interested.  I have discussed this with Dr. Isaiah Serge.  It is imperative that the patient keep follow-ups with our office if we are going to be able to manage the patient's breathing appropriately.  Patient has no showed to multiple office visits as well as clinical pharmacy team visits.  Please contact the patient to get him scheduled for a follow-up with Dr. Isaiah Serge or myself.  If he does not keep that appointment then we will need to refer him to a different pulmonologist and he will have to present to his primary care provider for further respiratory management until he establishes with a new pulmonologist.  Elisha Headland, FNP

## 2019-06-01 NOTE — Telephone Encounter (Signed)
-----   Message from Chilton Greathouse, MD sent at 05/25/2019  4:21 PM EST ----- Regarding: RE: No shows Agree. It has been difficult to work with him to provide optimum care. He can get a pulmonary doc closer to where he lives ----- Message ----- From: Coral Ceo, NP Sent: 05/24/2019  11:56 AM EST To: Chilton Greathouse, MD Subject: No shows                                       [place]  This patient continues to no-show to appointments.  Patient typically only completes appointments with their virtual.  We have tried multiple times to reestablish patient's care.  We have even tried to coordinate office visits in our [place] location which patient also no-show to.  Patient continues to only utilize and access our office whenever there is an emergent need such as when he is run out of his medications and been without it for a week.  Despite our attempts to help the patient with access.  Patient no-show today to my office visit as well as the clinical pharmacy team's office visit.At what point should we consider discharging the patient from the practice?  This seems like a waste of resources and time.  I understand the patient is high risk for presenting back to the emergency room.  He continues to not follow through with any of the interventions I have tried such as referring to primary care, referring to community health and wellness, biologic starts, utilizing clinical pharmacy team.Ultimately this decision is up to you.  I believe at this point time it may be best served for the patient to establish care at a different office or one that is closer to him in [place].Arlys John

## 2019-06-08 NOTE — Telephone Encounter (Signed)
Left message for patient to call back  

## 2019-06-29 NOTE — Telephone Encounter (Signed)
Spoke with patient. He stated that his breathing has actually improved. He has been able to return back to work where he has been working 12-14 hour shifts. He has been using the prednisone 2.5mg  (cutting the prednisone 10mg  into 1/4ths) to make them last longer. He used all of the [place] sample that he received back in December and has been using some Dulera that he had on hand.   He is willing to come into the office for a follow up visit. He has been scheduled for 07/02/19 at 330pm with 08/30/19. He is aware that this visit is at the [place] office.   Nothing further needed at time of call.

## 2019-07-01 NOTE — Progress Notes (Signed)
@Patient  ID: Shannon Knapp, male    DOB: 02/23/1986, 34 y.o.   MRN: [id number]  Chief Complaint  Patient presents with  . Follow-up    F/U for asthma. States he has been losing his voice for the past week. Switching between [place]. Also using albuterol inhaler.     Referring provider: No ref. provider found  HPI:  34 year old former smoker followed in our office for severe persistant eosinophillic asthma   PMH: Hypertension Smoker/ Smoking History: Former Smoker.  Quit 2019.  1/2 pack year smoking history. Maintenance:  Breztri, standing prednisone - 10mg   Pt of: Dr. Isaiah Serge  07/02/2019  - Visit   34 year old male former smoker followed in our office for severe persistent eosinophilic asthma.  There continues to be barriers with patient receiving and maintaining his medications.  Patient initially was working at [employer] where he was awaiting getting insurance.  This went on for over a year before getting insurance.  He is now left and gone to a different job.  He is currently cleaning out mattress factories.  He reports there is a lot of dust.  He is presenting to office today for medication refills.  He is asking for samples.  He has previously been managed on [facility] before.  Last office visit he was switched to [facility] due to no ICS/LABA samples in our office.  Patient also has a standing dose of 10 mg of prednisone.  He has the ability to go up to 20 mg if needed.  Due to the fact that he was about to run out of prednisone he has been taking 0.25 tablets.  He has been cutting them in half and then again in order to make the prednisone for last.  He also has not been taking his inhalers regularly he has been spacing these out as needed.  Is also been using his albuterol 4 puffs at a time to help with management of his symptoms.  Patient was last seen in December/2020.  He was requested to have a follow-up in 2 weeks with the clinical pharmacy team as well as with our  office.  He no showed to both of those office visits.  Patient reporting that his breathing is worsening.  He is noticed that he has had increased congestion.  He is hopeful to resume his maintenance medications.  He also needs refills of Singulair has been off of this for a couple of months.  He is also unsure if his daily antihistamine Claritin has been working well as he has been more congested.   Tests:   Imaging CT chest 10/22/2017- bronchial wall thickening, dependent atelectasis in the lower lobe. CT abdomen pelvis 10/27/2017- no acute intra-abdominal process. Chest x-ray 11/28/2017-hyperinflated lungs, no acute abnormality  PFTs 10/30/2017 FVC 5.81 [120%], FEV1 2.38 [61%], F/F 41, TLC 166%, RV/TLC 221%, DLCO 104%, Poor patient effort,  Patient did not want albuterol Moderate obstructive airway disease with overinflation, trapping.  Normal diffusion defect.  FENO  10/17/2017-95 12/02/2017-54  ACQ6 03/16/18- 3  Labs CBC 11/28/2017-WBC 10.1, eos 14%, absolute eosinophil count 1400 IgE 10/22/2017-2209  09/16/2018-SARS-CoV-2-negative  10/29/2018-SARS-CoV-2-negative  10/29/2018-IgE-1550 10/29/2018-CBC with differential-eosinophils relative 1.9, eosinophils absolute 0.1 (patient receiving steroids during this blood work)   FENO:  Lab Results  Component Value Date   NITRICOXIDE 95 10/17/2017    PFT: PFT Results Latest Ref Rng & Units 10/29/2017  FVC-Pre L 5.81  FVC-Predicted Pre % 124  Pre FEV1/FVC % % 41  FEV1-Pre  L 2.38  FEV1-Predicted Pre % 61  DLCO UNC% % 104  DLCO COR %Predicted % 97  TLC L 11.87  TLC % Predicted % 166  RV % Predicted % 360    WALK:  No flowsheet data found.  Imaging: No results found.  Lab Results:  CBC    Component Value Date/Time   WBC 7.1 10/29/2018 1646   RBC 5.02 10/29/2018 1646   HGB 15.5 10/29/2018 1646   HCT 46.8 10/29/2018 1646   PLT 263.0 10/29/2018 1646   MCV 93.3 10/29/2018 1646   MCH 30.8 10/29/2018 0540   MCHC 33.2  10/29/2018 1646   RDW 13.8 10/29/2018 1646   LYMPHSABS 1.6 10/29/2018 1646   MONOABS 0.5 10/29/2018 1646   EOSABS 0.1 10/29/2018 1646   BASOSABS 0.1 10/29/2018 1646    BMET    Component Value Date/Time   NA 139 10/29/2018 0540   K 3.9 10/29/2018 0540   CL 104 10/29/2018 0540   CO2 26 10/29/2018 0540   GLUCOSE 92 10/29/2018 0540   BUN 11 10/29/2018 0540   CREATININE 0.83 10/29/2018 0540   CALCIUM 9.2 10/29/2018 0540   GFRNONAA >60 10/29/2018 0540   GFRAA >60 10/29/2018 0540    BNP No results found for: BNP  ProBNP No results found for: PROBNP  Specialty Problems      Pulmonary Problems   Respiratory distress   Severe persistent acute asthmatic bronchitis   Status asthmaticus   Acute respiratory distress   Acute respiratory failure with hypoxemia (HCC)   Asthma exacerbation   Cough   Hemoptysis   Allergic rhinitis   Upper airway cough syndrome   Severe persistent asthma    PFTs 10/30/2017 FVC 5.81 [120%], FEV1 2.38 [61%], F/F 41, TLC 166%, RV/TLC 221%, DLCO 104%, Poor patient effort,  Patient did not want albuterol Moderate obstructive airway disease with overinflation, trapping.  Normal diffusion defect.  FENO  10/17/2017-95 12/02/2017-54  ACQ6 03/16/18- 3  Labs CBC 11/28/2017-WBC 10.1, eos 14%, absolute eosinophil count 1400 IgE 10/22/2017-2209 10/29/2018-IgE-1550         Allergies  Allergen Reactions  . Amoxicillin Hives    Has patient had a PCN reaction causing immediate rash, facial/tongue/throat swelling, SOB or lightheadedness with hypotension: Yes Has patient had a PCN reaction causing severe rash involving mucus membranes or skin necrosis: No Has patient had a PCN reaction that required hospitalization: No Has patient had a PCN reaction occurring within the last 10 years: Yes If all of the above answers are "NO", then may proceed with Cephalosporin use.  . Fish Allergy Swelling  . Shellfish Allergy Swelling  . Penicillins Hives    Has patient  had a PCN reaction causing immediate rash, facial/tongue/throat swelling, SOB or lightheadedness with hypotension: Yes Has patient had a PCN reaction causing severe rash involving mucus membranes or skin necrosis: No Has patient had a PCN reaction that required hospitalization: No Has patient had a PCN reaction occurring within the last 10 years: Yes If all of the above answers are "NO", then may proceed with Cephalosporin use.    Immunization History  Administered Date(s) Administered  . Pneumococcal Polysaccharide-23 10/19/2017    Past Medical History:  Diagnosis Date  . Asthma   . Chronic bronchitis (HCC)   . COPD (chronic obstructive pulmonary disease) (HCC)     Tobacco History: Social History   Tobacco Use  Smoking Status Former Smoker  . Packs/day: 0.25  . Years: 14.00  . Pack years: 3.50  . Types:  Cigarettes  . Start date: 2006  . Quit date: 08/2018  . Years since quitting: 0.8  Smokeless Tobacco Never Used   Counseling given: Yes   Continue to not smoke  Outpatient Encounter Medications as of 07/02/2019  Medication Sig  . albuterol (PROVENTIL) (2.5 MG/3ML) 0.083% nebulizer solution Take 3 mLs (2.5 mg total) by nebulization every 4 (four) hours as needed for up to 30 days for wheezing or shortness of breath. Use twice daily and as needed for coughing  . Budeson-Glycopyrrol-Formoterol (BREZTRI AEROSPHERE) 160-9-4.8 MCG/ACT AERO Inhale 2 puffs into the lungs 2 (two) times daily.  . montelukast (SINGULAIR) 10 MG tablet Take 1 tablet (10 mg total) by mouth at bedtime.  . predniSONE (DELTASONE) 10 MG tablet Can take 0.5 (5 mg total) to 2 tablets (20mg ) daily as needed based on symptoms  . [DISCONTINUED] mometasone-formoterol (DULERA) 200-5 MCG/ACT AERO Inhale 2 puffs into the lungs 2 (two) times a day.  . [DISCONTINUED] montelukast (SINGULAIR) 10 MG tablet Take 1 tablet (10 mg total) by mouth at bedtime.  . [DISCONTINUED] predniSONE (DELTASONE) 10 MG tablet Can take 0.5  (5 mg total) to 2 tablets (20mg ) daily as needed based on symptoms  . Budeson-Glycopyrrol-Formoterol (BREZTRI AEROSPHERE) 160-9-4.8 MCG/ACT AERO Inhale 2 puffs into the lungs 2 (two) times daily.  . [DISCONTINUED] benzonatate (TESSALON) 100 MG capsule Take 1 capsule (100 mg total) by mouth 3 (three) times daily as needed for cough.  . [DISCONTINUED] cyclobenzaprine (FLEXERIL) 10 MG tablet Take 10 mg by mouth 3 (three) times daily.  . [DISCONTINUED] guaiFENesin (MUCINEX) 600 MG 12 hr tablet Take 1,200 mg by mouth daily.  . [DISCONTINUED] guaiFENesin-codeine 100-10 MG/5ML syrup Take 5 mLs by mouth every 6 (six) hours as needed for cough. (Patient not taking: Reported on 10/29/2018)  . [DISCONTINUED] loratadine (CLARITIN) 10 MG tablet Take 1 tablet (10 mg total) by mouth daily. (Patient not taking: Reported on 11/26/2018)  . [DISCONTINUED] mometasone-formoterol (DULERA) 200-5 MCG/ACT AERO Inhale 2 puffs into the lungs 2 (two) times daily.  . [DISCONTINUED] oxyCODONE-acetaminophen (PERCOCET) 5-325 MG tablet Take 1 tablet by mouth every 4 (four) hours as needed for severe pain. (Patient not taking: Reported on 10/29/2018)   No facility-administered encounter medications on file as of 07/02/2019.     Review of Systems  Review of Systems  Constitutional: Positive for fatigue. Negative for activity change, chills, fever and unexpected weight change.  HENT: Positive for congestion. Negative for postnasal drip, rhinorrhea, sinus pressure, sinus pain and sore throat.   Eyes: Negative.   Respiratory: Positive for cough and shortness of breath. Negative for wheezing.   Cardiovascular: Negative for chest pain and palpitations.  Gastrointestinal: Negative for constipation, diarrhea, nausea and vomiting.  Endocrine: Negative.   Genitourinary: Negative.   Musculoskeletal: Negative.   Skin: Negative.   Neurological: Negative for dizziness and headaches.  Psychiatric/Behavioral: Negative.  Negative for dysphoric  mood. The patient is not nervous/anxious.   All other systems reviewed and are negative.    Physical Exam  BP 120/72 (BP Location: Left Arm, Patient Position: Sitting, Cuff Size: Normal)   Pulse 92   Temp 98.6 F (37 C) (Temporal)   Ht 5\' 11"  (1.803 m)   Wt 165 lb (74.8 kg)   SpO2 99% Comment: on RA  BMI 23.01 kg/m   Wt Readings from Last 5 Encounters:  07/02/19 165 lb (74.8 kg)  11/26/18 169 lb (76.7 kg)  10/29/18 162 lb 12.8 oz (73.8 kg)  10/09/18 165 lb (74.8 kg)  10/09/18 165 lb (74.8 kg)    BMI Readings from Last 5 Encounters:  07/02/19 23.01 kg/m  11/26/18 23.57 kg/m  10/29/18 22.71 kg/m  10/09/18 23.01 kg/m  10/09/18 23.01 kg/m     Physical Exam Vitals and nursing note reviewed.  Constitutional:      General: He is not in acute distress.    Appearance: Normal appearance. He is normal weight.  HENT:     Head: Normocephalic and atraumatic.     Right Ear: Hearing, tympanic membrane, ear canal and external ear normal. There is no impacted cerumen.     Left Ear: Hearing, tympanic membrane, ear canal and external ear normal. There is no impacted cerumen.     Nose: No mucosal edema.     Right Turbinates: Not enlarged.     Left Turbinates: Not enlarged.     Mouth/Throat:     Mouth: Mucous membranes are dry.     Pharynx: Oropharynx is clear. No oropharyngeal exudate.  Eyes:     Pupils: Pupils are equal, round, and reactive to light.  Cardiovascular:     Rate and Rhythm: Normal rate and regular rhythm.     Pulses: Normal pulses.     Heart sounds: Normal heart sounds. No murmur.  Pulmonary:     Effort: Pulmonary effort is normal.     Breath sounds: No decreased breath sounds, wheezing or rales.     Comments: Diminished throughout exam Musculoskeletal:     Cervical back: Normal range of motion.     Right lower leg: No edema.     Left lower leg: No edema.  Lymphadenopathy:     Cervical: No cervical adenopathy.  Skin:    General: Skin is warm and dry.      Capillary Refill: Capillary refill takes less than 2 seconds.     Findings: No erythema or rash.  Neurological:     General: No focal deficit present.     Mental Status: He is alert and oriented to person, place, and time.     Motor: No weakness.     Coordination: Coordination normal.     Gait: Gait is intact. Gait normal.  Psychiatric:        Mood and Affect: Mood normal.        Behavior: Behavior normal. Behavior is cooperative.        Thought Content: Thought content normal.        Judgment: Judgment normal.       Assessment & Plan:   Allergic rhinitis Likely worsened allergic rhinitis given new work environment and dust Symptoms have increased since titrating down on his prednisone Not doing nasal saline rinses  Plan: Start nasal saline rinses daily after work, samples provided today Continue Claritin, okay to switch to a different over-the-counter antihistamine based off patient preference Refill prednisone Refill Singulair  Severe persistent asthma Discussion: Spoke with patient regarding his no-shows to our office as well as repeatedly appointments.  Patient was late to today's office visit.  Overall I am very concerned regarding the patient's continued nonadherence to our treatment plans as well as his inability to follow-up with our office quickly when he is running out of inhalers.  For instance today he has been off of his maintenance inhalers for at least a month.  He has not contacted our office.  Despite multiple times within the last calendar year emphasizing the importance of notifying us if he is running low on maintenance inhalers or if he has difficulty affording  them.  Patient also lives in [place] driving to [place] has been a barrier especially with him working full-time.  Offered referral to [facility] pulmonary or establishment in our [facility] pulmonary office.  Patient would like to remain with our practice but in the [place] office.  This is  already been approved by Dr. Isaiah Serge.  We are in favor as a team in helping the patient over, any barriers to help better manage his asthma.  I emphasized to the patient that he needs to keep his appointments as well as arrive early/on time as there are less appointment slots available in [place] and if he arrives late like he did today he likely will not be seen per our office policy.  Plan: Restart Breztri today  Samples provided today Continue rescue inhaler Restart Singulair Continue daily antihistamine Start nasal saline rinses Establish with our [place] practice in March/2021 - 08/10/19 at 10am    Medication management Medication access continues to be a barrier for the patient Patient continues to not have insurance coverage He has not yet investigated Medicaid coverage or access through an ACO plan Patient was scheduled for clinical pharmacy team visit in January/2021, he no showed to this  Plan: Breztri samples -2 months worth provided for the patient today I explained to the patient that this gives him enough of the timeframe in order to investigate appropriate insurance coverage.  I have encouraged him to apply for Medicaid.       Return in about 6 weeks (around 08/10/2019), or if symptoms worsen or fail to improve, for [facility] - Dr. [name].   Coral Ceo, NP 07/02/2019   This appointment required 42 minutes of patient care (this includes precharting, chart review, review of results, face-to-face care, etc.).

## 2019-07-02 ENCOUNTER — Ambulatory Visit (INDEPENDENT_AMBULATORY_CARE_PROVIDER_SITE_OTHER): Payer: Self-pay | Admitting: Pulmonary Disease

## 2019-07-02 ENCOUNTER — Other Ambulatory Visit: Payer: Self-pay

## 2019-07-02 ENCOUNTER — Encounter: Payer: Self-pay | Admitting: Pulmonary Disease

## 2019-07-02 VITALS — BP 120/72 | HR 92 | Temp 98.6°F | Ht 71.0 in | Wt 165.0 lb

## 2019-07-02 DIAGNOSIS — Z79899 Other long term (current) drug therapy: Secondary | ICD-10-CM

## 2019-07-02 DIAGNOSIS — J301 Allergic rhinitis due to pollen: Secondary | ICD-10-CM

## 2019-07-02 DIAGNOSIS — J4550 Severe persistent asthma, uncomplicated: Secondary | ICD-10-CM

## 2019-07-02 MED ORDER — PREDNISONE 10 MG PO TABS: ORAL_TABLET | ORAL | 1 refills | Status: DC

## 2019-07-02 MED ORDER — BREZTRI AEROSPHERE 160-9-4.8 MCG/ACT IN AERO: 2.0000 | INHALATION_SPRAY | Freq: Two times a day (BID) | RESPIRATORY_TRACT | 0 refills | Status: DC

## 2019-07-02 MED ORDER — MONTELUKAST SODIUM 10 MG PO TABS: 10.0000 mg | ORAL_TABLET | Freq: Every day | ORAL | 12 refills | Status: DC

## 2019-07-02 NOTE — Assessment & Plan Note (Signed)
Discussion: Spoke with patient regarding his no-shows to our office as well as repeatedly appointments.  Patient was late to today's office visit.  Overall I am very concerned regarding the patient's continued nonadherence to our treatment plans as well as his inability to follow-up with our office quickly when he is running out of inhalers.  For instance today he has been off of his maintenance inhalers for at least a month.  He has not contacted our office.  Despite multiple times within the last calendar year emphasizing the importance of notifying us if he is running low on maintenance inhalers or if he has difficulty affording them.  Patient also lives in [place] driving to [place] has been a barrier especially with him working full-time.  Offered referral to [facility] pulmonary or establishment in our [facility] pulmonary office.  Patient would like to remain with our practice but in the [place] office.  This is already been approved by Dr. Isaiah Serge.  We are in favor as a team in helping the patient over, any barriers to help better manage his asthma.  I emphasized to the patient that he needs to keep his appointments as well as arrive early/on time as there are less appointment slots available in [place] and if he arrives late like he did today he likely will not be seen per our office policy.  Plan: Restart Breztri today  Samples provided today Continue rescue inhaler Restart Singulair Continue daily antihistamine Start nasal saline rinses Establish with our [place] practice in March/2021 - 08/10/19 at 10am

## 2019-07-02 NOTE — Patient Instructions (Addendum)
You were seen today by Coral Ceo, NP  for:   1. Severe persistent asthma, unspecified whether complicated  - predniSONE (DELTASONE) 10 MG tablet; Can take 0.5 (5 mg total) to 2 tablets (20mg ) daily as needed based on symptoms  Dispense: 90 tablet; Refill: 1 - montelukast (SINGULAIR) 10 MG tablet; Take 1 tablet (10 mg total) by mouth at bedtime.  Dispense: 30 tablet; Refill: 12  Breztri >>> 2 puffs in the morning right when you wake up, rinse out your mouth after use, 12 hours later 2 puffs, rinse after use >>> Take this daily, no matter what >>> This is not a rescue inhaler    2. Allergic rhinitis due to pollen, unspecified seasonality  Start nasal saline rinses twice daily, samples provided today  Please start taking a daily antihistamine:  >>>choose one of: zyrtec, claritin, allegra, or xyzal  >>>these are over the counter medications  >>>can choose generic option  >>>take daily  >>>this medication helps with allergies, post nasal drip, and cough    3. Medication management  Please investigate your insurance options such as either utilizing a plan from the affordable care act or seen if your employer will extend health insurance coverage  It may be also beneficial to consider applying for Medicaid due to your lack of insurance coverage   We recommend today:   Meds ordered this encounter  Medications  . predniSONE (DELTASONE) 10 MG tablet    Sig: Can take 0.5 (5 mg total) to 2 tablets (20mg ) daily as needed based on symptoms    Dispense:  90 tablet    Refill:  1  . montelukast (SINGULAIR) 10 MG tablet    Sig: Take 1 tablet (10 mg total) by mouth at bedtime.    Dispense:  30 tablet    Refill:  12  . Budeson-Glycopyrrol-Formoterol (BREZTRI AEROSPHERE) 160-9-4.8 MCG/ACT AERO    Sig: Inhale 2 puffs into the lungs 2 (two) times daily.    Dispense:  47.2 g    Refill:  0    Follow Up:    Return in about 6 weeks (around 08/10/2019), or if symptoms worsen or fail to  improve.  We have got you scheduled for a 6-week follow-up on 08/10/2019 at 10 AM with Dr. 08/12/2019 in our [facility] office please arrive to this appointment 15 minutes early.  Please do your part to reduce the spread of COVID-19:      Reduce your risk of any infection  and COVID19 by using the similar precautions used for avoiding the common cold or flu:  [name] Wash your hands often with soap and warm water for at least 20 seconds.  If soap and water are not readily available, use an alcohol-based hand sanitizer with at least 60% alcohol.  . If coughing or sneezing, cover your mouth and nose by coughing or sneezing into the elbow areas of your shirt or coat, into a tissue or into your sleeve (not your hands). [facility] when in public  . Avoid shaking hands with others and consider head nods or verbal greetings only. . Avoid touching your eyes, nose, or mouth with unwashed hands.  . Avoid close contact with people who are sick. . Avoid places or events with large numbers of people in one location, like concerts or sporting events. . If you have some symptoms but not all symptoms, continue to monitor at home and seek medical attention if your symptoms worsen. . If you are having a medical  emergency, call 911.   [place] / e-Visit: [email or web address]         [facility]: [facility]: [phone]                   [facility]: [phone]     It is flu season:   >>> Best ways to protect herself from the flu: Receive the yearly flu vaccine, practice good hand hygiene washing with soap and also using hand sanitizer when available, eat a nutritious meals, get adequate rest, hydrate appropriately   Please contact the office if your symptoms worsen or you have concerns that you are not improving.   Thank you for choosing   [facility] for your healthcare, and for allowing [name] to partner with you on your healthcare journey. I am thankful to be able to provide care to you today.   Wyn Quaker FNP-C

## 2019-07-02 NOTE — Assessment & Plan Note (Signed)
Medication access continues to be a barrier for the patient Patient continues to not have insurance coverage He has not yet investigated Medicaid coverage or access through an ACO plan Patient was scheduled for clinical pharmacy team visit in January/2021, he no showed to this  Plan: Breztri samples -2 months worth provided for the patient today I explained to the patient that this gives him enough of the timeframe in order to investigate appropriate insurance coverage.  I have encouraged him to apply for Medicaid.

## 2019-07-02 NOTE — Assessment & Plan Note (Signed)
Likely worsened allergic rhinitis given new work environment and dust Symptoms have increased since titrating down on his prednisone Not doing nasal saline rinses  Plan: Start nasal saline rinses daily after work, samples provided today Continue Claritin, okay to switch to a different over-the-counter antihistamine based off patient preference Refill prednisone Refill Singulair

## 2019-07-20 ENCOUNTER — Telehealth: Payer: Self-pay | Admitting: Adult Health

## 2019-07-20 DIAGNOSIS — J4550 Severe persistent asthma, uncomplicated: Secondary | ICD-10-CM

## 2019-07-20 MED ORDER — MONTELUKAST SODIUM 10 MG PO TABS: 10.0000 mg | ORAL_TABLET | Freq: Every day | ORAL | 5 refills | Status: DC

## 2019-07-20 NOTE — Telephone Encounter (Signed)
Called in and needs refill of singulair .

## 2019-08-10 ENCOUNTER — Ambulatory Visit: Payer: Self-pay | Admitting: Pulmonary Disease

## 2019-08-11 ENCOUNTER — Other Ambulatory Visit: Payer: Self-pay

## 2019-08-11 ENCOUNTER — Ambulatory Visit (INDEPENDENT_AMBULATORY_CARE_PROVIDER_SITE_OTHER): Payer: Self-pay | Admitting: Pulmonary Disease

## 2019-08-11 ENCOUNTER — Encounter: Payer: Self-pay | Admitting: Pulmonary Disease

## 2019-08-11 VITALS — BP 120/70 | HR 90 | Temp 97.8°F | Ht 71.0 in | Wt 166.2 lb

## 2019-08-11 DIAGNOSIS — J3089 Other allergic rhinitis: Secondary | ICD-10-CM

## 2019-08-11 DIAGNOSIS — Z91199 Patient's noncompliance with other medical treatment and regimen due to unspecified reason: Secondary | ICD-10-CM

## 2019-08-11 DIAGNOSIS — Z9119 Patient's noncompliance with other medical treatment and regimen: Secondary | ICD-10-CM

## 2019-08-11 DIAGNOSIS — J4550 Severe persistent asthma, uncomplicated: Secondary | ICD-10-CM

## 2019-08-11 MED ORDER — PREDNISONE 10 MG PO TABS: ORAL_TABLET | ORAL | 1 refills | Status: DC

## 2019-08-11 MED ORDER — MONTELUKAST SODIUM 10 MG PO TABS: 10.0000 mg | ORAL_TABLET | Freq: Every day | ORAL | 5 refills | Status: DC

## 2019-08-11 MED ORDER — ALBUTEROL SULFATE HFA 108 (90 BASE) MCG/ACT IN AERS: 2.0000 | INHALATION_SPRAY | Freq: Four times a day (QID) | RESPIRATORY_TRACT | 3 refills | Status: DC | PRN

## 2019-08-11 NOTE — Progress Notes (Signed)
Subjective:    Patient ID: Shannon Knapp, male    DOB: 1985/10/13, 34 y.o.   MRN: [id number]  HPI This is a 34 year old former smoker (quit 2019) who presents for follow-up of severe persistent asthma.  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  He has been following with Dr. Vaughan Browner at our [facility] office however wants to transfer care here to the [facility] office as it is closer to his home.  The patient notes that he is out of Singulair, albuterol, Breztri and prednisone.  He was switched to [facility] previously.  He actually admits that he has been only using the [place] once a day trying to "stretch it".  He has noted increased sinus congestion and dyspnea over the last several weeks.  He attributes this to the "pollen".  He is also working in a [employer] and is exposed to dust though he does try to wear a filter mask.  He has not had any fevers, chills or sweats.  No hemoptysis.  He has had chest tightness but no pain.  No orthopnea or paroxysmal nocturnal dyspnea.  No lower extremity edema.  He continues to be abstinent of tobacco products.  Laboratory data thus far: Imaging CT chest 10/22/2017- bronchial wall thickening, dependent atelectasis in the lower lobe. CT abdomen pelvis 10/27/2017- no acute intra-abdominal process. Chest x-ray 11/28/2017-hyperinflated lungs, no acute abnormality  PFTs 10/30/2017 FVC 5.81 [120%], FEV1 2.38 [61%], F/F 41, TLC 166%, RV/TLC 221%, DLCO 104%, Poor patient effort,  Patient did not want albuterol Moderate obstructive airway disease with overinflation, trapping.  Normal diffusion defect.  FENO  10/17/2017-95 12/02/2017-54   Labs CBC 11/28/2017-WBC 10.1, eos 14%, absolute eosinophil count 1400 CBC 10/29/2018-WBC 7.1, eos 1.9%, absolute eos 100 (patient on steroids) IgE 10/22/2017-2209 IgE 10/29/2018-1550    Review of Systems A 10 point review of systems was performed and it is as noted  above otherwise negative.    Objective:   Physical Exam BP 120/70 (BP Location: Left Arm, Cuff Size: Normal)   Pulse 90   Temp 97.8 F (36.6 C) (Temporal)   Ht 5\' 11"  (1.803 m)   Wt 166 lb 3.2 oz (75.4 kg)   SpO2 98%   BMI 23.18 kg/m  GENERAL: Well-developed, well-nourished.  No acute respiratory distress. HEAD: Normocephalic, atraumatic.  EYES: Pupils equal, round, reactive to light.  No scleral icterus.  MOUTH: Nose/mouth/throat not examined due to masking requirements for COVID 19. NECK: Supple. No thyromegaly. No nodules. No JVD.  Trachea midline PULMONARY: Lungs clear to auscultation bilaterally.  No adventitious sounds. CARDIOVASCULAR: S1 and S2. Regular rate and rhythm.  No rubs murmurs or gallops heard. GASTROINTESTINAL: Abdomen is soft, nondistended. MUSCULOSKELETAL: No joint deformity, no clubbing, no edema.  NEUROLOGIC: Awake and alert, no focal deficits.  Speech is fluent. SKIN: Intact,warm,dry.  Limited exam shows no rashes. PSYCH: Mood and behavior normal.    Assessment & Plan:   Severe persistent asthma Appears to be mostly IgE mediated Provided patient with Trelegy Ellipta 200 mcg 1 puff daily, patient instructed on proper use Refilled Singulair, 10 mg daily Refilled prednisone, goal will be to try to limit prednisone use as much as possible Refilled albuterol rescue inhaler Referred patient for medication assistance Investigate resources with Xolair  Perennial allergic rhinitis Part of disease constellation Singulair as above Nasal hygiene  Problems with medical care compliance Referral to medication assistance program (MAP) Referral to [place] resources as above  Patient received 2 months worth of Trelegy Ellipta We will have medication assistance help with obtaining Trelegy for the patient  We will see the patient in follow-up in 2 months time he is to contact us prior to that time should any new  difficulties arise.  Gailen Shelter, MD [place] [facility]  *This note was dictated using voice recognition software/Dragon.  Despite best efforts to proofread, errors can occur which can change the meaning.  Any change was purely unintentional.

## 2019-08-11 NOTE — Patient Instructions (Signed)
We have sent all your prescriptions to [facility].  You have been provided Trelegy Ellipta 200 mcg, 1 inhalation daily  We will refer you to medication assistance program (MAP)  I recommend you look into the [name] this is located in [address] in [place] phone number is [phone].  Can get a primary physician there as well as assistance through their pharmacy services.  We will start investigating the Xolair shot for you this is an asthma shot.  Let me see you back in 2 months time call sooner should any new problems arise.

## 2019-08-25 NOTE — Progress Notes (Signed)
Agree with the above.  Patient has been seen in follow-up.  Gailen Shelter, MD [place] [facility]

## 2019-10-19 ENCOUNTER — Other Ambulatory Visit: Payer: Self-pay

## 2019-10-19 ENCOUNTER — Telehealth: Payer: Self-pay | Admitting: Pulmonary Disease

## 2019-10-19 ENCOUNTER — Encounter: Payer: Self-pay | Admitting: Adult Health

## 2019-10-19 NOTE — Telephone Encounter (Signed)
Called and spoke with patient letting him know that right now we have no sooner openings for him to be seen other than his Thursday appointment. Patient states that his voice is raspy and he has some shortness of breath for about a month. Was switched to Trelegy. Informed patient that unless someone cancels we have nothing open for him. Patient said he is going to call [place] office to see if he can be seen sooner. Nothing further needed at this time.

## 2019-10-19 NOTE — Progress Notes (Signed)
Patient was called total of 5 times by both NP and RN. Phone rang then switched over to busy tone. Patient will have to reschedule televisit by calling office and providing correct phone number.

## 2019-10-20 ENCOUNTER — Ambulatory Visit (INDEPENDENT_AMBULATORY_CARE_PROVIDER_SITE_OTHER): Payer: Self-pay | Admitting: Adult Health

## 2019-10-20 ENCOUNTER — Ambulatory Visit (INDEPENDENT_AMBULATORY_CARE_PROVIDER_SITE_OTHER): Payer: Self-pay

## 2019-10-20 ENCOUNTER — Encounter: Payer: Self-pay | Admitting: Adult Health

## 2019-10-20 VITALS — BP 126/90 | HR 103 | Temp 98.5°F | Ht 71.0 in | Wt 165.4 lb

## 2019-10-20 DIAGNOSIS — J4551 Severe persistent asthma with (acute) exacerbation: Secondary | ICD-10-CM

## 2019-10-20 DIAGNOSIS — J4550 Severe persistent asthma, uncomplicated: Secondary | ICD-10-CM

## 2019-10-20 MED ORDER — DOXYCYCLINE HYCLATE 100 MG PO TABS: 100.0000 mg | ORAL_TABLET | Freq: Two times a day (BID) | ORAL | 0 refills | Status: DC

## 2019-10-20 MED ORDER — TRELEGY ELLIPTA 200-62.5-25 MCG/INH IN AEPB: 1.0000 | INHALATION_SPRAY | Freq: Every day | RESPIRATORY_TRACT | 0 refills | Status: DC

## 2019-10-20 NOTE — Progress Notes (Signed)
@Patient  ID: , male    DOB: 12-23-1985, 34 y.o.   MRN: [id number]  Chief Complaint  Patient presents with  . Follow-up    Referring provider: No ref. provider found  HPI: 34 year old male former smoker followed for for severe persistent asthma with elevated IgE and eosinophils He is prone to recurrent exacerbations with hospitalizations    TEST/EVENTS :  Hospitalized twice in 2019 for status asthmaticus.  Had bronchoscopy in June 2019 for intermittent hemoptysis without acute abnormality.  CT chest 10/22/2017- bronchial wall thickening, dependent atelectasis in the lower lobe. CT abdomen pelvis 10/27/2017- no acute intra-abdominal process. Chest x-ray 11/28/2017-hyperinflated lungs, no acute abnormality I have reviewed the images personally.  PFTs 10/30/2017 FVC 5.81 [120%], FEV1 2.38 [61%], F/F 41, TLC 166%, RV/TLC 221%, DLCO 104%, Poor patient effort,  Patient did not want albuterol Moderate obstructive airway disease with overinflation, trapping.  Normal diffusion defect.  FENO  10/17/2017-95 12/02/2017-54  ACQ6 03/16/18- 3  Labs CBC 11/28/2017-WBC 10.1, eos 14%, absolute eosinophil count 1400 IgE 10/22/2017-2209  Pets: No pets, birds, farm animals Occupation: Works as a 12/22/2017 for [employer], currently unemployed.  Exposures: No known exposures, no mold, hot tubs Smoking history: 5-pack-year smoking history.  Smokes marijuana occasionally very proud of you Travel history: Lived in [place] for 10 years.  No other recent travel Relevant family history: No significant family history of lung disease.   10/21/2019 Follow up : Asthma Patient presents for a a follow-up visit for his asthma.  Patient says his asthma has not been doing well over the last 2 months.  He continues to have increased cough wheezing and shortness of breath.  Unfortunately patient does not have any insurance and is unemployed.  He depends on samples inhalers.  Patient says he has  shortness of breath and is unable to work due to this.  Patient says over the last couple weeks has had increased cough, congestion thick mucus wheezing and shortness of breath.  He denies any hemoptysis chest pain orthopnea PND or increased leg swelling. He recently has had Trelegy inhaler which he says helped some.  However his inhaler is out now.  He also does not have a primary care provider.  He says he does have an albuterol inhaler and nebulizer.  Patient denies any loss of taste or smell.  No fever no body aches.  Has not had Covid vaccine and is not going to get it.  Patient education given  Allergies  Allergen Reactions  . Amoxicillin Hives    Has patient had a PCN reaction causing immediate rash, facial/tongue/throat swelling, SOB or lightheadedness with hypotension: Yes Has patient had a PCN reaction causing severe rash involving mucus membranes or skin necrosis: No Has patient had a PCN reaction that required hospitalization: No Has patient had a PCN reaction occurring within the last 10 years: Yes If all of the above answers are "NO", then may proceed with Cephalosporin use.  . Fish Allergy Swelling  . Shellfish Allergy Swelling  . Penicillins Hives    Has patient had a PCN reaction causing immediate rash, facial/tongue/throat swelling, SOB or lightheadedness with hypotension: Yes Has patient had a PCN reaction causing severe rash involving mucus membranes or skin necrosis: No Has patient had a PCN reaction that required hospitalization: No Has patient had a PCN reaction occurring within the last 10 years: Yes If all of the above answers are "NO", then may proceed with Cephalosporin use.    Immunization History  Administered Date(s) Administered  . Pneumococcal Polysaccharide-23 10/19/2017    Past Medical History:  Diagnosis Date  . Asthma   . Chronic bronchitis (HCC)   . COPD (chronic obstructive pulmonary disease) (HCC)     Tobacco History: Social History    Tobacco Use  Smoking Status Former Smoker  . Packs/day: 0.25  . Years: 14.00  . Pack years: 3.50  . Types: Cigarettes  . Start date: 2006  . Quit date: 08/2018  . Years since quitting: 1.1  Smokeless Tobacco Never Used   Counseling given: Not Answered   Outpatient Medications Prior to Visit  Medication Sig Dispense Refill  . albuterol (VENTOLIN HFA) 108 (90 Base) MCG/ACT inhaler Inhale 2 puffs into the lungs every 6 (six) hours as needed for wheezing or shortness of breath. 18 g 3  . montelukast (SINGULAIR) 10 MG tablet Take 1 tablet (10 mg total) by mouth at bedtime. 30 tablet 5  . predniSONE (DELTASONE) 10 MG tablet Can take 0.5 (5 mg total) to 2 tablets (20mg ) daily as needed based on symptoms 90 tablet 1  . albuterol (PROVENTIL) (2.5 MG/3ML) 0.083% nebulizer solution Take 3 mLs (2.5 mg total) by nebulization every 4 (four) hours as needed for up to 30 days for wheezing or shortness of breath. Use twice daily and as needed for coughing 75 mL 2   No facility-administered medications prior to visit.     Review of Systems:   Constitutional:   No  weight loss, night sweats,  Fevers, chills,  +fatigue, or  lassitude.  HEENT:   No headaches,  Difficulty swallowing,  Tooth/dental problems, or  Sore throat,                No sneezing, itching, ear ache, + nasal congestion, post nasal drip,   CV:  No chest pain,  Orthopnea, PND, swelling in lower extremities, anasarca, dizziness, palpitations, syncope.   GI  No heartburn, indigestion, abdominal pain, nausea, vomiting, diarrhea, change in bowel habits, loss of appetite, bloody stools.   Resp:  No chest wall deformity  Skin: no rash or lesions.  GU: no dysuria, change in color of urine, no urgency or frequency.  No flank pain, no hematuria   MS:  No joint pain or swelling.  No decreased range of motion.  No back pain.    Physical Exam  BP 126/90 (BP Location: Left Arm, Cuff Size: Normal)   Pulse (!) 103   Temp 98.5 F  (36.9 C) (Oral)   Ht 5\' 11"  (1.803 m)   Wt 165 lb 6.4 oz (75 kg)   SpO2 97%   BMI 23.07 kg/m   GEN: A/Ox3; pleasant , NAD, well nourished    HEENT:  [place]/AT,   NOSE-clear, THROAT-clear, no lesions, no postnasal drip or exudate noted.   NECK:  Supple w/ fair ROM; no JVD; normal carotid impulses w/o bruits; no thyromegaly or nodules palpated; no lymphadenopathy.   No stridor noted  RESP positive wheezing on forced expiration.  Speaks in full sentences.. no accessory muscle use, no dullness to percussion  CARD:  RRR, no m/r/g, no peripheral edema, pulses intact, no cyanosis or clubbing.  GI:   Soft & nt; nml bowel sounds; no organomegaly or masses detected.   Musco: Warm bil, no deformities or joint swelling noted.   Neuro: alert, no focal deficits noted.    Skin: Warm, no lesions or rashes    Lab Results:  CBC    Component Value Date/Time   WBC  7.1 10/29/2018 1646   RBC 5.02 10/29/2018 1646   HGB 15.5 10/29/2018 1646   HCT 46.8 10/29/2018 1646   PLT 263.0 10/29/2018 1646   MCV 93.3 10/29/2018 1646   MCH 30.8 10/29/2018 0540   MCHC 33.2 10/29/2018 1646   RDW 13.8 10/29/2018 1646   LYMPHSABS 1.6 10/29/2018 1646   MONOABS 0.5 10/29/2018 1646   EOSABS 0.1 10/29/2018 1646   BASOSABS 0.1 10/29/2018 1646    BMET    Component Value Date/Time   NA 139 10/29/2018 0540   K 3.9 10/29/2018 0540   CL 104 10/29/2018 0540   CO2 26 10/29/2018 0540   GLUCOSE 92 10/29/2018 0540   BUN 11 10/29/2018 0540   CREATININE 0.83 10/29/2018 0540   CALCIUM 9.2 10/29/2018 0540   GFRNONAA >60 10/29/2018 0540   GFRAA >60 10/29/2018 0540    BNP No results found for: BNP  ProBNP No results found for: PROBNP  Imaging: No results found.    PFT Results Latest Ref Rng & Units 10/29/2017  FVC-Pre L 5.81  FVC-Predicted Pre % 124  Pre FEV1/FVC % % 41  FEV1-Pre L 2.38  FEV1-Predicted Pre % 61  DLCO UNC% % 104  DLCO COR %Predicted % 97  TLC L 11.87  TLC % Predicted % 166  RV %  Predicted % 360    Lab Results  Component Value Date   NITRICOXIDE 95 10/17/2017        Assessment & Plan:   No problem-specific Assessment & Plan notes found for this encounter.     Rexene Edison, NP 10/20/2019

## 2019-10-20 NOTE — Patient Instructions (Addendum)
Doxycycline 100mg  Twice daily  For 1 week  -take with food Tiburcio Pea is the cheapest ~$10 ) Wear sunscreen  Mucinex DM Twice daily  As needed  Cough /congestion  Continue on Prednisone 10mg  daily  Restart TRELEGY 1 puff daily  Patient assistance for TRELEGY  Albuterol inhaler or Neb As needed   Refer to Waldo Laine.  Chest xray today  COVID 19 testing -please quarantine at home until test results are back .  Download Good Rx APP - can find medications at cheaper prices. Asthma action plan as discussed .   Follow up with Dr. in 2 weeks or APP [facility] or [place] ) and As needed   Please contact office for sooner follow up if symptoms do not improve or worsen or seek emergency care

## 2019-10-21 ENCOUNTER — Ambulatory Visit: Payer: Self-pay | Admitting: Pulmonary Disease

## 2019-10-21 NOTE — Assessment & Plan Note (Signed)
Difficult to control asthma due to multiple issues including lack of insurance and income. Advised patient to download good Rx app so we can help with prescription cost.  Also will refer to community health and wellness.  Have also suggested that he look into community resources such as Medicaid to see if he qualifies.  We will fill out patient assistance paperwork for Trelegy.  Samples were given. Patient has an acute respiratory flare currently.  Will need to rule out COVID-19.  Advised to quarantine until test results are returned.  Check chest x-ray today.  Treat with course of antibiotics and steroids.  Plan  Patient Instructions  Doxycycline 100mg  Twice daily  For 1 week  -take with food is the cheapest ~$10 ) Wear sunscreen  Mucinex DM Twice daily  As needed  Cough /congestion  Continue on Prednisone 10mg  daily  Restart TRELEGY 1 puff daily  Patient assistance for TRELEGY  Albuterol inhaler or Neb As needed   Refer to Karin Golden.  Chest xray today  COVID 19 testing -please quarantine at home until test results are back .  Download Good Rx APP - can find medications at cheaper prices. Asthma action plan as discussed .   Follow up with Dr. in 2 weeks or APP [facility] or [place] ) and As needed   Please contact office for sooner follow up if symptoms do not improve or worsen or seek emergency care

## 2019-10-21 NOTE — Assessment & Plan Note (Signed)
Flare   Plan  Patient Instructions  Doxycycline 100mg  Twice daily  For 1 week  -take with food is the cheapest ~$10 ) Wear sunscreen  Mucinex DM Twice daily  As needed  Cough /congestion  Continue on Prednisone 10mg  daily  Restart TRELEGY 1 puff daily  Patient assistance for TRELEGY  Albuterol inhaler or Neb As needed   Refer to Karin Golden.  Chest xray today  COVID 19 testing -please quarantine at home until test results are back .  Download Good Rx APP - can find medications at cheaper prices. Asthma action plan as discussed .   Follow up with Dr. in 2 weeks or APP [facility] or [place] ) and As needed   Please contact office for sooner follow up if symptoms do not improve or worsen or seek emergency care

## 2019-10-22 ENCOUNTER — Emergency Department: Payer: Self-pay

## 2019-10-22 ENCOUNTER — Other Ambulatory Visit: Payer: Self-pay

## 2019-10-22 ENCOUNTER — Emergency Department
Admission: EM | Admit: 2019-10-22 | Discharge: 2019-10-22 | Disposition: A | Discharge status: Home / Self Care | Payer: Self-pay | Attending: Emergency Medicine | Admitting: Emergency Medicine

## 2019-10-22 DIAGNOSIS — Z20822 Contact with and (suspected) exposure to covid-19: Secondary | ICD-10-CM | POA: Insufficient documentation

## 2019-10-22 DIAGNOSIS — B370 Candidal stomatitis: Secondary | ICD-10-CM | POA: Insufficient documentation

## 2019-10-22 DIAGNOSIS — Z79899 Other long term (current) drug therapy: Secondary | ICD-10-CM | POA: Insufficient documentation

## 2019-10-22 DIAGNOSIS — J45909 Unspecified asthma, uncomplicated: Secondary | ICD-10-CM | POA: Insufficient documentation

## 2019-10-22 DIAGNOSIS — J189 Pneumonia, unspecified organism: Secondary | ICD-10-CM | POA: Insufficient documentation

## 2019-10-22 DIAGNOSIS — Z87891 Personal history of nicotine dependence: Secondary | ICD-10-CM | POA: Insufficient documentation

## 2019-10-22 LAB — BASIC METABOLIC PANEL
Anion gap: 11 (ref 5–15)
BUN: 9 mg/dL (ref 6–20)
CO2: 26 mmol/L (ref 22–32)
Calcium: 9.1 mg/dL (ref 8.9–10.3)
Chloride: 101 mmol/L (ref 98–111)
Creatinine, Ser: 0.93 mg/dL (ref 0.61–1.24)
GFR calc Af Amer: >60 mL/min (ref >60)
GFR calc non Af Amer: >60 mL/min (ref >60)
Glucose, Bld: 95 mg/dL (ref 70–99)
Potassium: 3.3 mmol/L — ABNORMAL LOW (ref 3.5–5.1)
Sodium: 138 mmol/L (ref 135–145)

## 2019-10-22 LAB — CBC
HCT: 47.6 % (ref 39.0–52.0)
Hemoglobin: 16.4 g/dL (ref 13.0–17.0)
MCH: 31.8 pg (ref 26.0–34.0)
MCHC: 34.5 g/dL (ref 30.0–36.0)
MCV: 92.2 fL (ref 80.0–100.0)
Platelets: 297 10*3/uL (ref 150–400)
RBC: 5.16 MIL/uL (ref 4.22–5.81)
RDW: 13.3 % (ref 11.5–15.5)
WBC: 11.9 10*3/uL — ABNORMAL HIGH (ref 4.0–10.5)
nRBC: 0 % (ref 0.0–0.2)

## 2019-10-22 LAB — SARS CORONAVIRUS 2 BY RT PCR (HOSPITAL ORDER, PERFORMED IN ~~LOC~~ HOSPITAL LAB): SARS Coronavirus 2: NEGATIVE

## 2019-10-22 MED ORDER — CLOTRIMAZOLE 10 MG MT TROC: 10.0000 mg | Freq: Every day | OROMUCOSAL | 0 refills | Status: DC

## 2019-10-22 MED ORDER — AZITHROMYCIN 500 MG PO TABS
500.0000 mg | ORAL_TABLET | Freq: Once | ORAL | Status: AC
  Administered 2019-10-22: 500 mg via ORAL
  Filled 2019-10-22: qty 1

## 2019-10-22 MED ORDER — ALBUTEROL SULFATE HFA 108 (90 BASE) MCG/ACT IN AERS: 2.0000 | INHALATION_SPRAY | Freq: Four times a day (QID) | RESPIRATORY_TRACT | 0 refills | Status: DC | PRN

## 2019-10-22 MED ORDER — CLOTRIMAZOLE 10 MG MT TROC: 10.0000 mg | Freq: Every day | OROMUCOSAL | 0 refills | Status: AC

## 2019-10-22 MED ORDER — AZITHROMYCIN 250 MG PO TABS: 250.0000 mg | ORAL_TABLET | Freq: Every day | ORAL | 0 refills | Status: DC

## 2019-10-22 NOTE — ED Triage Notes (Signed)
Pt c/o cough with congestion, SOB for the past 2 months when he switched his inhalers, states he is currently on abx also for the cough. Pt is in NAD. Pt sounds congested.Marland Kitchen

## 2019-10-22 NOTE — ED Provider Notes (Signed)
[facility] Emergency Department Provider Note ____________________________________________  Time seen: 1456  I have reviewed the triage vital signs and the nursing notes.  HISTORY  Chief Complaint  Shortness of Breath  HPI Shannon Knapp is a 34 y.o. male presents himself to the ED for evaluation of ongoing shortness of breath.  Moderate persistent asthma, presents with shortness of breath that he describes been present for the last 2 months.  He has had his steroid inhalers which by his primary pulmonologist several times over that timeframe.  He is also currently on doxycycline twice daily for his cough.  He presents today describing severe shortness of breath last night while attempting sleep.  He denies any interim fever, chills, sweats per he also does not endorse some cough induced vomiting.  Patient was tested for Covid last month and reports a negative result at that time.   Past Medical History:  Diagnosis Date  . Asthma   . Chronic bronchitis ([place])   . COPD (chronic obstructive pulmonary disease) [facility])     Patient Active Problem List   Diagnosis Date Noted  . Medication management 04/30/2019  . Healthcare maintenance 11/26/2018  . Severe persistent asthma 05/28/2018  . Acute febrile illness 04/10/2018  . Upper airway cough syndrome   . Hypoxemia   . Syncope   . Allergic rhinitis   . Cough   . Hemoptysis   . Protein-calorie malnutrition, severe 10/20/2017  . Acute respiratory failure with hypoxemia ([place]) 10/20/2017  . Acute respiratory distress   . Asthma exacerbation   . Status asthmaticus 10/18/2017  . Severe persistent acute asthmatic bronchitis 10/17/2017  . Elevated blood pressure reading without diagnosis of hypertension 10/17/2017  . Respiratory distress 10/17/2017    Past Surgical History:  Procedure Laterality Date  . VIDEO BRONCHOSCOPY Bilateral 10/24/2017   Procedure: VIDEO BRONCHOSCOPY WITHOUT FLUORO;  Surgeon: Rush Farmer, MD;  Location: [facility] ENDOSCOPY;  Service: Cardiopulmonary;  Laterality: Bilateral;    Prior to Admission medications   Medication Sig Start Date End Date Taking? Authorizing Provider  albuterol (PROVENTIL) (2.5 MG/3ML) 0.083% nebulizer solution Take 3 mLs (2.5 mg total) by nebulization every 4 (four) hours as needed for up to 30 days for wheezing or shortness of breath. Use twice daily and as needed for coughing 10/29/18 08/11/19  Carrie Mew, MD  albuterol (VENTOLIN HFA) 108 (90 Base) MCG/ACT inhaler Inhale 2 puffs into the lungs every 6 (six) hours as needed for wheezing or shortness of breath. 08/11/19   Tyler Pita, MD  albuterol (VENTOLIN HFA) 108 (90 Base) MCG/ACT inhaler Inhale 2 puffs into the lungs every 6 (six) hours as needed for shortness of breath. 10/22/19   Evanie Buckle, Dannielle Karvonen, PA-C  azithromycin (ZITHROMAX Z-PAK) 250 MG tablet Take 1 tablet (250 mg total) by mouth daily for 4 days. 10/23/19 10/27/19  Angie Piercey, Dannielle Karvonen, PA-C  clotrimazole (MYCELEX) 10 MG troche Take 1 tablet (10 mg total) by mouth 5 (five) times daily for 7 days. 10/22/19 10/29/19  Kaj Vasil, Dannielle Karvonen, PA-C  doxycycline (VIBRA-TABS) 100 MG tablet Take 1 tablet (100 mg total) by mouth 2 (two) times daily. 10/20/19   Parrett, Fonnie Mu, NP  Fluticasone-Umeclidin-Vilant (TRELEGY ELLIPTA) 200-62.5-25 MCG/INH AEPB Inhale 1 puff into the lungs daily. 10/20/19   Parrett, Fonnie Mu, NP  Fluticasone-Umeclidin-Vilant (TRELEGY ELLIPTA) 200-62.5-25 MCG/INH AEPB Inhale 1 puff into the lungs daily. 10/20/19   Parrett, Fonnie Mu, NP  montelukast (SINGULAIR) 10 MG tablet Take 1 tablet (  10 mg total) by mouth at bedtime. 08/11/19   Salena Saner, MD  predniSONE (DELTASONE) 10 MG tablet Can take 0.5 (5 mg total) to 2 tablets (20mg ) daily as needed based on symptoms 08/11/19   08/13/19, MD    Allergies Amoxicillin, Fish allergy, Shellfish allergy, and Penicillins  Family History  Problem Relation Age of Onset   . Hypertension Mother   . Asthma Father   . Hypertension Father     Social History Social History   Tobacco Use  . Smoking status: Former Smoker    Packs/day: 0.25    Years: 14.00    Pack years: 3.50    Types: Cigarettes    Start date: 2006    Quit date: 08/2018    Years since quitting: 1.1  . Smokeless tobacco: Never Used  Substance Use Topics  . Alcohol use: Yes    Alcohol/week: 15.0 standard drinks    Types: 15 Cans of beer per week  . Drug use: No    Review of Systems  Constitutional: Negative for fever. Eyes: Negative for visual changes. ENT: Negative for sore throat. Cardiovascular: Negative for chest pain. Respiratory: Positive for shortness of breath cough, congestion. Gastrointestinal: Negative for abdominal pain, vomiting and diarrhea. Genitourinary: Negative for dysuria. Musculoskeletal: Negative for back pain. Skin: Negative for rash. Neurological: Negative for headaches, focal weakness or numbness. ____________________________________________  PHYSICAL EXAM:  VITAL SIGNS: ED Triage Vitals  Enc Vitals Group     BP 10/22/19 1220 (!) 154/87     Pulse Rate 10/22/19 1220 94     Resp 10/22/19 1220 19     Temp 10/22/19 1220 99.6 F (37.6 C)     Temp Source 10/22/19 1220 Oral     SpO2 10/22/19 1220 96 %     Weight --      Height --      Head Circumference --      Peak Flow --      Pain Score 10/22/19 1222 6     Pain Loc --      Pain Edu? --      Excl. in GC? --     Constitutional: Alert and oriented. Well appearing and in no distress. Head: Normocephalic and atraumatic. Eyes: Conjunctivae are normal. Normal extraocular movements Mouth/Throat: Mucous membranes are moist.  White patchy exudates to the hard palate and oropharynx consistent with thrush. Cardiovascular: Normal rate, regular rhythm. Normal distal pulses. Respiratory: Normal respiratory effort. No rales/rhonchi.  Bilateral wheezes noted. Gastrointestinal: Soft and nontender. No  distention. Musculoskeletal: Nontender with normal range of motion in all extremities.  Neurologic:  Normal gait without ataxia. Normal speech and language. No gross focal neurologic deficits are appreciated. Skin:  Skin is warm, dry and intact. No rash noted. Psychiatric: Mood and affect are normal. Patient exhibits appropriate insight and judgment. ____________________________________________   LABS (pertinent positives/negatives)  Labs Reviewed  CBC - Abnormal; Notable for the following components:      Result Value   WBC 11.9 (*)    All other components within normal limits  BASIC METABOLIC PANEL - Abnormal; Notable for the following components:   Potassium 3.3 (*)    All other components within normal limits  SARS CORONAVIRUS 2 BY RT PCR (HOSPITAL ORDER, PERFORMED IN [facility])   ____________________________________________   RADIOLOGY  CXR  IMPRESSION: New left basilar pneumonia. ____________________________________________  PROCEDURES  Azithromycin 500 mg PO  Procedures ____________________________________________  INITIAL IMPRESSION / ASSESSMENT AND PLAN / ED  COURSE  Patient with ED evaluation of ongoing cough, chest tightness, shortness of breath.  Patient with history of mild severe asthma, presents for evaluation.  Chest x-ray reveals a new left basilar pneumonia when compared to x-rays from 2 days prior.  Patient will be treated with azithromycin, and prescription refills for his albuterol inhaler provided.  He also has what appears to be some oral candidiasis from his inhaled powder steroid.  Chlortrimazole troches will be prescribed.  Patient will follow up with his pulmonologist and return to the ED as needed.  Covid test is pending at the time of discharge.  Taysean ZAVON HYSON was evaluated in Emergency Department on 10/22/2019 for the symptoms described in the history of present illness. He was evaluated in the context of the global COVID-19  pandemic, which necessitated consideration that the patient might be at risk for infection with the SARS-CoV-2 virus that causes COVID-19. Institutional protocols and algorithms that pertain to the evaluation of patients at risk for COVID-19 are in a state of rapid change based on information released by regulatory bodies including the CDC and federal and state organizations. These policies and algorithms were followed during the patient's care in the ED. ____________________________________________  FINAL CLINICAL IMPRESSION(S) / ED DIAGNOSES  Final diagnoses:  Community acquired pneumonia of left lower lobe of lung  Oral candidiasis      Karmen Stabs, Charlesetta Ivory, PA-C 10/22/19 1627    Jene Every, MD 10/22/19 [id number]

## 2019-10-22 NOTE — Discharge Instructions (Addendum)
You are being treated for a new pneumonia noted on your CXR. Take the azithromycin along with your previously prescribed doxycycline. Take the inhaler and nebulizers as prescribed. Follow-up with your pulmonologist as planned. Return to the ED as needed.

## 2019-10-25 ENCOUNTER — Emergency Department (HOSPITAL_COMMUNITY): Payer: Self-pay

## 2019-10-25 ENCOUNTER — Other Ambulatory Visit: Payer: Self-pay

## 2019-10-25 ENCOUNTER — Inpatient Hospital Stay (HOSPITAL_COMMUNITY)
Admission: EM | Admit: 2019-10-25 | Discharge: 2019-10-27 | DRG: 194 | Disposition: A | Discharge status: Home / Self Care | Payer: Self-pay | Attending: Family Medicine | Admitting: Family Medicine

## 2019-10-25 ENCOUNTER — Encounter (HOSPITAL_COMMUNITY): Payer: Self-pay

## 2019-10-25 ENCOUNTER — Encounter: Payer: Self-pay | Admitting: *Deleted

## 2019-10-25 DIAGNOSIS — J4550 Severe persistent asthma, uncomplicated: Secondary | ICD-10-CM | POA: Diagnosis present

## 2019-10-25 DIAGNOSIS — T380X5A Adverse effect of glucocorticoids and synthetic analogues, initial encounter: Secondary | ICD-10-CM | POA: Diagnosis present

## 2019-10-25 DIAGNOSIS — J189 Pneumonia, unspecified organism: Principal | ICD-10-CM | POA: Diagnosis present

## 2019-10-25 DIAGNOSIS — Z8249 Family history of ischemic heart disease and other diseases of the circulatory system: Secondary | ICD-10-CM

## 2019-10-25 DIAGNOSIS — J45901 Unspecified asthma with (acute) exacerbation: Secondary | ICD-10-CM | POA: Diagnosis present

## 2019-10-25 DIAGNOSIS — Z825 Family history of asthma and other chronic lower respiratory diseases: Secondary | ICD-10-CM

## 2019-10-25 DIAGNOSIS — J4551 Severe persistent asthma with (acute) exacerbation: Secondary | ICD-10-CM | POA: Diagnosis present

## 2019-10-25 DIAGNOSIS — D72829 Elevated white blood cell count, unspecified: Secondary | ICD-10-CM | POA: Diagnosis present

## 2019-10-25 DIAGNOSIS — Z91013 Allergy to seafood: Secondary | ICD-10-CM

## 2019-10-25 DIAGNOSIS — Z20822 Contact with and (suspected) exposure to covid-19: Secondary | ICD-10-CM | POA: Diagnosis present

## 2019-10-25 DIAGNOSIS — B370 Candidal stomatitis: Secondary | ICD-10-CM | POA: Diagnosis present

## 2019-10-25 DIAGNOSIS — Z880 Allergy status to penicillin: Secondary | ICD-10-CM

## 2019-10-25 DIAGNOSIS — Z87891 Personal history of nicotine dependence: Secondary | ICD-10-CM

## 2019-10-25 LAB — CBC WITH DIFFERENTIAL/PLATELET
Abs Immature Granulocytes: 0.14 10*3/uL — ABNORMAL HIGH (ref 0.00–0.07)
Basophils Absolute: 0.1 10*3/uL (ref 0.0–0.1)
Basophils Relative: 1 %
Eosinophils Absolute: 3.8 10*3/uL — ABNORMAL HIGH (ref 0.0–0.5)
Eosinophils Relative: 23 %
HCT: 50.7 % (ref 39.0–52.0)
Hemoglobin: 17.1 g/dL — ABNORMAL HIGH (ref 13.0–17.0)
Immature Granulocytes: 1 %
Lymphocytes Relative: 11 %
Lymphs Abs: 1.8 10*3/uL (ref 0.7–4.0)
MCH: 31.8 pg (ref 26.0–34.0)
MCHC: 33.7 g/dL (ref 30.0–36.0)
MCV: 94.4 fL (ref 80.0–100.0)
Monocytes Absolute: 1.1 10*3/uL — ABNORMAL HIGH (ref 0.1–1.0)
Monocytes Relative: 6 %
Neutro Abs: 9.9 10*3/uL — ABNORMAL HIGH (ref 1.7–7.7)
Neutrophils Relative %: 58 %
Platelets: 233 10*3/uL (ref 150–400)
RBC: 5.37 MIL/uL (ref 4.22–5.81)
RDW: 13.2 % (ref 11.5–15.5)
WBC: 16.9 10*3/uL — ABNORMAL HIGH (ref 4.0–10.5)
nRBC: 0 % (ref 0.0–0.2)

## 2019-10-25 LAB — COMPREHENSIVE METABOLIC PANEL
ALT: 77 U/L — ABNORMAL HIGH (ref 0–44)
AST: 62 U/L — ABNORMAL HIGH (ref 15–41)
Albumin: 3 g/dL — ABNORMAL LOW (ref 3.5–5.0)
Alkaline Phosphatase: 89 U/L (ref 38–126)
Anion gap: 8 (ref 5–15)
BUN: 12 mg/dL (ref 6–20)
CO2: 24 mmol/L (ref 22–32)
Calcium: 8.7 mg/dL — ABNORMAL LOW (ref 8.9–10.3)
Chloride: 104 mmol/L (ref 98–111)
Creatinine, Ser: 1.02 mg/dL (ref 0.61–1.24)
GFR calc Af Amer: >60 mL/min (ref >60)
GFR calc non Af Amer: >60 mL/min (ref >60)
Glucose, Bld: 85 mg/dL (ref 70–99)
Potassium: 4.3 mmol/L (ref 3.5–5.1)
Sodium: 136 mmol/L (ref 135–145)
Total Bilirubin: 0.9 mg/dL (ref 0.3–1.2)
Total Protein: 7.2 g/dL (ref 6.5–8.1)

## 2019-10-25 LAB — LACTIC ACID, PLASMA
Lactic Acid, Venous: 2.3 mmol/L (ref 0.5–1.9)
Lactic Acid, Venous: 4 mmol/L (ref 0.5–1.9)

## 2019-10-25 LAB — SARS CORONAVIRUS 2 BY RT PCR (HOSPITAL ORDER, PERFORMED IN ~~LOC~~ HOSPITAL LAB): SARS Coronavirus 2: NEGATIVE

## 2019-10-25 MED ORDER — CLOTRIMAZOLE 10 MG MT TROC
10.0000 mg | Freq: Every day | OROMUCOSAL | Status: DC
  Administered 2019-10-25 – 2019-10-27 (×8): 10 mg via ORAL
  Filled 2019-10-25 (×12): qty 1

## 2019-10-25 MED ORDER — PREDNISONE 20 MG PO TABS
40.0000 mg | ORAL_TABLET | Freq: Every day | ORAL | Status: DC
  Administered 2019-10-26: 40 mg via ORAL
  Filled 2019-10-25: qty 2

## 2019-10-25 MED ORDER — ALBUTEROL SULFATE (2.5 MG/3ML) 0.083% IN NEBU
5.0000 mg | INHALATION_SOLUTION | Freq: Once | RESPIRATORY_TRACT | Status: AC
  Administered 2019-10-25: 5 mg via RESPIRATORY_TRACT
  Filled 2019-10-25: qty 6

## 2019-10-25 MED ORDER — SODIUM CHLORIDE 0.9 % IV SOLN: INTRAVENOUS | Status: DC

## 2019-10-25 MED ORDER — ALBUTEROL SULFATE (2.5 MG/3ML) 0.083% IN NEBU
3.0000 mL | INHALATION_SOLUTION | Freq: Four times a day (QID) | RESPIRATORY_TRACT | Status: DC | PRN
  Filled 2019-10-25: qty 3

## 2019-10-25 MED ORDER — ENOXAPARIN SODIUM 40 MG/0.4ML ~~LOC~~ SOLN
40.0000 mg | SUBCUTANEOUS | Status: DC
  Administered 2019-10-26: 40 mg via SUBCUTANEOUS
  Filled 2019-10-25: qty 0.4

## 2019-10-25 MED ORDER — LEVOFLOXACIN IN D5W 750 MG/150ML IV SOLN
750.0000 mg | INTRAVENOUS | Status: DC
  Administered 2019-10-26: 750 mg via INTRAVENOUS
  Filled 2019-10-25: qty 150

## 2019-10-25 MED ORDER — VANCOMYCIN HCL 1500 MG/300ML IV SOLN
1500.0000 mg | Freq: Once | INTRAVENOUS | Status: AC
  Administered 2019-10-25: 1500 mg via INTRAVENOUS
  Filled 2019-10-25: qty 300

## 2019-10-25 MED ORDER — MONTELUKAST SODIUM 10 MG PO TABS
10.0000 mg | ORAL_TABLET | Freq: Every day | ORAL | Status: DC
  Administered 2019-10-25 – 2019-10-26 (×2): 10 mg via ORAL
  Filled 2019-10-25 (×3): qty 1

## 2019-10-25 MED ORDER — FLUTICASONE-UMECLIDIN-VILANT 200-62.5-25 MCG/INH IN AEPB: 1.0000 | INHALATION_SPRAY | Freq: Every day | RESPIRATORY_TRACT | Status: DC

## 2019-10-25 MED ORDER — LEVOFLOXACIN IN D5W 750 MG/150ML IV SOLN
750.0000 mg | Freq: Once | INTRAVENOUS | Status: AC
  Administered 2019-10-25: 750 mg via INTRAVENOUS
  Filled 2019-10-25: qty 150

## 2019-10-25 MED ORDER — DEXAMETHASONE SODIUM PHOSPHATE 10 MG/ML IJ SOLN
10.0000 mg | Freq: Once | INTRAMUSCULAR | Status: AC
  Administered 2019-10-25: 10 mg via INTRAVENOUS
  Filled 2019-10-25: qty 1

## 2019-10-25 MED ORDER — VANCOMYCIN HCL IN DEXTROSE 1-5 GM/200ML-% IV SOLN
1000.0000 mg | Freq: Three times a day (TID) | INTRAVENOUS | Status: DC
  Administered 2019-10-26 (×2): 1000 mg via INTRAVENOUS
  Filled 2019-10-25 (×6): qty 200

## 2019-10-25 NOTE — ED Notes (Signed)
Pt ambulated to bathroom unassisted.

## 2019-10-25 NOTE — ED Notes (Addendum)
Pt family member called to complain pt is having "difficulty breathing". Vitals assessed, SpO2 @ 99%. Pt states "I am breathing fine because I am sitting here being patent, get me to a room!"

## 2019-10-25 NOTE — Progress Notes (Addendum)
Pharmacy Antibiotic Note  Shannon Knapp is a 34 y.o. male admitted on 10/25/2019 with pneumonia.  Pharmacy has been consulted for vancomycin dosing. Pt is afebrile but WBC is elevated at 16.9. Scr is WNL and lactic acid is >2.   Plan: Vancomycin 1500mg  IV x 1 then 1gm IV Q8H F/u renal fxn, C&S, clinical status and trough at [facility] F/u continuation of gram negative coverage  Addendum: Continuing levaquin 750mg  IV Q24H  Height: 5\' 11"  (180.3 cm) Weight: 75.8 kg (167 lb) IBW/kg (Calculated) : 75.3  Temp (24hrs), Avg:98.7 F (37.1 C), Min:98.7 F (37.1 C), Max:98.7 F (37.1 C)  Recent Labs  Lab 10/22/19 1223 10/25/19 1401  WBC 11.9* 16.9*  CREATININE 0.93 1.02  LATICACIDVEN  --  2.3*    Estimated Creatinine Clearance: 107.7 mL/min (by C-G formula based on SCr of 1.02 mg/dL).    Allergies  Allergen Reactions  . Amoxicillin Hives    Has patient had a PCN reaction causing immediate rash, facial/tongue/throat swelling, SOB or lightheadedness with hypotension: Yes Has patient had a PCN reaction causing severe rash involving mucus membranes or skin necrosis: No Has patient had a PCN reaction that required hospitalization: No Has patient had a PCN reaction occurring within the last 10 years: Yes If all of the above answers are "NO", then may proceed with Cephalosporin use.  . Fish Allergy Swelling  . Shellfish Allergy Swelling  . Penicillins Hives    Has patient had a PCN reaction causing immediate rash, facial/tongue/throat swelling, SOB or lightheadedness with hypotension: Yes Has patient had a PCN reaction causing severe rash involving mucus membranes or skin necrosis: No Has patient had a PCN reaction that required hospitalization: No Has patient had a PCN reaction occurring within the last 10 years: Yes If all of the above answers are "NO", then may proceed with Cephalosporin use.    Antimicrobials this admission: Vanc 6/7>> Levaquin x 1 6/7  Dose adjustments this  admission: N/A  Microbiology results: Pending  Thank you for allowing pharmacy to be a part of this patient's care.  Cresta Riden, 12/22/19 10/25/2019 9:13 PM

## 2019-10-25 NOTE — ED Provider Notes (Signed)
[facility] EMERGENCY DEPARTMENT Provider Note   CSN: [id number] Arrival date & time: 10/25/19  1325     History Chief Complaint  Patient presents with  . Shortness of Breath  . Weakness    Jeramia LAJUAN KOVALESKI is a 34 y.o. male.  HPI    Patient presents with ongoing increased work of breathing, wheezing, cough, congestion.  Patient has history of asthma, and has been using doxycycline for this last week after speaking with his physician due to his symptoms. 3 days ago the patient went to [facility], due to worsening of symptoms in spite of his meds.  There he was diagnosed with pneumonia, started on azithromycin, and discharged. Today he notes that over the interval 3 days he has had worsening overall condition, with increasing fatigue, dyspnea, cough, congestion, increased work of breathing in spite of using albuterol multiple times daily, taking his azithromycin. Patient has been symptomatic enough that he took his albuterol nebulizer in the car as it was required to transport himself from home to the emergency department.  Past Medical History:  Diagnosis Date  . Asthma   . Chronic bronchitis ([place])   . COPD (chronic obstructive pulmonary disease) [facility])     Patient Active Problem List   Diagnosis Date Noted  . Medication management 04/30/2019  . Healthcare maintenance 11/26/2018  . Severe persistent asthma 05/28/2018  . Acute febrile illness 04/10/2018  . Upper airway cough syndrome   . Hypoxemia   . Syncope   . Allergic rhinitis   . Cough   . Hemoptysis   . Protein-calorie malnutrition, severe 10/20/2017  . Acute respiratory failure with hypoxemia ([place]) 10/20/2017  . Acute respiratory distress   . Asthma exacerbation   . Status asthmaticus 10/18/2017  . Severe persistent acute asthmatic bronchitis 10/17/2017  . Elevated blood pressure reading without diagnosis of hypertension 10/17/2017  . Respiratory distress 10/17/2017     Past Surgical History:  Procedure Laterality Date  . VIDEO BRONCHOSCOPY Bilateral 10/24/2017   Procedure: VIDEO BRONCHOSCOPY WITHOUT FLUORO;  Surgeon: Rush Farmer, MD;  Location: [facility] ENDOSCOPY;  Service: Cardiopulmonary;  Laterality: Bilateral;       Family History  Problem Relation Age of Onset  . Hypertension Mother   . Asthma Father   . Hypertension Father     Social History   Tobacco Use  . Smoking status: Former Smoker    Packs/day: 0.25    Years: 14.00    Pack years: 3.50    Types: Cigarettes    Start date: 2006    Quit date: 08/2018    Years since quitting: 1.1  . Smokeless tobacco: Never Used  Substance Use Topics  . Alcohol use: Yes    Alcohol/week: 15.0 standard drinks    Types: 15 Cans of beer per week  . Drug use: No    Home Medications Prior to Admission medications   Medication Sig Start Date End Date Taking? Authorizing Provider  albuterol (PROVENTIL) (2.5 MG/3ML) 0.083% nebulizer solution Take 3 mLs (2.5 mg total) by nebulization every 4 (four) hours as needed for up to 30 days for wheezing or shortness of breath. Use twice daily and as needed for coughing 10/29/18 08/11/19  Carrie Mew, MD  albuterol (VENTOLIN HFA) 108 [phone] Base) MCG/ACT inhaler Inhale 2 puffs into the lungs every 6 (six) hours as needed for wheezing or shortness of breath. 08/11/19   Tyler Pita, MD  albuterol (VENTOLIN HFA) 108 (90 Base) MCG/ACT inhaler Inhale  2 puffs into the lungs every 6 (six) hours as needed for shortness of breath. 10/22/19   Menshew, Charlesetta Ivory, PA-C  azithromycin (ZITHROMAX Z-PAK) 250 MG tablet Take 1 tablet (250 mg total) by mouth daily for 4 days. 10/23/19 10/27/19  Menshew, Charlesetta Ivory, PA-C  clotrimazole (MYCELEX) 10 MG troche Take 1 tablet (10 mg total) by mouth 5 (five) times daily for 7 days. 10/22/19 10/29/19  Menshew, Charlesetta Ivory, PA-C  doxycycline (VIBRA-TABS) 100 MG tablet Take 1 tablet (100 mg total) by mouth 2 (two) times daily. 10/20/19    Parrett, Virgel Bouquet, NP  Fluticasone-Umeclidin-Vilant (TRELEGY ELLIPTA) 200-62.5-25 MCG/INH AEPB Inhale 1 puff into the lungs daily. 10/20/19   Parrett, Virgel Bouquet, NP  Fluticasone-Umeclidin-Vilant (TRELEGY ELLIPTA) 200-62.5-25 MCG/INH AEPB Inhale 1 puff into the lungs daily. 10/20/19   Parrett, Virgel Bouquet, NP  montelukast (SINGULAIR) 10 MG tablet Take 1 tablet (10 mg total) by mouth at bedtime. 08/11/19   Salena Saner, MD  predniSONE (DELTASONE) 10 MG tablet Can take 0.5 (5 mg total) to 2 tablets (20mg ) daily as needed based on symptoms 08/11/19   08/13/19, MD    Allergies    Amoxicillin, Fish allergy, Shellfish allergy, and Penicillins  Review of Systems   Review of Systems  Constitutional:       Per HPI, otherwise negative  HENT:       Per HPI, otherwise negative  Respiratory:       Per HPI, otherwise negative  Cardiovascular:       Per HPI, otherwise negative  Gastrointestinal: Negative for vomiting.  Endocrine:       Negative aside from HPI  Genitourinary:       Neg aside from HPI   Musculoskeletal:       Per HPI, otherwise negative  Skin: Negative.   Neurological: Negative for syncope.    Physical Exam Updated Vital Signs BP (!) 149/111   Pulse (!) 101   Temp 98.7 F (37.1 C) (Oral)   Resp (!) 25   Ht 5\' 11"  (1.803 m)   Wt 75.8 kg   SpO2 100%   BMI 23.29 kg/m   Physical Exam Vitals and nursing note reviewed.  Constitutional:      General: He is not in acute distress.    Appearance: He is well-developed.  HENT:     Head: Normocephalic and atraumatic.  Eyes:     Conjunctiva/sclera: Conjunctivae normal.  Cardiovascular:     Rate and Rhythm: Regular rhythm. Tachycardia present.  Pulmonary:     Effort: Tachypnea and accessory muscle usage present.  Abdominal:     General: There is no distension.  Skin:    General: Skin is warm and dry.  Neurological:     Mental Status: He is alert and oriented to person, place, and time.      ED Results /  Procedures / Treatments   Labs (all labs ordered are listed, but only abnormal results are displayed) Labs Reviewed  CBC WITH DIFFERENTIAL/PLATELET - Abnormal; Notable for the following components:      Result Value   WBC 16.9 (*)    Hemoglobin 17.1 (*)    Neutro Abs 9.9 (*)    Monocytes Absolute 1.1 (*)    Eosinophils Absolute 3.8 (*)    Abs Immature Granulocytes 0.14 (*)    All other components within normal limits  COMPREHENSIVE METABOLIC PANEL - Abnormal; Notable for the following components:   Calcium 8.7 (*)  Albumin 3.0 (*)    AST 62 (*)    ALT 77 (*)    All other components within normal limits  LACTIC ACID, PLASMA - Abnormal; Notable for the following components:   Lactic Acid, Venous 2.3 (*)    All other components within normal limits  LACTIC ACID, PLASMA  PATHOLOGIST SMEAR REVIEW    Radiology DG Chest 2 View  Result Date: 10/25/2019 CLINICAL DATA:  Cough and weakness for 1 day EXAM: CHEST - 2 VIEW COMPARISON:  10/22/2019 FINDINGS: Cardiac shadow is stable. The lungs are again hyperinflated. Increasing left lower lobe infiltrate is noted. No sizable effusion is noted. Some mild right basilar infiltrate is seen as well. No bony abnormality is noted. IMPRESSION: Significant increase in left lower lobe pneumonia when compared with the prior exam. Electronically Signed   By: Alcide Clever M.D.   On: 10/25/2019 15:41    Procedures Procedures (including critical care time)  Medications Ordered in ED Medications  levofloxacin (LEVAQUIN) IVPB 750 mg (has no administration in time range)  vancomycin (VANCOREADY) IVPB 1500 mg/300 mL (has no administration in time range)  vancomycin (VANCOCIN) IVPB 1000 mg/200 mL premix (has no administration in time range)    ED Course  I have reviewed the triage vital signs and the nursing notes.  Pertinent labs & imaging results that were available during my care of the patient were reviewed by me and considered in my medical decision  making (see chart for details).  After the initial evaluation I reviewed the patient's chart including documentation from our affiliated hospital where he was diagnosed with pneumonia. Today, in comparison, the patient has worsening changes on x-ray concerning for progression of his pneumonia, now in spite of using azithromycin and doxycycline. In addition, patient is found to have elevated leukocytosis beyond that of a few days ago, with left shift, suggesting absence of response to outpatient therapy.  In this patient with reactive airway disease, given his demonstration of failure to improve with outpatient therapy will require initiation of IV antibiotics, vancomycin, levofloxacin, admission for additional nebulizer, steroids, ongoing therapy for his community-acquired pneumonia refractory to outpatient therapy.   MDM Number of Diagnoses or Management Options   Amount and/or Complexity of Data Reviewed Clinical lab tests: reviewed Tests in the radiology section of CPT: reviewed Tests in the medicine section of CPT: reviewed Decide to obtain previous medical records or to obtain history from someone other than the patient: yes Review and summarize past medical records: yes Discuss the patient with other providers: yes Independent visualization of images, tracings, or specimens: yes  Risk of Complications, Morbidity, and/or Mortality Presenting problems: high Diagnostic procedures: high Management options: high   Final Clinical Impression(s) / ED Diagnoses Final diagnoses:  Community acquired pneumonia of left lower lobe of lung     Gerhard Munch, MD 10/25/19 2119

## 2019-10-25 NOTE — H&P (Signed)
History and Physical    [name]:[id number] DOB: [birth year] DOA: 10/25/2019  PCP: Patient, No Pcp Per  Patient coming from: Home  I have personally briefly reviewed patient's old medical records in [facility]  Chief Complaint: SOB  HPI: Shannon Knapp is a 34 y.o. male with medical history significant of severe asthma.  Pt seen with SOB x3 days ago at [facility], diagnosed with LLL PNA on CXR.  Had been on doxycycline prior to ED visit, was started on azithromycin.  Over past 3 days: worsening fatigue, dyspnea, cough, congestion, increased WOB.  Albuterol and azithromycin not helping.  In to ED.   ED Course: CXR reveals worsening of the LLL PNA.  WBC now 16k up from 11k 3 days ago.  COVID was neg 3 days ago.  He hasnt had COVID vaccine though.   Review of Systems: As per HPI, otherwise all review of systems negative.  Past Medical History:  Diagnosis Date  . Asthma   . Chronic bronchitis (HCC)   . COPD (chronic obstructive pulmonary disease) (HCC)     Past Surgical History:  Procedure Laterality Date  . VIDEO BRONCHOSCOPY Bilateral 10/24/2017   Procedure: VIDEO BRONCHOSCOPY WITHOUT FLUORO;  Surgeon: Alyson Reedy, MD;  Location: [facility] ENDOSCOPY;  Service: Cardiopulmonary;  Laterality: Bilateral;     reports that he quit smoking about 14 months ago. His smoking use included cigarettes. He started smoking about 15 years ago. He has a 3.50 pack-year smoking history. He has never used smokeless tobacco. He reports current alcohol use of about 15.0 standard drinks of alcohol per week. He reports that he does not use drugs.  Allergies  Allergen Reactions  . Amoxicillin Hives    Has patient had a PCN reaction causing immediate rash, facial/tongue/throat swelling, SOB or lightheadedness with hypotension: Yes Has patient had a PCN reaction causing severe rash involving mucus membranes or skin necrosis: No Has patient had a PCN reaction that required hospitalization:  No Has patient had a PCN reaction occurring within the last 10 years: Yes If all of the above answers are "NO", then may proceed with Cephalosporin use.  . Fish Allergy Swelling  . Shellfish Allergy Swelling  . Penicillins Hives    Has patient had a PCN reaction causing immediate rash, facial/tongue/throat swelling, SOB or lightheadedness with hypotension: Yes Has patient had a PCN reaction causing severe rash involving mucus membranes or skin necrosis: No Has patient had a PCN reaction that required hospitalization: No Has patient had a PCN reaction occurring within the last 10 years: Yes If all of the above answers are "NO", then may proceed with Cephalosporin use.    Family History  Problem Relation Age of Onset  . Hypertension Mother   . Asthma Father   . Hypertension Father      Prior to Admission medications   Medication Sig Start Date End Date Taking? Authorizing Provider  albuterol (PROVENTIL) (2.5 MG/3ML) 0.083% nebulizer solution Take 3 mLs (2.5 mg total) by nebulization every 4 (four) hours as needed for up to 30 days for wheezing or shortness of breath. Use twice daily and as needed for coughing 10/29/18 10/25/19 Yes Sharman Cheek, MD  albuterol (VENTOLIN HFA) 108 (90 Base) MCG/ACT inhaler Inhale 2 puffs into the lungs every 6 (six) hours as needed for shortness of breath. 10/22/19  Yes Menshew, Charlesetta Ivory, PA-C  Aspirin-Salicylamide-Caffeine (ARTHRITIS STRENGTH BC POWDER PO) Take 1 Package by mouth in the morning and at bedtime.  Yes [provider]  azithromycin (ZITHROMAX Z-PAK) 250 MG tablet Take 1 tablet (250 mg total) by mouth daily for 4 days. 10/23/19 10/27/19 Yes Menshew, Dannielle Karvonen, PA-C  diphenhydrAMINE (BENADRYL) 25 MG tablet Take 25 mg by mouth 2 (two) times daily as needed for allergies.   Yes [provider]  doxycycline (VIBRA-TABS) 100 MG tablet Take 1 tablet (100 mg total) by mouth 2 (two) times daily. 10/20/19  Yes Parrett, Tammy S, NP   Fluticasone-Umeclidin-Vilant (TRELEGY ELLIPTA) 200-62.5-25 MCG/INH AEPB Inhale 1 puff into the lungs daily. 10/20/19  Yes Parrett, Tammy S, NP  GUAIFENESIN 1200 PO Take 1 tablet by mouth daily.   Yes [provider]  montelukast (SINGULAIR) 10 MG tablet Take 1 tablet (10 mg total) by mouth at bedtime. 08/11/19  Yes Tyler Pita, MD  predniSONE (DELTASONE) 10 MG tablet Can take 0.5 (5 mg total) to 2 tablets (20mg ) daily as needed based on symptoms 08/11/19  Yes Tyler Pita, MD  albuterol (VENTOLIN HFA) 108 (90 Base) MCG/ACT inhaler Inhale 2 puffs into the lungs every 6 (six) hours as needed for wheezing or shortness of breath. Patient not taking: Reported on 10/25/2019 08/11/19   Tyler Pita, MD  clotrimazole [facility]) 10 MG troche Take 1 tablet (10 mg total) by mouth 5 (five) times daily for 7 days. 10/22/19 10/29/19  Menshew, Dannielle Karvonen, PA-C  Fluticasone-Umeclidin-Vilant (TRELEGY ELLIPTA) 200-62.5-25 MCG/INH AEPB Inhale 1 puff into the lungs daily. Patient not taking: Reported on 10/25/2019 10/20/19   Melvenia Needles, NP    Physical Exam: Vitals:   10/25/19 1327 10/25/19 1356 10/25/19 1948 10/25/19 2045  BP: (!) 136/97  (!) 140/99 (!) 149/111  Pulse: (!) 103  92 (!) 101  Resp: 16  16 (!) 25  Temp: 98.7 F (37.1 C)     TempSrc: Oral     SpO2: 98%  99% 100%  Weight:  75.8 kg    Height:  5\' 11"  (1.803 m)      Constitutional: NAD, calm, comfortable Eyes: PERRL, lids and conjunctivae normal ENMT: Mucous membranes are moist. Posterior pharynx clear of any exudate or lesions.Normal dentition.  Neck: normal, supple, no masses, no thyromegaly Respiratory: Mild tachypnea, having breathing treatment right now Cardiovascular: Mild tachycardia Abdomen: no tenderness, no masses palpated. No hepatosplenomegaly. Bowel sounds positive.  Musculoskeletal: no clubbing / cyanosis. No joint deformity upper and lower extremities. Good ROM, no contractures. Normal muscle tone.  Skin:  no rashes, lesions, ulcers. No induration Neurologic: CN 2-12 grossly intact. Sensation intact, DTR normal. Strength 5/5 in all 4.  Psychiatric: Normal judgment and insight. Alert and oriented x 3. Normal mood.    Labs on Admission: I have personally reviewed following labs and imaging studies  CBC: Recent Labs  Lab 10/22/19 1223 10/25/19 1401  WBC 11.9* 16.9*  NEUTROABS  --  9.9*  HGB 16.4 17.1*  HCT 47.6 50.7  MCV 92.2 94.4  PLT 297 213   Basic Metabolic Panel: Recent Labs  Lab 10/22/19 1223 10/25/19 1401  NA 138 136  K 3.3* 4.3  CL 101 104  CO2 26 24  GLUCOSE 95 85  BUN 9 12  CREATININE 0.93 1.02  CALCIUM 9.1 8.7*   GFR: Estimated Creatinine Clearance: 107.7 mL/min (by C-G formula based on SCr of 1.02 mg/dL). Liver Function Tests: Recent Labs  Lab 10/25/19 1401  AST 62*  ALT 77*  ALKPHOS 89  BILITOT 0.9  PROT 7.2  ALBUMIN 3.0*   No  results for input(s): LIPASE, AMYLASE in the last 168 hours. No results for input(s): AMMONIA in the last 168 hours. Coagulation Profile: No results for input(s): INR, PROTIME in the last 168 hours. Cardiac Enzymes: No results for input(s): CKTOTAL, CKMB, CKMBINDEX, TROPONINI in the last 168 hours. BNP (last 3 results) No results for input(s): PROBNP in the last 8760 hours. HbA1C: No results for input(s): HGBA1C in the last 72 hours. CBG: No results for input(s): GLUCAP in the last 168 hours. Lipid Profile: No results for input(s): CHOL, HDL, LDLCALC, TRIG, CHOLHDL, LDLDIRECT in the last 72 hours. Thyroid Function Tests: No results for input(s): TSH, T4TOTAL, FREET4, T3FREE, THYROIDAB in the last 72 hours. Anemia Panel: No results for input(s): VITAMINB12, FOLATE, FERRITIN, TIBC, IRON, RETICCTPCT in the last 72 hours. Urine analysis:    Component Value Date/Time   COLORURINE STRAW (A) 10/18/2017 0751   APPEARANCEUR CLEAR 10/18/2017 0751   LABSPEC 1.006 10/18/2017 0751   PHURINE 6.0 10/18/2017 0751   GLUCOSEU 50 (A)  10/18/2017 0751   HGBUR NEGATIVE 10/18/2017 0751   BILIRUBINUR NEGATIVE 10/18/2017 0751   KETONESUR NEGATIVE 10/18/2017 0751   PROTEINUR NEGATIVE 10/18/2017 0751   NITRITE NEGATIVE 10/18/2017 0751   LEUKOCYTESUR NEGATIVE 10/18/2017 0751    Radiological Exams on Admission: DG Chest 2 View  Result Date: 10/25/2019 CLINICAL DATA:  Cough and weakness for 1 day EXAM: CHEST - 2 VIEW COMPARISON:  10/22/2019 FINDINGS: Cardiac shadow is stable. The lungs are again hyperinflated. Increasing left lower lobe infiltrate is noted. No sizable effusion is noted. Some mild right basilar infiltrate is seen as well. No bony abnormality is noted. IMPRESSION: Significant increase in left lower lobe pneumonia when compared with the prior exam. Electronically Signed   By: Alcide Clever M.D.   On: 10/25/2019 15:41    EKG: Independently reviewed.  Assessment/Plan Principal Problem:   Community acquired pneumonia of left lower lobe of lung Active Problems:   Asthma exacerbation   Severe persistent asthma    1. CAP of LLL - 1. Got levaquin and vanc in ED 2. Cont levaquin 3. MRSA pending: if positive then re-order vanc 4. Repeat COVID test pending, though seems less likely: 1. Elevated WBC 2. Neg COVID test x3 days ago 3. Lobar appearance 4. Reduced spread of COVID in community 5. Tele monitor for tachycardia 6. NS at 125 2. Asthma exacerbation - 1. Got decadron in ED 2. Prednisone 40mg  PO daily 3. Adult wheeze protocol 4. PRN albuterol 5. Scheduled home nebs 3. Needs COVID vaccine - 1. Needs this after recovery from PNA  DVT prophylaxis: Lovenox Code Status: Full Family Communication: No family in room Disposition Plan: Home after respiratory status improved Consults called: None Admission status: Admit to inpatient  Severity of Illness: The appropriate patient status for this patient is INPATIENT. Inpatient status is judged to be reasonable and necessary in order to provide the required  intensity of service to ensure the patient's safety. The patient's presenting symptoms, physical exam findings, and initial radiographic and laboratory data in the context of their chronic comorbidities is felt to place them at high risk for further clinical deterioration. Furthermore, it is not anticipated that the patient will be medically stable for discharge from the hospital within 2 midnights of admission. The following factors support the patient status of inpatient.   IP status due to failed outpatient therapy for PNA.   * I certify that at the point of admission it is my clinical judgment that the patient will  require inpatient hospital care spanning beyond 2 midnights from the point of admission due to high intensity of service, high risk for further deterioration and high frequency of surveillance required.*    Preet Perrier M. DO [facility]  How to contact the [facility] Attending or Consulting provider 7A - 7P or covering provider during after hours 7P -7A, for this patient?  1. Check the care team in [facility] and look for a) attending/consulting [facility] provider listed and b) the [facility] team listed 2. Log into [email or web address]  Amion Physician Scheduling and messaging for groups and whole hospitals  On call and physician scheduling software for group practices, residents, hospitalists and other medical providers for call, clinic, rotation and shift schedules. OnCall Enterprise is a hospital-wide system for scheduling doctors and paging doctors on call. EasyPlot is for scientific plotting and data analysis.  [email or web address]  and use [facility]'s universal password to access. If you do not have the password, please contact the hospital operator.  3. Locate the [facility] provider you are looking for under [facility] and page to a number that you can be directly reached. 4. If you still have difficulty reaching the provider, please page the [facility] (Director on Call) for the Hospitalists listed on  amion for assistance.  10/25/2019, 9:52 PM

## 2019-10-25 NOTE — ED Triage Notes (Signed)
Pt arrives POV for eval of SOB and weakness. Pt reports he was seen at [facility] for same 2 days ago and started on ABX at that time. States since then he has felt worse. Reports DOE, unable to eat or drink, diaphoretic and lethargic in triage.

## 2019-10-26 LAB — CBC
HCT: 46.9 % (ref 39.0–52.0)
Hemoglobin: 15.9 g/dL (ref 13.0–17.0)
MCH: 31.6 pg (ref 26.0–34.0)
MCHC: 33.9 g/dL (ref 30.0–36.0)
MCV: 93.2 fL (ref 80.0–100.0)
Platelets: 224 10*3/uL (ref 150–400)
RBC: 5.03 MIL/uL (ref 4.22–5.81)
RDW: 12.9 % (ref 11.5–15.5)
WBC: 16 10*3/uL — ABNORMAL HIGH (ref 4.0–10.5)
nRBC: 0 % (ref 0.0–0.2)

## 2019-10-26 LAB — HEPATIC FUNCTION PANEL
ALT: 140 U/L — ABNORMAL HIGH (ref 0–44)
AST: 89 U/L — ABNORMAL HIGH (ref 15–41)
Albumin: 2.8 g/dL — ABNORMAL LOW (ref 3.5–5.0)
Alkaline Phosphatase: 90 U/L (ref 38–126)
Bilirubin, Direct: 0.2 mg/dL (ref 0.0–0.2)
Indirect Bilirubin: 0.8 mg/dL (ref 0.3–0.9)
Total Bilirubin: 1 mg/dL (ref 0.3–1.2)
Total Protein: 7.2 g/dL (ref 6.5–8.1)

## 2019-10-26 LAB — BASIC METABOLIC PANEL
Anion gap: 8 (ref 5–15)
Anion gap: 8 (ref 5–15)
BUN: 11 mg/dL (ref 6–20)
BUN: 9 mg/dL (ref 6–20)
CO2: 23 mmol/L (ref 22–32)
CO2: 25 mmol/L (ref 22–32)
Calcium: 8.2 mg/dL — ABNORMAL LOW (ref 8.9–10.3)
Calcium: 8.5 mg/dL — ABNORMAL LOW (ref 8.9–10.3)
Chloride: 99 mmol/L (ref 98–111)
Chloride: 98 mmol/L (ref 98–111)
Creatinine, Ser: 0.95 mg/dL (ref 0.61–1.24)
Creatinine, Ser: 0.95 mg/dL (ref 0.61–1.24)
GFR calc Af Amer: >60 mL/min (ref >60)
GFR calc Af Amer: >60 mL/min (ref >60)
GFR calc non Af Amer: >60 mL/min (ref >60)
GFR calc non Af Amer: >60 mL/min (ref >60)
Glucose, Bld: 130 mg/dL — ABNORMAL HIGH (ref 70–99)
Glucose, Bld: 131 mg/dL — ABNORMAL HIGH (ref 70–99)
Potassium: 5.7 mmol/L — ABNORMAL HIGH (ref 3.5–5.1)
Potassium: 4.9 mmol/L (ref 3.5–5.1)
Sodium: 130 mmol/L — ABNORMAL LOW (ref 135–145)
Sodium: 131 mmol/L — ABNORMAL LOW (ref 135–145)

## 2019-10-26 LAB — SEDIMENTATION RATE: Sed Rate: 2 mm/hr (ref 0–16)

## 2019-10-26 LAB — PATHOLOGIST SMEAR REVIEW

## 2019-10-26 LAB — MAGNESIUM: Magnesium: 2 mg/dL (ref 1.7–2.4)

## 2019-10-26 LAB — PROCALCITONIN: Procalcitonin: 0.59 ng/mL

## 2019-10-26 LAB — HIV ANTIBODY (ROUTINE TESTING W REFLEX): HIV Screen 4th Generation wRfx: NONREACTIVE

## 2019-10-26 LAB — LACTIC ACID, PLASMA: Lactic Acid, Venous: 2.6 mmol/L (ref 0.5–1.9)

## 2019-10-26 LAB — C-REACTIVE PROTEIN: CRP: 5 mg/dL — ABNORMAL HIGH (ref <1.0)

## 2019-10-26 LAB — STREP PNEUMONIAE URINARY ANTIGEN: Strep Pneumo Urinary Antigen: NEGATIVE

## 2019-10-26 MED ORDER — BENZONATATE 100 MG PO CAPS
100.0000 mg | ORAL_CAPSULE | Freq: Three times a day (TID) | ORAL | Status: DC
  Administered 2019-10-26 – 2019-10-27 (×4): 100 mg via ORAL
  Filled 2019-10-26 (×5): qty 1

## 2019-10-26 MED ORDER — FLUTICASONE FUROATE-VILANTEROL 200-25 MCG/INH IN AEPB
1.0000 | INHALATION_SPRAY | Freq: Every day | RESPIRATORY_TRACT | Status: DC
  Filled 2019-10-26: qty 28

## 2019-10-26 MED ORDER — ACETAMINOPHEN 325 MG PO TABS: 650.0000 mg | ORAL_TABLET | Freq: Four times a day (QID) | ORAL | Status: DC | PRN

## 2019-10-26 MED ORDER — ONDANSETRON HCL 4 MG/2ML IJ SOLN: 4.0000 mg | Freq: Four times a day (QID) | INTRAMUSCULAR | Status: DC | PRN

## 2019-10-26 MED ORDER — ALBUTEROL SULFATE (2.5 MG/3ML) 0.083% IN NEBU
2.5000 mg | INHALATION_SOLUTION | Freq: Four times a day (QID) | RESPIRATORY_TRACT | Status: DC
  Administered 2019-10-26 – 2019-10-27 (×5): 2.5 mg via RESPIRATORY_TRACT
  Filled 2019-10-26 (×5): qty 3

## 2019-10-26 MED ORDER — UMECLIDINIUM BROMIDE 62.5 MCG/INH IN AEPB
1.0000 | INHALATION_SPRAY | Freq: Every day | RESPIRATORY_TRACT | Status: DC
  Filled 2019-10-26: qty 7

## 2019-10-26 MED ORDER — LINEZOLID 600 MG PO TABS
600.0000 mg | ORAL_TABLET | Freq: Two times a day (BID) | ORAL | Status: AC
  Administered 2019-10-27: 600 mg via ORAL
  Filled 2019-10-26: qty 1

## 2019-10-26 MED ORDER — MENTHOL 3 MG MT LOZG
1.0000 | LOZENGE | OROMUCOSAL | Status: DC | PRN
  Administered 2019-10-26: 3 mg via ORAL
  Filled 2019-10-26: qty 9

## 2019-10-26 MED ORDER — DM-GUAIFENESIN ER 30-600 MG PO TB12
1.0000 | ORAL_TABLET | Freq: Two times a day (BID) | ORAL | Status: DC
  Administered 2019-10-26 – 2019-10-27 (×3): 1 via ORAL
  Filled 2019-10-26 (×4): qty 1

## 2019-10-26 MED ORDER — METHYLPREDNISOLONE SODIUM SUCC 40 MG IJ SOLR
40.0000 mg | Freq: Two times a day (BID) | INTRAMUSCULAR | Status: DC
  Administered 2019-10-26 – 2019-10-27 (×3): 40 mg via INTRAVENOUS
  Filled 2019-10-26 (×3): qty 1

## 2019-10-26 NOTE — Progress Notes (Signed)
Pt continues on IV ABT; as soon as Vancomycin started pt stated "this is burning". Medication stopped and MD paged. Awaiting N.O.

## 2019-10-26 NOTE — Progress Notes (Signed)
RN informed patient had no complaints with IV Levaquin but complained immediately of burning with start of vancomycin.  IV flushed without difficulty.  Discussed with pharmacist, who recommended PO Zyvox x1 for coverage.

## 2019-10-26 NOTE — Progress Notes (Signed)
[facility] Progress Note  Patient: Shannon Knapp    OZH:[id number]  DOA: 10/25/2019     Date of Service: the patient was seen and examined on 10/26/2019  Chief Complaint  Patient presents with   Shortness of Breath   Weakness   Brief hospital course: Patient with past medical history of asthma.  Admitted with community-acquired pneumonia and asthma exacerbation Currently plan is continue nebulizer and antibiotics and steroids.  Assessment and Plan: 1.  Left lower lobe community-acquired pneumonia Asthma exacerbation Covid negative. Seen outpatient initially was started on azithromycin without any improvement. Comes to the ER with worsening shortness of breath. Not hypoxic but significantly tight wheezing on examination. Continue IV Solu-Medrol. Scheduled albuterol and as needed albuterol as well. Continue to monitor on telemetry for now. Follow-up on cultures. De-escalate from vancomycin and Levaquin soon.  MRSA PCR currently pending.  Diet: Regular diet DVT Prophylaxis: Subcutaneous Heparin    Advance goals of care discussion: Full code  Family Communication: no family was present at bedside, at the time of interview.   Disposition:  Status is: Inpatient  Remains inpatient appropriate because:IV treatments appropriate due to intensity of illness or inability to take PO   Dispo: The patient is from: Home              Anticipated d/c is to: Home              Anticipated d/c date is: 2 days              Patient currently is not medically stable to d/c.        Subjective: Continues to have shortness of breath continues to have cough.  No nausea no vomiting.  No chest pain.  Physical Exam:  General: Appear in moderate distress, no Rash; Oral Mucosa clear. no Abnormal Neck Mass Or lumps, Conjunctiva normal  Cardiovascular: S1 and S2 Present, no Murmur, Respiratory: increased respiratory effort, Bilateral Air entry present and bilateral Crackles,  bilateral  wheezes Abdomen: Bowel Sound present, Soft and no tenderness Extremities: no Pedal edema, no calf tenderness Neurology: alert and oriented to time, place, and person affect appropriate. no new focal deficit Gait not checked due to patient safety concerns  Vitals:   10/26/19 1415 10/26/19 1430 10/26/19 1529 10/26/19 1649  BP: 120/77 127/88 131/84   Pulse: 77  82 88  Resp: 15  (!) 22 20  Temp:   97.6 F (36.4 C)   TempSrc:   Oral   SpO2: 92%  96% 92%  Weight:      Height:        Intake/Output Summary (Last 24 hours) at 10/26/2019 1801 Last data filed at 10/26/2019 1700 Gross per 24 hour  Intake 393.98 ml  Output --  Net 393.98 ml   Filed Weights   10/25/19 1356  Weight: 75.8 kg    Data Reviewed: I have personally reviewed and interpreted daily labs, tele strips, imagings as discussed above. I reviewed all nursing notes, pharmacy notes, vitals, pertinent old records I have discussed plan of care as described above with RN and patient/family.  CBC: Recent Labs  Lab 10/22/19 1223 10/25/19 1401 10/26/19 0528  WBC 11.9* 16.9* 16.0*  NEUTROABS  --  9.9*  --   HGB 16.4 17.1* 15.9  HCT 47.6 50.7 46.9  MCV 92.2 94.4 93.2  PLT 297 233 629   Basic Metabolic Panel: Recent Labs  Lab 10/22/19 1223 10/25/19 1401 10/26/19 0528 10/26/19 0923  NA 138 136 130*  131*  K 3.3* 4.3 5.7* 4.9  CL 101 104 99 98  CO2 26 24 23 25   GLUCOSE 95 85 130* 131*  BUN 9 12 11 9   CREATININE 0.93 1.02 0.95 0.95  CALCIUM 9.1 8.7* 8.2* 8.5*  MG  --   --   --  2.0    Studies: No results found.  Scheduled Meds:  albuterol  2.5 mg Nebulization QID   benzonatate  100 mg Oral TID   clotrimazole  10 mg Oral 5 X Daily   dextromethorphan-guaiFENesin  1 tablet Oral BID   enoxaparin (LOVENOX) injection  40 mg Subcutaneous Q24H   methylPREDNISolone (SOLU-MEDROL) injection  40 mg Intravenous Q12H   montelukast  10 mg Oral QHS   umeclidinium bromide  1 puff Inhalation Daily    Continuous Infusions:  levofloxacin (LEVAQUIN) IV     vancomycin 1,000 mg (10/26/19 1433)   PRN Meds: acetaminophen, albuterol, menthol-cetylpyridinium, ondansetron (ZOFRAN) IV  Time spent: 35 minutes  Author: , MD [facility] 10/26/2019 6:01 PM  To reach On-call, see care teams to locate the attending and reach out via [email or web address]. Between 7PM-7AM, please contact night-coverage If you still have difficulty reaching the attending provider, please page the [facility] (Director on Call) for [facility] on amion for assistance.

## 2019-10-26 NOTE — ED Notes (Signed)
Lunch Tray Ordered @ 1058. 

## 2019-10-26 NOTE — ED Notes (Signed)
Tele  Breakfast Ordered 

## 2019-10-27 LAB — CBC WITH DIFFERENTIAL/PLATELET
Abs Immature Granulocytes: 0.18 10*3/uL — ABNORMAL HIGH (ref 0.00–0.07)
Basophils Absolute: 0.1 10*3/uL (ref 0.0–0.1)
Basophils Relative: 0 %
Eosinophils Absolute: 1.2 10*3/uL — ABNORMAL HIGH (ref 0.0–0.5)
Eosinophils Relative: 5 %
HCT: 41.4 % (ref 39.0–52.0)
Hemoglobin: 14.3 g/dL (ref 13.0–17.0)
Immature Granulocytes: 1 %
Lymphocytes Relative: 13 %
Lymphs Abs: 2.8 10*3/uL (ref 0.7–4.0)
MCH: 31.8 pg (ref 26.0–34.0)
MCHC: 34.5 g/dL (ref 30.0–36.0)
MCV: 92 fL (ref 80.0–100.0)
Monocytes Absolute: 1.2 10*3/uL — ABNORMAL HIGH (ref 0.1–1.0)
Monocytes Relative: 6 %
Neutro Abs: 16.4 10*3/uL — ABNORMAL HIGH (ref 1.7–7.7)
Neutrophils Relative %: 75 %
Platelets: 220 10*3/uL (ref 150–400)
RBC: 4.5 MIL/uL (ref 4.22–5.81)
RDW: 12.8 % (ref 11.5–15.5)
WBC: 21.9 10*3/uL — ABNORMAL HIGH (ref 4.0–10.5)
nRBC: 0 % (ref 0.0–0.2)

## 2019-10-27 LAB — COMPREHENSIVE METABOLIC PANEL
ALT: 131 U/L — ABNORMAL HIGH (ref 0–44)
AST: 61 U/L — ABNORMAL HIGH (ref 15–41)
Albumin: 2.6 g/dL — ABNORMAL LOW (ref 3.5–5.0)
Alkaline Phosphatase: 83 U/L (ref 38–126)
Anion gap: 8 (ref 5–15)
BUN: 11 mg/dL (ref 6–20)
CO2: 23 mmol/L (ref 22–32)
Calcium: 8.7 mg/dL — ABNORMAL LOW (ref 8.9–10.3)
Chloride: 103 mmol/L (ref 98–111)
Creatinine, Ser: 0.88 mg/dL (ref 0.61–1.24)
GFR calc Af Amer: >60 mL/min (ref >60)
GFR calc non Af Amer: >60 mL/min (ref >60)
Glucose, Bld: 116 mg/dL — ABNORMAL HIGH (ref 70–99)
Potassium: 4.4 mmol/L (ref 3.5–5.1)
Sodium: 134 mmol/L — ABNORMAL LOW (ref 135–145)
Total Bilirubin: 0.6 mg/dL (ref 0.3–1.2)
Total Protein: 6.6 g/dL (ref 6.5–8.1)

## 2019-10-27 LAB — MRSA PCR SCREENING: MRSA by PCR: NEGATIVE

## 2019-10-27 MED ORDER — PREDNISONE 50 MG PO TABS
50.0000 mg | ORAL_TABLET | Freq: Every day | ORAL | 0 refills | Status: AC
Start: 2019-10-27 — End: 2019-10-31

## 2019-10-27 MED ORDER — LEVOFLOXACIN 750 MG PO TABS
750.0000 mg | ORAL_TABLET | Freq: Every day | ORAL | 0 refills | Status: AC
Start: 2019-10-27 — End: 2019-11-01

## 2019-10-27 MED ORDER — BENZONATATE 100 MG PO CAPS: 100.0000 mg | ORAL_CAPSULE | Freq: Three times a day (TID) | ORAL | 0 refills | Status: DC

## 2019-10-27 MED ORDER — METHYLPREDNISOLONE SODIUM SUCC 125 MG IJ SOLR: 60.0000 mg | Freq: Two times a day (BID) | INTRAMUSCULAR | Status: DC

## 2019-10-27 NOTE — Discharge Instructions (Signed)
Community-Acquired Pneumonia, Adult Pneumonia is an infection of the lungs. It causes swelling in the airways of the lungs. Mucus and fluid may also build up inside the airways. One type of pneumonia can happen while a person is in a hospital. A different type can happen when a person is not in a hospital (community-acquired pneumonia).  What are the causes?  This condition is caused by germs (viruses, bacteria, or fungi). Some types of germs can be passed from one person to another. This can happen when you breathe in droplets from the cough or sneeze of an infected person. What increases the risk? You are more likely to develop this condition if you:  Have a long-term (chronic) disease, such as: ? Chronic obstructive pulmonary disease (COPD). ? Asthma. ? Cystic fibrosis. ? Congestive heart failure. ? Diabetes. ? Kidney disease.  Have HIV.  Have sickle cell disease.  Have had your spleen removed.  Do not take good care of your teeth and mouth (poor dental hygiene).  Have a medical condition that increases the risk of breathing in droplets from your own mouth and nose.  Have a weakened body defense system (immune system).  Are a smoker.  Travel to areas where the germs that cause this illness are common.  Are around certain animals or the places they live. What are the signs or symptoms?  A dry cough.  A wet (productive) cough.  Fever.  Sweating.  Chest pain. This often happens when breathing deeply or coughing.  Fast breathing or trouble breathing.  Shortness of breath.  Shaking chills.  Feeling tired (fatigue).  Muscle aches. How is this treated? Treatment for this condition depends on many things. Most adults can be treated at home. In some cases, treatment must happen in a hospital. Treatment may include:  Medicines given by mouth or through an IV tube.  Being given extra oxygen.  Respiratory therapy. In rare cases, treatment for very bad pneumonia  may include:  Using a machine to help you breathe.  Having a procedure to remove fluid from around your lungs. Follow these instructions at home: Medicines  Take over-the-counter and prescription medicines only as told by your doctor. ? Only take cough medicine if you are losing sleep.  If you were prescribed an antibiotic medicine, take it as told by your doctor. Do not stop taking the antibiotic even if you start to feel better. General instructions   Sleep with your head and neck raised (elevated). You can do this by sleeping in a recliner or by putting a few pillows under your head.  Rest as needed. Get at least 8 hours of sleep each night.  Drink enough water to keep your pee (urine) pale yellow.  Eat a healthy diet that includes plenty of vegetables, fruits, whole grains, low-fat dairy products, and lean protein.  Do not use any products that contain nicotine or tobacco. These include cigarettes, e-cigarettes, and chewing tobacco. If you need help quitting, ask your doctor.  Keep all follow-up visits as told by your doctor. This is important. How is this prevented? A shot (vaccine) can help prevent pneumonia. Shots are often suggested for:  People older than 35 years of age.  People older than 35 years of age who: ? Are having cancer treatment. ? Have long-term (chronic) lung disease. ? Have problems with their body's defense system. You may also prevent pneumonia if you take these actions:  Get the flu (influenza) shot every year.  Go to the dentist as   often as told.  Wash your hands often. If you cannot use soap and water, use hand sanitizer. Contact a doctor if:  You have a fever.  You lose sleep because your cough medicine does not help. Get help right away if:  You are short of breath and it gets worse.  You have more chest pain.  Your sickness gets worse. This is very serious if: ? You are an older adult. ? Your body's defense system is weak.  You  cough up blood. Summary  Pneumonia is an infection of the lungs.  Most adults can be treated at home. Some will need treatment in a hospital.  Drink enough water to keep your pee pale yellow.  Get at least 8 hours of sleep each night. This information is not intended to replace advice given to you by your health care provider. Make sure you discuss any questions you have with your health care provider. Document Revised: 08/26/2018 Document Reviewed: 01/01/2018 Elsevier Patient Education  2020 [employer]  

## 2019-10-27 NOTE — Discharge Summary (Signed)
Physician Discharge Summary  Shannon Knapp [id number]:[id number] DOB: 07/28/1985 DOA: 10/25/2019  PCP: Patient, No Pcp Per  Admit date: 10/25/2019 Discharge date: 10/27/2019  Admitted From: Home Disposition: Home  Recommendations for Outpatient Follow-up:  1. Follow up with PCP in 1-2 weeks 2. Please obtain BMP/CBC in one week 3. Please follow up with your PCP on the following pending results: Unresulted Labs (From admission, onward)    Start     Ordered   10/28/19 0500  CBC with Differential/Platelet  Tomorrow morning,   R     10/27/19 1301   10/28/19 4481  Basic metabolic panel  Tomorrow morning,   R     10/27/19 1301   10/26/19 0818  Legionella Pneumophila Serogp 1 Ur Ag  Once,   STAT     10/26/19 Ada: None Equipment/Devices: None  Discharge Condition: Stable CODE STATUS: Full code Diet recommendation: Regular  Subjective: Patient seen and examined earlier today.  He complained of some shortness of breath however patient had not walked beyond his bathroom.  Had some cough.  Afebrile and no other complaint.  Brief/Interim Summary:  Shannon Knapp is a 34 y.o. male with medical history significant of severe asthma initially was seen at [facility] 3 days ago with shortness of breath, he was diagnosed with left lower lobe pneumonia on chest x-ray.  He was already on doxycycline and this time he was discharged on azithromycin.  Due to worsening symptoms, he presented to [facility] ED in the evening of 10/25/2019 and was admitted under hospitalist service.  Chest x-ray revealed worsening of the left lower lobe pneumonia with leukocytosis of 16.  He was tested negative for COVID-19 pneumonia.  He was started on vancomycin and Levaquin.  He was also diagnosed with acute asthma exacerbation for which he was started on IV Solu-Medrol.  When seen this morning, patient complained of having shortness of breath more than 24 hours ago in the ED main he walked however in last 24  hours, he has walked up to his bathroom only and has not felt any shortness of breath.  Patient has not required any oxygen since admission and has remained afebrile.  He had mildly elevated leukocytosis but he has been on Solu-Medrol which is likely the cause of leukocytosis.  Patient's urine antigen for streptococci is negative.  Legionella antigen is still pending.  He had elevated procalcitonin of 0.59.  Normal ESR but CRP 5.0.  He was tested negative for HIV.  He was also getting antifungals for his oral thrush.  When seen this morning, he had moderate bilateral expiratory wheezes and bilateral rhonchi but again he was afebrile and not hypoxic.  He was offered discharge to home since he is young gentleman without any hypoxia however he was apprehensive and per his request, plan was made to keep him in the hospital overnight and continue current management.  He had several questions in the morning which were answered to the best of his satisfaction.  Few hours later, his wife arrived at the bedside.  I received a message from the RN that she would like to talk to me.  I called patient's room and his wife picked up.  Right from the start, she was very upset and demanded that I see her in person and that she would not talk to me over the phone.  In an effort to find out what actually was upsetting her, I  tried to talk more on the phone and she threatened to complaint and hung up the phone.  I then went at the patient bedside and accompanied patient's primary RN with me.  I explained to the patient's wife and patient how much deeply I care about all my patients including her husband and that is the reason I went to see patient and meet patient's wife face-to-face per her demand.  Apparently, patient's wife had driven all the way from [place] to see her husband and she told me that she had to leave her mom's funeral in the middle.  She seemed very labile and frustrated.  She had asked several questions related to  patient's care that he had received 2 years ago in the hospital, his care that he has received from his pulmonary in last 2 years and his care that he had received here in last 2 days.  She had questions about his elevated LFTs.  These were not documented in the previous hospitalist note.  I recommended testing for acute viral hepatitis panel and ultrasound liver.  I answered all the questions that she had regarding his care that he has received in the hospital during these 2 days although I assumed patient's care only at 7 AM this morning.  I apologized for not being able to answer questions regarding why this or why that happened to the patient in the last 2 years as I was not the involved physician.  Nothing seemed to have satisfied the wife.  in short, at the end of the conversation, she stormed out the door saying " discharge him right now and I am taking him to [facility]".  After she left the room, I asked the patient if he would like to be discharged.  He asked not to discharge until he talks with her.  I later received a message from the nurse that they have requested a discharge.  Patient is being discharged in a stable condition although slightly elevated leukocytosis which is likely secondary to Solu-Medrol.  This morning, his Solu-Medrol was increased from 40 twice daily to 60 mg twice daily.  I am discharging this patient on Levaquin 750 mg p.o. daily for 5 days and prednisone 50 mg p.o. daily for 4 days.   Discharge Diagnoses:  Principal Problem:   Community acquired pneumonia of left lower lobe of lung Active Problems:   Asthma exacerbation   Severe persistent asthma    Discharge Instructions   Allergies as of 10/27/2019      Reactions   Amoxicillin Hives   Hives all over the body. 2009 [place]. Has patient had a PCN reaction causing immediate rash, facial/tongue/throat swelling, SOB or lightheadedness with hypotension: Yes Has patient had a PCN reaction causing severe rash  involving mucus membranes or skin necrosis: No Has patient had a PCN reaction that required hospitalization: No Has patient had a PCN reaction occurring within the last 10 years: Yes If all of the above answers are "NO", then may proceed with Cephalosporin use.   Fish Allergy Swelling   Shellfish Allergy Swelling   Penicillins Hives   Has patient had a PCN reaction causing immediate rash, facial/tongue/throat swelling, SOB or lightheadedness with hypotension: Yes Has patient had a PCN reaction causing severe rash involving mucus membranes or skin necrosis: No Has patient had a PCN reaction that required hospitalization: No Has patient had a PCN reaction occurring within the last 10 years: Yes If all of the above answers are "NO",  then may proceed with Cephalosporin use.      Medication List    STOP taking these medications   azithromycin 250 MG tablet Commonly known as: Zithromax Z-Pak   doxycycline 100 MG tablet Commonly known as: VIBRA-TABS     TAKE these medications   albuterol (2.5 MG/3ML) 0.083% nebulizer solution Commonly known as: PROVENTIL Take 3 mLs (2.5 mg total) by nebulization every 4 (four) hours as needed for up to 30 days for wheezing or shortness of breath. Use twice daily and as needed for coughing   albuterol 108 (90 Base) MCG/ACT inhaler Commonly known as: VENTOLIN HFA Inhale 2 puffs into the lungs every 6 (six) hours as needed for wheezing or shortness of breath.   albuterol 108 (90 Base) MCG/ACT inhaler Commonly known as: VENTOLIN HFA Inhale 2 puffs into the lungs every 6 (six) hours as needed for shortness of breath.   ARTHRITIS STRENGTH BC POWDER PO Take 1 Package by mouth in the morning and at bedtime.   benzonatate 100 MG capsule Commonly known as: TESSALON Take 1 capsule (100 mg total) by mouth 3 (three) times daily.   clotrimazole 10 MG troche Commonly known as: MYCELEX Take 1 tablet (10 mg total) by mouth 5 (five) times daily for 7 days.    diphenhydrAMINE 25 MG tablet Commonly known as: BENADRYL Take 25 mg by mouth 2 (two) times daily as needed for allergies.   GUAIFENESIN 1200 PO Take 1 tablet by mouth daily.   levofloxacin 750 MG tablet Commonly known as: Levaquin Take 1 tablet (750 mg total) by mouth daily for 5 days.   montelukast 10 MG tablet Commonly known as: SINGULAIR Take 1 tablet (10 mg total) by mouth at bedtime.   predniSONE 50 MG tablet Commonly known as: DELTASONE Take 1 tablet (50 mg total) by mouth daily with breakfast for 4 days. What changed:   medication strength  how much to take  how to take this  when to take this  additional instructions   Trelegy Ellipta 200-62.5-25 MCG/INH Aepb Generic drug: Fluticasone-Umeclidin-Vilant Inhale 1 puff into the lungs daily.   Trelegy Ellipta 200-62.5-25 MCG/INH Aepb Generic drug: Fluticasone-Umeclidin-Vilant Inhale 1 puff into the lungs daily.      Follow-up Information    [facility] Follow up.   Specialty: Primary Care Contact information: [place] [address] [phone]         Allergies  Allergen Reactions  . Amoxicillin Hives    Hives all over the body. 2009 [place]. Has patient had a PCN reaction causing immediate rash, facial/tongue/throat swelling, SOB or lightheadedness with hypotension: Yes Has patient had a PCN reaction causing severe rash involving mucus membranes or skin necrosis: No Has patient had a PCN reaction that required hospitalization: No Has patient had a PCN reaction occurring within the last 10 years: Yes If all of the above answers are "NO", then may proceed with Cephalosporin use.  . Fish Allergy Swelling  . Shellfish Allergy Swelling  . Penicillins Hives    Has patient had a PCN reaction causing immediate rash, facial/tongue/throat swelling, SOB or lightheadedness with hypotension: Yes Has patient had a PCN reaction causing  severe rash involving mucus membranes or skin necrosis: No Has patient had a PCN reaction that required hospitalization: No Has patient had a PCN reaction occurring within the last 10 years: Yes If all of the above answers are "NO", then may proceed with Cephalosporin use.  Consultations: None   Procedures/Studies: DG Chest 2 View  Result Date: 10/25/2019 CLINICAL DATA:  Cough and weakness for 1 day EXAM: CHEST - 2 VIEW COMPARISON:  10/22/2019 FINDINGS: Cardiac shadow is stable. The lungs are again hyperinflated. Increasing left lower lobe infiltrate is noted. No sizable effusion is noted. Some mild right basilar infiltrate is seen as well. No bony abnormality is noted. IMPRESSION: Significant increase in left lower lobe pneumonia when compared with the prior exam. Electronically Signed   By: Inez Catalina M.D.   On: 10/25/2019 15:41   DG Chest 2 View  Result Date: 10/22/2019 CLINICAL DATA:  Cough, shortness of breath. EXAM: CHEST - 2 VIEW COMPARISON:  October 20, 2019. FINDINGS: The heart size and mediastinal contours are within normal limits. No pneumothorax or pleural effusion is noted. New left basilar opacity is noted concerning for pneumonia. Right lung is unremarkable. The visualized skeletal structures are unremarkable. IMPRESSION: New left basilar pneumonia. Electronically Signed   By: Marijo Conception M.D.   On: 10/22/2019 13:37   DG Chest 2 View  Result Date: 10/20/2019 CLINICAL DATA:  Dyspnea EXAM: CHEST - 2 VIEW COMPARISON:  10/29/2018 FINDINGS: The heart size and mediastinal contours are within normal limits. Hazy airspace opacity in the right mid lung. The left lung appears clear. No pleural effusion. No pneumothorax. The visualized skeletal structures are unremarkable. IMPRESSION: Hazy airspace opacity in the right mid lung suspicious for pneumonia. Electronically Signed   By: Davina Poke D.O.   On: 10/20/2019 12:35      Discharge Exam: Vitals:   10/27/19 1123 10/27/19 1226   BP:  127/87  Pulse:  95  Resp:  16  Temp:  97.9 F (36.6 C)  SpO2: 97% 96%   Vitals:   10/27/19 0604 10/27/19 0855 10/27/19 1123 10/27/19 1226  BP: 137/85   127/87  Pulse: 80   95  Resp: 18   16  Temp: 98.5 F (36.9 C)   97.9 F (36.6 C)  TempSrc:    Oral  SpO2: 94% 96% 97% 96%  Weight:      Height:        General: Pt is alert, awake, not in acute distress Cardiovascular: Bilateral expiratory wheezes and scattered rhonchi Respiratory: CTA bilaterally, no wheezing, no rhonchi Abdominal: Soft, NT, ND, bowel sounds + Extremities: no edema, no cyanosis    The results of significant diagnostics from this hospitalization (including imaging, microbiology, ancillary and laboratory) are listed below for reference.     Microbiology: Recent Results (from the past 240 hour(s))  SARS Coronavirus 2 by RT PCR (hospital order, performed in [facility] hospital lab) Nasopharyngeal Nasopharyngeal Swab     Status: None   Collection Time: 10/22/19  3:31 PM   Specimen: Nasopharyngeal Swab  Result Value Ref Range Status   SARS Coronavirus 2 NEGATIVE NEGATIVE Final    Comment: (NOTE) SARS-CoV-2 target nucleic acids are NOT DETECTED. The SARS-CoV-2 RNA is generally detectable in upper and lower respiratory specimens during the acute phase of infection. The lowest concentration of SARS-CoV-2 viral copies this assay can detect is 250 copies / mL. A negative result does not preclude SARS-CoV-2 infection and should not be used as the sole basis for treatment or other patient management decisions.  A negative result may occur with improper specimen collection / handling, submission of specimen other than nasopharyngeal swab, presence of viral mutation(s) within the areas targeted by this assay, and inadequate number of viral copies (<250 copies / mL). A  negative result must be combined with clinical observations, patient history, and epidemiological information. Fact Sheet for Patients:    StrictlyIdeas.no Fact Sheet for Healthcare Providers: BankingDealers.co.za This test is not yet approved or cleared  by the [place] FDA and has been authorized for detection and/or diagnosis of SARS-CoV-2 by FDA under an Emergency Use Authorization (EUA).  This EUA will remain in effect (meaning this test can be used) for the duration of the COVID-19 declaration under Section 564(b)(1) of the Act, 21 U.S.C. section 360bbb-3(b)(1), unless the authorization is terminated or revoked sooner. Performed at [facility], [address] [place], [place] [address]   SARS Coronavirus 2 by RT PCR (hospital order, performed in [facility] hospital lab) Nasopharyngeal Nasopharyngeal Swab     Status: None   Collection Time: 10/25/19 10:07 PM   Specimen: Nasopharyngeal Swab  Result Value Ref Range Status   SARS Coronavirus 2 NEGATIVE NEGATIVE Final    Comment: (NOTE) SARS-CoV-2 target nucleic acids are NOT DETECTED. The SARS-CoV-2 RNA is generally detectable in upper and lower respiratory specimens during the acute phase of infection. The lowest concentration of SARS-CoV-2 viral copies this assay can detect is 250 copies / mL. A negative result does not preclude SARS-CoV-2 infection and should not be used as the sole basis for treatment or other patient management decisions.  A negative result may occur with improper specimen collection / handling, submission of specimen other than nasopharyngeal swab, presence of viral mutation(s) within the areas targeted by this assay, and inadequate number of viral copies (<250 copies / mL). A negative result must be combined with clinical observations, patient history, and epidemiological information. Fact Sheet for Patients:   StrictlyIdeas.no Fact Sheet for Healthcare Providers: BankingDealers.co.za This test is not yet approved or cleared  by the  [place] FDA and has been authorized for detection and/or diagnosis of SARS-CoV-2 by FDA under an Emergency Use Authorization (EUA).  This EUA will remain in effect (meaning this test can be used) for the duration of the COVID-19 declaration under Section 564(b)(1) of the Act, 21 U.S.C. section 360bbb-3(b)(1), unless the authorization is terminated or revoked sooner. Performed at [facility], [place] [address] [place], [place] [address]   MRSA PCR Screening     Status: None   Collection Time: 10/27/19 11:58 AM   Specimen: Nasal Mucosa; Nasopharyngeal  Result Value Ref Range Status   MRSA by PCR NEGATIVE NEGATIVE Final    Comment:        The GeneXpert MRSA Assay (FDA approved for NASAL specimens only), is one component of a comprehensive MRSA colonization surveillance program. It is not intended to diagnose MRSA infection nor to guide or monitor treatment for MRSA infections. Performed at [facility], [place] [address], [place], [place] [address]      Labs: BNP (last 3 results) No results for input(s): BNP in the last 8760 hours. Basic Metabolic Panel: Recent Labs  Lab 10/22/19 1223 10/25/19 1401 10/26/19 0528 10/26/19 0923 10/27/19 0608  NA 138 136 130* 131* 134*  K 3.3* 4.3 5.7* 4.9 4.4  CL 101 104 99 98 103  CO2 _0 GLUCOSE 95 85 130* 131* 116*  BUN _1 CREATININE 0.93 1.02 0.95 0.95 0.88  CALCIUM 9.1 8.7* 8.2* 8.5* 8.7*  MG  --   --   --  2.0  --    Liver Function Tests: Recent Labs  Lab 10/25/19 1401 10/26/19  0923 10/27/19 0608  AST 62* 89* 61*  ALT 77* 140* 131*  ALKPHOS 89 90 83  BILITOT 0.9 1.0 0.6  PROT 7.2 7.2 6.6  ALBUMIN 3.0* 2.8* 2.6*   No results for input(s): LIPASE, AMYLASE in the last 168 hours. No results for input(s): AMMONIA in the last 168 hours. CBC: Recent Labs  Lab 10/22/19 1223 10/25/19 1401 10/26/19 0528 10/27/19 0608  WBC 11.9* 16.9* 16.0* 21.9*  NEUTROABS  --  9.9*  --  16.4*  HGB  16.4 17.1* 15.9 14.3  HCT 47.6 50.7 46.9 41.4  MCV 92.2 94.4 93.2 92.0  PLT 297 233 224 220   Cardiac Enzymes: No results for input(s): CKTOTAL, CKMB, CKMBINDEX, TROPONINI in the last 168 hours. BNP: Invalid input(s): POCBNP CBG: No results for input(s): GLUCAP in the last 168 hours. D-Dimer No results for input(s): DDIMER in the last 72 hours. Hgb A1c No results for input(s): HGBA1C in the last 72 hours. Lipid Profile No results for input(s): CHOL, HDL, LDLCALC, TRIG, CHOLHDL, LDLDIRECT in the last 72 hours. Thyroid function studies No results for input(s): TSH, T4TOTAL, T3FREE, THYROIDAB in the last 72 hours.  Invalid input(s): FREET3 Anemia work up No results for input(s): VITAMINB12, FOLATE, FERRITIN, TIBC, IRON, RETICCTPCT in the last 72 hours. Urinalysis    Component Value Date/Time   COLORURINE STRAW (A) 10/18/2017 0751   APPEARANCEUR CLEAR 10/18/2017 0751   LABSPEC 1.006 10/18/2017 0751   PHURINE 6.0 10/18/2017 0751   GLUCOSEU 50 (A) 10/18/2017 0751   HGBUR NEGATIVE 10/18/2017 0751   BILIRUBINUR NEGATIVE 10/18/2017 0751   KETONESUR NEGATIVE 10/18/2017 0751   PROTEINUR NEGATIVE 10/18/2017 0751   NITRITE NEGATIVE 10/18/2017 0751   LEUKOCYTESUR NEGATIVE 10/18/2017 0751   Sepsis Labs Invalid input(s): PROCALCITONIN,  WBC,  LACTICIDVEN Microbiology Recent Results (from the past 240 hour(s))  SARS Coronavirus 2 by RT PCR (hospital order, performed in [facility] hospital lab) Nasopharyngeal Nasopharyngeal Swab     Status: None   Collection Time: 10/22/19  3:31 PM   Specimen: Nasopharyngeal Swab  Result Value Ref Range Status   SARS Coronavirus 2 NEGATIVE NEGATIVE Final    Comment: (NOTE) SARS-CoV-2 target nucleic acids are NOT DETECTED. The SARS-CoV-2 RNA is generally detectable in upper and lower respiratory specimens during the acute phase of infection. The lowest concentration of SARS-CoV-2 viral copies this assay can detect is 250 copies / mL. A negative  result does not preclude SARS-CoV-2 infection and should not be used as the sole basis for treatment or other patient management decisions.  A negative result may occur with improper specimen collection / handling, submission of specimen other than nasopharyngeal swab, presence of viral mutation(s) within the areas targeted by this assay, and inadequate number of viral copies (<250 copies / mL). A negative result must be combined with clinical observations, patient history, and epidemiological information. Fact Sheet for Patients:   StrictlyIdeas.no Fact Sheet for Healthcare Providers: BankingDealers.co.za This test is not yet approved or cleared  by the [place] FDA and has been authorized for detection and/or diagnosis of SARS-CoV-2 by FDA under an Emergency Use Authorization (EUA).  This EUA will remain in effect (meaning this test can be used) for the duration of the COVID-19 declaration under Section 564(b)(1) of the Act, 21 U.S.C. section 360bbb-3(b)(1), unless the authorization is terminated or revoked sooner. Performed at [facility], [place] [place], [place] [address]   SARS Coronavirus 2 by RT PCR (hospital order, performed in [facility]  lab) Nasopharyngeal Nasopharyngeal Swab     Status: None   Collection Time: 10/25/19 10:07 PM   Specimen: Nasopharyngeal Swab  Result Value Ref Range Status   SARS Coronavirus 2 NEGATIVE NEGATIVE Final    Comment: (NOTE) SARS-CoV-2 target nucleic acids are NOT DETECTED. The SARS-CoV-2 RNA is generally detectable in upper and lower respiratory specimens during the acute phase of infection. The lowest concentration of SARS-CoV-2 viral copies this assay can detect is 250 copies / mL. A negative result does not preclude SARS-CoV-2 infection and should not be used as the sole basis for treatment or other patient management decisions.  A negative result may occur  with improper specimen collection / handling, submission of specimen other than nasopharyngeal swab, presence of viral mutation(s) within the areas targeted by this assay, and inadequate number of viral copies (<250 copies / mL). A negative result must be combined with clinical observations, patient history, and epidemiological information. Fact Sheet for Patients:   StrictlyIdeas.no Fact Sheet for Healthcare Providers: BankingDealers.co.za This test is not yet approved or cleared  by the [place] FDA and has been authorized for detection and/or diagnosis of SARS-CoV-2 by FDA under an Emergency Use Authorization (EUA).  This EUA will remain in effect (meaning this test can be used) for the duration of the COVID-19 declaration under Section 564(b)(1) of the Act, 21 U.S.C. section 360bbb-3(b)(1), unless the authorization is terminated or revoked sooner. Performed at [facility], [place] [address] [place], [place] [address]   MRSA PCR Screening     Status: None   Collection Time: 10/27/19 11:58 AM   Specimen: Nasal Mucosa; Nasopharyngeal  Result Value Ref Range Status   MRSA by PCR NEGATIVE NEGATIVE Final    Comment:        The GeneXpert MRSA Assay (FDA approved for NASAL specimens only), is one component of a comprehensive MRSA colonization surveillance program. It is not intended to diagnose MRSA infection nor to guide or monitor treatment for MRSA infections. Performed at [facility], [place] [address], [place], [place] [address]      Time coordinating discharge: Over 30 minutes  SIGNED:   Darliss Cheney, MD  [facility] 10/27/2019, 2:34 PM  If 7PM-7AM, please contact night-coverage [email or web address]

## 2019-10-27 NOTE — Care Management (Signed)
Patient discharged. Prescriptions filled by [facility] and NCM provided $14 petty cash. Open Clinic information provided on AVS.   Shannon Knapp

## 2019-10-27 NOTE — Progress Notes (Signed)
[facility] Progress Note  Patient: Shannon Knapp    UKG:[id number]  DOA: 10/25/2019     Date of Service: the patient was seen and examined on 10/27/2019  Chief Complaint  Patient presents with  . Shortness of Breath  . Weakness   Brief hospital course: Patient with past medical history of asthma.  Admitted with community-acquired pneumonia and asthma exacerbation Currently plan is continue nebulizer and antibiotics and steroids.  Assessment and Plan: 1.  Left lower lobe community-acquired pneumonia Acute asthma exacerbation Covid negative. Seen outpatient initially was started on azithromycin without any improvement. Comes to the ER with worsening shortness of breath. Not hypoxic but significantly tight wheezing on examination.  Patient still complains of some shortness of breath however he remains normoxic without requiring any oxygen.  On exam, he has expiratory wheezes and some rhonchi bilaterally.  In my opinion, him being young patient, he can go home.  Patient apprehensive about going home since he had to come to the ER twice and demanded to stay in the hospital overnight.  I will increase his Solu-Medrol to 60 mg IV twice daily and continue current Levaquin.  Diet: Regular diet DVT Prophylaxis: Subcutaneous Heparin    Advance goals of care discussion: Full code  Family Communication: no family was present at bedside, at the time of interview.   Disposition:  Status is: Inpatient  Remains inpatient appropriate because:IV treatments appropriate due to intensity of illness or inability to take PO   Dispo: The patient is from: Home              Anticipated d/c is to: Home              Anticipated d/c date is: 2 days              Patient currently is not medically stable to d/c.        Subjective: Seen and examined.  Complains of some shortness of breath however he has not even walked beyond going to the bathroom and while going to the bathroom, he has not had  any shortness of breath.  He is coughing constantly.  Denies any fever.  Not comfortable going home today.  Physical Exam:  General exam: Appears calm and comfortable  Respiratory system: Expiratory wheezes with bilateral rhonchi. Respiratory effort normal. Cardiovascular system: S1 & S2 heard, RRR. No JVD, murmurs, rubs, gallops or clicks. No pedal edema. Gastrointestinal system: Abdomen is nondistended, soft and nontender. No organomegaly or masses felt. Normal bowel sounds heard. Central nervous system: Alert and oriented. No focal neurological deficits. Extremities: Symmetric 5 x 5 power. Skin: No rashes, lesions or ulcers.  Psychiatry: Judgement and insight appear normal. Mood & affect appropriate.    Vitals:   10/27/19 0604 10/27/19 0855 10/27/19 1123 10/27/19 1226  BP: 137/85   127/87  Pulse: 80   95  Resp: 18   16  Temp: 98.5 F (36.9 C)   97.9 F (36.6 C)  TempSrc:    Oral  SpO2: 94% 96% 97% 96%  Weight:      Height:        Intake/Output Summary (Last 24 hours) at 10/27/2019 1257 Last data filed at 10/27/2019 0606 Gross per 24 hour  Intake 843.98 ml  Output 1050 ml  Net -206.02 ml   Filed Weights   10/25/19 1356  Weight: 75.8 kg    Data Reviewed: I have personally reviewed and interpreted daily labs, tele strips, imagings as discussed above. I reviewed  all nursing notes, pharmacy notes, vitals, pertinent old records I have discussed plan of care as described above with RN and patient/family.  CBC: Recent Labs  Lab 10/22/19 1223 10/25/19 1401 10/26/19 0528 10/27/19 0608  WBC 11.9* 16.9* 16.0* 21.9*  NEUTROABS  --  9.9*  --  16.4*  HGB 16.4 17.1* 15.9 14.3  HCT 47.6 50.7 46.9 41.4  MCV 92.2 94.4 93.2 92.0  PLT 297 233 224 263   Basic Metabolic Panel: Recent Labs  Lab 10/22/19 1223 10/25/19 1401 10/26/19 0528 10/26/19 0923 10/27/19 0608  NA 138 136 130* 131* 134*  K 3.3* 4.3 5.7* 4.9 4.4  CL 101 104 99 98 103  CO2 26 24 23 25 23   GLUCOSE 95 85  130* 131* 116*  BUN 9 12 11 9 11   CREATININE 0.93 1.02 0.95 0.95 0.88  CALCIUM 9.1 8.7* 8.2* 8.5* 8.7*  MG  --   --   --  2.0  --     Studies: No results found.  Scheduled Meds: . albuterol  2.5 mg Nebulization QID  . benzonatate  100 mg Oral TID  . clotrimazole  10 mg Oral 5 X Daily  . dextromethorphan-guaiFENesin  1 tablet Oral BID  . enoxaparin (LOVENOX) injection  40 mg Subcutaneous Q24H  . methylPREDNISolone (SOLU-MEDROL) injection  60 mg Intravenous Q12H  . montelukast  10 mg Oral QHS  . umeclidinium bromide  1 puff Inhalation Daily   Continuous Infusions: . levofloxacin (LEVAQUIN) IV 100 mL/hr at 10/27/19 0300  . vancomycin Stopped (10/26/19 1535)   PRN Meds: acetaminophen, albuterol, menthol-cetylpyridinium, ondansetron (ZOFRAN) IV  Time spent: 33 minutes  Author: Darliss Cheney, MD [facility] 10/27/2019 12:57 PM  To reach On-call, see care teams to locate the attending and reach out via [email or web address]. Between 7PM-7AM, please contact night-coverage If you still have difficulty reaching the attending provider, please page the [facility] (Director on Call) for [facility] on amion for assistance.

## 2019-10-27 NOTE — Progress Notes (Signed)
Pt given discharge summary and discharged via wife as transportation.  °

## 2019-10-28 LAB — LEGIONELLA PNEUMOPHILA SEROGP 1 UR AG: L. pneumophila Serogp 1 Ur Ag: NEGATIVE

## 2019-11-03 ENCOUNTER — Ambulatory Visit (INDEPENDENT_AMBULATORY_CARE_PROVIDER_SITE_OTHER): Payer: Self-pay | Admitting: Adult Health

## 2019-11-03 ENCOUNTER — Other Ambulatory Visit: Payer: Self-pay

## 2019-11-03 ENCOUNTER — Encounter: Payer: Self-pay | Admitting: Adult Health

## 2019-11-03 ENCOUNTER — Ambulatory Visit (INDEPENDENT_AMBULATORY_CARE_PROVIDER_SITE_OTHER): Payer: Self-pay

## 2019-11-03 VITALS — BP 126/82 | HR 68 | Ht 71.0 in | Wt 177.0 lb

## 2019-11-03 DIAGNOSIS — J4550 Severe persistent asthma, uncomplicated: Secondary | ICD-10-CM

## 2019-11-03 DIAGNOSIS — J301 Allergic rhinitis due to pollen: Secondary | ICD-10-CM

## 2019-11-03 DIAGNOSIS — J189 Pneumonia, unspecified organism: Secondary | ICD-10-CM

## 2019-11-03 LAB — CBC WITH DIFFERENTIAL/PLATELET
Basophils Absolute: 0.1 10*3/uL (ref 0.0–0.1)
Basophils Relative: 0.5 % (ref 0.0–3.0)
Eosinophils Absolute: 5.7 10*3/uL — ABNORMAL HIGH (ref 0.0–0.7)
Eosinophils Relative: 52.2 % — ABNORMAL HIGH (ref 0.0–5.0)
HCT: 40.8 % (ref 39.0–52.0)
Hemoglobin: 13.6 g/dL (ref 13.0–17.0)
Lymphocytes Relative: 27.4 % (ref 12.0–46.0)
Lymphs Abs: 3 10*3/uL (ref 0.7–4.0)
MCHC: 33.4 g/dL (ref 30.0–36.0)
MCV: 95.7 fl (ref 78.0–100.0)
Monocytes Absolute: 0.5 10*3/uL (ref 0.1–1.0)
Monocytes Relative: 5 % (ref 3.0–12.0)
Neutro Abs: 1.6 10*3/uL (ref 1.4–7.7)
Neutrophils Relative %: 14.9 % — ABNORMAL LOW (ref 43.0–77.0)
Platelets: 295 10*3/uL (ref 150.0–400.0)
RBC: 4.26 Mil/uL (ref 4.22–5.81)
RDW: 14.1 % (ref 11.5–15.5)
WBC: 11 10*3/uL — ABNORMAL HIGH (ref 4.0–10.5)

## 2019-11-03 LAB — HEPATIC FUNCTION PANEL
ALT: 77 U/L — ABNORMAL HIGH (ref 0–53)
AST: 43 U/L — ABNORMAL HIGH (ref 0–37)
Albumin: 3.2 g/dL — ABNORMAL LOW (ref 3.5–5.2)
Alkaline Phosphatase: 66 U/L (ref 39–117)
Bilirubin, Direct: 0.1 mg/dL (ref 0.0–0.3)
Total Bilirubin: 0.3 mg/dL (ref 0.2–1.2)
Total Protein: 6.2 g/dL (ref 6.0–8.3)

## 2019-11-03 MED ORDER — TRELEGY ELLIPTA 200-62.5-25 MCG/INH IN AEPB: 1.0000 | INHALATION_SPRAY | Freq: Every day | RESPIRATORY_TRACT | 0 refills | Status: DC

## 2019-11-03 MED ORDER — MOMETASONE FURO-FORMOTEROL FUM 200-5 MCG/ACT IN AERO
2.0000 | INHALATION_SPRAY | Freq: Two times a day (BID) | RESPIRATORY_TRACT | 5 refills | Status: DC
Start: 2019-11-03 — End: 2019-11-09

## 2019-11-03 MED ORDER — ALBUTEROL SULFATE (2.5 MG/3ML) 0.083% IN NEBU: 2.5000 mg | INHALATION_SOLUTION | RESPIRATORY_TRACT | 2 refills | Status: DC | PRN

## 2019-11-03 MED ORDER — MONTELUKAST SODIUM 10 MG PO TABS: 10.0000 mg | ORAL_TABLET | Freq: Every day | ORAL | 5 refills | Status: DC

## 2019-11-03 NOTE — Assessment & Plan Note (Addendum)
Severe persistent asthma with a highly allergic component with elevated eosinophils and IgE. We will check Aspergillus panel. Check allergy profile.  Refer to allergist. Add Zyrtec.  Continue on Singulair.  Will discontinue Trelegy and restart Dulera. 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  Restart Dulera 2 puffs Twice daily   Add Zyrtec 10mg  At bedtime  .  Refer to allergist.  Albuterol inhaler or Neb As needed   Asthma action plan as discussed .   Follow up with Dr. in 3-4 weeks and As needed   Please contact office for sooner follow up if symptoms do not improve or worsen or seek emergency care

## 2019-11-03 NOTE — Progress Notes (Signed)
@Patient  ID: , male    DOB: 1985-10-27, 34 y.o.   MRN: [id number]  Chief Complaint  Patient presents with  . Follow-up    Asthma     Referring provider: No ref. provider found  HPI: 34 year old male former smoker followed for severe persistent asthma with elevated IgE and eosinophils Patient is prone to recurrent exacerbations with hospitalizations  TEST/EVENTS :  Hospitalized twice in 2019 for status asthmaticus.  Had bronchoscopy in June 2019 for intermittent hemoptysis without acute abnormality.  CT chest 10/22/2017-bronchial wall thickening, dependent atelectasis in the lower lobe. CT abdomen pelvis 10/27/2017-no acute intra-abdominal process. Chest x-ray 11/28/2017-hyperinflated lungs, no acute abnormality I have reviewed the images personally.  PFTs 10/30/2017 FVC 5.81 [120%], FEV1 2.38 [61%], F/F 41, TLC 166%, RV/TLC 221%, DLCO 104%, Poor patient effort, Patient did not want albuterol Moderate obstructive airway disease with overinflation, trapping. Normal diffusion defect.  FENO  10/17/2017-95 12/02/2017-54  ACQ6 03/16/18- 3  Labs CBC 11/28/2017-WBC 10.1, eos 14%, absolute eosinophil count 1400 IgE 10/22/2017-2209  Pets: No pets, birds, farm animals Occupation: Works as a 12/22/2017 for [employer], currently unemployed.No Insurance  Exposures: No known exposures, no mold, hot tubs Smoking history: 5-pack-year smoking history. Smokes marijuana occasionally  Travel history: [place]ived in [place]. No other recent travel Relevant family history: No significant family history of lung disease.  11/03/2019 Follow up : Asthma and PNA  Patient presents for a 2-week follow-up.  Patient was seen last visit with an acute asthma exacerbation.  And right sided pneumonia.  Patient was treated with doxycycline.  Unfortunately patient was unable to tolerate doxycycline (rash) .  And had progressively worsening shortness of breath and was admitted  to the hospital.  On admission chest x-ray showed a left basilar pneumonia.  Lab work was negative for Legionella, strep pneumonia, HIV and COVID-19.  Lab work showed elevated white blood cell count at 21,000, elevated LFTs.  Patient has a known high IgE level.  Most recent IgE was elevated at 1550 in June 2020.July 2020 Since discharge patient says he is feeling better.  He has decreased cough congestion and wheezing. Chest x-ray today shows clearing of pnuemonia .  Unfortunately patient does not have insurance.  Last office visit patient was referred to community health and wellness, Trelegy inhaler patient assistance paperwork was completed.  Patient was also given information on good Rx to help with medication cost. He says he wants to go back to [facility], felt worse on Trelegy . Worried it caused a pneumonia .  Patient says he has been established with med management in [place] to help with his prescriptions.  And wants his prescription sent there.  We did discuss his high allergen levels and referral to an allergist. Patient was placed on a prednisone taper at discharge which he says he has finished.  Since discharge patient says he is feeling better has decreased cough and congestion.  Breathing has improved.  He is currently not taking any inhalers.  Says he is working with social services to get Medicaid  Patient's fiance was on video chat during visit with complaints with his care, tried to answer questions and discuss treatments however she got upset and hung up phone. Continued to discuss care with patient and treatment plan with patient.    Allergies  Allergen Reactions  . Amoxicillin Hives    Hives all over the body. 2009 [facility] 2010. Has patient had a PCN reaction causing immediate rash, facial/tongue/throat  swelling, SOB or lightheadedness with hypotension: Yes Has patient had a PCN reaction causing severe rash involving mucus membranes or skin necrosis: No Has patient had a PCN  reaction that required hospitalization: No Has patient had a PCN reaction occurring within the last 10 years: Yes If all of the above answers are "NO", then may proceed with Cephalosporin use.  Marland Kitchen Doxycycline   . Fish Allergy Swelling  . Shellfish Allergy Swelling  . Penicillins Hives    Has patient had a PCN reaction causing immediate rash, facial/tongue/throat swelling, SOB or lightheadedness with hypotension: Yes Has patient had a PCN reaction causing severe rash involving mucus membranes or skin necrosis: No Has patient had a PCN reaction that required hospitalization: No Has patient had a PCN reaction occurring within the last 10 years: Yes If all of the above answers are "NO", then may proceed with Cephalosporin use.    Immunization History  Administered Date(s) Administered  . Pneumococcal Polysaccharide-23 10/19/2017    Past Medical History:  Diagnosis Date  . Asthma   . Chronic bronchitis (HCC)   . COPD (chronic obstructive pulmonary disease) (HCC)     Tobacco History: Social History   Tobacco Use  Smoking Status Former Smoker  . Packs/day: 0.25  . Years: 14.00  . Pack years: 3.50  . Types: Cigarettes  . Start date: 2006  . Quit date: 08/2018  . Years since quitting: 1.2  Smokeless Tobacco Never Used   Counseling given: Not Answered   Outpatient Medications Prior to Visit  Medication Sig Dispense Refill  . albuterol (VENTOLIN HFA) 108 (90 Base) MCG/ACT inhaler Inhale 2 puffs into the lungs every 6 (six) hours as needed for wheezing or shortness of breath. 18 g 3  . albuterol (VENTOLIN HFA) 108 (90 Base) MCG/ACT inhaler Inhale 2 puffs into the lungs every 6 (six) hours as needed for shortness of breath. 6.7 g 0  . Aspirin-Salicylamide-Caffeine (ARTHRITIS STRENGTH BC POWDER PO) Take 1 Package by mouth in the morning and at bedtime.    . benzonatate (TESSALON) 100 MG capsule Take 1 capsule (100 mg total) by mouth 3 (three) times daily. 20 capsule 0  .  diphenhydrAMINE (BENADRYL) 25 MG tablet Take 25 mg by mouth 2 (two) times daily as needed for allergies.    Marland Kitchen GUAIFENESIN 1200 PO Take 1 tablet by mouth daily.    . Fluticasone-Umeclidin-Vilant (TRELEGY ELLIPTA) 200-62.5-25 MCG/INH AEPB Inhale 1 puff into the lungs daily. 1 each 0  . Fluticasone-Umeclidin-Vilant (TRELEGY ELLIPTA) 200-62.5-25 MCG/INH AEPB Inhale 1 puff into the lungs daily. 1 each 0  . montelukast (SINGULAIR) 10 MG tablet Take 1 tablet (10 mg total) by mouth at bedtime. 30 tablet 5  . albuterol (PROVENTIL) (2.5 MG/3ML) 0.083% nebulizer solution Take 3 mLs (2.5 mg total) by nebulization every 4 (four) hours as needed for up to 30 days for wheezing or shortness of breath. Use twice daily and as needed for coughing 75 mL 2   No facility-administered medications prior to visit.     Review of Systems:   Constitutional:   No  weight loss, night sweats,  Fevers, chills, fatigue, or  lassitude.  HEENT:   No headaches,  Difficulty swallowing,  Tooth/dental problems, or  Sore throat,                No sneezing, itching, ear ache, +nasal congestion, post nasal drip,   CV:  No chest pain,  Orthopnea, PND, swelling in lower extremities, anasarca, dizziness, palpitations, syncope.  GI  No heartburn, indigestion, abdominal pain, nausea, vomiting, diarrhea, change in bowel habits, loss of appetite, bloody stools.   Resp:    No chest wall deformity  Skin: no rash or lesions.  GU: no dysuria, change in color of urine, no urgency or frequency.  No flank pain, no hematuria   MS:  No joint pain or swelling.  No decreased range of motion.  No back pain.    Physical Exam  BP 126/82 (BP Location: Left Arm, Cuff Size: Normal)   Pulse 68   Ht 5\' 11"  (1.803 m)   Wt 177 lb (80.3 kg)   SpO2 100%   BMI 24.69 kg/m   GEN: A/Ox3; pleasant , NAD,    HEENT:  [place]/AT,  EACs-clear, TMs-wnl, NOSE-clear, THROAT-clear, no lesions, no postnasal drip or exudate noted.   NECK:  Supple w/ fair ROM;  no JVD; normal carotid impulses w/o bruits; no thyromegaly or nodules palpated; no lymphadenopathy.   No stridor.  RESP  Clear  P & A; w/o, wheezes/ rales/ or rhonchi. no accessory muscle use, no dullness to percussion Speaks in full sentences with no audible distress.  CARD:  RRR, no m/r/g, no peripheral edema, pulses intact, no cyanosis or clubbing.  GI:   Soft & nt; nml bowel sounds; no organomegaly or masses detected.   Musco: Warm bil, no deformities or joint swelling noted.   Neuro: alert, no focal deficits noted.    Skin: Warm, no lesions or rashes    Lab Results:  CBC    Component Value Date/Time   WBC 21.9 (H) 10/27/2019 0608   RBC 4.50 10/27/2019 0608   HGB 14.3 10/27/2019 0608   HCT 41.4 10/27/2019 0608   PLT 220 10/27/2019 0608   MCV 92.0 10/27/2019 0608   MCH 31.8 10/27/2019 0608   MCHC 34.5 10/27/2019 0608   RDW 12.8 10/27/2019 0608   LYMPHSABS 2.8 10/27/2019 0608   MONOABS 1.2 (H) 10/27/2019 0608   EOSABS 1.2 (H) 10/27/2019 0608   BASOSABS 0.1 10/27/2019 0608    BMET    Component Value Date/Time   NA 134 (L) 10/27/2019 0608   K 4.4 10/27/2019 0608   CL 103 10/27/2019 0608   CO2 23 10/27/2019 0608   GLUCOSE 116 (H) 10/27/2019 0608   BUN 11 10/27/2019 0608   CREATININE 0.88 10/27/2019 0608   CALCIUM 8.7 (L) 10/27/2019 0608   GFRNONAA >60 10/27/2019 0608   GFRAA >60 10/27/2019 0608    BNP No results found for: BNP  ProBNP No results found for: PROBNP  Imaging: DG Chest 2 View  Result Date: 11/03/2019 CLINICAL DATA:  Recent  pneumonia EXAM: CHEST - 2 VIEW COMPARISON:  October 25, 2019 FINDINGS: There has been clearing of infiltrate from the left base. Minimal atelectatic change remains in the left base. Lungs elsewhere are clear. Heart size and pulmonary vascularity are normal. No adenopathy. No bone lesions. IMPRESSION: Virtually complete clearing of infiltrate from the left base with small amount of residual atelectasis in this area. Lungs  elsewhere clear. Heart size normal. No adenopathy. Electronically Signed   By: October 27, 2019 III M.D.   On: 11/03/2019 09:46   DG Chest 2 View  Result Date: 10/25/2019 CLINICAL DATA:  Cough and weakness for 1 day EXAM: CHEST - 2 VIEW COMPARISON:  10/22/2019 FINDINGS: Cardiac shadow is stable. The lungs are again hyperinflated. Increasing left lower lobe infiltrate is noted. No sizable effusion is noted. Some mild right basilar infiltrate is seen as well.  No bony abnormality is noted. IMPRESSION: Significant increase in left lower lobe pneumonia when compared with the prior exam. Electronically Signed   By: Inez Catalina M.D.   On: 10/25/2019 15:41   DG Chest 2 View  Result Date: 10/22/2019 CLINICAL DATA:  Cough, shortness of breath. EXAM: CHEST - 2 VIEW COMPARISON:  October 20, 2019. FINDINGS: The heart size and mediastinal contours are within normal limits. No pneumothorax or pleural effusion is noted. New left basilar opacity is noted concerning for pneumonia. Right lung is unremarkable. The visualized skeletal structures are unremarkable. IMPRESSION: New left basilar pneumonia. Electronically Signed   By: Marijo Conception M.D.   On: 10/22/2019 13:37   DG Chest 2 View  Result Date: 10/20/2019 CLINICAL DATA:  Dyspnea EXAM: CHEST - 2 VIEW COMPARISON:  10/29/2018 FINDINGS: The heart size and mediastinal contours are within normal limits. Hazy airspace opacity in the right mid lung. The left lung appears clear. No pleural effusion. No pneumothorax. The visualized skeletal structures are unremarkable. IMPRESSION: Hazy airspace opacity in the right mid lung suspicious for pneumonia. Electronically Signed   By: Davina Poke D.O.   On: 10/20/2019 12:35      PFT Results Latest Ref Rng & Units 10/29/2017  FVC-Pre L 5.81  FVC-Predicted Pre % 124  Pre FEV1/FVC % % 41  FEV1-Pre L 2.38  FEV1-Predicted Pre % 61  DLCO UNC% % 104  DLCO COR %Predicted % 97  TLC L 11.87  TLC % Predicted % 166  RV % Predicted  % 360    Lab Results  Component Value Date   NITRICOXIDE 95 10/17/2017        Assessment & Plan:   Severe persistent asthma Severe persistent asthma with a highly allergic component with elevated eosinophils and IgE. We will check Aspergillus panel. Check allergy profile.  Refer to allergist. Add Zyrtec.  Continue on Singulair.  Will discontinue Trelegy and restart Dulera. 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  Restart Dulera 2 puffs Twice daily   Add Zyrtec 10mg  At bedtime  .  Refer to allergist.  Albuterol inhaler or Neb As needed   Asthma action plan as discussed .   Follow up with Dr. Patsey Berthold in 3-4 weeks and As needed   Please contact office for sooner follow up if symptoms do not improve or worsen or seek emergency care       Community acquired pneumonia of left lower lobe of lung Clinically improved, has completed full course of antibiotics.  Chest x-ray today shows clearance of left sided pneumonia. Pneumonia work-up was negative for Legionella and strep pneumonia.   Allergic rhinitis Add Zyrtec daily continue on Singulair    Total patient care time 51 minutes  Curley Hogen, NP 11/03/2019

## 2019-11-03 NOTE — Patient Instructions (Addendum)
Mucinex DM Twice daily  As needed  Cough /congestion  Stop TRELEGY .  Restart Dulera 2 puffs Twice daily   Add Zyrtec 10mg  At bedtime  .  Refer to allergist.  Albuterol inhaler or Neb As needed   Asthma action plan as discussed .   Follow up with Dr. in 3-4 weeks and As needed   Please contact office for sooner follow up if symptoms do not improve or worsen or seek emergency care

## 2019-11-03 NOTE — Assessment & Plan Note (Signed)
Clinically improved, has completed full course of antibiotics.  Chest x-ray today shows clearance of left sided pneumonia. Pneumonia work-up was negative for Legionella and strep pneumonia.

## 2019-11-03 NOTE — Assessment & Plan Note (Signed)
Add Zyrtec daily continue on Singulair

## 2019-11-04 LAB — RESPIRATORY ALLERGY PROFILE REGION II ~~LOC~~
Allergen, A. alternata, m6: 0.25 kU/L — ABNORMAL HIGH
Allergen, Cedar tree, t12: 0.26 kU/L — ABNORMAL HIGH
Allergen, Comm Silver Birch, t9: 0.31 kU/L — ABNORMAL HIGH
Allergen, Cottonwood, t14: 0.42 kU/L — ABNORMAL HIGH
Allergen, D pternoyssinus,d7: 0.38 kU/L — ABNORMAL HIGH
Allergen, Mouse Urine Protein, e78: 0.23 kU/L — ABNORMAL HIGH
Allergen, Mulberry, t76: 0.4 kU/L — ABNORMAL HIGH
Allergen, Oak,t7: 0.35 kU/L — ABNORMAL HIGH
Allergen, P. notatum, m1: 0.39 kU/L — ABNORMAL HIGH
Aspergillus fumigatus, m3: 0.4 kU/L — ABNORMAL HIGH
Bermuda Grass: 0.27 kU/L — ABNORMAL HIGH
Box Elder IgE: 0.34 kU/L — ABNORMAL HIGH
CLADOSPORIUM HERBARUM (M2) IGE: 0.2 kU/L — ABNORMAL HIGH
COMMON RAGWEED (SHORT) (W1) IGE: 0.3 kU/L — ABNORMAL HIGH
Cat Dander: 0.29 kU/L — ABNORMAL HIGH
Class: 1
Class: 1
Class: 1
Class: 1
Class: 1
Class: 1
Class: 1
Class: 1
Cockroach: 0.27 kU/L — ABNORMAL HIGH
D. farinae: 0.29 kU/L — ABNORMAL HIGH
Dog Dander: 0.41 kU/L — ABNORMAL HIGH
Elm IgE: 0.3 kU/L — ABNORMAL HIGH
IgE (Immunoglobulin E), Serum: 3840 kU/L — ABNORMAL HIGH (ref <=114)
Johnson Grass: 0.33 kU/L — ABNORMAL HIGH
Pecan/Hickory Tree IgE: 0.3 kU/L — ABNORMAL HIGH
Rough Pigweed  IgE: 0.3 kU/L — ABNORMAL HIGH
Sheep Sorrel IgE: 0.36 kU/L — ABNORMAL HIGH
Timothy Grass: 0.28 kU/L — ABNORMAL HIGH

## 2019-11-04 LAB — INTERPRETATION:

## 2019-11-04 NOTE — Progress Notes (Signed)
Agree with the details of the visit as noted below.  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  This in progress.

## 2019-11-05 ENCOUNTER — Other Ambulatory Visit: Payer: Self-pay

## 2019-11-05 ENCOUNTER — Telehealth: Payer: Self-pay | Admitting: Adult Health

## 2019-11-05 DIAGNOSIS — D7219 Other eosinophilia: Secondary | ICD-10-CM

## 2019-11-05 DIAGNOSIS — J4550 Severe persistent asthma, uncomplicated: Secondary | ICD-10-CM

## 2019-11-05 MED ORDER — ALBUTEROL SULFATE (2.5 MG/3ML) 0.083% IN NEBU: 2.5000 mg | INHALATION_SOLUTION | RESPIRATORY_TRACT | 2 refills | Status: DC | PRN

## 2019-11-05 NOTE — Telephone Encounter (Signed)
Shannon Sicks, NP  11/04/2019 3:06 PM EDT     IGE remains very high at 3840 Please order ANCA panel and have him return asap for labs  Refer to Hematology if not already done  Allergy panel is positive to every trigger.  Aspergillus panel is pending    Called and spoke with pt's fiance [name] letting her know the results of labwork stated by TP. [name] verbalized understanding. Stated to her that we could mail the labwork to her so they could have for reference and she stated she would. I have placed pt's labwork in mail for pt. Nothing further needed.

## 2019-11-08 ENCOUNTER — Other Ambulatory Visit: Payer: Self-pay

## 2019-11-08 ENCOUNTER — Telehealth: Payer: Self-pay | Admitting: Adult Health

## 2019-11-08 DIAGNOSIS — J4550 Severe persistent asthma, uncomplicated: Secondary | ICD-10-CM

## 2019-11-08 NOTE — Telephone Encounter (Signed)
Spoke to [place] with medication mgmt clinic. Lyla Son stated that Elwin Sleight is no longer covered under the assistance program. Alternatives are Advair, Breo or Symbicort.   Tammy, please advise. Thanks

## 2019-11-09 LAB — ANCA SCREEN W REFLEX TITER: ANCA Screen: NEGATIVE

## 2019-11-09 MED ORDER — BUDESONIDE-FORMOTEROL FUMARATE 160-4.5 MCG/ACT IN AERO: 2.0000 | INHALATION_SPRAY | Freq: Two times a day (BID) | RESPIRATORY_TRACT | 11 refills | Status: DC

## 2019-11-09 NOTE — Telephone Encounter (Signed)
Okay lets do Symbicort 160 2puffs Twice daily   Thanks so much

## 2019-11-09 NOTE — Telephone Encounter (Signed)
Lm for pt to make aware of change.

## 2019-11-09 NOTE — Telephone Encounter (Signed)
Pt is aware of change in medication. Rx for symbicort 160 has been sent to preferred pharmacy. Medication mgmt is aware. Nothing further is needed.

## 2019-11-10 LAB — ASPERGILLUS IGE PANEL
A. Amstel/Glaucu Class Interp: 0
A. Flavus Class Interp: 0
A. Fumigatus Class Interp: 1
A. Nidulans Class Interp: 0
A. Niger Class Interp: 2
Aspergillus amstel/glaucu IgE*: <0.35 kU/L (ref <0.35)
Aspergillus flavus IgE: <0.35 kU/L (ref <0.35)
Aspergillus fumigatus IgE: 0.45 kU/L — ABNORMAL HIGH (ref <0.35)
Aspergillus nidulans IgE: <0.35 kU/L (ref <0.35)
Aspergillus niger IgE: 0.96 kU/L — ABNORMAL HIGH (ref <0.35)
Aspergillus versicolor IgE: 0.21 kU/L (ref <0.35)

## 2019-11-12 ENCOUNTER — Inpatient Hospital Stay: Payer: Self-pay

## 2019-11-12 ENCOUNTER — Inpatient Hospital Stay: Payer: Self-pay | Attending: Oncology | Admitting: Oncology

## 2019-11-12 DIAGNOSIS — J449 Chronic obstructive pulmonary disease, unspecified: Secondary | ICD-10-CM | POA: Insufficient documentation

## 2019-11-12 DIAGNOSIS — Z7982 Long term (current) use of aspirin: Secondary | ICD-10-CM | POA: Insufficient documentation

## 2019-11-12 DIAGNOSIS — Z7951 Long term (current) use of inhaled steroids: Secondary | ICD-10-CM | POA: Insufficient documentation

## 2019-11-12 DIAGNOSIS — Z833 Family history of diabetes mellitus: Secondary | ICD-10-CM | POA: Insufficient documentation

## 2019-11-12 DIAGNOSIS — R7401 Elevation of levels of liver transaminase levels: Secondary | ICD-10-CM | POA: Insufficient documentation

## 2019-11-12 DIAGNOSIS — D721 Eosinophilia, unspecified: Secondary | ICD-10-CM | POA: Insufficient documentation

## 2019-11-12 DIAGNOSIS — Z825 Family history of asthma and other chronic lower respiratory diseases: Secondary | ICD-10-CM | POA: Insufficient documentation

## 2019-11-12 DIAGNOSIS — Z79899 Other long term (current) drug therapy: Secondary | ICD-10-CM | POA: Insufficient documentation

## 2019-11-12 DIAGNOSIS — Z8249 Family history of ischemic heart disease and other diseases of the circulatory system: Secondary | ICD-10-CM | POA: Insufficient documentation

## 2019-11-12 DIAGNOSIS — Z87891 Personal history of nicotine dependence: Secondary | ICD-10-CM | POA: Insufficient documentation

## 2019-11-16 ENCOUNTER — Inpatient Hospital Stay: Payer: Self-pay

## 2019-11-16 ENCOUNTER — Telehealth: Payer: Self-pay | Admitting: Adult Health

## 2019-11-16 ENCOUNTER — Encounter: Payer: Self-pay | Admitting: Oncology

## 2019-11-16 ENCOUNTER — Inpatient Hospital Stay (HOSPITAL_BASED_OUTPATIENT_CLINIC_OR_DEPARTMENT_OTHER): Payer: Self-pay | Admitting: Oncology

## 2019-11-16 ENCOUNTER — Encounter (INDEPENDENT_AMBULATORY_CARE_PROVIDER_SITE_OTHER): Payer: Self-pay

## 2019-11-16 ENCOUNTER — Other Ambulatory Visit: Payer: Self-pay

## 2019-11-16 VITALS — BP 129/88 | HR 86 | Temp 96.1°F | Resp 18 | Ht 71.0 in | Wt 163.0 lb

## 2019-11-16 DIAGNOSIS — Z5181 Encounter for therapeutic drug level monitoring: Secondary | ICD-10-CM

## 2019-11-16 DIAGNOSIS — D7219 Other eosinophilia: Secondary | ICD-10-CM

## 2019-11-16 DIAGNOSIS — Z87891 Personal history of nicotine dependence: Secondary | ICD-10-CM

## 2019-11-16 DIAGNOSIS — Z7951 Long term (current) use of inhaled steroids: Secondary | ICD-10-CM

## 2019-11-16 DIAGNOSIS — Z79899 Other long term (current) drug therapy: Secondary | ICD-10-CM

## 2019-11-16 DIAGNOSIS — Z825 Family history of asthma and other chronic lower respiratory diseases: Secondary | ICD-10-CM

## 2019-11-16 DIAGNOSIS — Z833 Family history of diabetes mellitus: Secondary | ICD-10-CM

## 2019-11-16 DIAGNOSIS — Z8249 Family history of ischemic heart disease and other diseases of the circulatory system: Secondary | ICD-10-CM

## 2019-11-16 DIAGNOSIS — D721 Eosinophilia, unspecified: Secondary | ICD-10-CM

## 2019-11-16 DIAGNOSIS — R7401 Elevation of levels of liver transaminase levels: Secondary | ICD-10-CM

## 2019-11-16 DIAGNOSIS — Z7982 Long term (current) use of aspirin: Secondary | ICD-10-CM

## 2019-11-16 DIAGNOSIS — J449 Chronic obstructive pulmonary disease, unspecified: Secondary | ICD-10-CM

## 2019-11-16 LAB — CBC WITH DIFFERENTIAL/PLATELET
Abs Immature Granulocytes: 0.01 10*3/uL (ref 0.00–0.07)
Basophils Absolute: 0.1 10*3/uL (ref 0.0–0.1)
Basophils Relative: 2 %
Eosinophils Absolute: 0.5 10*3/uL (ref 0.0–0.5)
Eosinophils Relative: 10 %
HCT: 46.9 % (ref 39.0–52.0)
Hemoglobin: 15.9 g/dL (ref 13.0–17.0)
Immature Granulocytes: 0 %
Lymphocytes Relative: 30 %
Lymphs Abs: 1.6 10*3/uL (ref 0.7–4.0)
MCH: 31.1 pg (ref 26.0–34.0)
MCHC: 33.9 g/dL (ref 30.0–36.0)
MCV: 91.8 fL (ref 80.0–100.0)
Monocytes Absolute: 0.3 10*3/uL (ref 0.1–1.0)
Monocytes Relative: 5 %
Neutro Abs: 2.7 10*3/uL (ref 1.7–7.7)
Neutrophils Relative %: 53 %
Platelets: 311 10*3/uL (ref 150–400)
RBC: 5.11 MIL/uL (ref 4.22–5.81)
RDW: 12.7 % (ref 11.5–15.5)
WBC: 5.2 10*3/uL (ref 4.0–10.5)
nRBC: 0 % (ref 0.0–0.2)

## 2019-11-16 LAB — HEPATITIS PANEL, ACUTE
HCV Ab: NONREACTIVE
Hep A IgM: NONREACTIVE
Hep B C IgM: NONREACTIVE
Hepatitis B Surface Ag: NONREACTIVE

## 2019-11-16 LAB — LACTATE DEHYDROGENASE: LDH: 141 U/L (ref 98–192)

## 2019-11-16 LAB — TECHNOLOGIST SMEAR REVIEW: Plt Morphology: NORMAL

## 2019-11-16 NOTE — Telephone Encounter (Signed)
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  It is not an infection per se but that the fungus has colonized the airways.  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  The mainstay of therapy for ABPA is STEROIDS.  Eosinophil count is down to 0.5 (very low) after being treated with steroids during this last episode.  Itraconazole is an antifungal that can be used to HELP with reducing steroids however it is not a cure.  He will also need XOLAIR to help manage his asthma as well this has also been of help in ABPA.  We will send a prescription for itraconazole 200 mg twice daily he will take it twice a day we will need to check blood work 3 weeks after starting the itraconazole to make sure that he is tolerating it okay.  He will need to be treated for 16 weeks.  We will initiate paperwork for XOLAIR.  Gailen Shelter, MD [facility]

## 2019-11-16 NOTE — Progress Notes (Signed)
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

## 2019-11-16 NOTE — Telephone Encounter (Signed)
Called and spoke to pt's spouse, Shannon Knapp (DPR).  Shannon Knapp stated that pt had an appt with hematology today, and it was mentioned that the fungal infection that pt has can be life threaten and needs to be treated by our office. Per Shannon Knapp she was made aware from hematology that our office has attempted to contact pt without success regarding this fungal medication. Shannon Knapp denied receiving a phone call from our office. I do not see record of our office attempting to contact pt regarding a fungal medication. Shannon Knapp became upset during our conversation, due to this I asked to speak with the pt. I spoke to pt and explained that a message would sent to Dr. [name]. Pt is aware that our office will contact him with a response.   Dr. [name], please advise. thanks

## 2019-11-16 NOTE — Progress Notes (Signed)
Hematology/Oncology Consult note [facility] [phone] [phone]   Patient Care Team: Patient, No Pcp Per as PCP - General (General Practice)  REFERRING PROVIDER: Julio SicksParrett, Tammy S, NP  CHIEF COMPLAINTS/REASON FOR VISIT:  Evaluation of eosinophilia leukocytosis  HISTORY OF PRESENTING ILLNESS:   Shannon Knapp is a  34 y.o.  male with PMH listed below was seen in consultation at the request of  Parrett, Tammy S, NP  for evaluation of eosinophilia leukocytosis  Patient reports a chronic history of persistent severe asthma, his previous work-up showed significantly elevated IgE level and intermittent elevated eosinophilia. He recently has had recurrent exacerbations of asthma resulting in hospitalizations. 10/22/2017 CT chest with contrast showed minimal central bronchial thickening and dependent atelectasis in both lower lobes.  No pleural effusion or other explanation for shortness of breath. 10/20/2019, chest x-ray showed hazy airspace opacity in the right mid lung suspicious for pneumonia. 10/22/2019 chest x-ray showed new left basilar pneumonia 10/25/2019, chest x-ray showed significant increasing left lower lobe pneumonia when compared with prior exam Was treated with antibiotics.  Patient also has been on prednisone chronically, inhalers. 11/03/2019, chest x-ray 2 view showed virtually complete clearing of infiltrate from the left base with small amount of residual atelectasis in the area.  Lungs as well clear.  Heart size normal.  No adenopathy.  Patient was seen by pulmonology Dr. Vincente PoliGonzalez/ Parrett Tammy for evaluation of asthma and pneumonia.  Work-up showed negative for ANA, ANCA, Patient has eosinophilia.  Suspecting pulmonary eosinophilia. Aspergillus IgE panel was positive Was referred to hematology oncology for evaluation of eosinophilia leukocytosis.  Review of Systems  Constitutional: Positive for fatigue. Negative for appetite  change, chills, fever and unexpected weight change.  HENT:   Negative for hearing loss and voice change.   Eyes: Negative for eye problems and icterus.  Respiratory: Positive for cough, shortness of breath and wheezing. Negative for chest tightness.   Cardiovascular: Negative for chest pain and leg swelling.  Gastrointestinal: Negative for abdominal distention and abdominal pain.  Endocrine: Negative for hot flashes.  Genitourinary: Negative for difficulty urinating, dysuria and frequency.   Musculoskeletal: Negative for arthralgias.  Skin: Negative for itching and rash.  Neurological: Negative for light-headedness and numbness.  Hematological: Negative for adenopathy. Does not bruise/bleed easily.  Psychiatric/Behavioral: Negative for confusion.    MEDICAL HISTORY:  Past Medical History:  Diagnosis Date   Asthma    Chronic bronchitis (HCC)    COPD (chronic obstructive pulmonary disease) (HCC)     SURGICAL HISTORY: Past Surgical History:  Procedure Laterality Date   VIDEO BRONCHOSCOPY Bilateral 10/24/2017   Procedure: VIDEO BRONCHOSCOPY WITHOUT FLUORO;  Surgeon: Alyson ReedyYacoub, Wesam G, MD;  [facility];  Service: Cardiopulmonary;  Laterality: Bilateral;    SOCIAL HISTORY: Social History   Socioeconomic History   Marital status: Single    Spouse name: Not on file   Number of children: Not on file   Years of education: Not on file   Highest education level: Not on file  Occupational History   Not on file  Tobacco Use   Smoking status: Former Smoker    Packs/day: 0.25    Years: 14.00    Pack years: 3.50    Types: Cigarettes    Start date: 2006    Quit date: 08/2018    Years since quitting: 1.2   Smokeless tobacco: Never Used  Vaping Use   Vaping Use: Never used  Substance and Sexual Activity   Alcohol use: Not  Currently    Alcohol/week: 15.0 standard drinks    Types: 15 Cans of beer per week   Drug use: No   Sexual activity: Yes  Other Topics  Concern   Not on file  Social History Narrative   Not on file   Social Determinants of Health   Financial Resource Strain:    Difficulty of Paying Living Expenses:   Food Insecurity:    Worried About Programme researcher, broadcasting/film/video in the Last Year:    Barista in the Last Year:   Transportation Needs:    Freight forwarder (Medical):    Lack of Transportation (Non-Medical):   Physical Activity:    Days of Exercise per Week:    Minutes of Exercise per Session:   Stress:    Feeling of Stress :   Social Connections:    Frequency of Communication with Friends and Family:    Frequency of Social Gatherings with Friends and Family:    Attends Religious Services:    Active Member of Clubs or Organizations:    Attends Engineer, structural:    Marital Status:   Intimate Partner Violence:    Fear of Current or Ex-Partner:    Emotionally Abused:    Physically Abused:    Sexually Abused:     FAMILY HISTORY: Family History  Problem Relation Age of Onset   Hypertension Mother    Diabetes Mother    Asthma Father    Hypertension Father     ALLERGIES:  is allergic to amoxicillin, doxycycline, fish allergy, shellfish allergy, and penicillins.  MEDICATIONS:  Current Outpatient Medications  Medication Sig Dispense Refill   albuterol (PROVENTIL) (2.5 MG/3ML) 0.083% nebulizer solution Take 3 mLs (2.5 mg total) by nebulization every 4 (four) hours as needed for wheezing or shortness of breath. Use twice daily and as needed for coughing 75 mL 2   albuterol (VENTOLIN HFA) 108 (90 Base) MCG/ACT inhaler Inhale 2 puffs into the lungs every 6 (six) hours as needed for wheezing or shortness of breath. 18 g 3   Aspirin-Salicylamide-Caffeine (ARTHRITIS STRENGTH BC POWDER PO) Take 1 Package by mouth in the morning and at bedtime.     cetirizine (ZYRTEC) 10 MG tablet Take 10 mg by mouth daily.     GUAIFENESIN 1200 PO Take 1 tablet by mouth daily.      montelukast (SINGULAIR) 10 MG tablet Take 1 tablet (10 mg total) by mouth at bedtime. 30 tablet 5   predniSONE (DELTASONE) 10 MG tablet Take 10 mg by mouth daily with breakfast.     albuterol (VENTOLIN HFA) 108 (90 Base) MCG/ACT inhaler Inhale 2 puffs into the lungs every 6 (six) hours as needed for shortness of breath. (Patient not taking: Reported on 11/16/2019) 6.7 g 0   benzonatate (TESSALON) 100 MG capsule Take 1 capsule (100 mg total) by mouth 3 (three) times daily. (Patient not taking: Reported on 11/16/2019) 20 capsule 0   budesonide-formoterol (SYMBICORT) 160-4.5 MCG/ACT inhaler Inhale 2 puffs into the lungs 2 (two) times daily. (Patient not taking: Reported on 11/16/2019) 1 Inhaler 11   No current facility-administered medications for this visit.     PHYSICAL EXAMINATION: ECOG PERFORMANCE STATUS: 1 - Symptomatic but completely ambulatory Vitals:   11/16/19 1132  BP: 129/88  Pulse: 86  Resp: 18  Temp: (!) 96.1 F (35.6 C)   Filed Weights   11/16/19 1132  Weight: 163 lb (73.9 kg)    Physical Exam Constitutional:  General: He is not in acute distress. HENT:     Head: Normocephalic and atraumatic.  Eyes:     General: No scleral icterus. Cardiovascular:     Rate and Rhythm: Normal rate and regular rhythm.     Heart sounds: Normal heart sounds.  Pulmonary:     Effort: Pulmonary effort is normal. No respiratory distress.     Breath sounds: No wheezing.  Abdominal:     General: Bowel sounds are normal. There is no distension.     Palpations: Abdomen is soft.  Musculoskeletal:        General: No deformity. Normal range of motion.     Cervical back: Normal range of motion and neck supple.  Skin:    General: Skin is warm and dry.     Findings: No erythema or rash.  Neurological:     Mental Status: He is alert and oriented to person, place, and time. Mental status is at baseline.     Cranial Nerves: No cranial nerve deficit.     Coordination: Coordination normal.    Psychiatric:        Mood and Affect: Mood normal.     LABORATORY DATA:  I have reviewed the data as listed Lab Results  Component Value Date   WBC 5.2 11/16/2019   HGB 15.9 11/16/2019   HCT 46.9 11/16/2019   MCV 91.8 11/16/2019   PLT 311 11/16/2019   Recent Labs    10/25/19 1401 10/26/19 0528 10/26/19 0923 10/27/19 0608 11/03/19 1007  NA  --  130* 131* 134*  --   K  --  5.7* 4.9 4.4  --   CL  --  99 98 103  --   CO2  --  23 25 23   --   GLUCOSE  --  130* 131* 116*  --   BUN  --  11 9 11   --   CREATININE  --  0.95 0.95 0.88  --   CALCIUM  --  8.2* 8.5* 8.7*  --   GFRNONAA  --  >60 >60 >60  --   GFRAA  --  >60 >60 >60  --   PROT   < >  --  7.2 6.6 6.2  ALBUMIN   < >  --  2.8* 2.6* 3.2*  AST   < >  --  89* 61* 43*  ALT   < >  --  140* 131* 77*  ALKPHOS   < >  --  90 83 66  BILITOT   < >  --  1.0 0.6 0.3  BILIDIR  --   --  0.2  --  0.1  IBILI  --   --  0.8  --   --    < > = values in this interval not displayed.   Iron/TIBC/Ferritin/ %Sat No results found for: IRON, TIBC, FERRITIN, IRONPCTSAT    RADIOGRAPHIC STUDIES: I have personally reviewed the radiological images as listed and agreed with the findings in the report. DG Chest 2 View  Result Date: 11/03/2019 CLINICAL DATA:  Recent  pneumonia EXAM: CHEST - 2 VIEW COMPARISON:  October 25, 2019 FINDINGS: There has been clearing of infiltrate from the left base. Minimal atelectatic change remains in the left base. Lungs elsewhere are clear. Heart size and pulmonary vascularity are normal. No adenopathy. No bone lesions. IMPRESSION: Virtually complete clearing of infiltrate from the left base with small amount of residual atelectasis in this area. Lungs elsewhere clear. Heart size normal. No  adenopathy. Electronically Signed   By: Bretta Bang III M.D.   On: 11/03/2019 09:46   DG Chest 2 View  Result Date: 10/25/2019 CLINICAL DATA:  Cough and weakness for 1 day EXAM: CHEST - 2 VIEW COMPARISON:  10/22/2019 FINDINGS:  Cardiac shadow is stable. The lungs are again hyperinflated. Increasing left lower lobe infiltrate is noted. No sizable effusion is noted. Some mild right basilar infiltrate is seen as well. No bony abnormality is noted. IMPRESSION: Significant increase in left lower lobe pneumonia when compared with the prior exam. Electronically Signed   By: Alcide Clever M.D.   On: 10/25/2019 15:41   DG Chest 2 View  Result Date: 10/22/2019 CLINICAL DATA:  Cough, shortness of breath. EXAM: CHEST - 2 VIEW COMPARISON:  October 20, 2019. FINDINGS: The heart size and mediastinal contours are within normal limits. No pneumothorax or pleural effusion is noted. New left basilar opacity is noted concerning for pneumonia. Right lung is unremarkable. The visualized skeletal structures are unremarkable. IMPRESSION: New left basilar pneumonia. Electronically Signed   By: Lupita Raider M.D.   On: 10/22/2019 13:37   DG Chest 2 View  Result Date: 10/20/2019 CLINICAL DATA:  Dyspnea EXAM: CHEST - 2 VIEW COMPARISON:  10/29/2018 FINDINGS: The heart size and mediastinal contours are within normal limits. Hazy airspace opacity in the right mid lung. The left lung appears clear. No pleural effusion. No pneumothorax. The visualized skeletal structures are unremarkable. IMPRESSION: Hazy airspace opacity in the right mid lung suspicious for pneumonia. Electronically Signed   By: Duanne Guess D.O.   On: 10/20/2019 12:35      ASSESSMENT & PLAN:  1. Eosinophilic leukocytosis, unspecified type   2. 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  I will defer treatment to pulmonology.  Patient has a follow-up appointment with pulmonology. I discussed with patient that multiple condition can cause eosinophilia, for example, acute on chronic inflammation, infection, allergy, medication, myeloproliferative process.  Patient does not have persistently elevated eosinophilia.  Eosinophilia is more prominent  in the past 3 weeks, more consistent with a reactive process.  I recommend to proceed with HES work-up to rule out the primary bone marrow etiologies.  Most likely the eosinophilia is likely due to aspergillus infection/allergies/asthma.  Transaminitis, check hepatitis.  Denies any alcohol use currently.  He used to drink alcohol in the past.  Etiology unknown.  He may need additional work-up with gastroenterologist.  Orders Placed This Encounter  Procedures   MPN w/Hypereosinophilia FISH    Standing Status:   Future    Number of Occurrences:   1    Standing Expiration Date:   11/15/2020   Flow cytometry panel-leukemia/lymphoma work-up    Standing Status:   Future    Number of Occurrences:   1    Standing Expiration Date:   11/15/2020   JAK2 V617F, w Reflex to CALR/E12/MPL    Standing Status:   Future    Number of Occurrences:   1    Standing Expiration Date:   11/15/2020   CBC with Differential/Platelet    Standing Status:   Future    Number of Occurrences:   1    Standing Expiration Date:   11/15/2020   Technologist smear review    Standing Status:   Future    Number of Occurrences:   1    Standing Expiration Date:   11/15/2020   Hepatitis panel, acute    Standing Status:   Future  Number of Occurrences:   1    Standing Expiration Date:   11/15/2020   Lactate dehydrogenase    Standing Status:   Future    Number of Occurrences:   1    Standing Expiration Date:   11/15/2020    All questions were answered. The patient knows to call the clinic with any problems questions or concerns.  cc Parrett, Virgel Bouquet, NP    Return of visit: If his lab work-up from our clinic is negative, he does not need to follow-up with me.  Continue follow-up with pulmonology Thank you for this kind referral and the opportunity to participate in the care of this patient. A copy of today's note is routed to referring provider    Rickard Patience, MD, PhD Hematology Oncology [facility] at  [facility] Pager- [phone] 11/16/2019

## 2019-11-16 NOTE — Telephone Encounter (Signed)
Spoke to pt and attempted to relay below message. Pt requested that I call him back in , as he is currently checking out at [employer]. I suggested that pt call back when he is able to speak with me.

## 2019-11-16 NOTE — Telephone Encounter (Signed)
Per Dr. [name]- cmet and cbc w/diff is needed three weeks after starting itraconazole.

## 2019-11-17 MED ORDER — ITRACONAZOLE 200 MG PO TABS: 1.0000 | ORAL_TABLET | Freq: Two times a day (BID) | ORAL | 1 refills | Status: DC

## 2019-11-17 NOTE — Telephone Encounter (Signed)
Pt is aware of below message and voiced his understanding.  Rx for Itraconazole has been sent top referred pharmacy. Recommended that pt use goodRx. Pt is aware of need for labs in 3 weeks.  Orders have been placed. Nothing further is needed.

## 2019-11-18 ENCOUNTER — Encounter: Payer: Self-pay | Admitting: *Deleted

## 2019-11-18 NOTE — Progress Notes (Signed)
Recently diagnosed with ABPA.  Currently on prednisone and itraconazole.  Initiating Xolair.  Gailen Shelter, MD [facility]

## 2019-11-20 LAB — COMP PANEL: LEUKEMIA/LYMPHOMA

## 2019-11-24 ENCOUNTER — Telehealth: Payer: Self-pay | Admitting: Pulmonary Disease

## 2019-11-24 ENCOUNTER — Other Ambulatory Visit: Payer: Self-pay

## 2019-11-24 ENCOUNTER — Encounter: Payer: Self-pay | Admitting: Pulmonary Disease

## 2019-11-24 ENCOUNTER — Ambulatory Visit (INDEPENDENT_AMBULATORY_CARE_PROVIDER_SITE_OTHER): Payer: Self-pay | Admitting: Pulmonary Disease

## 2019-11-24 VITALS — BP 132/80 | HR 95 | Temp 98.0°F | Ht 71.0 in | Wt 167.8 lb

## 2019-11-24 DIAGNOSIS — J4550 Severe persistent asthma, uncomplicated: Secondary | ICD-10-CM

## 2019-11-24 DIAGNOSIS — J3089 Other allergic rhinitis: Secondary | ICD-10-CM

## 2019-11-24 DIAGNOSIS — D7218 Eosinophilia in diseases classified elsewhere: Secondary | ICD-10-CM

## 2019-11-24 DIAGNOSIS — B4481 Allergic bronchopulmonary aspergillosis: Secondary | ICD-10-CM

## 2019-11-24 LAB — CALR + JAK2 E12-15 + MPL (REFLEXED)

## 2019-11-24 LAB — JAK2 V617F, W REFLEX TO CALR/E12/MPL

## 2019-11-24 MED ORDER — PREDNISONE 10 MG PO TABS: 10.0000 mg | ORAL_TABLET | Freq: Every day | ORAL | 5 refills | Status: DC

## 2019-11-24 NOTE — Progress Notes (Signed)
Subjective:    Patient ID: Shannon Knapp, male    DOB: November 22, 1985, 34 y.o.   MRN: [id number]  HPI Is a 34 year old former smoker, prior patient of Dr. Isaiah Serge at our [facility] office, who follows here for severe persistent asthma, elevated IgE and eosinophilia.  He was evaluated in early June and again in mid June by Rubye Oaks, NP for worsening symptoms.  He had been placed on Trelegy Ellipta on his initial visit at our clinic however did not tolerate this.  He was switched back to [facility] 16 June.  His blood work on 16 June showed a relative eosinophil count of 52% and absolute eosinophils of 5.7.  RAST profile was positive across the board, he had IgE directed to Aspergillus fumigatus Aspergillus [place] close versicolor.  ANCA panel was negative, total IgE was 3840.  Eosinophil count after prednisone therapy: Relative eosinophils 10% and absolute eosinophils 0.5.  I discussed the findings with the patient and this is consistent with ABPA.  I discussed the pathophysiology of ABPA with the patient.  We discussed also that the mainstay of therapy is prednisone however itraconazole may help [name] decrease the amount of prednisone required.  We also discussed using Xolair as an adjunct so the patient does not have to be prednisone dependent.  Patient understood all the explanations.  Visual aids were used in the forms of charts and notes for the patient.  The patient today has no complaints.  He states he feels back to "normal" dyspnea well-controlled.  No wheezing.  He is compliant with Dulera.  Taking prednisone at 10 mg daily.  No chest pain, fevers, chills or sweats.  No purulent sputum production.  No other symptomatology.  Review of Systems A 10 point review of systems was performed and it is as noted above otherwise negative.    Objective:   Physical Exam BP 132/80 (BP Location: Left Arm, Cuff Size: Normal)   Pulse 95   Temp 98 F (36.7 C) (Temporal)   Ht 5\' 11"  (1.803 m)   Wt 167 lb  12.8 oz (76.1 kg)   SpO2 99%   BMI 23.40 kg/m   GENERAL: Well-developed, well-nourished.  No acute respiratory distress. HEAD: Normocephalic, atraumatic.  EYES: Pupils equal, round, reactive to light.  No scleral icterus.  MOUTH: Nose/mouth/throat not examined due to masking requirements for COVID 19. NECK: Supple. No thyromegaly. No nodules. No JVD.  Trachea midline PULMONARY: Lungs clear to auscultation bilaterally.  No adventitious sounds. CARDIOVASCULAR: S1 and S2. Regular rate and rhythm.  No rubs murmurs or gallops heard. GASTROINTESTINAL: Abdomen is soft, nondistended. MUSCULOSKELETAL: No joint deformity, no clubbing, no edema.  NEUROLOGIC: Awake and alert, no focal deficits.  Speech is fluent. SKIN: Intact,warm,dry.  Limited exam shows no rashes. PSYCH: Mood and behavior normal.      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 Severe persistent asthma without complication  J45.50    Continue Dulera 2 puffs twice a day Did not tolerate Spiriva   3. Perennial allergic rhinitis  J30.89    Multiple allergies noted on RAST   4. 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 predniSONE (DELTASONE) 10 MG tablet  Sig: Take 1 tablet (10 mg total) by mouth daily with breakfast.    Dispense:  30 tablet    Refill:  5    Discussion: The patient has severe persistent asthma and most recently had increases on IgE and significant eosinophilia noted.  Eosinophilia has responded to prednisone.  Work-up has revealed that the patient has ABPA.  The patient will be maintained on prednisone and he is also on itraconazole 200 mg twice a day and will take this for 16 weeks.   The patient will have hepatic panel drawn in 2 weeks to monitor for potential side effects of itraconazole.  He is to continue prednisone at 10 mg daily.  We have initiated paperwork for Xolair therapy.  The patient is currently taking Dulera however will eventually transition to Symbicort due to insurance issues.  We will see him in follow-up next time with either me or the APP.  Gailen Shelter, MD [facility]   *This note was dictated using voice recognition software/Dragon.  Despite best efforts to proofread, errors can occur which can change the meaning.  Any change was purely unintentional.

## 2019-11-24 NOTE — Patient Instructions (Signed)
What we discussed today:  You have ABPA (allergic bronchopulmonary aspergillosis) this is an allergic reaction to a fungus that is very common in the environment.  Asthmatics can developed severe allergic reactions to this fungus.  You will continue on prednisone 10 mg daily.  Your IgE elevation in your eosinophils were due to this.  Prednisone is the main medication for this.  The antifungal itraconazole which you are taking is mainly to reduce the amount of prednisone you have to take not necessarily to kill the fungus which may still be around in the environment.  We are also starting you on a medication called Xolair this medication helps quite a bit in this type of situation.  This medication is an injectable.  Initially will have to go to [facility] to get it but there is now a way to take it at home but you will have to be monitored for a few months at the injection room in [facility] practice.  Remember to have your blood drawn in another few weeks as we need to monitor your liver function while on the antifungal.  For now continue your Dulera 2 puffs twice a day.  I think we should be able to transition you to Symbicort.  I think your failure on Symbicort previously was that you had ABPA that was poorly controlled.  I will see you in follow-up in 3 to 4 weeks call sooner should any new difficulties arise.

## 2019-11-24 NOTE — Telephone Encounter (Signed)
Enrollment forms for Xolair have been completed.  unsuccessful fax to [facility] pharmacy team, due to line being busy.  Will try again on 11/25/2019.

## 2019-11-25 LAB — MPN W/HYPEREOSINOPHILIA FISH
MPN Cells Analyzed: 100
MPN Cells Counted: 100

## 2019-11-26 ENCOUNTER — Encounter: Payer: Self-pay | Admitting: Pulmonary Disease

## 2019-11-26 NOTE — Telephone Encounter (Signed)
Received successful fax confirmation.  Will route to pharmacy team for f/u.

## 2019-11-26 NOTE — Telephone Encounter (Signed)
Thank you.  We will start benefits investigation and update throughout the process.

## 2019-11-26 NOTE — Telephone Encounter (Signed)
I have attempted to fax enrollment form x3-- fax is unsuccessful due to being busy.

## 2019-11-30 NOTE — Telephone Encounter (Signed)
Patient is uninsured, will fax Mendel Ryder forms in. Will update once we receive a response.  Will need [employer] form completed and signed.  Phone# [phone]

## 2019-12-02 ENCOUNTER — Telehealth: Payer: Self-pay

## 2019-12-02 NOTE — Telephone Encounter (Signed)
-----   Message from Rickard Patience, MD sent at 12/01/2019  4:07 PM EDT ----- Please let patient know that her blood work results were fine. No need to follow up with me Eosinophilia is likely due to aspergillosis.  Continue follow-up with pulmonology.  Thank you.

## 2019-12-02 NOTE — Telephone Encounter (Signed)
Message left with patient's fiance, [name] ( ok to release info per DPR).

## 2019-12-07 ENCOUNTER — Telehealth: Payer: Self-pay | Admitting: General Practice

## 2019-12-07 NOTE — Telephone Encounter (Signed)
Individual has been contacted 3+ times regarding ED referral. No further attempts to contact individual will be made. 

## 2019-12-08 MED ORDER — OMALIZUMAB 75 MG/0.5ML ~~LOC~~ SOSY: 75.0000 mg | PREFILLED_SYRINGE | SUBCUTANEOUS | 2 refills | Status: DC

## 2019-12-08 MED ORDER — OMALIZUMAB 150 MG/ML ~~LOC~~ SOSY: 300.0000 mg | PREFILLED_SYRINGE | SUBCUTANEOUS | 2 refills | Status: DC

## 2019-12-08 NOTE — Telephone Encounter (Signed)
Received a fax from  [place] regarding an approval for XOLAIRpatient assistance from 12/08/19 until further notice. Patient will receive free medication from program's [facility].  Phone number: [phone]  [facility]# [phone]

## 2019-12-08 NOTE — Telephone Encounter (Signed)
Prescription sent to [facility] pharmacy.  Please schedule initial appointment upon receipt of medication.   Verlin Fester, PharmD, [place], CPP Clinical Specialty Pharmacist (Rheumatology and Pulmonology)  12/08/2019 2:34 PM

## 2019-12-10 NOTE — Telephone Encounter (Signed)
Patient med has not been received at this time.  Will schedule Patient's Xolair new start once med is at office.

## 2019-12-15 NOTE — Telephone Encounter (Signed)
Called [facility] to follow up on pt's medication. Shipment had to be set up.  Xolair Prefilled Syringe Order: 150mg  Prefilled Syringe:  #4 75mg  Prefilled Syringe: #2 Ordered Date: 12/15/2019 Expected date of arrival: 12/28/2019 Ordered by: 12/17/2019, CMA  Specialty Pharmacy: [facility]  Order #: [id number]

## 2019-12-20 ENCOUNTER — Telehealth: Payer: Self-pay | Admitting: Pulmonary Disease

## 2019-12-20 DIAGNOSIS — J4550 Severe persistent asthma, uncomplicated: Secondary | ICD-10-CM

## 2019-12-20 NOTE — Telephone Encounter (Signed)
He needs to get a CMP done. The whole course will be 16 weeks. But we will reassess him with the CMP. He did have pneumonia at one point so a follow-up chest x-ray will not be a bad idea. He can get both done at the same time.

## 2019-12-20 NOTE — Telephone Encounter (Signed)
Called and spoke to pt.  Pt stated that he completed 6 week course of Itraconazole. Pt is questioning if he should have a CXR, as he felt this was mentioned. I do not see mentioning of CXR, however I do see mentioning of labs. Pt did not get labs but says he will come tomorrow to do so. Pt also requested a Rx for new nebulizer machine. Order has been placed.   Dr. [name], please advise on CXR.

## 2019-12-20 NOTE — Telephone Encounter (Signed)
Pt is aware of below message and voiced his understanding.  CXR has been placed.  Labs are pending.  Nothing further is needed at this time.

## 2019-12-21 ENCOUNTER — Other Ambulatory Visit
Admission: RE | Admit: 2019-12-21 | Discharge: 2019-12-21 | Disposition: A | Discharge status: Home / Self Care | Payer: Medicaid Other | Source: Home / Self Care | Attending: Pulmonary Disease | Admitting: Pulmonary Disease

## 2019-12-21 ENCOUNTER — Ambulatory Visit
Admission: RE | Admit: 2019-12-21 | Discharge: 2019-12-21 | Disposition: A | Discharge status: Home / Self Care | Payer: Medicaid Other | Attending: Pulmonary Disease | Admitting: Pulmonary Disease

## 2019-12-21 ENCOUNTER — Ambulatory Visit
Admission: RE | Admit: 2019-12-21 | Discharge: 2019-12-21 | Disposition: A | Discharge status: Home / Self Care | Payer: Medicaid Other | Source: Ambulatory Visit | Attending: Pulmonary Disease | Admitting: Pulmonary Disease

## 2019-12-21 DIAGNOSIS — Z5181 Encounter for therapeutic drug level monitoring: Secondary | ICD-10-CM | POA: Insufficient documentation

## 2019-12-21 DIAGNOSIS — J4550 Severe persistent asthma, uncomplicated: Secondary | ICD-10-CM

## 2019-12-21 LAB — COMPREHENSIVE METABOLIC PANEL
ALT: 41 U/L (ref 0–44)
AST: 27 U/L (ref 15–41)
Albumin: 4 g/dL (ref 3.5–5.0)
Alkaline Phosphatase: 36 U/L — ABNORMAL LOW (ref 38–126)
Anion gap: 7 (ref 5–15)
BUN: 18 mg/dL (ref 6–20)
CO2: 31 mmol/L (ref 22–32)
Calcium: 8.9 mg/dL (ref 8.9–10.3)
Chloride: 98 mmol/L (ref 98–111)
Creatinine, Ser: 1.03 mg/dL (ref 0.61–1.24)
GFR calc Af Amer: >60 mL/min (ref >60)
GFR calc non Af Amer: >60 mL/min (ref >60)
Glucose, Bld: 90 mg/dL (ref 70–99)
Potassium: 3.7 mmol/L (ref 3.5–5.1)
Sodium: 136 mmol/L (ref 135–145)
Total Bilirubin: 1.3 mg/dL — ABNORMAL HIGH (ref 0.3–1.2)
Total Protein: 6.9 g/dL (ref 6.5–8.1)

## 2019-12-21 LAB — CBC WITH DIFFERENTIAL/PLATELET
Abs Immature Granulocytes: 0.09 10*3/uL — ABNORMAL HIGH (ref 0.00–0.07)
Basophils Absolute: 0 10*3/uL (ref 0.0–0.1)
Basophils Relative: 0 %
Eosinophils Absolute: 0.6 10*3/uL — ABNORMAL HIGH (ref 0.0–0.5)
Eosinophils Relative: 7 %
HCT: 45.1 % (ref 39.0–52.0)
Hemoglobin: 15.4 g/dL (ref 13.0–17.0)
Immature Granulocytes: 1 %
Lymphocytes Relative: 17 %
Lymphs Abs: 1.4 10*3/uL (ref 0.7–4.0)
MCH: 31.9 pg (ref 26.0–34.0)
MCHC: 34.1 g/dL (ref 30.0–36.0)
MCV: 93.4 fL (ref 80.0–100.0)
Monocytes Absolute: 0.6 10*3/uL (ref 0.1–1.0)
Monocytes Relative: 8 %
Neutro Abs: 5.3 10*3/uL (ref 1.7–7.7)
Neutrophils Relative %: 67 %
Platelets: 242 10*3/uL (ref 150–400)
RBC: 4.83 MIL/uL (ref 4.22–5.81)
RDW: 13.3 % (ref 11.5–15.5)
WBC: 8 10*3/uL (ref 4.0–10.5)
nRBC: 0 % (ref 0.0–0.2)

## 2019-12-28 ENCOUNTER — Ambulatory Visit: Payer: Self-pay | Admitting: Pulmonary Disease

## 2019-12-28 NOTE — Telephone Encounter (Signed)
Xolair Prefilled Syringe Received:  150mg  Prefilled Syringe >> quantity #4, lot # , exp date 07/2020 75mg  Prefilled Syringe >> quantity #2, lot # [id number], exp date 07/2020 Medication arrival date: 12/28/2019 Received by: 08/2020, CMA

## 2019-12-29 ENCOUNTER — Other Ambulatory Visit: Payer: Self-pay

## 2019-12-29 MED ORDER — OMEPRAZOLE 40 MG PO CPDR: DELAYED_RELEASE_CAPSULE | ORAL | 2 refills | Status: DC

## 2020-01-07 ENCOUNTER — Ambulatory Visit (INDEPENDENT_AMBULATORY_CARE_PROVIDER_SITE_OTHER): Payer: Self-pay | Admitting: Adult Health

## 2020-01-07 ENCOUNTER — Encounter: Payer: Self-pay | Admitting: Adult Health

## 2020-01-07 ENCOUNTER — Other Ambulatory Visit: Payer: Self-pay

## 2020-01-07 ENCOUNTER — Other Ambulatory Visit: Payer: Self-pay | Admitting: Adult Health

## 2020-01-07 DIAGNOSIS — J189 Pneumonia, unspecified organism: Secondary | ICD-10-CM

## 2020-01-07 DIAGNOSIS — B4481 Allergic bronchopulmonary aspergillosis: Secondary | ICD-10-CM | POA: Insufficient documentation

## 2020-01-07 DIAGNOSIS — J4550 Severe persistent asthma, uncomplicated: Secondary | ICD-10-CM

## 2020-01-07 DIAGNOSIS — J301 Allergic rhinitis due to pollen: Secondary | ICD-10-CM

## 2020-01-07 MED ORDER — ALBUTEROL SULFATE HFA 108 (90 BASE) MCG/ACT IN AERS: 2.0000 | INHALATION_SPRAY | Freq: Four times a day (QID) | RESPIRATORY_TRACT | 3 refills | Status: DC | PRN

## 2020-01-07 MED ORDER — PREDNISONE 10 MG PO TABS: 10.0000 mg | ORAL_TABLET | Freq: Every day | ORAL | 5 refills | Status: DC

## 2020-01-07 MED ORDER — BUDESONIDE-FORMOTEROL FUMARATE 160-4.5 MCG/ACT IN AERO: 2.0000 | INHALATION_SPRAY | Freq: Two times a day (BID) | RESPIRATORY_TRACT | 5 refills | Status: DC

## 2020-01-07 MED ORDER — ALBUTEROL SULFATE (2.5 MG/3ML) 0.083% IN NEBU: 2.5000 mg | INHALATION_SOLUTION | RESPIRATORY_TRACT | 5 refills | Status: DC | PRN

## 2020-01-07 NOTE — Assessment & Plan Note (Signed)
Continue on current regimen .   

## 2020-01-07 NOTE — Addendum Note (Signed)
Addended by: Julio Sicks on: 01/07/2020 05:35 PM   Modules accepted: Orders

## 2020-01-07 NOTE — Assessment & Plan Note (Signed)
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 Patient has significantly clinically improved symptoms with current regimen.  He is to complete itraconazole for a total of 6 weeks.  Continue on prednisone at 10 mg daily.  Plan  Patient Instructions  Continue on Symbicort 2 puffs Twice daily   Continue on Zyrtec 10mg  At bedtime  .  Continue on Singulair daily  Continue on Prednisone 10mg  daily  Continue on Itraconazole Twice daily   Albuterol inhaler or Neb As needed   Asthma action plan as discussed .   Check on Xolair status.  Follow up with Dr. in 4-6 weeks and As needed  With labs.  Please contact office for sooner follow up if symptoms do not improve or worsen or seek emergency care

## 2020-01-07 NOTE — Progress Notes (Signed)
@Patient  ID: , male    DOB: 03/07/1986, 34 y.o.   MRN: [id number]  Chief Complaint  Patient presents with  . Follow-up    Asthma     Referring provider: No ref. 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   RAST profile was positive across the board,   IgE directed to Aspergillus fumigatus Aspergillus 11/05/19 close versicolor.   ANCA panel was negative, total IgE was 3840  Eosinophil count after prednisone therapy: Relative eosinophils 10% and absolute eosinophils 0.5.  01/07/2020 Follow up : Asthma , ABPA Patient presents for a 6-week follow-up.  Patient has severe persistent asthma, allergic rhinitis and ABPA.  Patient has had difficult control asthma.  Patient continued to have recurrent flares.  As above work-up revealed a very high eosinophil count and IgE Aspergillus positive.  ANCA was negative.  IgE was 3840.  Patient was started on prednisone therapy.  Started on itraconazole for 16 weeks.  He is currently on prednisone 10 mg daily.  Patient has been recommended to begin Xolair.  He is currently on Symbicort twice daily.  Since last visit patient is feeling Chest x-ray December 21, 2019 showed complete resolution of left basilar pneumonia. Labs on December 21, 2019 showed normal LFTs.  CBC showed normal white blood cell count.  Eosinophils absolute count 600.  Says he is feeling a lot of better. Able to work some. Decreased cough and wheezing .  Outside heat causes wheezing on occasion.     Allergies  Allergen Reactions  . Amoxicillin Hives    Hives all over the body. 2009 [facility] 2010. Has patient had a PCN reaction causing immediate rash, facial/tongue/throat swelling, SOB or lightheadedness with hypotension: Yes Has patient had a PCN reaction causing severe rash  involving mucus membranes or skin necrosis: No Has patient had a PCN reaction that required hospitalization: No Has patient had a PCN reaction occurring within the last 10 years: Yes If all of the above answers are "NO", then may proceed with Cephalosporin use.  [employer] Doxycycline   . Fish Allergy Swelling  . Shellfish Allergy Swelling  . Penicillins Hives    Has patient had a PCN reaction causing immediate rash, facial/tongue/throat swelling, SOB or lightheadedness with hypotension: Yes Has patient had a PCN reaction causing severe rash involving mucus membranes or skin necrosis: No Has patient had a PCN reaction that required hospitalization: No Has patient had a PCN reaction occurring within the last 10 years: Yes If all of the above answers are "NO", then may proceed with Cephalosporin use.    Immunization History  Administered Date(s) Administered  . Pneumococcal Polysaccharide-23 10/19/2017    Past Medical History:  Diagnosis Date  . Asthma   . Chronic bronchitis (HCC)   . COPD (chronic obstructive pulmonary disease) (HCC)     Tobacco History: Social History   Tobacco Use  Smoking Status Former Smoker  . Packs/day: 0.25  . Years: 14.00  . Pack years: 3.50  . Types: Cigarettes  . Start date: 2006  . Quit date: 08/2018  . Years since quitting: 1.3  Smokeless Tobacco Never Used   Counseling given: Not Answered   Outpatient Medications Prior to Visit  Medication Sig Dispense Refill  . albuterol (VENTOLIN HFA) 108 (90 Base) MCG/ACT inhaler Inhale 2 puffs into the lungs  every 6 (six) hours as needed for shortness of breath. 6.7 g 0  . Aspirin-Salicylamide-Caffeine (ARTHRITIS STRENGTH BC POWDER PO) Take 1 Package by mouth in the morning and at bedtime.    . budesonide-formoterol (SYMBICORT) 160-4.5 MCG/ACT inhaler Inhale 2 puffs into the lungs 2 (two) times daily. 1 Inhaler 11  . cetirizine (ZYRTEC) 10 MG tablet Take 10 mg by mouth daily.    . GUAIFENESIN 1200 PO Take 1  tablet by mouth daily.    . Itraconazole 200 MG TABS Take 1 tablet by mouth in the morning and at bedtime. 30 tablet 1  . montelukast (SINGULAIR) 10 MG tablet Take 1 tablet (10 mg total) by mouth at bedtime. 30 tablet 5  . omeprazole (PRILOSEC) 40 MG capsule 1 tablet daily. Do not take at same time as itraconazole 30 capsule 2  . predniSONE (DELTASONE) 10 MG tablet Take 1 tablet (10 mg total) by mouth daily with breakfast. 30 tablet 5  . albuterol (PROVENTIL) (2.5 MG/3ML) 0.083% nebulizer solution Take 3 mLs (2.5 mg total) by nebulization every 4 (four) hours as needed for wheezing or shortness of breath. Use twice daily and as needed for coughing 75 mL 2  . mometasone-formoterol (DULERA) 200-5 MCG/ACT AERO Inhale 2 puffs into the lungs 2 (two) times daily. (Patient not taking: Reported on 01/07/2020)    . omalizumab Geoffry Paradise(XOLAIR) 150 MG/ML prefilled syringe Inject 300 mg into the skin every 14 (fourteen) days. Along with 75 mg syringe. (Patient not taking: Reported on 01/07/2020) 4 mL 2  . omalizumab (XOLAIR) 75 MG/0.5ML prefilled syringe Inject 75 mg into the skin every 14 (fourteen) days. Along with 150 mg x2 syringe. (Patient not taking: Reported on 01/07/2020) 1 mL 2   No facility-administered medications prior to visit.     Review of Systems:   Constitutional:   No  weight loss, night sweats,  Fevers, chills, fatigue, or  lassitude.  HEENT:   No headaches,  Difficulty swallowing,  Tooth/dental problems, or  Sore throat,                No sneezing, itching, ear ache,  +nasal congestion, post nasal drip,   CV:  No chest pain,  Orthopnea, PND, swelling in lower extremities, anasarca, dizziness, palpitations, syncope.   GI  No heartburn, indigestion, abdominal pain, nausea, vomiting, diarrhea, change in bowel habits, loss of appetite, bloody stools.   Resp:  .  No chest wall deformity  Skin: no rash or lesions.  GU: no dysuria, change in color of urine, no urgency or frequency.  No flank  pain, no hematuria   MS:  No joint pain or swelling.  No decreased range of motion.  No back pain.    Physical Exam  BP 128/72 (BP Location: Left Arm, Cuff Size: Normal)   Pulse 94   Temp 97.6 F (36.4 C) (Other (Comment)) Comment (Src): wrist  Ht 5\' 11"  (1.803 m)   Wt 172 lb 3.2 oz (78.1 kg)   SpO2 100% Comment: room air  BMI 24.02 kg/m   GEN: A/Ox3; pleasant , NAD, well nourished    HEENT:  [place]/AT,   NOSE-clear, THROAT-clear, no lesions, no postnasal drip or exudate noted.   NECK:  Supple w/ fair ROM; no JVD; normal carotid impulses w/o bruits; no thyromegaly or nodules palpated; no lymphadenopathy.    RESP  Clear  P & A; w/o, wheezes/ rales/ or rhonchi. no accessory muscle use, no dullness to percussion  CARD:  RRR, no m/r/g, no  peripheral edema, pulses intact, no cyanosis or clubbing.  GI:   Soft & nt; nml bowel sounds; no organomegaly or masses detected.   Musco: Warm bil, no deformities or joint swelling noted.   Neuro: alert, no focal deficits noted.    Skin: Warm, no lesions or rashes    Lab Results:  CBC    Component Value Date/Time   WBC 8.0 12/21/2019 0849   RBC 4.83 12/21/2019 0849   HGB 15.4 12/21/2019 0849   HCT 45.1 12/21/2019 0849   PLT 242 12/21/2019 0849   MCV 93.4 12/21/2019 0849   MCH 31.9 12/21/2019 0849   MCHC 34.1 12/21/2019 0849   RDW 13.3 12/21/2019 0849   LYMPHSABS 1.4 12/21/2019 0849   MONOABS 0.6 12/21/2019 0849   EOSABS 0.6 (H) 12/21/2019 0849   BASOSABS 0.0 12/21/2019 0849    BMET    Component Value Date/Time   NA 136 12/21/2019 0849   K 3.7 12/21/2019 0849   CL 98 12/21/2019 0849   CO2 31 12/21/2019 0849   GLUCOSE 90 12/21/2019 0849   BUN 18 12/21/2019 0849   CREATININE 1.03 12/21/2019 0849   CALCIUM 8.9 12/21/2019 0849   GFRNONAA >60 12/21/2019 0849   GFRAA >60 12/21/2019 0849    BNP No results found for: BNP  ProBNP No results found for: PROBNP  Imaging: DG Chest 2 View  Result Date: 12/21/2019 CLINICAL  DATA:  Follow-up pneumonia. EXAM: CHEST - 2 VIEW COMPARISON:  Radiograph 11/03/2019, 10/25/2019, additional priors. FINDINGS: No focal airspace disease. The previous left lung base consolidation has completely resolved. There is stable hyperinflation. No pneumothorax or evidence of pneumomediastinum. The heart is normal in size with normal mediastinal contours. No pleural fluid. No pulmonary edema. Stable osseous structures. IMPRESSION: 1. Completely resolved left lung base pneumonia. No new airspace disease or acute findings. 2. Mild hyperinflation which appears chronic and can be seen in the setting of asthma. Electronically Signed   By: Narda Rutherford M.D.   On: 12/21/2019 11:13      PFT Results Latest Ref Rng & Units 10/29/2017  FVC-Pre L 5.81  FVC-Predicted Pre % 124  Pre FEV1/FVC % % 41  FEV1-Pre L 2.38  FEV1-Predicted Pre % 61  DLCO uncorrected ml/min/mmHg 35.24  DLCO UNC% % 104  DLCO corrected ml/min/mmHg 34.14  DLCO COR %Predicted % 101  DLVA Predicted % 97  TLC L 11.87  TLC % Predicted % 166  RV % Predicted % 360    Lab Results  Component Value Date   NITRICOXIDE 95 10/17/2017        Assessment & Plan:   Severe persistent asthma Significantly improved control on current regimen. Reached out to pharmacy regarding Xolair status  Plan  Patient Instructions  Continue on Symbicort 2 puffs Twice daily   Continue on Zyrtec 10mg  At bedtime  .  Continue on Singulair daily  Continue on Prednisone 10mg  daily  Continue on Itraconazole Twice daily   Albuterol inhaler or Neb As needed   Asthma action plan as discussed .   Check on Xolair status.  Follow up with Dr. in 4-6 weeks and As needed  With labs.  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 Patient has  significantly clinically improved symptoms with current regimen.  He is to complete itraconazole for a total of 6 weeks.  Continue on prednisone at 10 mg daily.  Plan  Patient Instructions  Continue on Symbicort 2 puffs Twice daily   Continue on Zyrtec 10mg  At bedtime  .  Continue on Singulair daily  Continue on Prednisone 10mg  daily  Continue on Itraconazole Twice daily   Albuterol inhaler or Neb As needed   Asthma action plan as discussed .   Check on Xolair status.  Follow up with Dr. in 4-6 weeks and As needed  With labs.  Please contact office for sooner follow up if symptoms do not improve or worsen or seek emergency care       Allergic rhinitis Continue on current regimen     , NP 01/07/2020

## 2020-01-07 NOTE — Assessment & Plan Note (Signed)
Significantly improved control on current regimen. Reached out to pharmacy regarding Xolair status  Plan  Patient Instructions  Continue on Symbicort 2 puffs Twice daily   Continue on Zyrtec 10mg  At bedtime  .  Continue on Singulair daily  Continue on Prednisone 10mg  daily  Continue on Itraconazole Twice daily   Albuterol inhaler or Neb As needed   Asthma action plan as discussed .   Check on Xolair status.  Follow up with Dr. in 4-6 weeks and As needed  With labs.  Please contact office for sooner follow up if symptoms do not improve or worsen or seek emergency care

## 2020-01-07 NOTE — Patient Instructions (Addendum)
Continue on Symbicort 2 puffs Twice daily   Continue on Zyrtec 10mg  At bedtime  .  Continue on Singulair daily  Continue on Prednisone 10mg  daily  Continue on Itraconazole Twice daily   Albuterol inhaler or Neb As needed   Asthma action plan as discussed .   Check on Xolair status.  Follow up with Dr. in 4-6 weeks and As needed  With labs.  Please contact office for sooner follow up if symptoms do not improve or worsen or seek emergency care

## 2020-01-07 NOTE — Assessment & Plan Note (Signed)
Recent pneumonia resolved on chest x-ray clinically improved

## 2020-01-10 NOTE — Progress Notes (Signed)
Agree with the details of the visit as noted below.  As discussed with Rubye Oaks, NP.  Gailen Shelter, MD [facility]

## 2020-01-18 ENCOUNTER — Telehealth: Payer: Self-pay | Admitting: Pharmacist

## 2020-01-18 NOTE — Telephone Encounter (Signed)
Patient failed to provide requested 2021 financial documentation. No additional medication assistance will be provided by [facility] without the required proof of income documentation. Patient notified by letter Debra Cheek Administrative Assistant [facility] 

## 2020-01-19 ENCOUNTER — Telehealth: Payer: Self-pay | Admitting: Pulmonary Disease

## 2020-01-20 NOTE — Telephone Encounter (Signed)
ATC Patient.  LM detailed message (DPR) to call back. Offered 02/04/2020 at 1430 for Xolair new start.

## 2020-01-25 ENCOUNTER — Telehealth: Payer: Self-pay | Admitting: Pulmonary Disease

## 2020-01-25 DIAGNOSIS — B4481 Allergic bronchopulmonary aspergillosis: Secondary | ICD-10-CM

## 2020-01-25 NOTE — Telephone Encounter (Signed)
Lm for patient.  

## 2020-01-26 ENCOUNTER — Telehealth: Payer: Self-pay | Admitting: Pulmonary Disease

## 2020-01-26 MED ORDER — EPINEPHRINE 0.3 MG/0.3ML IJ SOAJ
0.3000 mg | Freq: Once | INTRAMUSCULAR | 5 refills | Status: AC
Start: 2020-01-26 — End: 2020-01-26

## 2020-01-26 NOTE — Telephone Encounter (Signed)
Called and spoke with Patient. Patient is scheduled 02/04/20, at 1415 for first Xolair injection. Patient is aware of office policy, 2 hours wait after first injection, and to bring epi pen. Understanding stated. Patient requested epi pen prescription to be sent to [facility] [address]. Nothing further at this time.

## 2020-01-26 NOTE — Telephone Encounter (Signed)
Spoke with Shannon Knapp patient's girlfriend regarding patient. Shannon Knapp stated patient has been weak and symptoms started last week. Patient is so weak he can not harley get out of bed and fell today. I had advised Shannon Knapp to bring patient to the ED for evaluation and to contact us back after to make a appt for patient to start his Xolair shot . Patient has been called and left voicemail's to contact our office to set up appt patient has never returned a phone call.Shannon Knapp's voice was understanding. Nothing else further needed.

## 2020-01-28 MED ORDER — ITRACONAZOLE 200 MG PO TABS: 1.0000 | ORAL_TABLET | Freq: Two times a day (BID) | ORAL | 0 refills | Status: DC

## 2020-01-28 NOTE — Telephone Encounter (Signed)
Spoke with the pt  He states that he has already completed 2 6 wk rounds of intraconazole  Wants to know if he is to take another round and if so will this be the last one? He last had cmet and cbc on 8/3 and wonders also if these need to be repeated now as well  Please advise thanks

## 2020-01-28 NOTE — Telephone Encounter (Signed)
He will need 4 more weeks and a CMP

## 2020-01-28 NOTE — Telephone Encounter (Signed)
Spoke with the pt and notified of response per Dr [name]  He verbalized understanding  I have sent rx to pharm and lab order placed

## 2020-02-04 ENCOUNTER — Ambulatory Visit: Payer: Medicaid Other

## 2020-02-07 ENCOUNTER — Other Ambulatory Visit: Payer: Medicaid Other

## 2020-02-07 ENCOUNTER — Other Ambulatory Visit: Payer: Self-pay

## 2020-02-07 DIAGNOSIS — Z20822 Contact with and (suspected) exposure to covid-19: Secondary | ICD-10-CM

## 2020-02-08 LAB — NOVEL CORONAVIRUS, NAA: SARS-CoV-2, NAA: NOT DETECTED

## 2020-02-08 LAB — SARS-COV-2, NAA 2 DAY TAT

## 2020-02-18 ENCOUNTER — Other Ambulatory Visit: Payer: Self-pay

## 2020-02-18 ENCOUNTER — Telehealth: Payer: Self-pay | Admitting: Pulmonary Disease

## 2020-02-18 ENCOUNTER — Ambulatory Visit (INDEPENDENT_AMBULATORY_CARE_PROVIDER_SITE_OTHER): Payer: Medicaid Other | Admitting: Primary Care

## 2020-02-18 ENCOUNTER — Encounter: Payer: Self-pay | Admitting: Primary Care

## 2020-02-18 VITALS — BP 128/80 | HR 95 | Temp 95.5°F | Ht 71.0 in | Wt 167.2 lb

## 2020-02-18 DIAGNOSIS — J4550 Severe persistent asthma, uncomplicated: Secondary | ICD-10-CM

## 2020-02-18 MED ORDER — EPINEPHRINE 0.3 MG/0.3ML IJ SOAJ: 0.3000 mg | Freq: Once | INTRAMUSCULAR | 0 refills | Status: AC

## 2020-02-18 MED ORDER — BUDESONIDE-FORMOTEROL FUMARATE 160-4.5 MCG/ACT IN AERO: 2.0000 | INHALATION_SPRAY | Freq: Two times a day (BID) | RESPIRATORY_TRACT | 5 refills | Status: DC

## 2020-02-18 MED ORDER — METHYLPREDNISOLONE ACETATE 80 MG/ML IJ SUSP: 120.0000 mg | Freq: Once | INTRAMUSCULAR | Status: DC

## 2020-02-18 MED ORDER — AZITHROMYCIN 250 MG PO TABS: ORAL_TABLET | ORAL | 0 refills | Status: DC

## 2020-02-18 MED ORDER — METHYLPREDNISOLONE ACETATE 80 MG/ML IJ SUSP
120.0000 mg | Freq: Once | INTRAMUSCULAR | Status: AC
  Administered 2020-02-18: 120 mg via INTRAMUSCULAR

## 2020-02-18 MED ORDER — PREDNISONE 10 MG PO TABS: ORAL_TABLET | ORAL | 0 refills | Status: DC

## 2020-02-18 MED ORDER — AIRDUO DIGIHALER 232-14 MCG/ACT IN AEPB: 1.0000 | INHALATION_SPRAY | Freq: Two times a day (BID) | RESPIRATORY_TRACT | 0 refills | Status: DC

## 2020-02-18 NOTE — Patient Instructions (Addendum)
Rx: - Zpack - Prednisone taper (50mg  x 3 days; 40mg  x 3 days; 30mg  x 3 days; 20mg  x 3 days; 10mg  x 3 days)  Office treatment: - Depo medrol 120mg  IM injection x 1 today  Recommendation: -  For now use AIRDUO 1 puff twice daily until you get Symbicort filled  - We will send a message to OUR pharmacist to see if we can help with assistance with [facility] orther wise with GOODRX coupon you should be able to [facility]EN [facility] around 100 dollars - Call medication management and ask if you are able to get Symbicort through them; if not able to get assistance I would call [facility] and see how much Symbicort prescription will cost you  Notes: - Test claim showed that [employer] does not cover epipen. I would reach out to your insurance company and question why you are on this plan. We can apply for patient assistance for Epipen. OR GOODRX coupon you should be able to get [facility]EN [facility] for around 100 dollars (we would need to [facility])

## 2020-02-18 NOTE — Telephone Encounter (Addendum)
Spoke to patient's Shannon Knapp(DPR). Shannon Knapp stated that patient is experiencing chest tightness, increased sob, productive cough with clear to green mucus and wheezing. symptoms have been present for 6 days.  Denied fever, chills, sweats Patients son tested positive for covid on 01/25/2020. Patient has been test twice, which was negative each time.  Using albuterol neb every hour. Last took Symbicort on Monday, as he ran out.  Rx for Symbicort has been sent to preferred. Patient went to ED and was told it would be a 17hr wait. Patient refused to go back to ED Patient has been scheduled for acute visit today at 3:00 with [facility]. Okay per [employer].  Nothing further needed.

## 2020-02-18 NOTE — Assessment & Plan Note (Signed)
Severe persistent asthma with acute exacerbation. Symptoms started 4 days ago after he ran out of Symbicort inhaler. Patient has poor medication compliance d/t cost. He originally was going to use medication management when he was uninsured but currently has [employer] which is not covering a lot of his medications. He has not started [facility] because he can not afford EPIPEN prescription. Pharmacy ran test claim which showed that [employer] will not cover cost of Epipen. We will print out patient assistance form to given to patient, he could also use [facility] coupon [facility] to bring cost down to around $100 dollars. We have sent prescription for Symbicort in pharmacy, if he can not afford this he is to let our office know. In the mean time we gave patient a sample of AIRDUO Respiclick 232/59mcg 1 puff twice daily. He received dep-medrol 120mg  IM injection today and we are giving him a prescription for Zpack and prednisone taper for acute asthmatic bronchitis.   Plan: - Sample AIR Duo 232/56mcg until he can get Symbicort refilled - RX zpack and prednisone taper (50mg  x 3 days; 40mg  x 3 days; 20mg  x 3 days; 10mg  x 3 days) - Patient assistance for [facility] sent to patient/ or try [facility]RX coupon [facility] - Needs to get set up with Xolair injections

## 2020-02-18 NOTE — Progress Notes (Signed)
@Patient  ID: , male    DOB: [birth year], 34 y.o.   MRN: [id number]  Chief Complaint  Patient presents with  . Acute Visit    Pt has had complaints of cough with white to yellowish green phlegm, increased SOB, tightness in chest and feels like airway is closing up. Pt has had problems with sleeping. Pt's son did test positive for Covid.    Referring provider: No ref. provider found  HPI: 34 year old male, former smoker. PMH significant for severe persistent asthma, ABPA, allergic rhinitis. Patient of Dr. 31, last seen by pulmonary NP on 01/07/20. Elevated IgE and Eosinophils found to have ABPA. Maintained on Symbicort 160, Singulair, prednisone 10mg  daily. Xolair on hold, patient unable to afford Epipen.   Previous [facility] pulmonary encounter: 01/07/2020 Follow up : Asthma , ABPA Patient presents for a 6-week follow-up.  Patient has severe persistent asthma, allergic rhinitis and ABPA.  Patient has had difficult control asthma.  Patient continued to have recurrent flares.  As above work-up revealed a very high eosinophil count and IgE Aspergillus positive.  ANCA was negative.  IgE was 3840.  Patient was started on prednisone therapy.  Started on itraconazole for 16 weeks.  He is currently on prednisone 10 mg daily.  Patient has been recommended to begin Xolair.  He is currently on Symbicort twice daily.  Since last visit patient is feeling Chest x-ray December 21, 2019 showed complete resolution of left basilar pneumonia. Labs on December 21, 2019 showed normal LFTs.  CBC showed normal white blood cell count.  Eosinophils absolute count 600.  Says he is feeling a lot of better. Able to work some. Decreased cough and wheezing .  Outside heat causes wheezing on occasion.   02/18/2020 - Interim hx Patient presents today for acute visit. Hx severe persistent asthma. Reports increased shortness of breath, wheezing and cough x 4 days. Ran out of Symbicort Monday September 27. Using  Albuterol rescue inhaler 6-8 times a day. Continue Singulair and prendisone 10mg  daily. He has not been started on Xolair injections because he can not afford epipen, which would cost him >400 dollars. States that he is outdoors a lot which triggers his asthma symptoms. 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 He does wear a mask.  He is on Medicaid family planning, he does not recall signing up for this particular insurance plan.    TEST/EVENTS :  11/03/19 relative eosinophil count of 52% and absolute eosinophils of 5.7.   RAST profile was positive across the board,   IgE directed to Aspergillus fumigatus Aspergillus September 29 close versicolor.   ANCA panel was negative, total IgE was 3840  Eosinophil count after prednisone therapy: Relative eosinophils 10% and absolute eosinophils 0.5.  Allergies  Allergen Reactions  . Amoxicillin Hives    Hives all over the body. [facility] 11/05/19. Has patient had a PCN reaction causing immediate rash, facial/tongue/throat swelling, SOB or lightheadedness with hypotension: Yes Has patient had a PCN reaction causing severe rash involving mucus membranes or skin necrosis: No Has patient had a PCN reaction that required hospitalization: No Has patient had a PCN reaction occurring within the last 10 years: Yes If all of the above answers are "NO", then may proceed with Cephalosporin use.  [place] Doxycycline   . Fish Allergy Swelling  . Shellfish Allergy Swelling  . Penicillins Hives    Has patient had a PCN reaction causing immediate rash,  facial/tongue/throat swelling, SOB or lightheadedness with hypotension: Yes Has patient had a PCN reaction causing severe rash involving mucus membranes or skin necrosis: No Has patient had a PCN reaction that required hospitalization: No Has patient had a PCN reaction occurring within the last 10 years: Yes If all of the above answers are "NO", then may proceed  with Cephalosporin use.    Immunization History  Administered Date(s) Administered  . Pneumococcal Polysaccharide-23 10/19/2017    Past Medical History:  Diagnosis Date  . Asthma   . Chronic bronchitis (HCC)   . COPD (chronic obstructive pulmonary disease) (HCC)     Tobacco History: Social History   Tobacco Use  Smoking Status Former Smoker  . Packs/day: 0.25  . Years: 14.00  . Pack years: 3.50  . Types: Cigarettes  . Start date: 2006  . Quit date: 08/2018  . Years since quitting: 1.5  Smokeless Tobacco Never Used   Counseling given: Not Answered   Outpatient Medications Prior to Visit  Medication Sig Dispense Refill  . albuterol (PROVENTIL) (2.5 MG/3ML) 0.083% nebulizer solution Take 2.5 mg by nebulization every 4 (four) hours as needed for wheezing or shortness of breath.    Marland Kitchen albuterol (VENTOLIN HFA) 108 (90 Base) MCG/ACT inhaler Inhale 2 puffs into the lungs every 6 (six) hours as needed for shortness of breath. 6.7 g 3  . Aspirin-Salicylamide-Caffeine (ARTHRITIS STRENGTH BC POWDER PO) Take 1 Package by mouth in the morning and at bedtime.    . budesonide-formoterol (SYMBICORT) 160-4.5 MCG/ACT inhaler Inhale 2 puffs into the lungs 2 (two) times daily. 1 each 5  . cetirizine (ZYRTEC) 10 MG tablet Take 10 mg by mouth daily.    . GUAIFENESIN 1200 PO Take 1 tablet by mouth daily.    . Itraconazole 200 MG TABS Take 1 tablet by mouth in the morning and at bedtime. 60 tablet 0  . montelukast (SINGULAIR) 10 MG tablet Take 1 tablet (10 mg total) by mouth at bedtime. 30 tablet 5  . predniSONE (DELTASONE) 10 MG tablet Take 1 tablet (10 mg total) by mouth daily with breakfast. 30 tablet 5  . albuterol (PROVENTIL) (2.5 MG/3ML) 0.083% nebulizer solution Take 3 mLs (2.5 mg total) by nebulization every 4 (four) hours as needed for wheezing or shortness of breath. Use twice daily and as needed for coughing 75 mL 5  . omalizumab (XOLAIR) 150 MG/ML prefilled syringe Inject 300 mg into  the skin every 14 (fourteen) days. Along with 75 mg syringe. (Patient not taking: Reported on 01/07/2020) 4 mL 2  . omalizumab (XOLAIR) 75 MG/0.5ML prefilled syringe Inject 75 mg into the skin every 14 (fourteen) days. Along with 150 mg x2 syringe. (Patient not taking: Reported on 01/07/2020) 1 mL 2  . omeprazole (PRILOSEC) 40 MG capsule 1 tablet daily. Do not take at same time as itraconazole (Patient not taking: Reported on 02/18/2020) 30 capsule 2  . mometasone-formoterol (DULERA) 200-5 MCG/ACT AERO Inhale 2 puffs into the lungs 2 (two) times daily. (Patient not taking: Reported on 01/07/2020)     No facility-administered medications prior to visit.   Review of Systems  Review of Systems  Constitutional: Negative.   HENT: Negative.   Respiratory: Positive for cough, chest tightness, shortness of breath and wheezing.   Cardiovascular: Negative.    Physical Exam  BP 128/80 (BP Location: Right Arm, Cuff Size: Normal)   Pulse 95   Temp (!) 95.5 F (35.3 C) (Other (Comment)) Comment (Src): wrist  Ht 5\' 11"  (1.803 m)  Wt 167 lb 3.2 oz (75.8 kg)   SpO2 97%   BMI 23.32 kg/m  Physical Exam Constitutional:      Appearance: Normal appearance.  HENT:     Head: Normocephalic and atraumatic.     Mouth/Throat:     Comments: Deferred d/t masking Cardiovascular:     Rate and Rhythm: Normal rate and regular rhythm.  Pulmonary:     Effort: Pulmonary effort is normal. No respiratory distress.     Breath sounds: No stridor. Wheezing and rhonchi present.  Musculoskeletal:        General: Normal range of motion.  Skin:    General: Skin is warm and dry.  Neurological:     General: No focal deficit present.     Mental Status: He is alert and oriented to person, place, and time. Mental status is at baseline.  Psychiatric:        Mood and Affect: Mood normal.        Behavior: Behavior normal.        Thought Content: Thought content normal.        Judgment: Judgment normal.      Lab  Results:  CBC    Component Value Date/Time   WBC 8.0 12/21/2019 0849   RBC 4.83 12/21/2019 0849   HGB 15.4 12/21/2019 0849   HCT 45.1 12/21/2019 0849   PLT 242 12/21/2019 0849   MCV 93.4 12/21/2019 0849   MCH 31.9 12/21/2019 0849   MCHC 34.1 12/21/2019 0849   RDW 13.3 12/21/2019 0849   LYMPHSABS 1.4 12/21/2019 0849   MONOABS 0.6 12/21/2019 0849   EOSABS 0.6 (H) 12/21/2019 0849   BASOSABS 0.0 12/21/2019 0849    BMET    Component Value Date/Time   NA 136 12/21/2019 0849   K 3.7 12/21/2019 0849   CL 98 12/21/2019 0849   CO2 31 12/21/2019 0849   GLUCOSE 90 12/21/2019 0849   BUN 18 12/21/2019 0849   CREATININE 1.03 12/21/2019 0849   CALCIUM 8.9 12/21/2019 0849   GFRNONAA >60 12/21/2019 0849   GFRAA >60 12/21/2019 0849    BNP No results found for: BNP  ProBNP No results found for: PROBNP  Imaging: No results found.   Assessment & Plan:   Severe persistent acute asthmatic bronchitis Severe persistent asthma with acute exacerbation. Symptoms started 4 days ago after he ran out of Symbicort inhaler. Patient has poor medication compliance d/t cost. He originally was going to use medication management when he was uninsured but currently has media[employer]-family planning which is not covering a lot of his medications. He has not started [facility] because he can not afford EPIPEN prescription. Pharmacy ran test claim which showed that [employer] family planning will not cover cost of Epipen. We will print out patient assistance form to given to patient, he could also use [facility] coupon at [facility] to bring cost down to around $100 dollars. We have sent prescription for Symbicort in pharmacy, if he can not afford this he is to let our office know. In the mean time we gave patient a sample of AIRDUO Respiclick 232/7mcg 1 puff twice daily. He received dep-medrol 120mg  IM injection today and we are giving him a prescription for Zpack and prednisone taper for acute asthmatic bronchitis.   Plan: -  Sample AIR Duo 232/29mcg until he can get Symbicort refilled - RX zpack and prednisone taper (50mg  x 3 days; 40mg  x 3 days; 20mg  x 3 days; 10mg  x 3 days) - Patient assistance for [facility]  sent to patient/ or try [facility]RX coupon [facility] - Needs to get set up with Xolair injections     Glenford BayleyElizabeth W Croix Presley, NP 02/18/2020

## 2020-03-03 NOTE — Progress Notes (Signed)
Agree with the details of the visit as noted by Ames Dura, NP.  It will be impossible to keep him controlled if he cannot afford his medications.  Has been referred to the medication assistance clinic.  Unsure what else can be done to try to make sure he complies with medications.  We will continue to explore other avenues.  Gailen Shelter, MD [place] [facility]

## 2020-03-13 ENCOUNTER — Telehealth: Payer: Self-pay | Admitting: Pulmonary Disease

## 2020-03-13 MED ORDER — BREO ELLIPTA 200-25 MCG/INH IN AEPB: 1.0000 | INHALATION_SPRAY | Freq: Every day | RESPIRATORY_TRACT | 0 refills | Status: AC

## 2020-03-13 NOTE — Telephone Encounter (Signed)
Spoke to patient, who is requesting samples of airduo or Symbicort.  Patient is aware that we do not have a samples of either inhaler at our [place] office. I have checked with our [place] office, who verified that they have samples of airduo. Patient is unable to travel to [facility] office to pickup samples, due to transportation issues. I have checked goodrx and [facility]. While relaying this information to patient, patient's spouse stouted in the background "if I don't have money for gas then what makes you think I have money to go to a pharmacy and buy the medication". Patient is requesting for our [place] office to mail samples of airduo. I have checked with nurse supervisor, Leotis Shames, who stated that the only sample we are able to mail is Breztri.   Dr. [name], please advise. Thanks

## 2020-03-13 NOTE — Telephone Encounter (Signed)
I believe he has Medicaid should be able to get some medications covered.  He also has been referred to medication assistance.  Not sure we have many options.  We really should not be mailing samples as we are not a pharmacy.  Samples are just for patients to pick up at the office.  Only equivalent we have is Breo 200/25.  He could have a few samples of those if he wishes.

## 2020-03-13 NOTE — Telephone Encounter (Signed)
Spoke to patient, who stated that he has family medicaid, therefore his medications are not covered. He is not currently enrolled with medication management, as he did not complete a required form. He is in the process of re enrolling in this program. Patient would like samples of Breo 200. Two samples of Breo 200 have been placed up front . Nothing further needed.

## 2020-03-15 ENCOUNTER — Telehealth: Payer: Self-pay | Admitting: Pulmonary Disease

## 2020-03-15 NOTE — Telephone Encounter (Signed)
Spoke to patient, who is requesting update on letter.  Patient is aware that letter has been placed in Dr. [name] folder.  Patient is aware that he can obtain letter via mychart. Patient stated that he would obtain letter from [facility] and he will call back tomorrow if signature is needed from employer.

## 2020-03-15 NOTE — Telephone Encounter (Signed)
Spoke to patient, who is requesting a letter for his employer stating his lung condition and his need for breathing treatments.  He is exposed to dust during work, which triggers him. He does wear a mask.  Dr. [name], please advise. Thanks

## 2020-03-15 NOTE — Telephone Encounter (Signed)
Pt returning a phone call. Pt can be reached at [phone].

## 2020-03-15 NOTE — Telephone Encounter (Signed)
Letter has been written and placed in Dr. Georgann Housekeeper folder for signature.

## 2020-03-15 NOTE — Telephone Encounter (Signed)
Okay for letter stating he has lung condition and cannot be exposed to dusty environments.

## 2020-03-16 NOTE — Telephone Encounter (Signed)
Patient was able to obtain letter via mychart. Nothing further needed.

## 2020-03-28 ENCOUNTER — Other Ambulatory Visit: Payer: Self-pay | Admitting: Student

## 2020-03-28 ENCOUNTER — Emergency Department: Payer: Medicaid Other

## 2020-03-28 ENCOUNTER — Other Ambulatory Visit: Payer: Self-pay

## 2020-03-28 ENCOUNTER — Encounter: Payer: Self-pay | Admitting: *Deleted

## 2020-03-28 ENCOUNTER — Emergency Department
Admission: EM | Admit: 2020-03-28 | Discharge: 2020-03-29 | Disposition: A | Discharge status: Home / Self Care | Payer: Medicaid Other | Attending: Emergency Medicine | Admitting: Emergency Medicine

## 2020-03-28 DIAGNOSIS — J45909 Unspecified asthma, uncomplicated: Secondary | ICD-10-CM | POA: Insufficient documentation

## 2020-03-28 DIAGNOSIS — Z87891 Personal history of nicotine dependence: Secondary | ICD-10-CM | POA: Insufficient documentation

## 2020-03-28 DIAGNOSIS — Z880 Allergy status to penicillin: Secondary | ICD-10-CM | POA: Insufficient documentation

## 2020-03-28 DIAGNOSIS — Z7982 Long term (current) use of aspirin: Secondary | ICD-10-CM | POA: Insufficient documentation

## 2020-03-28 DIAGNOSIS — B9689 Other specified bacterial agents as the cause of diseases classified elsewhere: Secondary | ICD-10-CM | POA: Insufficient documentation

## 2020-03-28 DIAGNOSIS — Z7951 Long term (current) use of inhaled steroids: Secondary | ICD-10-CM | POA: Insufficient documentation

## 2020-03-28 DIAGNOSIS — L03211 Cellulitis of face: Secondary | ICD-10-CM | POA: Insufficient documentation

## 2020-03-28 DIAGNOSIS — J449 Chronic obstructive pulmonary disease, unspecified: Secondary | ICD-10-CM | POA: Insufficient documentation

## 2020-03-28 DIAGNOSIS — Z881 Allergy status to other antibiotic agents status: Secondary | ICD-10-CM | POA: Insufficient documentation

## 2020-03-28 LAB — COMPREHENSIVE METABOLIC PANEL
ALT: 29 U/L (ref 0–44)
AST: 30 U/L (ref 15–41)
Albumin: 4.4 g/dL (ref 3.5–5.0)
Alkaline Phosphatase: 52 U/L (ref 38–126)
Anion gap: 10 (ref 5–15)
BUN: 7 mg/dL (ref 6–20)
CO2: 24 mmol/L (ref 22–32)
Calcium: 9.2 mg/dL (ref 8.9–10.3)
Chloride: 104 mmol/L (ref 98–111)
Creatinine, Ser: 0.87 mg/dL (ref 0.61–1.24)
GFR, Estimated: >60 mL/min (ref >60)
Glucose, Bld: 98 mg/dL (ref 70–99)
Potassium: 4.1 mmol/L (ref 3.5–5.1)
Sodium: 138 mmol/L (ref 135–145)
Total Bilirubin: 1.2 mg/dL (ref 0.3–1.2)
Total Protein: 7.5 g/dL (ref 6.5–8.1)

## 2020-03-28 LAB — CBC WITH DIFFERENTIAL/PLATELET
Abs Immature Granulocytes: 0.04 10*3/uL (ref 0.00–0.07)
Basophils Absolute: 0.1 10*3/uL (ref 0.0–0.1)
Basophils Relative: 1 %
Eosinophils Absolute: 0.6 10*3/uL — ABNORMAL HIGH (ref 0.0–0.5)
Eosinophils Relative: 6 %
HCT: 44.2 % (ref 39.0–52.0)
Hemoglobin: 15.1 g/dL (ref 13.0–17.0)
Immature Granulocytes: 0 %
Lymphocytes Relative: 21 %
Lymphs Abs: 2.1 10*3/uL (ref 0.7–4.0)
MCH: 32 pg (ref 26.0–34.0)
MCHC: 34.2 g/dL (ref 30.0–36.0)
MCV: 93.6 fL (ref 80.0–100.0)
Monocytes Absolute: 0.8 10*3/uL (ref 0.1–1.0)
Monocytes Relative: 8 %
Neutro Abs: 6.6 10*3/uL (ref 1.7–7.7)
Neutrophils Relative %: 64 %
Platelets: 297 10*3/uL (ref 150–400)
RBC: 4.72 MIL/uL (ref 4.22–5.81)
RDW: 12.4 % (ref 11.5–15.5)
WBC: 10.1 10*3/uL (ref 4.0–10.5)
nRBC: 0 % (ref 0.0–0.2)

## 2020-03-28 MED ORDER — ALBUTEROL SULFATE (2.5 MG/3ML) 0.083% IN NEBU
2.5000 mg | INHALATION_SOLUTION | Freq: Once | RESPIRATORY_TRACT | Status: AC
  Administered 2020-03-29: 2.5 mg via RESPIRATORY_TRACT
  Filled 2020-03-28: qty 3

## 2020-03-28 MED ORDER — FENTANYL CITRATE (PF) 100 MCG/2ML IJ SOLN
50.0000 ug | Freq: Once | INTRAMUSCULAR | Status: AC
  Administered 2020-03-28: 50 ug via INTRAVENOUS
  Filled 2020-03-28: qty 2

## 2020-03-28 MED ORDER — IPRATROPIUM-ALBUTEROL 0.5-2.5 (3) MG/3ML IN SOLN
3.0000 mL | Freq: Once | RESPIRATORY_TRACT | Status: AC
  Administered 2020-03-28: 3 mL via RESPIRATORY_TRACT
  Filled 2020-03-28: qty 3

## 2020-03-28 MED ORDER — ALBUTEROL SULFATE (2.5 MG/3ML) 0.083% IN NEBU: 2.5000 mg | INHALATION_SOLUTION | RESPIRATORY_TRACT | 2 refills | Status: DC | PRN

## 2020-03-28 MED ORDER — CLINDAMYCIN HCL 150 MG PO CAPS: 300.0000 mg | ORAL_CAPSULE | Freq: Two times a day (BID) | ORAL | 0 refills | Status: DC

## 2020-03-28 MED ORDER — LIDOCAINE VISCOUS HCL 2 % MT SOLN
15.0000 mL | Freq: Once | OROMUCOSAL | Status: AC
  Administered 2020-03-29: 15 mL via OROMUCOSAL
  Filled 2020-03-28: qty 15

## 2020-03-28 MED ORDER — OXYCODONE-ACETAMINOPHEN 5-325 MG PO TABS
1.0000 | ORAL_TABLET | Freq: Once | ORAL | Status: AC
  Administered 2020-03-29: 1 via ORAL
  Filled 2020-03-28: qty 1

## 2020-03-28 MED ORDER — CLINDAMYCIN HCL 150 MG PO CAPS
300.0000 mg | ORAL_CAPSULE | Freq: Once | ORAL | Status: AC
  Administered 2020-03-29: 300 mg via ORAL
  Filled 2020-03-28: qty 2

## 2020-03-28 MED ORDER — IOHEXOL 300 MG/ML  SOLN
75.0000 mL | Freq: Once | INTRAMUSCULAR | Status: AC | PRN
  Administered 2020-03-28: 75 mL via INTRAVENOUS
  Filled 2020-03-28: qty 75

## 2020-03-28 MED ORDER — LIDOCAINE VISCOUS HCL 2 % MT SOLN: 15.0000 mL | OROMUCOSAL | 0 refills | Status: DC | PRN

## 2020-03-28 NOTE — ED Triage Notes (Signed)
Pt to ED with multiple medical complaints. Pt reports dental pain on a right lower molar that has progressed into a headache today. No fevers at home.    Pt also reporting hx of asthma with increased wheezing x 2 weeks with congestion. Pt reports using his inhaler more than normal without relief.

## 2020-03-28 NOTE — Discharge Instructions (Signed)
OPTIONS FOR DENTAL FOLLOW UP CARE ° °[place] Department of Health and Human Services - Local Safety Net Dental Clinics °http://www.ncdhhs.gov/dph/oralhealth/services/safetynetclinics.htm °  °[facility] (336-562-3123) ° °[facility] ([phone]) ° °[facility] ([phone] ext [phone]) ° °[facility] ([phone]) ° °[facility] ([phone]) °This clinic caters to the indigent population and is on a lottery system. °Location: °[facility], [address], [place] °Clinic Hours: °Wednesdays from 6pm - 9pm, patients seen by a lottery system. °For dates, call or go to [email or web address].[facility].edu/[facility]/patients/Dental-[facility] °Services: °Cleanings, fillings and simple extractions. °Payment Options: °DENTAL WORK IS FREE OF CHARGE. Bring proof of income or support. °Best way to get seen: °Arrive at 5:15 pm - this is a lottery, NOT first come/first serve, so arriving earlier will not increase your chances of being seen. °  °  °[facility] °[phone] °Select option 1 for emergencies °  °Location: °[facility], [address], [place] °Clinic Hours: °No walk-ins accepted - call the day before to schedule an appointment. °Check in times are 9:30 am and 1:30 pm. °Services: °Simple extractions, temporary fillings, pulpectomy/pulp debridement, uncomplicated abscess drainage. °Payment Options: °PAYMENT IS DUE AT THE TIME OF SERVICE.  Fee is usually $100-200, additional surgical procedures (e.g. abscess drainage) may be extra. °Cash, checks, Visa/MasterCard accepted.  Can file Medicaid if patient is covered for dental - patient should call case worker to check. °No discount for [facility] patients. °Best way to get seen: °MUST call the day before and get onto the schedule. Can usually be seen the next 1-2 days. No walk-ins accepted. °  °  °[facility] °[phone] °   °Location: °[facility], [address], [place] °Clinic Hours: °M, W, Th, F 8am or 1:30pm, Tues 9a or 1:30 - first come/first served. °Services: °Simple extractions, temporary fillings, uncomplicated abscess drainage.  You do not need to be an [place] resident. °Payment Options: °PAYMENT IS DUE AT THE TIME OF SERVICE. °Dental insurance, otherwise sliding scale - bring proof of income or support. °Depending on income and treatment needed, cost is usually $50-200. °Best way to get seen: °Arrive early as it is first come/first served. °  °  °[facility] °[phone] °  °Location: °[address] °Clinic Hours: °Mon-Thu 8a-5p °Services: °Most basic dental services including extractions and fillings. °Payment Options: °PAYMENT IS DUE AT THE TIME OF SERVICE. °Sliding scale, up to 50% off - bring proof if income or support. °Medicaid with dental option accepted. °Best way to get seen: °Call to schedule an appointment, can usually be seen within 2 weeks OR they will try to see walk-ins - show up at 8a or 2p (you may have to wait). °  °  °[facility] °[phone] °[place] RESIDENTS ONLY °  °Location: °[facility], [address], [place], [place] [address] °Clinic Hours: By appointment only. °Monday - Thursday 8am-5pm, Friday 8am-12pm °Services: Cleanings, fillings, extractions. °Payment Options: °PAYMENT IS DUE AT THE TIME OF SERVICE. °Cash, Visa or MasterCard. Sliding scale - $30 minimum per service. °Best way to get seen: °Come in to office, complete packet and make an appointment - need proof of income °or support monies for each household member and proof of [place] residence. °Usually takes about a month to get in. °  °  °[facility] °[phone] °  °Location: °[address]   [place] °Clinic Hours: Walk-in Urgent Care Dental Services are offered Monday-Friday  mornings only. °The numbers of emergencies accepted daily is limited to the number of °providers available. °Maximum 15 - Mondays, Wednesdays & Thursdays °Maximum 10 - Tuesdays & Fridays °Services: °You do not need to be a [place] resident to be seen for a dental emergency. °Emergencies are defined as pain, swelling, abnormal bleeding, or dental trauma. Walkins will receive x-rays if needed. °NOTE: Dental cleaning is not an emergency. °Payment Options: °PAYMENT IS DUE AT THE TIME OF SERVICE. °Minimum co-pay is $40.00 for uninsured patients. °Minimum co-pay is $3.00 for Medicaid with dental coverage. °Dental Insurance is accepted and must be presented at time of visit. °Medicare does not cover dental. °Forms of payment: Cash, credit card, checks. °Best way to get seen: °If not previously registered with the clinic, walk-in dental registration begins at 7:15 am and is on a first come/first serve basis. °If previously registered with the clinic, call to make an appointment. °  °  °[facility] °[phone] °[place] RESIDENTS ONLY °  °Location: °[address], [place], [place] °Clinic Hours: °Mon-Thu 10a-2p °Services: Extractions only! °Payment Options: °FREE (donations accepted) - bring proof of income or support °Best way to get seen: °Call and schedule an appointment OR come at 8am on the 1st Monday of every month (except for holidays) when it is first come/first served. °  °  °[facility] °[phone] °  °Location: °[address], [place] °Clinic Hours: °Friday mornings °Services, Payment Options, Best way to get seen: °Call for info °

## 2020-03-29 ENCOUNTER — Emergency Department
Admission: EM | Admit: 2020-03-29 | Discharge: 2020-03-29 | Disposition: A | Discharge status: Home / Self Care | Payer: Medicaid Other | Attending: Emergency Medicine | Admitting: Emergency Medicine

## 2020-03-29 ENCOUNTER — Emergency Department: Payer: Medicaid Other

## 2020-03-29 ENCOUNTER — Telehealth: Payer: Self-pay | Admitting: Pulmonary Disease

## 2020-03-29 ENCOUNTER — Other Ambulatory Visit: Payer: Self-pay

## 2020-03-29 ENCOUNTER — Emergency Department (HOSPITAL_COMMUNITY): Admission: EM | Admit: 2020-03-29 | Payer: Medicaid Other | Source: Home / Self Care

## 2020-03-29 ENCOUNTER — Inpatient Hospital Stay
Admission: EM | Admit: 2020-03-29 | Discharge: 2020-04-03 | DRG: 202 | Disposition: A | Discharge status: Home / Self Care | Payer: Medicaid Other | Attending: Internal Medicine | Admitting: Internal Medicine

## 2020-03-29 ENCOUNTER — Encounter: Payer: Self-pay | Admitting: Emergency Medicine

## 2020-03-29 DIAGNOSIS — Z9119 Patient's noncompliance with other medical treatment and regimen: Secondary | ICD-10-CM

## 2020-03-29 DIAGNOSIS — J4541 Moderate persistent asthma with (acute) exacerbation: Secondary | ICD-10-CM

## 2020-03-29 DIAGNOSIS — J4550 Severe persistent asthma, uncomplicated: Secondary | ICD-10-CM | POA: Diagnosis present

## 2020-03-29 DIAGNOSIS — L03211 Cellulitis of face: Secondary | ICD-10-CM | POA: Diagnosis present

## 2020-03-29 DIAGNOSIS — K047 Periapical abscess without sinus: Secondary | ICD-10-CM

## 2020-03-29 DIAGNOSIS — J45901 Unspecified asthma with (acute) exacerbation: Secondary | ICD-10-CM | POA: Diagnosis present

## 2020-03-29 DIAGNOSIS — M272 Inflammatory conditions of jaws: Secondary | ICD-10-CM | POA: Diagnosis present

## 2020-03-29 DIAGNOSIS — J449 Chronic obstructive pulmonary disease, unspecified: Secondary | ICD-10-CM | POA: Diagnosis present

## 2020-03-29 DIAGNOSIS — K219 Gastro-esophageal reflux disease without esophagitis: Secondary | ICD-10-CM | POA: Diagnosis present

## 2020-03-29 DIAGNOSIS — Z79899 Other long term (current) drug therapy: Secondary | ICD-10-CM

## 2020-03-29 DIAGNOSIS — Z7951 Long term (current) use of inhaled steroids: Secondary | ICD-10-CM

## 2020-03-29 DIAGNOSIS — K029 Dental caries, unspecified: Secondary | ICD-10-CM | POA: Diagnosis present

## 2020-03-29 DIAGNOSIS — Z7952 Long term (current) use of systemic steroids: Secondary | ICD-10-CM

## 2020-03-29 DIAGNOSIS — Z87891 Personal history of nicotine dependence: Secondary | ICD-10-CM | POA: Insufficient documentation

## 2020-03-29 DIAGNOSIS — K046 Periapical abscess with sinus: Secondary | ICD-10-CM | POA: Insufficient documentation

## 2020-03-29 DIAGNOSIS — Z91013 Allergy to seafood: Secondary | ICD-10-CM

## 2020-03-29 DIAGNOSIS — J322 Chronic ethmoidal sinusitis: Secondary | ICD-10-CM | POA: Diagnosis present

## 2020-03-29 DIAGNOSIS — J4551 Severe persistent asthma with (acute) exacerbation: Principal | ICD-10-CM | POA: Diagnosis present

## 2020-03-29 DIAGNOSIS — Z20822 Contact with and (suspected) exposure to covid-19: Secondary | ICD-10-CM | POA: Diagnosis present

## 2020-03-29 HISTORY — DX: Allergic bronchopulmonary aspergillosis: B44.81

## 2020-03-29 LAB — BASIC METABOLIC PANEL
Anion gap: 12 (ref 5–15)
BUN: 7 mg/dL (ref 6–20)
CO2: 25 mmol/L (ref 22–32)
Calcium: 9.3 mg/dL (ref 8.9–10.3)
Chloride: 99 mmol/L (ref 98–111)
Creatinine, Ser: 0.69 mg/dL (ref 0.61–1.24)
GFR, Estimated: >60 mL/min (ref >60)
Glucose, Bld: 100 mg/dL — ABNORMAL HIGH (ref 70–99)
Potassium: 3.5 mmol/L (ref 3.5–5.1)
Sodium: 136 mmol/L (ref 135–145)

## 2020-03-29 LAB — CBC
HCT: 45.7 % (ref 39.0–52.0)
Hemoglobin: 15.6 g/dL (ref 13.0–17.0)
MCH: 31.5 pg (ref 26.0–34.0)
MCHC: 34.1 g/dL (ref 30.0–36.0)
MCV: 92.1 fL (ref 80.0–100.0)
Platelets: 313 10*3/uL (ref 150–400)
RBC: 4.96 MIL/uL (ref 4.22–5.81)
RDW: 12.5 % (ref 11.5–15.5)
WBC: 9.5 10*3/uL (ref 4.0–10.5)
nRBC: 0 % (ref 0.0–0.2)

## 2020-03-29 LAB — TROPONIN I (HIGH SENSITIVITY): Troponin I (High Sensitivity): 6 ng/L (ref <18)

## 2020-03-29 MED ORDER — CLINDAMYCIN HCL 150 MG PO CAPS
300.0000 mg | ORAL_CAPSULE | Freq: Once | ORAL | Status: AC
  Administered 2020-03-29: 300 mg via ORAL
  Filled 2020-03-29: qty 2

## 2020-03-29 MED ORDER — SODIUM CHLORIDE 0.9 % IV SOLN
500.0000 mg | INTRAVENOUS | Status: DC
  Administered 2020-03-30 – 2020-04-02 (×4): 500 mg via INTRAVENOUS
  Filled 2020-03-29 (×5): qty 500

## 2020-03-29 MED ORDER — IPRATROPIUM-ALBUTEROL 0.5-2.5 (3) MG/3ML IN SOLN
3.0000 mL | Freq: Once | RESPIRATORY_TRACT | Status: AC
  Administered 2020-03-29: 3 mL via RESPIRATORY_TRACT
  Filled 2020-03-29: qty 3

## 2020-03-29 MED ORDER — CLINDAMYCIN PHOSPHATE 600 MG/50ML IV SOLN
600.0000 mg | Freq: Four times a day (QID) | INTRAVENOUS | Status: DC
  Administered 2020-03-29 – 2020-03-31 (×7): 600 mg via INTRAVENOUS
  Filled 2020-03-29 (×11): qty 50

## 2020-03-29 MED ORDER — ALBUTEROL (5 MG/ML) CONTINUOUS INHALATION SOLN
15.0000 mg/h | INHALATION_SOLUTION | Freq: Once | RESPIRATORY_TRACT | Status: AC
  Administered 2020-03-29: 15 mg/h via RESPIRATORY_TRACT
  Filled 2020-03-29: qty 20

## 2020-03-29 MED ORDER — CLINDAMYCIN HCL 150 MG PO CAPS: 300.0000 mg | ORAL_CAPSULE | Freq: Every day | ORAL | 0 refills | Status: AC

## 2020-03-29 MED ORDER — METHYLPREDNISOLONE SODIUM SUCC 125 MG IJ SOLR
125.0000 mg | Freq: Once | INTRAMUSCULAR | Status: AC
  Administered 2020-03-29: 125 mg via INTRAVENOUS
  Filled 2020-03-29: qty 2

## 2020-03-29 MED ORDER — SODIUM CHLORIDE 0.9 % IV BOLUS
1000.0000 mL | Freq: Once | INTRAVENOUS | Status: AC
  Administered 2020-03-29: 1000 mL via INTRAVENOUS

## 2020-03-29 MED ORDER — MAGNESIUM SULFATE 2 GM/50ML IV SOLN
2.0000 g | Freq: Once | INTRAVENOUS | Status: AC
  Administered 2020-03-29: 2 g via INTRAVENOUS
  Filled 2020-03-29: qty 50

## 2020-03-29 MED ORDER — IPRATROPIUM-ALBUTEROL 0.5-2.5 (3) MG/3ML IN SOLN
RESPIRATORY_TRACT | Status: AC
  Administered 2020-03-29: 3 mL
  Filled 2020-03-29: qty 24

## 2020-03-29 NOTE — ED Notes (Signed)
Pt's care discussed with Dr. Scotty Court, Dr. Scotty Court in aggreement with EKG, bloodwork and repeat chest x-ray

## 2020-03-29 NOTE — H&P (Signed)
Shannon Knapp:[id number] DOB: Mar 04, 1986 DOA: 03/29/2020     PCP: Patient, No Pcp Per   Outpatient Specialists:    Pulmonary Dr. [name] with pulmonology    Patient arrived to ER on 03/29/20 at 2139 Referred by Attending Shaune Pollack, MD   Patient coming from: home Lives  With family    Chief Complaint:   Chief Complaint  Patient presents with  . Shortness of Breath  . Asthma    HPI: Shannon Knapp is a 34 y.o. male with medical history significant of chronic asthma steroid dependent/ COPD    Presented with worsening shortness of breath Patient has been doing with acute asthma exacerbation intermittently. Last time seen by pulmonology for the same in beginning of October. Patient is chronically steroid-dependent. Recently developed dental pain felt to be secondary to tooth infection.  Over right lower molar Started recently on clindamycin and his prednisone was stopped.  He has been wheezing for the past 2 weeks with a lot of congestion has been trying to use his inhalers but does not seem to help patient's pulmonology felt that he was overusing his inhaler and developing  tachyphylaxis He was seen early in emergency department today and was able to be discharged home Patient came back because he continued to have severe shortness of breath He is currently using Symbicort albuterol nebs albuterol MDI No fevers or chills no nausea vomiting no diarrhea.  Infectious risk factors:  Reports  shortness of breath    Has  NOt been vaccinated against COVID (consult and spoke at length regarding importance of vaccination patient will think about it)   Initial COVID TEST  NEGATIVE   Lab Results  Component Value Date   SARSCOV2NAA Not Detected 02/07/2020   SARSCOV2NAA NEGATIVE 10/25/2019   SARSCOV2NAA NEGATIVE 10/22/2019   SARSCOV2NAA NEGATIVE 10/29/2018    Regarding pertinent Chronic problems:    Asthma -poorly controlled on home inhalers/ nebs  chronic steroids  followed by pulmonology     While in ER: Noted to be tachypneic tachycardic up to 124 Significant wheezing decreased breath sounds Chest x-ray unremarkable CT face showing large carious and first right mandibular molar with small periapical lucency Some facial cellulitis no drainable fluid collection  Patient was started on IV steroids IV magnesium continuous nebulizer with some improvement but still continues to have significant dyspnea  Hospitalist was called for admission for asthma exacerbation  The following Work up has been ordered so far:  Orders Placed This Encounter  Procedures  . Critical Care  . Consult to hospitalist  ALL PATIENTS BEING ADMITTED/HAVING PROCEDURES NEED COVID-19 SCREENING     Following Medications were ordered in ER: Medications  clindamycin (CLEOCIN) IVPB 600 mg (has no administration in time range)  methylPREDNISolone sodium succinate (SOLU-MEDROL) 125 mg/2 mL injection 125 mg (125 mg Intravenous Given 03/29/20 2158)  albuterol (PROVENTIL,VENTOLIN) solution continuous neb (15 mg/hr Nebulization Given 03/29/20 2232)  magnesium sulfate IVPB 2 g 50 mL (0 g Intravenous Stopped 03/29/20 2210)  sodium chloride 0.9 % bolus 1,000 mL (1,000 mLs Intravenous New Bag/Given 03/29/20 2157)  ipratropium-albuterol (DUONEB) 0.5-2.5 (3) MG/3ML nebulizer solution (3 mLs  Given 03/29/20 2153)        Consult Orders  (From admission, onward)         Start     Ordered   03/29/20 2234  Consult to hospitalist  ALL PATIENTS BEING ADMITTED/HAVING PROCEDURES NEED COVID-19 SCREENING  Once       Comments: ALL PATIENTS BEING  ADMITTED/HAVING PROCEDURES NEED COVID-19 SCREENING  Provider:  (Not yet assigned)  Question Answer Comment  Place call to: Hospitalist   Reason for Consult Admit      03/29/20 2233          Significant initial  Findings: Abnormal Labs Reviewed  BLOOD GAS, VENOUS - Abnormal; Notable for the following components:      Result  Value   pO2, Ven 46.0 (*)    Acid-base deficit 4.0 (*)    All other components within normal limits      Otherwise labs showing:    Recent Labs  Lab 03/28/20 2120 03/29/20 1501  NA 138 136  K 4.1 3.5  CO2 24 25  GLUCOSE 98 100*  BUN 7 7  CREATININE 0.87 0.69  CALCIUM 9.2 9.3    Cr    stable,    Lab Results  Component Value Date   CREATININE 0.69 03/29/2020   CREATININE 0.87 03/28/2020   CREATININE 1.03 12/21/2019    Recent Labs  Lab 03/28/20 2120  AST 30  ALT 29  ALKPHOS 52  BILITOT 1.2  PROT 7.5  ALBUMIN 4.4   Lab Results  Component Value Date   CALCIUM 9.3 03/29/2020   PHOS 3.0 10/20/2017     WBC       Component Value Date/Time   WBC 9.5 03/29/2020 1501   LYMPHSABS 2.1 03/28/2020 2120   MONOABS 0.8 03/28/2020 2120   EOSABS 0.6 (H) 03/28/2020 2120   BASOSABS 0.1 03/28/2020 2120     Plt: Lab Results  Component Value Date   PLT 313 03/29/2020   COVID-19 Labs  No results for input(s): DDIMER, FERRITIN, LDH, CRP in the last 72 hours.  Lab Results  Component Value Date   SARSCOV2NAA Not Detected 02/07/2020   SARSCOV2NAA NEGATIVE 10/25/2019   SARSCOV2NAA NEGATIVE 10/22/2019   SARSCOV2NAA NEGATIVE 10/29/2018    Venous  Blood Gas result:  pH 7.30 pCO2 46     HG/HCT   stable,      Component Value Date/Time   HGB 15.6 03/29/2020 1501   HCT 45.7 03/29/2020 1501   MCV 92.1 03/29/2020 1501      Troponin 6    ECG: Ordered Personally reviewed by me showing: HR : 114 Rhythm:   Sinus tachycardia   , no evidence of ischemic changes QTC 424    UA not ordered    Ordered   CXR - NON acute     ED Triage Vitals  Enc Vitals Group     BP 03/29/20 2147 (!) 157/114     Pulse Rate 03/29/20 2147 (!) 124     Resp 03/29/20 2147 (!) 30     Temp 03/29/20 2149 98.2 F (36.8 C)     Temp Source 03/29/20 2149 Oral     SpO2 03/29/20 2147 94 %     Weight 03/29/20 2148 165 lb (74.8 kg)     Height 03/29/20 2148 5\' 11"  (1.803 m)     Head  Circumference --      Peak Flow --      Pain Score 03/29/20 2148 0     Pain Loc --      Pain Edu? --      Excl. in GC? --   TMAX(24)@       Latest  Blood pressure (!) 155/87, pulse 98, temperature 98.2 F (36.8 C), temperature source Oral, resp. rate 15, height 5\' 11"  (1.803 m), weight 74.8 kg, SpO2 100 %.  Review of Systems:    Pertinent positives include:   shortness of breath at rest dyspnea on exertion  wheezing. Constitutional:  No weight loss, night sweats, Fevers, chills, fatigue, weight loss  HEENT:  No headaches, Difficulty swallowing,Tooth/dental problems,Sore throat,  No sneezing, itching, ear ache, nasal congestion, post nasal drip,  Cardio-vascular:  No chest pain, Orthopnea, PND, anasarca, dizziness, palpitations.no Bilateral lower extremity swelling  GI:  No heartburn, indigestion, abdominal pain, nausea, vomiting, diarrhea, change in bowel habits, loss of appetite, melena, blood in stool, hematemesis Resp:  no . No , No excess mucus, no productive cough, No non-productive cough, No coughing up of blood.No change in color of mucus.No  Skin:  no rash or lesions. No jaundice GU:  no dysuria, change in color of urine, no urgency or frequency. No straining to urinate.  No flank pain.  Musculoskeletal:  No joint pain or no joint swelling. No decreased range of motion. No back pain.  Psych:  No change in mood or affect. No depression or anxiety. No memory loss.  Neuro: no localizing neurological complaints, no tingling, no weakness, no double vision, no gait abnormality, no slurred speech, no confusion  All systems reviewed and apart from HOPI all are negative  Past Medical History:   Past Medical History:  Diagnosis Date  . Asthma   . Chronic bronchitis (HCC)   . COPD (chronic obstructive pulmonary disease) (HCC)       Past Surgical History:  Procedure Laterality Date  . VIDEO BRONCHOSCOPY Bilateral 10/24/2017   Procedure: VIDEO BRONCHOSCOPY WITHOUT  FLUORO;  Surgeon: Alyson ReedyYacoub, Wesam G, MD;  [facility];  Service: Cardiopulmonary;  Laterality: Bilateral;    Social History:  Ambulatory   independently       reports that he quit smoking about 19 months ago. His smoking use included cigarettes. He started smoking about 15 years ago. He has a 3.50 pack-year smoking history. He has never used smokeless tobacco. He reports previous alcohol use of about 15.0 standard drinks of alcohol per week. He reports that he does not use drugs.  Family History:   Family History  Problem Relation Age of Onset  . Hypertension Mother   . Diabetes Mother   . Asthma Father   . Hypertension Father     Allergies: Allergies  Allergen Reactions  . Amoxicillin Hives    Hives all over the body. [facility] [employer] Has patient had a PCN reaction causing immediate rash, facial/tongue/throat swelling, SOB or lightheadedness with hypotension: Yes Has patient had a PCN reaction causing severe rash involving mucus membranes or skin necrosis: No Has patient had a PCN reaction that required hospitalization: No Has patient had a PCN reaction occurring within the last 10 years: Yes If all of the above answers are "NO", then may proceed with Cephalosporin use.  Marland Kitchen. Doxycycline   . Fish Allergy Swelling  . Shellfish Allergy Swelling  . Penicillins Hives    Has patient had a PCN reaction causing immediate rash, facial/tongue/throat swelling, SOB or lightheadedness with hypotension: Yes Has patient had a PCN reaction causing severe rash involving mucus membranes or skin necrosis: No Has patient had a PCN reaction that required hospitalization: No Has patient had a PCN reaction occurring within the last 10 years: Yes If all of the above answers are "NO", then may proceed with Cephalosporin use.     Prior to Admission medications   Medication Sig Start Date End Date Taking? Authorizing Provider  albuterol (PROVENTIL) (2.5 MG/3ML) 0.083%  nebulizer  solution Take 3 mLs (2.5 mg total) by nebulization every 4 (four) hours as needed for wheezing or shortness of breath. Use twice daily and as needed for coughing 01/07/20 02/06/20  Parrett, Tammy S, NP  albuterol (PROVENTIL) (2.5 MG/3ML) 0.083% nebulizer solution Take 2.5 mg by nebulization every 4 (four) hours as needed for wheezing or shortness of breath.    [provider]  albuterol (PROVENTIL) (2.5 MG/3ML) 0.083% nebulizer solution Take 3 mLs (2.5 mg total) by nebulization every 4 (four) hours as needed for wheezing or shortness of breath. 03/28/20 03/28/21  Lucy Chris, PA  albuterol (VENTOLIN HFA) 108 (90 Base) MCG/ACT inhaler Inhale 2 puffs into the lungs every 6 (six) hours as needed for shortness of breath. 01/07/20   Parrett, Virgel Bouquet, NP  Aspirin-Salicylamide-Caffeine (ARTHRITIS STRENGTH BC POWDER PO) Take 1 Package by mouth in the morning and at bedtime.    [provider]  budesonide-formoterol (SYMBICORT) 160-4.5 MCG/ACT inhaler Inhale 2 puffs into the lungs 2 (two) times daily. 02/18/20 03/19/20  Salena Saner, MD  cetirizine (ZYRTEC) 10 MG tablet Take 10 mg by mouth daily.    [provider]  clindamycin (CLEOCIN) 150 MG capsule Take 2 capsules (300 mg total) by mouth in the morning and at bedtime for 10 days. 03/28/20 04/07/20  Lucy Chris, PA  clindamycin (CLEOCIN) 150 MG capsule Take 2 capsules (300 mg total) by mouth daily with lunch for 8 days. 03/30/20 04/07/20  Menshew, Charlesetta Ivory, PA-C  Fluticasone-Salmeterol,sensor, (AIRDUO DIGIHALER) [id number] MCG/ACT AEPB Inhale 1 puff into the lungs in the morning and at bedtime. 02/18/20   Glenford Bayley, NP  GUAIFENESIN 1200 PO Take 1 tablet by mouth daily.    [provider]  Itraconazole 200 MG TABS Take 1 tablet by mouth in the morning and at bedtime. 01/28/20   Salena Saner, MD  lidocaine (XYLOCAINE) 2 % solution Use as directed 15 mLs in the mouth or throat as needed for mouth  pain. 03/28/20   Lucy Chris, PA  montelukast (SINGULAIR) 10 MG tablet Take 1 tablet (10 mg total) by mouth at bedtime. 11/03/19   Parrett, Virgel Bouquet, NP  omalizumab Geoffry Paradise) 150 MG/ML prefilled syringe Inject 300 mg into the skin every 14 (fourteen) days. Along with 75 mg syringe. Patient not taking: Reported on 01/07/2020 12/08/19   Yopp, Amber C, RPH-CPP  omalizumab Geoffry Paradise) 75 MG/0.5ML prefilled syringe Inject 75 mg into the skin every 14 (fourteen) days. Along with 150 mg x2 syringe. Patient not taking: Reported on 01/07/2020 12/08/19   Yopp, Amber C, RPH-CPP  omeprazole (PRILOSEC) 40 MG capsule 1 tablet daily. Do not take at same time as itraconazole Patient not taking: Reported on 02/18/2020 12/29/19   Salena Saner, MD  predniSONE (DELTASONE) 10 MG tablet Take 1 tablet (10 mg total) by mouth daily with breakfast. 01/07/20   Parrett, Virgel Bouquet, NP  predniSONE (DELTASONE) 10 MG tablet Take 50mg  po daily x 3 days; 40mg  daily x 3 days; 30mg  daily x3 days; 20mg  daily x3 days; then resume 10mg  daily 02/18/20   , NP   Physical Exam: Vitals with BMI 03/29/2020 03/29/2020 03/29/2020  Height - - 5\' 11"   Weight - - 165 lbs  BMI - - 23.02  Systolic 155 162 -  Diastolic 87 93 -  Pulse 98 104 -     1. General:  in No  Acute distress    Chronically ill -appearing 2. Psychological:  Alert and  Oriented 3. Head/ENT:      Dry Mucous Membranes                          Head Non traumatic, neck supple                        Poor Dentition swelling of right cheek no significant induration or redness 4. SKIN:   decreased Skin turgor,  Skin clean Dry and intact no rash 5. Heart: Regular rate and rhythm no Murmur, no Rub or gallop 6. Lungs: Some wheezes no crackles diminished air movement 7. Abdomen: Soft,  non-tender, Non distended  bowel sounds present 8. Lower extremities: no clubbing, cyanosis, no  edema 9. Neurologically Grossly intact, moving all 4 extremities equally   10. MSK:  Normal range of motion   All other LABS:     Recent Labs  Lab 03/28/20 2120 03/29/20 1501  WBC 10.1 9.5  NEUTROABS 6.6  --   HGB 15.1 15.6  HCT 44.2 45.7  MCV 93.6 92.1  PLT 297 313     Recent Labs  Lab 03/28/20 2120 03/29/20 1501  NA 138 136  K 4.1 3.5  CL 104 99  CO2 24 25  GLUCOSE 98 100*  BUN 7 7  CREATININE 0.87 0.69  CALCIUM 9.2 9.3     Recent Labs  Lab 03/28/20 2120  AST 30  ALT 29  ALKPHOS 52  BILITOT 1.2  PROT 7.5  ALBUMIN 4.4       Cultures:    Component Value Date/Time   SDES BRONCHIAL ALVEOLAR LAVAGE 10/24/2017 1555   SPECREQUEST NONE 10/24/2017 1555   CULT  10/24/2017 1555    RARE Consistent with normal respiratory flora. Performed at [facility], [address], [place], [place] [address]    REPTSTATUS 10/27/2017 FINAL 10/24/2017 1555     Radiological Exams on Admission: DG Chest 2 View  Result Date: 03/29/2020 CLINICAL DATA:  Shortness of breath EXAM: CHEST - 2 VIEW COMPARISON:  None. FINDINGS: The heart size and mediastinal contours are within normal limits. Both lungs are clear. The visualized skeletal structures are unremarkable. IMPRESSION: No active cardiopulmonary disease. Electronically Signed   By: Jonna Clark M.D.   On: 03/29/2020 15:44   DG Chest 2 View  Result Date: 03/28/2020 CLINICAL DATA:  Shortness of breath EXAM: CHEST - 2 VIEW COMPARISON:  12/21/2019 FINDINGS: The heart size and mediastinal contours are within normal limits. Both lungs are clear. The visualized skeletal structures are unremarkable. IMPRESSION: No active cardiopulmonary disease. Electronically Signed   By: Deatra Robinson M.D.   On: 03/28/2020 19:36   CT Maxillofacial W Contrast  Result Date: 03/28/2020 CLINICAL DATA:  Right lower dental pain.  Headache. EXAM: CT MAXILLOFACIAL WITH CONTRAST TECHNIQUE: Multidetector CT imaging of the maxillofacial structures was performed with intravenous contrast. Multiplanar CT image reconstructions were also  generated. CONTRAST:  75mL OMNIPAQUE IOHEXOL 300 MG/ML  SOLN COMPARISON:  None. FINDINGS: Osseous: There is a large carie of the first right mandibular molar with a small periapical lucency at its anterior root. No fracture. Orbits: Negative. No traumatic or inflammatory finding. Sinuses: Diffuse paranasal sinus disease with complete opacification of the ethmoid and frontal sinuses. Soft tissues: Moderate right facial soft tissues swelling, primarily located anterior to the right mandible. No drainable fluid collection. Limited intracranial: No significant or unexpected finding. IMPRESSION: 1. Large carie of the first right  mandibular molar with a small periapical lucency at its anterior root. 2. Moderate right facial soft tissues swelling, primarily located anterior to the right mandible, consistent with odontogenic cellulitis. No drainable fluid collection. 3. Diffuse paranasal sinus disease with complete opacification of the ethmoid and frontal sinuses. Electronically Signed   By: Deatra Robinson M.D.   On: 03/28/2020 23:20    Chart has been reviewed   Assessment/Plan   34 y.o. male with medical history significant of chronic asthma steroid dependent/ COPD  Admitted for asthma exacerbation, tooth infection  Present on Admission: . Severe persistent asthma/. Asthma exacerbation -  Will initiate: Steroid taper  -  Antibiotics   - Albuterol  PRN, - scheduled duoneb,  flutter valve Titrate O2 to saturation >90%. Follow patients respiratory status. Respiratory panel  PCR  VBG no evidence of hypercarbia - Pulmonology consult in AM  Currently mentating well no evidence of symptomatic hypercarbia   . Odontogenic infection of jaw -continue clindamycin patient will need to have follow-up with facial maxillary surgery/dentistry as an outpatient   Other plan as per orders.  DVT prophylaxis:   Lovenox       Code Status:    Code Status: Prior FULL CODE as per patient   I had personally discussed  CODE STATUS with patient     Family Communication:   Family not at  Bedside    Disposition Plan:     To home once workup is complete and patient is stable   Following barriers for discharge:                                                       Will need to be able to tolerate PO                                                    Will need consultants to evaluate patient prior to discharge   Consults called:   Pulmonology sent smart chat to Dr. Marcos Eke and Karna Christmas  Admission status:  ED Disposition    ED Disposition Condition Comment   Admit  Hospital Area: [facility] [[id number]]  Level of Care: Progressive Cardiac [106]  Admit to Progressive based on following criteria: RESPIRATORY PROBLEMS hypoxemic/hypercapnic respiratory failure that is responsive to NIPPV (BiPAP) or High Flow Nasal Cannula (6-80 lpm). Frequent assessment/intervention, no > Q2 hrs < Q4 hrs, to maintain oxygenation and pulmonary hygiene.  Covid Evaluation: Confirmed COVID Negative  Diagnosis: Asthma exacerbation [025852]  Admitting Physician: Therisa Doyne [[id number]]  Attending Physician: Therisa Doyne [[id number]]  Estimated length of stay: 3 - 4 days  Certification:: I certify this patient will need inpatient services for at least 2 midnights          inpatient     I Expect 2 midnight stay secondary to severity of patient's current illness need for inpatient interventions justified by the following:  hemodynamic instability despite optimal treatment (tachycardia  Tachypnea )   Severe lab/radiological/exam abnormalities including:   Severe tachypnea and extensive comorbidities including:   COPD/asthma   That are currently affecting medical management.   I expect  patient to be hospitalized for 2 midnights requiring inpatient  medical care.  Patient is at high risk for adverse outcome (such as loss of life or disability) if not treated.  Indication for inpatient stay as follows:   Increased work of breathing Respiratory distress Need for IV antibiotics, IV fluids IV steroids    Level of care   progressive tele indefinitely please discontinue once patient no longer qualifies COVID-19 Labs    Lab Results  Component Value Date   SARSCOV2NAA NEGATIVE 03/29/2020     Precautions: admitted as  Covid Negative   PPE: Used by the provider:   P100  eye Goggles,  Gloves     Denijah Karrer 03/29/2020, 1:07 AM    [facility]     after 2 AM please page floor coverage PA If 7AM-7PM, please contact the day team taking care of the patient using [email or web address]   Patient was evaluated in the context of the global COVID-19 pandemic, which necessitated consideration that the patient might be at risk for infection with the SARS-CoV-2 virus that causes COVID-19. Institutional protocols and algorithms that pertain to the evaluation of patients at risk for COVID-19 are in a state of rapid change based on information released by regulatory bodies including the CDC and federal and state organizations. These policies and algorithms were followed during the patient's care.

## 2020-03-29 NOTE — Telephone Encounter (Signed)
Lm for patient's spouse.  °

## 2020-03-29 NOTE — ED Provider Notes (Signed)
[facility] Emergency Department Provider Note  ____________________________________________   First MD Initiated Contact with Patient 03/28/20 2057     (approximate)  I have reviewed the triage vital signs and the nursing notes.   HISTORY  Chief Complaint Dental Pain and Asthma   HPI Shannon Knapp is a 34 y.o. male who presents emergency department with multiple medical complaints.    #1:  Patient has significant persistent asthma with prescription for multiple daily medications.  His asthma is currently poorly controlled.  He sees Dr. [name] with pulmonology.  He is supposed to see her in 2 days.  He states that he was unable to afford most of his medications, including a daily oral dose of steroid.  Due to this, he was using his albuterol nebulizer more frequently, and has since run out of the albuterol solution.  He has prescriptions for his medications, but states he is unable to afford them.  He has been previously referred to medication management for this but has had difficulty with the paperwork process.  Today, he states that he began having some increased shortness of breath and wheezing.  This has been associated with an intermittent dry cough.  He denies any chest pain.  Symptoms are worse with exertion and improved with rest but do not ever completely resolve.  #2: Patient has right lower sided dental pain with radiation through the jaw and into the right side of his head.  He states they.  He denies any fevers.  He has had a known dental cavity for some time, but has not had pain like this before he does not have a regular dentist that he sees.  Shortly after the pain began, he noted swelling in the right lower side of his jaw and a few hours ago developed a right-sided headache.  He does endorse having feelings of being hot and then having chills but has not had a specific fever.  He denies known Covid exposure.         Past Medical  History:  Diagnosis Date  . Asthma   . Chronic bronchitis (HCC)   . COPD (chronic obstructive pulmonary disease) [facility]     Patient Active Problem List   Diagnosis Date Noted  . 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 Community acquired pneumonia of left lower lobe of lung 10/25/2019  . Medication management 04/30/2019  . Healthcare maintenance 11/26/2018  . Severe persistent asthma 05/28/2018  . Acute febrile illness 04/10/2018  . Upper airway cough syndrome   . Hypoxemia   . Syncope   . Allergic rhinitis   . Cough   . Hemoptysis   . Protein-calorie malnutrition, severe 10/20/2017  . Acute respiratory failure with hypoxemia (HCC) 10/20/2017  . Acute respiratory distress   . Asthma exacerbation   . Status asthmaticus 10/18/2017  . Severe persistent acute asthmatic bronchitis 10/17/2017  . Elevated blood pressure reading without diagnosis of hypertension 10/17/2017  . Respiratory distress 10/17/2017    Past Surgical History:  Procedure Laterality Date  . VIDEO BRONCHOSCOPY Bilateral 10/24/2017   Procedure: VIDEO BRONCHOSCOPY WITHOUT FLUORO;  Surgeon: Alyson Reedy, MD;  Location: [facility] ENDOSCOPY;  Service: Cardiopulmonary;  Laterality: Bilateral;    Prior to Admission medications   Medication Sig Start Date End Date Taking? Authorizing Provider  albuterol (PROVENTIL) (2.5 MG/3ML) 0.083% nebulizer solution Take 3 mLs (2.5 mg total) by nebulization every 4 (four) hours as needed for wheezing or shortness of breath. Use twice daily  and as needed for coughing 01/07/20 02/06/20  Parrett, Virgel Bouquet, NP  albuterol (PROVENTIL) (2.5 MG/3ML) 0.083% nebulizer solution Take 2.5 mg by nebulization every 4 (four) hours as needed for wheezing or shortness of breath.    [provider]  albuterol (PROVENTIL) (2.5 MG/3ML) 0.083% nebulizer solution Take 3 mLs (2.5 mg total) by nebulization every 4 (four) hours as needed for wheezing or shortness of breath. 03/28/20  03/28/21  Lucy Chris, PA  albuterol (VENTOLIN HFA) 108 (90 Base) MCG/ACT inhaler Inhale 2 puffs into the lungs every 6 (six) hours as needed for shortness of breath. 01/07/20   Parrett, Virgel Bouquet, NP  Aspirin-Salicylamide-Caffeine (ARTHRITIS STRENGTH BC POWDER PO) Take 1 Package by mouth in the morning and at bedtime.    [provider]  azithromycin (ZITHROMAX) 250 MG tablet Zpack taper as directed 02/18/20   Glenford Bayley, NP  budesonide-formoterol [facility]) 160-4.5 MCG/ACT inhaler Inhale 2 puffs into the lungs 2 (two) times daily. 02/18/20 03/19/20  Salena Saner, MD  cetirizine (ZYRTEC) 10 MG tablet Take 10 mg by mouth daily.    [provider]  clindamycin (CLEOCIN) 150 MG capsule Take 2 capsules (300 mg total) by mouth in the morning and at bedtime for 10 days. 03/28/20 04/07/20  Lucy Chris, PA  Fluticasone-Salmeterol,sensor, (AIRDUO DIGIHALER) [phone] MCG/ACT AEPB Inhale 1 puff into the lungs in the morning and at bedtime. 02/18/20   Glenford Bayley, NP  GUAIFENESIN 1200 PO Take 1 tablet by mouth daily.    [provider]  Itraconazole 200 MG TABS Take 1 tablet by mouth in the morning and at bedtime. 01/28/20   Salena Saner, MD  lidocaine (XYLOCAINE) 2 % solution Use as directed 15 mLs in the mouth or throat as needed for mouth pain. 03/28/20   Lucy Chris, PA  montelukast (SINGULAIR) 10 MG tablet Take 1 tablet (10 mg total) by mouth at bedtime. 11/03/19   Parrett, Virgel Bouquet, NP  omalizumab Geoffry Paradise) 150 MG/ML prefilled syringe Inject 300 mg into the skin every 14 (fourteen) days. Along with 75 mg syringe. Patient not taking: Reported on 01/07/2020 12/08/19   Yopp, Amber C, RPH-CPP  omalizumab Geoffry Paradise) 75 MG/0.5ML prefilled syringe Inject 75 mg into the skin every 14 (fourteen) days. Along with 150 mg x2 syringe. Patient not taking: Reported on 01/07/2020 12/08/19   Yopp, Amber C, RPH-CPP  omeprazole (PRILOSEC) 40 MG capsule 1 tablet daily.  Do not take at same time as itraconazole Patient not taking: Reported on 02/18/2020 12/29/19   Salena Saner, MD  predniSONE (DELTASONE) 10 MG tablet Take 1 tablet (10 mg total) by mouth daily with breakfast. 01/07/20   Parrett, Virgel Bouquet, NP  predniSONE (DELTASONE) 10 MG tablet Take 50mg  po daily x 3 days; 40mg  daily x 3 days; 30mg  daily x3 days; 20mg  daily x3 days; then resume 10mg  daily 02/18/20   , NP    Allergies Amoxicillin, Doxycycline, Fish allergy, Shellfish allergy, and Penicillins  Family History  Problem Relation Age of Onset  . Hypertension Mother   . Diabetes Mother   . Asthma Father   . Hypertension Father     Social History Social History   Tobacco Use  . Smoking status: Former Smoker    Packs/day: 0.25    Years: 14.00    Pack years: 3.50    Types: Cigarettes    Start date: 2006    Quit date: 08/2018    Years since quitting: 1.6  .  Smokeless tobacco: Never Used  Vaping Use  . Vaping Use: Never used  Substance Use Topics  . Alcohol use: Not Currently    Alcohol/week: 15.0 standard drinks    Types: 15 Cans of beer per week  . Drug use: No    Review of Systems Constitutional: +chills, -fever Eyes: No visual changes. ENT: + Right sided dental pain, + right-sided jaw pain no sore throat. Cardiovascular: Denies chest pain. Respiratory: + shortness of breath, + cough. Gastrointestinal: No abdominal pain.  No nausea, no vomiting.  No diarrhea.  No constipation. Genitourinary: Negative for dysuria. Musculoskeletal: Negative for back pain. Skin: Negative for rash. Neurological: + headaches, negative for focal weakness or numbness.  ____________________________________________   PHYSICAL EXAM:  VITAL SIGNS: ED Triage Vitals  Enc Vitals Group     BP 03/28/20 1907 (!) 155/89     Pulse Rate 03/28/20 1907 93     Resp 03/28/20 1907 16     Temp 03/28/20 1907 99.1 F (37.3 C)     Temp Source 03/28/20 1907 Oral     SpO2 03/28/20 1907 96 %      Weight 03/28/20 1907 172 lb (78 kg)     Height 03/28/20 1907 5\' 11"  (1.803 m)     Head Circumference --      Peak Flow --      Pain Score 03/28/20 1911 7     Pain Loc --      Pain Edu? --      Excl. in GC? --    Constitutional: Alert and oriented.  Nontoxic-appearing and in no acute distress. Eyes: Conjunctivae are normal. PERRL. EOMI. Head: Atraumatic. Nose: No congestion/rhinnorhea. Mouth/Throat: There is presence of significant dental caries throughout the mouth, worse on the right lower side with the posterior molar degraded by approximately 50%.  There is no obvious peridental abscess.  There is facial swelling along the border of the right lower jaw extending to approximately midline of the chin.  This area is tender to palpation.  There is no oropharynx erythema or tongue swelling.  Patient unable to open jaw through full range of motion secondary to pain on the right side. Neck: No stridor.   Lymphatic: No cervical lymphadenopathy.   Cardiovascular: Normal rate, regular rhythm. Grossly normal heart sounds.  Good peripheral circulation. Respiratory: Mild increase in respiratory effort.  Significant inspiratory and expiratory wheezing heard throughout all lung fields, worse in the bases.  No rhonchi appreciated. Gastrointestinal: Soft and nontender. No distention. No abdominal bruits. No CVA tenderness. Musculoskeletal: No lower extremity tenderness nor edema.  No joint effusions. Neurologic:  Normal speech and language. No gross focal neurologic deficits are appreciated. No gait instability. Skin:  Skin is warm, dry and intact. No rash noted. Psychiatric: Mood and affect are normal. Speech and behavior are normal.  ____________________________________________   LABS (all labs ordered are listed, but only abnormal results are displayed)  Labs Reviewed  CBC WITH DIFFERENTIAL/PLATELET - Abnormal; Notable for the following components:      Result Value   Eosinophils Absolute  0.6 (*)    All other components within normal limits  COMPREHENSIVE METABOLIC PANEL   ____________________________________________  RADIOLOGY Susa RaringI, Antoinett Dorman J Kinsie Belford, personally viewed and evaluated these images (plain radiographs) as part of my medical decision making, as well as reviewing the written report by the radiologist.  ED provider interpretation: No presence of pneumonia.  Official radiology report(s): DG Chest 2 View  Result Date: 03/29/2020 CLINICAL DATA:  Shortness of breath  EXAM: CHEST - 2 VIEW COMPARISON:  None. FINDINGS: The heart size and mediastinal contours are within normal limits. Both lungs are clear. The visualized skeletal structures are unremarkable. IMPRESSION: No active cardiopulmonary disease. Electronically Signed   By: Jonna Clark M.D.   On: 03/29/2020 15:44   DG Chest 2 View  Result Date: 03/28/2020 CLINICAL DATA:  Shortness of breath EXAM: CHEST - 2 VIEW COMPARISON:  12/21/2019 FINDINGS: The heart size and mediastinal contours are within normal limits. Both lungs are clear. The visualized skeletal structures are unremarkable. IMPRESSION: No active cardiopulmonary disease. Electronically Signed   By: Deatra Robinson M.D.   On: 03/28/2020 19:36   CT Maxillofacial W Contrast  Result Date: 03/28/2020 CLINICAL DATA:  Right lower dental pain.  Headache. EXAM: CT MAXILLOFACIAL WITH CONTRAST TECHNIQUE: Multidetector CT imaging of the maxillofacial structures was performed with intravenous contrast. Multiplanar CT image reconstructions were also generated. CONTRAST:  5mL OMNIPAQUE IOHEXOL 300 MG/ML  SOLN COMPARISON:  None. FINDINGS: Osseous: There is a large carie of the first right mandibular molar with a small periapical lucency at its anterior root. No fracture. Orbits: Negative. No traumatic or inflammatory finding. Sinuses: Diffuse paranasal sinus disease with complete opacification of the ethmoid and frontal sinuses. Soft tissues: Moderate right facial soft tissues  swelling, primarily located anterior to the right mandible. No drainable fluid collection. Limited intracranial: No significant or unexpected finding. IMPRESSION: 1. Large carie of the first right mandibular molar with a small periapical lucency at its anterior root. 2. Moderate right facial soft tissues swelling, primarily located anterior to the right mandible, consistent with odontogenic cellulitis. No drainable fluid collection. 3. Diffuse paranasal sinus disease with complete opacification of the ethmoid and frontal sinuses. Electronically Signed   By: Deatra Robinson M.D.   On: 03/28/2020 23:20    ____________________________________________   INITIAL IMPRESSION / ASSESSMENT AND PLAN / ED COURSE  As part of my medical decision making, I reviewed the following data within the electronic MEDICAL RECORD NUMBER History obtained from family, Nursing notes reviewed and incorporated, Labs reviewed , Radiograph reviewed and Notes from prior ED visits        Patient is a 34 year old male who presents to the emergency department with multiple medical complaints.  See HPI for further details  #1 Asthma: Patient has poorly controlled asthma secondary to difficulty obtaining medications that are prescribed by pulmonologist.  Initiated evaluation with chest x-ray as well as physical exam.  Chest x-ray shows no focal acute pneumonia.  Physical exam is significant for inspiratory and expiratory wheezing throughout all lung fields.  Treatment was begun with a DuoNeb and followed by a second albuterol nebulizer solution.  The patient tolerated this treatment well and lung sounds did improve quite a bit.  The patient felt that he was able to breathe easier.  He has close follow-up with his pulmonologist in 2 days.  He is out of his albuterol nebulizer solution and did give him a refill of this.  Deferred steroids at this time secondary to medical complaint #2, however lengthy discussion was had with the patient that  should his respiratory issues become more significant, this may end up taking priority in which he will need steroids.  He is understanding and return precautions were given for the emergency room.  #2:  Patient has significant right-sided dental pain and swelling that began today in the location of known dental carry.  Pain is significant to touch and he is having chills though no distinct fever.  There is also presence of a moderate amount of facial swelling over the right lower jaw.  Labs were obtained which do not show an elevated white count at this time or any other significant findings.  Will obtain a CT of the maxillofacial bones with contrast to evaluate for infection/abscess.  This resulted with no distinct drainable abscess but more the presence of a facial cellulitis.  The patient has an allergy to amoxicillin, and therefore will begin treatment with clindamycin.  This was given as a printed prescription so the patient could utilize good Rx to find the location that he could fill this the cheapest.  He was also given a prescription for viscous lidocaine for outpatient treatment of the pain that he was having.  Again the patient was given strong return precautions given his multiple medical complaints and he will return if he has any worsening, or will follow up with pulmonology on Thursday.  In addition, he was given a list of local dental clinics that he can follow-up with.   ____________________________________________   FINAL CLINICAL IMPRESSION(S) / ED DIAGNOSES  Final diagnoses:  Facial cellulitis     ED Discharge Orders         Ordered    clindamycin (CLEOCIN) 150 MG capsule  2 times daily        03/28/20 2356    lidocaine (XYLOCAINE) 2 % solution  As needed        03/28/20 2356    albuterol (PROVENTIL) (2.5 MG/3ML) 0.083% nebulizer solution  Every 4 hours PRN        03/28/20 2356          *Please note:  Shannon Knapp was evaluated in Emergency Department on  03/29/2020 for the symptoms described in the history of present illness. He was evaluated in the context of the global COVID-19 pandemic, which necessitated consideration that the patient might be at risk for infection with the SARS-CoV-2 virus that causes COVID-19. Institutional protocols and algorithms that pertain to the evaluation of patients at risk for COVID-19 are in a state of rapid change based on information released by regulatory bodies including the CDC and federal and state organizations. These policies and algorithms were followed during the patient's care in the ED.  Some ED evaluations and interventions may be delayed as a result of limited staffing during and the pandemic.*   Note:  This document was prepared using Dragon voice recognition software and may include unintentional dictation errors.    Lucy Chris, PA 03/29/20 1646    Chesley Noon, MD 04/01/20 [phone]

## 2020-03-29 NOTE — ED Triage Notes (Addendum)
Pt presents to the [facility] via EMS from home with c/o shortness of breath. Pt reports hx of asthma and was seen at this facility earlier today for dental abscess and shortness of breath.Pt was discharged but states that symptoms worsened at home. Symptoms did not improve with breathing treatments performed at home. Auditory wheezing noted upon arrival to ED.

## 2020-03-29 NOTE — ED Notes (Signed)
Pt calm , collective, understood discharge instructions.

## 2020-03-29 NOTE — ED Provider Notes (Signed)
[facility] Emergency Department Provider Note ____________________________________________  Time seen: 1611  I have reviewed the triage vital signs and the nursing notes.  HISTORY  Chief Complaint  Dental Pain, Cough, and Shortness of Breath  HPI Shannon Knapp is a 34 y.o. male presents to the ED for evaluation of ongoing chest tightness and shortness of breath.  Patient with a history of poorly controlled asthma, chronic bronchitis, COPD, presents after evaluation in the ED yesterday.  He was treated yesterday for symptoms related to an acute dental abscess as well as some shortness of breath.   According to the chart of the patient has had some difficulty in the past coordinating care between his primary pulmonologist and difficulty in securing his bronchodilators.  He reports being on Symbicort, albuterol nebs, and albuterol MDI as rescue inhaler.  He denies any recent fever, chills, or sweats. He also denies any nausea, vomiting, or dizziness.  Patient returns today for ongoing cough and SOB.  He started the antibiotic and lidocaine solution as prescribed.  He reports improvement of his right lower jaw swelling on presentation today.  Past Medical History:  Diagnosis Date  . Asthma   . Chronic bronchitis (HCC)   . COPD (chronic obstructive pulmonary disease) [facility])     Patient Active Problem List   Diagnosis Date Noted  . 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 Community acquired pneumonia of left lower lobe of lung 10/25/2019  . Medication management 04/30/2019  . Healthcare maintenance 11/26/2018  . Severe persistent asthma 05/28/2018  . Acute febrile illness 04/10/2018  . Upper airway cough syndrome   . Hypoxemia   . Syncope   . Allergic rhinitis   . Cough   . Hemoptysis   . Protein-calorie malnutrition, severe 10/20/2017  . Acute respiratory failure with hypoxemia (HCC) 10/20/2017  . Acute respiratory distress   .  Asthma exacerbation   . Status asthmaticus 10/18/2017  . Severe persistent acute asthmatic bronchitis 10/17/2017  . Elevated blood pressure reading without diagnosis of hypertension 10/17/2017  . Respiratory distress 10/17/2017    Past Surgical History:  Procedure Laterality Date  . VIDEO BRONCHOSCOPY Bilateral 10/24/2017   Procedure: VIDEO BRONCHOSCOPY WITHOUT FLUORO;  Surgeon: Alyson Reedy, MD;  Location: [facility] ENDOSCOPY;  Service: Cardiopulmonary;  Laterality: Bilateral;    Prior to Admission medications   Medication Sig Start Date End Date Taking? Authorizing Provider  albuterol (PROVENTIL) (2.5 MG/3ML) 0.083% nebulizer solution Take 3 mLs (2.5 mg total) by nebulization every 4 (four) hours as needed for wheezing or shortness of breath. Use twice daily and as needed for coughing 01/07/20 02/06/20  Parrett, Tammy S, NP  albuterol (PROVENTIL) (2.5 MG/3ML) 0.083% nebulizer solution Take 2.5 mg by nebulization every 4 (four) hours as needed for wheezing or shortness of breath.    [provider]  albuterol (PROVENTIL) (2.5 MG/3ML) 0.083% nebulizer solution Take 3 mLs (2.5 mg total) by nebulization every 4 (four) hours as needed for wheezing or shortness of breath. 03/28/20 03/28/21  Lucy Chris, PA  albuterol (VENTOLIN HFA) 108 (90 Base) MCG/ACT inhaler Inhale 2 puffs into the lungs every 6 (six) hours as needed for shortness of breath. 01/07/20   Parrett, Virgel Bouquet, NP  Aspirin-Salicylamide-Caffeine (ARTHRITIS STRENGTH BC POWDER PO) Take 1 Package by mouth in the morning and at bedtime.    [provider]  budesonide-formoterol (SYMBICORT) 160-4.5 MCG/ACT inhaler Inhale 2 puffs into the lungs 2 (two) times daily. 02/18/20 03/19/20  Salena Saner,  MD  cetirizine (ZYRTEC) 10 MG tablet Take 10 mg by mouth daily.    [provider]  clindamycin (CLEOCIN) 150 MG capsule Take 2 capsules (300 mg total) by mouth in the morning and at bedtime for 10 days. 03/28/20 04/07/20   Lucy Chris, PA  clindamycin (CLEOCIN) 150 MG capsule Take 2 capsules (300 mg total) by mouth daily with lunch for 8 days. 03/30/20 04/07/20  Jazalynn Mireles, Charlesetta Ivory, PA-C  Fluticasone-Salmeterol,sensor, (AIRDUO DIGIHALER) [phone] MCG/ACT AEPB Inhale 1 puff into the lungs in the morning and at bedtime. 02/18/20   Glenford Bayley, NP  GUAIFENESIN 1200 PO Take 1 tablet by mouth daily.    [provider]  Itraconazole 200 MG TABS Take 1 tablet by mouth in the morning and at bedtime. 01/28/20   Salena Saner, MD  lidocaine (XYLOCAINE) 2 % solution Use as directed 15 mLs in the mouth or throat as needed for mouth pain. 03/28/20   Lucy Chris, PA  montelukast (SINGULAIR) 10 MG tablet Take 1 tablet (10 mg total) by mouth at bedtime. 11/03/19   Parrett, Virgel Bouquet, NP  omalizumab Geoffry Paradise) 150 MG/ML prefilled syringe Inject 300 mg into the skin every 14 (fourteen) days. Along with 75 mg syringe. Patient not taking: Reported on 01/07/2020 12/08/19   Yopp, Amber C, RPH-CPP  omalizumab Geoffry Paradise) 75 MG/0.5ML prefilled syringe Inject 75 mg into the skin every 14 (fourteen) days. Along with 150 mg x2 syringe. Patient not taking: Reported on 01/07/2020 12/08/19   Yopp, Amber C, RPH-CPP  omeprazole (PRILOSEC) 40 MG capsule 1 tablet daily. Do not take at same time as itraconazole Patient not taking: Reported on 02/18/2020 12/29/19   Salena Saner, MD  predniSONE (DELTASONE) 10 MG tablet Take 1 tablet (10 mg total) by mouth daily with breakfast. 01/07/20   Parrett, Virgel Bouquet, NP  predniSONE (DELTASONE) 10 MG tablet Take 50mg  po daily x 3 days; 40mg  daily x 3 days; 30mg  daily x3 days; 20mg  daily x3 days; then resume 10mg  daily 02/18/20   , NP    Allergies Amoxicillin, Doxycycline, Fish allergy, Shellfish allergy, and Penicillins  Family History  Problem Relation Age of Onset  . Hypertension Mother   . Diabetes Mother   . Asthma Father   . Hypertension Father     Social  History Social History   Tobacco Use  . Smoking status: Former Smoker    Packs/day: 0.25    Years: 14.00    Pack years: 3.50    Types: Cigarettes    Start date: 2006    Quit date: 08/2018    Years since quitting: 1.6  . Smokeless tobacco: Never Used  Vaping Use  . Vaping Use: Never used  Substance Use Topics  . Alcohol use: Not Currently    Alcohol/week: 15.0 standard drinks    Types: 15 Cans of beer per week  . Drug use: No    Review of Systems  Constitutional: Negative for fever. Eyes: Negative for visual changes. ENT: Negative for sore throat.  Dental infection as above. Cardiovascular: Negative for chest pain. Respiratory: Positive for shortness of breath. Gastrointestinal: Negative for abdominal pain, vomiting and diarrhea. Genitourinary: Negative for dysuria. Musculoskeletal: Negative for back pain. Skin: Negative for rash. Neurological: Negative for headaches, focal weakness or numbness. ____________________________________________  PHYSICAL EXAM:  VITAL SIGNS: ED Triage Vitals  Enc Vitals Group     BP 03/29/20 1451 (!) 146/96     Pulse Rate 03/29/20 1451 98  Resp 03/29/20 1451 (!) 26     Temp 03/29/20 1451 99.9 F (37.7 C)     Temp Source 03/29/20 1451 Oral     SpO2 03/29/20 1451 94 %     Weight 03/29/20 1452 172 lb (78 kg)     Height 03/29/20 1452 5\' 11"  (1.803 m)     Head Circumference --      Peak Flow --      Pain Score 03/29/20 1452 8     Pain Loc --      Pain Edu? --      Excl. in GC? --     Constitutional: Alert and oriented. Well appearing and in no distress. Head: Normocephalic and atraumatic. Eyes: Conjunctivae are normal. Normal extraocular movements Mouth/Throat: Mucous membranes are moist. Moderate swelling to the right lower jaw, consistent with confirmed dental infection.  Neck: Supple. No thyromegaly. Hematological/Lymphatic/Immunological: No cervical lymphadenopathy. Cardiovascular: Normal rate, regular rhythm. Normal distal  pulses. Respiratory: Some increased respiratory effort, with prolonged expiratory phase. Bilateral inspiratory & expiratory wheezing noted. Speaks in 4-5 word sentences. No respiratory distress noted.  Gastrointestinal: Soft and nontender. No distention. Musculoskeletal: Nontender with normal range of motion in all extremities.  Neurologic:  Normal gait without ataxia. Normal speech and language. No gross focal neurologic deficits are appreciated. Skin:  Skin is warm, dry and intact. No rash noted. Psychiatric: Mood and affect are normal. Patient exhibits appropriate insight and judgment. ____________________________________________    LABS (pertinent positives/negatives) Labs Reviewed  BASIC METABOLIC PANEL - Abnormal; Notable for the following components:      Result Value   Glucose, Bld 100 (*)    All other components within normal limits  CBC  TROPONIN I (HIGH SENSITIVITY)  ___________________________________________  EKG  ____________________________________________   RADIOLOGY CXR  Negative ____________________________________________  PROCEDURES  Clindamycin 300 mg PO DuoNeb x 3  Procedures ____________________________________________  INITIAL IMPRESSION / ASSESSMENT AND PLAN / ED COURSE  Patient with ED evaluation and subsequent visit for asthma flare and dental abscess.  Patient was seen in the ED yesterday with a work-up included a head CT that confirmed a nonorganized dental infection of the left lower jaw without focal abscess.  Patient was started on clindamycin orally.  He was also treated for his asthma flare with duo nebs given in the ED with improvement to have a discharge.  Patient was discharged to follow-up with his primary pulmonologist, whom he is scheduled to see tomorrow.  Patient's exam is overall benign reassuring this time.  He does have some moderate inspiratory and expiratory wheeze on exam.  Chest x-ray is negative for any acute cardiopulmonary  process, labs also negative reassuring at this time.  EKG is without any signs of acute coronary syndrome.  Patient will be discharged with instructions to increase his clindamycin to 3 times a day.  He also will continue with his home nebulizer treatment and asthma/COPD maintenance medicines.  He will follow-up with his pulmonologist tomorrow as planned.  ----------------------------------------- 5:54 PM on 03/29/2020 ----------------------------------------- Interim evaluation: patient with improved air movement. He is able to talk in complete sentences with much less difficulty. He has been on 3L O2 per nasal canula since evaluation due to work of breathing.   Digby Isabel CapriceM Molinari was evaluated in Emergency Department on 03/29/2020 for the symptoms described in the history of present illness. He was evaluated in the context of the global COVID-19 pandemic, which necessitated consideration that the patient might be at risk for infection with the SARS-CoV-2  virus that causes COVID-19. Institutional protocols and algorithms that pertain to the evaluation of patients at risk for COVID-19 are in a state of rapid change based on information released by regulatory bodies including the CDC and federal and state organizations. These policies and algorithms were followed during the patient's care in the ED. ____________________________________________  FINAL CLINICAL IMPRESSION(S) / ED DIAGNOSES  Final diagnoses:  Moderate persistent asthma with exacerbation  Dental abscess      Lissa Hoard, PA-C 03/29/20 1932    Shaune Pollack, MD 04/03/20 1504

## 2020-03-29 NOTE — Discharge Instructions (Addendum)
Continue to take antibiotics as prescribed.  Take your asthma medications as directed.  Follow-up with your pulmonologist tomorrow as scheduled.  Return to the ED if needed.

## 2020-03-29 NOTE — ED Triage Notes (Signed)
Pt presents to ED via POV with c/o dental pain, pt states was prescribed abx for it and "it's settled". Pt also c/o cough and SOB. Pt states was given breathing tx last night when he was seen, pt states needs his albuterol treatment switched to atrovent because it's "the only thing that opens him up". Pt ambulatory without difficulty. Pt with some noted difficulty speaking in triage, tachypneic on assessment. Pt states SOB not as bad as it was last night.   Pt also c/o feeling like the muscle around his heart is spasming.   Pt states whenever he comes to the hospital he gets atrovent and it opens him up, then when he leaves he "eats albuterol like it's candy".

## 2020-03-29 NOTE — ED Notes (Signed)
Pt called out using call bell stating he needed a syringe to be able to take his prescribed at home medication. Pt given luer-lock syringe. Pt self administered prescribed 15 ml of viscous lidocaine. Pt given emesis bag to spit out remainder of medication after swishing inside the mouth.

## 2020-03-29 NOTE — Telephone Encounter (Addendum)
Received call from patient's spouse, [name](DPR).  [name] stated that patient is experiencing increased sob. She stated that patient is overusing his inhaler (ventolin) and he is going to overdose on it. Per [name], patient contacted our [place] [facility] at 10:00 this morning and left a message with complaints of increased sob.  I do not see record of this. I have also checked the call log on the telephone, and do not see where he contacted our [place] office around 10:00a.[name] does not have name of whom patient spoke with.  Elonda Husky is upset with the situation and stated that she had to take patient back to [place] due to a doctors office not returning a call in a timely manner. Patient was seen at ED yesterday and treated for facial cellulitis and Asthma. Patient is scheduled for office visit tomorrow at 2:30 with Dr. [name].  Will route to Dr. [name] as an Lorain Childes.

## 2020-03-29 NOTE — ED Provider Notes (Signed)
[facility] Emergency Department Provider Note  ____________________________________________   First MD Initiated Contact with Patient 03/29/20 2145     (approximate)  I have reviewed the triage vital signs and the nursing notes.   HISTORY  Chief Complaint Shortness of Breath and Asthma    HPI Shannon Knapp is a 34 y.o. male with history of chronic asthma here with shortness of breath.  The patient has a fairly extensive history of asthma with recurrent admissions.  He reportedly was taken off of steroids recently for a possible dental infection.  He was seen earlier today with shortness of breath and given duo nebs.  He states he initially felt better, but at time of discharge had been feeling bad again.  He went home and had persistent and worsening shortness of breath.  He has been wheezing nonstop.  He gave himself at least 3-4 breathing treatments at home without any improvement so he presents again for evaluation.  States it feels similar to his exacerbations in the past which have required admission.  Denies any recent fevers or chills.  Denies any other acute complaints.  No sputum production.        Past Medical History:  Diagnosis Date  . Asthma   . Chronic bronchitis (HCC)   . COPD (chronic obstructive pulmonary disease) [facility])     Patient Active Problem List   Diagnosis Date Noted  . 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 Community acquired pneumonia of left lower lobe of lung 10/25/2019  . Medication management 04/30/2019  . Healthcare maintenance 11/26/2018  . Severe persistent asthma 05/28/2018  . Acute febrile illness 04/10/2018  . Upper airway cough syndrome   . Hypoxemia   . Syncope   . Allergic rhinitis   . Cough   . Hemoptysis   . Protein-calorie malnutrition, severe 10/20/2017  . Acute respiratory failure with hypoxemia (HCC) 10/20/2017  . Acute respiratory distress   . Asthma exacerbation    . Status asthmaticus 10/18/2017  . Severe persistent acute asthmatic bronchitis 10/17/2017  . Elevated blood pressure reading without diagnosis of hypertension 10/17/2017  . Respiratory distress 10/17/2017    Past Surgical History:  Procedure Laterality Date  . VIDEO BRONCHOSCOPY Bilateral 10/24/2017   Procedure: VIDEO BRONCHOSCOPY WITHOUT FLUORO;  Surgeon: Alyson Reedy, MD;  Location: [facility] ENDOSCOPY;  Service: Cardiopulmonary;  Laterality: Bilateral;    Prior to Admission medications   Medication Sig Start Date End Date Taking? Authorizing Provider  albuterol (PROVENTIL) (2.5 MG/3ML) 0.083% nebulizer solution Take 3 mLs (2.5 mg total) by nebulization every 4 (four) hours as needed for wheezing or shortness of breath. Use twice daily and as needed for coughing 01/07/20 02/06/20  Parrett, Tammy S, NP  albuterol (PROVENTIL) (2.5 MG/3ML) 0.083% nebulizer solution Take 2.5 mg by nebulization every 4 (four) hours as needed for wheezing or shortness of breath.    [provider]  albuterol (PROVENTIL) (2.5 MG/3ML) 0.083% nebulizer solution Take 3 mLs (2.5 mg total) by nebulization every 4 (four) hours as needed for wheezing or shortness of breath. 03/28/20 03/28/21  Lucy Chris, PA  albuterol (VENTOLIN HFA) 108 (90 Base) MCG/ACT inhaler Inhale 2 puffs into the lungs every 6 (six) hours as needed for shortness of breath. 01/07/20   Parrett, Virgel Bouquet, NP  Aspirin-Salicylamide-Caffeine (ARTHRITIS STRENGTH BC POWDER PO) Take 1 Package by mouth in the morning and at bedtime.    [provider]  budesonide-formoterol (SYMBICORT) 160-4.5 MCG/ACT inhaler Inhale 2 puffs  into the lungs 2 (two) times daily. 02/18/20 03/19/20  Salena Saner, MD  cetirizine (ZYRTEC) 10 MG tablet Take 10 mg by mouth daily.    [provider]  clindamycin (CLEOCIN) 150 MG capsule Take 2 capsules (300 mg total) by mouth in the morning and at bedtime for 10 days. 03/28/20 04/07/20  Lucy Chris,  PA  clindamycin (CLEOCIN) 150 MG capsule Take 2 capsules (300 mg total) by mouth daily with lunch for 8 days. 03/30/20 04/07/20  Menshew, Charlesetta Ivory, PA-C  Fluticasone-Salmeterol,sensor, (AIRDUO DIGIHALER) [phone] MCG/ACT AEPB Inhale 1 puff into the lungs in the morning and at bedtime. 02/18/20   Glenford Bayley, NP  GUAIFENESIN 1200 PO Take 1 tablet by mouth daily.    [provider]  Itraconazole 200 MG TABS Take 1 tablet by mouth in the morning and at bedtime. 01/28/20   Salena Saner, MD  lidocaine (XYLOCAINE) 2 % solution Use as directed 15 mLs in the mouth or throat as needed for mouth pain. 03/28/20   Lucy Chris, PA  montelukast (SINGULAIR) 10 MG tablet Take 1 tablet (10 mg total) by mouth at bedtime. 11/03/19   Parrett, Virgel Bouquet, NP  omalizumab Geoffry Paradise) 150 MG/ML prefilled syringe Inject 300 mg into the skin every 14 (fourteen) days. Along with 75 mg syringe. Patient not taking: Reported on 01/07/2020 12/08/19   Yopp, Amber C, RPH-CPP  omalizumab Geoffry Paradise) 75 MG/0.5ML prefilled syringe Inject 75 mg into the skin every 14 (fourteen) days. Along with 150 mg x2 syringe. Patient not taking: Reported on 01/07/2020 12/08/19   Yopp, Amber C, RPH-CPP  omeprazole (PRILOSEC) 40 MG capsule 1 tablet daily. Do not take at same time as itraconazole Patient not taking: Reported on 02/18/2020 12/29/19   Salena Saner, MD  predniSONE (DELTASONE) 10 MG tablet Take 1 tablet (10 mg total) by mouth daily with breakfast. 01/07/20   Parrett, Virgel Bouquet, NP  predniSONE (DELTASONE) 10 MG tablet Take 50mg  po daily x 3 days; 40mg  daily x 3 days; 30mg  daily x3 days; 20mg  daily x3 days; then resume 10mg  daily 02/18/20   , NP    Allergies Amoxicillin, Doxycycline, Fish allergy, Shellfish allergy, and Penicillins  Family History  Problem Relation Age of Onset  . Hypertension Mother   . Diabetes Mother   . Asthma Father   . Hypertension Father     Social History Social History    Tobacco Use  . Smoking status: Former Smoker    Packs/day: 0.25    Years: 14.00    Pack years: 3.50    Types: Cigarettes    Start date: 2006    Quit date: 08/2018    Years since quitting: 1.6  . Smokeless tobacco: Never Used  Vaping Use  . Vaping Use: Never used  Substance Use Topics  . Alcohol use: Not Currently    Alcohol/week: 15.0 standard drinks    Types: 15 Cans of beer per week  . Drug use: No    Review of Systems  Review of Systems  Constitutional: Positive for fatigue. Negative for chills and fever.  HENT: Negative for sore throat.   Respiratory: Positive for cough, shortness of breath and wheezing.   Cardiovascular: Negative for chest pain.  Gastrointestinal: Negative for abdominal pain.  Genitourinary: Negative for flank pain.  Musculoskeletal: Negative for neck pain.  Skin: Negative for rash and wound.  Allergic/Immunologic: Negative for immunocompromised state.  Neurological: Negative for weakness and numbness.  Hematological: Does  not bruise/bleed easily.  All other systems reviewed and are negative.    ____________________________________________  PHYSICAL EXAM:      VITAL SIGNS: ED Triage Vitals  Enc Vitals Group     BP 03/29/20 2147 (!) 157/114     Pulse Rate 03/29/20 2147 (!) 124     Resp 03/29/20 2147 (!) 30     Temp 03/29/20 2149 98.2 F (36.8 C)     Temp Source 03/29/20 2149 Oral     SpO2 03/29/20 2147 94 %     Weight 03/29/20 2148 165 lb (74.8 kg)     Height 03/29/20 2148  (1.803 m)     Head Circumference --      Peak Flow --      Pain Score 03/29/20 2148 0     Pain Loc --      Pain Edu? --      Excl. in GC? --      Physical Exam Vitals and nursing note reviewed.  Constitutional:      General: He is in acute distress.     Appearance: He is well-developed.  HENT:     Head: Normocephalic and atraumatic.  Eyes:     Conjunctiva/sclera: Conjunctivae normal.  Cardiovascular:     Rate and Rhythm: Normal rate and regular  rhythm.     Heart sounds: Normal heart sounds. No murmur heard.  No friction rub.  Pulmonary:     Effort: Pulmonary effort is normal. Tachypnea present. No respiratory distress.     Breath sounds: Examination of the right-upper field reveals wheezing. Examination of the left-upper field reveals wheezing. Examination of the right-middle field reveals wheezing. Examination of the left-middle field reveals wheezing. Examination of the right-lower field reveals wheezing. Examination of the left-lower field reveals wheezing. Decreased breath sounds and wheezing present. No rales.  Abdominal:     General: There is no distension.     Palpations: Abdomen is soft.     Tenderness: There is no abdominal tenderness.  Musculoskeletal:     Cervical back: Neck supple.  Skin:    General: Skin is warm.     Capillary Refill: Capillary refill takes less than 2 seconds.  Neurological:     Mental Status: He is alert and oriented to person, place, and time.     Motor: No abnormal muscle tone.       ____________________________________________   LABS (all labs ordered are listed, but only abnormal results are displayed)  Labs Reviewed - No data to display  ____________________________________________  EKG:  ________________________________________  RADIOLOGY All imaging, including plain films, CT scans, and ultrasounds, independently reviewed by me, and interpretations confirmed via formal radiology reads.  ED MD interpretation:   CXR: Reviewed, clear  Official radiology report(s): DG Chest 2 View  Result Date: 03/29/2020 CLINICAL DATA:  Shortness of breath EXAM: CHEST - 2 VIEW COMPARISON:  None. FINDINGS: The heart size and mediastinal contours are within normal limits. Both lungs are clear. The visualized skeletal structures are unremarkable. IMPRESSION: No active cardiopulmonary disease. Electronically Signed   By: Jonna Clark M.D.   On: 03/29/2020 15:44   CT Maxillofacial W  Contrast  Result Date: 03/28/2020 CLINICAL DATA:  Right lower dental pain.  Headache. EXAM: CT MAXILLOFACIAL WITH CONTRAST TECHNIQUE: Multidetector CT imaging of the maxillofacial structures was performed with intravenous contrast. Multiplanar CT image reconstructions were also generated. CONTRAST:  75mL OMNIPAQUE IOHEXOL 300 MG/ML  SOLN COMPARISON:  None. FINDINGS: Osseous: There is a large carie of  the first right mandibular molar with a small periapical lucency at its anterior root. No fracture. Orbits: Negative. No traumatic or inflammatory finding. Sinuses: Diffuse paranasal sinus disease with complete opacification of the ethmoid and frontal sinuses. Soft tissues: Moderate right facial soft tissues swelling, primarily located anterior to the right mandible. No drainable fluid collection. Limited intracranial: No significant or unexpected finding. IMPRESSION: 1. Large carie of the first right mandibular molar with a small periapical lucency at its anterior root. 2. Moderate right facial soft tissues swelling, primarily located anterior to the right mandible, consistent with odontogenic cellulitis. No drainable fluid collection. 3. Diffuse paranasal sinus disease with complete opacification of the ethmoid and frontal sinuses. Electronically Signed   By: Deatra Robinson M.D.   On: 03/28/2020 23:20    ____________________________________________  PROCEDURES   Procedure(s) performed (including Critical Care):  .Critical Care Performed by: Shaune Pollack, MD Authorized by: Shaune Pollack, MD   Critical care provider statement:    Critical care time (minutes):  35   Critical care time was exclusive of:  Separately billable procedures and treating other patients and teaching time   Critical care was necessary to treat or prevent imminent or life-threatening deterioration of the following conditions:  Circulatory failure, cardiac failure and respiratory failure   Critical care was time spent  personally by me on the following activities:  Development of treatment plan with patient or surrogate, discussions with consultants, evaluation of patient's response to treatment, examination of patient, obtaining history from patient or surrogate, ordering and performing treatments and interventions, ordering and review of laboratory studies, ordering and review of radiographic studies, pulse oximetry, re-evaluation of patient's condition and review of old charts   I assumed direction of critical care for this patient from another provider in my specialty: no      ____________________________________________  INITIAL IMPRESSION / MDM / ASSESSMENT AND PLAN / ED COURSE  As part of my medical decision making, I reviewed the following data within the electronic MEDICAL RECORD NUMBER Nursing notes reviewed and incorporated, Old chart reviewed, Notes from prior ED visits, and  Controlled Substance Database       *Ilay LOUDON KRAKOW was evaluated in Emergency Department on 03/29/2020 for the symptoms described in the history of present illness. He was evaluated in the context of the global COVID-19 pandemic, which necessitated consideration that the patient might be at risk for infection with the SARS-CoV-2 virus that causes COVID-19. Institutional protocols and algorithms that pertain to the evaluation of patients at risk for COVID-19 are in a state of rapid change based on information released by regulatory bodies including the CDC and federal and state organizations. These policies and algorithms were followed during the patient's care in the ED.  Some ED evaluations and interventions may be delayed as a result of limited staffing during the pandemic.*     Medical Decision Making: 34 year old male with second visit today for shortness of breath.  On arrival, patient markedly tachypneic, diffusely wheezing.  He is speaking in 1-2 word sentences.  Patient started on continuous nebulizer and given IV  steroids as well as IV magnesium with significant improvement.  I reviewed his chest x-ray and labs from recent visit, which showed no evidence of pneumonia.  No significant lab abnormality.  CBC without leukocytosis.  Troponin negative.  EKG is nonischemic.  BMP unremarkable.  Suspect ongoing asthma exacerbation.  Will admit for further stabilization and management.  Given his significant improvement, do not feel BiPAP is needed at this  time.  Of note, patient does have an active dental infection and will give clindamycin IV given that he will be receiving steroids.  There is no evidence of complications related to this.  He states his tooth feels better compared to his visit 2 days ago.  ____________________________________________  FINAL CLINICAL IMPRESSION(S) / ED DIAGNOSES  Final diagnoses:  Moderate persistent asthma with exacerbation     MEDICATIONS GIVEN DURING THIS VISIT:  Medications  clindamycin (CLEOCIN) IVPB 600 mg (has no administration in time range)  methylPREDNISolone sodium succinate (SOLU-MEDROL) 125 mg/2 mL injection 125 mg (125 mg Intravenous Given 03/29/20 2158)  albuterol (PROVENTIL,VENTOLIN) solution continuous neb (15 mg/hr Nebulization Given 03/29/20 2232)  magnesium sulfate IVPB 2 g 50 mL (0 g Intravenous Stopped 03/29/20 2210)  sodium chloride 0.9 % bolus 1,000 mL (1,000 mLs Intravenous New Bag/Given 03/29/20 2157)  ipratropium-albuterol (DUONEB) 0.5-2.5 (3) MG/3ML nebulizer solution (3 mLs  Given 03/29/20 2153)     ED Discharge Orders    None       Note:  This document was prepared using Dragon voice recognition software and may include unintentional dictation errors.   Shaune PollackIsaacs, Maiya Kates, MD 03/29/20 2246

## 2020-03-29 NOTE — ED Notes (Signed)
Respiratory at bedside for continuous Neb

## 2020-03-29 NOTE — Telephone Encounter (Signed)
Reviewing the ED records it appears that he has cyst/cellulitis and this may have triggered issues.  If he uses albuterol very frequently he will get what is called tachyphylaxis which means that he will not respond to he has use it excessively.  Very challenging to take care of as he does not keep scheduled appointments and is always being seen in crisis.  I concur with the account of the events as below.

## 2020-03-30 ENCOUNTER — Encounter: Payer: Self-pay | Admitting: Internal Medicine

## 2020-03-30 ENCOUNTER — Ambulatory Visit: Payer: Medicaid Other | Admitting: Pulmonary Disease

## 2020-03-30 DIAGNOSIS — J4551 Severe persistent asthma with (acute) exacerbation: Principal | ICD-10-CM

## 2020-03-30 DIAGNOSIS — B4481 Allergic bronchopulmonary aspergillosis: Secondary | ICD-10-CM

## 2020-03-30 DIAGNOSIS — M272 Inflammatory conditions of jaws: Secondary | ICD-10-CM

## 2020-03-30 DIAGNOSIS — J322 Chronic ethmoidal sinusitis: Secondary | ICD-10-CM

## 2020-03-30 LAB — CBC WITH DIFFERENTIAL/PLATELET
Abs Immature Granulocytes: 0.07 10*3/uL (ref 0.00–0.07)
Basophils Absolute: 0 10*3/uL (ref 0.0–0.1)
Basophils Relative: 0 %
Eosinophils Absolute: 0 10*3/uL (ref 0.0–0.5)
Eosinophils Relative: 0 %
HCT: 42.7 % (ref 39.0–52.0)
Hemoglobin: 14.7 g/dL (ref 13.0–17.0)
Immature Granulocytes: 1 %
Lymphocytes Relative: 3 %
Lymphs Abs: 0.3 10*3/uL — ABNORMAL LOW (ref 0.7–4.0)
MCH: 32 pg (ref 26.0–34.0)
MCHC: 34.4 g/dL (ref 30.0–36.0)
MCV: 92.8 fL (ref 80.0–100.0)
Monocytes Absolute: 0.1 10*3/uL (ref 0.1–1.0)
Monocytes Relative: 1 %
Neutro Abs: 9.5 10*3/uL — ABNORMAL HIGH (ref 1.7–7.7)
Neutrophils Relative %: 95 %
Platelets: 270 10*3/uL (ref 150–400)
RBC: 4.6 MIL/uL (ref 4.22–5.81)
RDW: 12.4 % (ref 11.5–15.5)
WBC: 10.1 10*3/uL (ref 4.0–10.5)
nRBC: 0 % (ref 0.0–0.2)

## 2020-03-30 LAB — URINALYSIS, ROUTINE W REFLEX MICROSCOPIC
Bacteria, UA: NONE SEEN
Bilirubin Urine: NEGATIVE
Glucose, UA: NEGATIVE mg/dL
Hgb urine dipstick: NEGATIVE
Ketones, ur: 20 mg/dL — AB
Leukocytes,Ua: NEGATIVE
Nitrite: NEGATIVE
Protein, ur: 30 mg/dL — AB
Specific Gravity, Urine: 1.027 (ref 1.005–1.030)
pH: 5 (ref 5.0–8.0)

## 2020-03-30 LAB — EXPECTORATED SPUTUM ASSESSMENT W GRAM STAIN, RFLX TO RESP C

## 2020-03-30 LAB — RESPIRATORY PANEL BY PCR

## 2020-03-30 LAB — COMPREHENSIVE METABOLIC PANEL
ALT: 25 U/L (ref 0–44)
AST: 27 U/L (ref 15–41)
Albumin: 3.8 g/dL (ref 3.5–5.0)
Alkaline Phosphatase: 49 U/L (ref 38–126)
Anion gap: 10 (ref 5–15)
BUN: 8 mg/dL (ref 6–20)
CO2: 24 mmol/L (ref 22–32)
Calcium: 8.6 mg/dL — ABNORMAL LOW (ref 8.9–10.3)
Chloride: 101 mmol/L (ref 98–111)
Creatinine, Ser: 0.7 mg/dL (ref 0.61–1.24)
GFR, Estimated: >60 mL/min (ref >60)
Glucose, Bld: 172 mg/dL — ABNORMAL HIGH (ref 70–99)
Potassium: 4.2 mmol/L (ref 3.5–5.1)
Sodium: 135 mmol/L (ref 135–145)
Total Bilirubin: 1 mg/dL (ref 0.3–1.2)
Total Protein: 7.1 g/dL (ref 6.5–8.1)

## 2020-03-30 LAB — PHOSPHORUS: Phosphorus: 2.7 mg/dL (ref 2.5–4.6)

## 2020-03-30 LAB — RESPIRATORY PANEL BY RT PCR (FLU A&B, COVID)
Influenza A by PCR: NEGATIVE
Influenza B by PCR: NEGATIVE
SARS Coronavirus 2 by RT PCR: NEGATIVE

## 2020-03-30 LAB — HIV ANTIBODY (ROUTINE TESTING W REFLEX): HIV Screen 4th Generation wRfx: NONREACTIVE

## 2020-03-30 LAB — TSH: TSH: 0.204 u[IU]/mL — ABNORMAL LOW (ref 0.350–4.500)

## 2020-03-30 LAB — T4, FREE: Free T4: 0.86 ng/dL (ref 0.61–1.12)

## 2020-03-30 LAB — MAGNESIUM: Magnesium: 2.1 mg/dL (ref 1.7–2.4)

## 2020-03-30 MED ORDER — ENOXAPARIN SODIUM 40 MG/0.4ML ~~LOC~~ SOLN
40.0000 mg | SUBCUTANEOUS | Status: DC
  Administered 2020-03-30 – 2020-04-02 (×4): 40 mg via SUBCUTANEOUS
  Filled 2020-03-30 (×5): qty 0.4

## 2020-03-30 MED ORDER — IPRATROPIUM-ALBUTEROL 0.5-2.5 (3) MG/3ML IN SOLN
3.0000 mL | RESPIRATORY_TRACT | Status: DC | PRN
  Administered 2020-03-31 – 2020-04-03 (×10): 3 mL via RESPIRATORY_TRACT
  Filled 2020-03-30 (×10): qty 3

## 2020-03-30 MED ORDER — IPRATROPIUM-ALBUTEROL 0.5-2.5 (3) MG/3ML IN SOLN
3.0000 mL | RESPIRATORY_TRACT | Status: DC | PRN
  Administered 2020-03-30: 3 mL via RESPIRATORY_TRACT
  Filled 2020-03-30: qty 3

## 2020-03-30 MED ORDER — BUDESONIDE 0.5 MG/2ML IN SUSP
2.0000 mg | Freq: Four times a day (QID) | RESPIRATORY_TRACT | Status: DC
  Administered 2020-03-30 (×2): 2 mg via RESPIRATORY_TRACT
  Administered 2020-03-30 (×2): 0.5 mg via RESPIRATORY_TRACT
  Filled 2020-03-30 (×3): qty 8

## 2020-03-30 MED ORDER — LIDOCAINE VISCOUS HCL 2 % MT SOLN
15.0000 mL | OROMUCOSAL | Status: DC | PRN
  Filled 2020-03-30: qty 15

## 2020-03-30 MED ORDER — DM-GUAIFENESIN ER 30-600 MG PO TB12
1.0000 | ORAL_TABLET | Freq: Two times a day (BID) | ORAL | Status: DC
  Administered 2020-03-30 – 2020-04-02 (×8): 1 via ORAL
  Filled 2020-03-30 (×8): qty 1

## 2020-03-30 MED ORDER — HYDROCODONE-ACETAMINOPHEN 5-325 MG PO TABS: 1.0000 | ORAL_TABLET | ORAL | Status: DC | PRN

## 2020-03-30 MED ORDER — SODIUM CHLORIDE 0.9 % IV SOLN
75.0000 mL/h | INTRAVENOUS | Status: AC
  Administered 2020-03-30: 75 mL/h via INTRAVENOUS

## 2020-03-30 MED ORDER — IPRATROPIUM-ALBUTEROL 0.5-2.5 (3) MG/3ML IN SOLN
3.0000 mL | Freq: Four times a day (QID) | RESPIRATORY_TRACT | Status: DC
  Administered 2020-03-30: 3 mL via RESPIRATORY_TRACT
  Filled 2020-03-30 (×2): qty 3

## 2020-03-30 MED ORDER — MONTELUKAST SODIUM 10 MG PO TABS
10.0000 mg | ORAL_TABLET | Freq: Every day | ORAL | Status: DC
  Administered 2020-03-30: 10 mg via ORAL
  Filled 2020-03-30: qty 1

## 2020-03-30 MED ORDER — MOMETASONE FURO-FORMOTEROL FUM 200-5 MCG/ACT IN AERO
1.0000 | INHALATION_SPRAY | Freq: Two times a day (BID) | RESPIRATORY_TRACT | Status: DC
  Administered 2020-03-30: 1 via RESPIRATORY_TRACT
  Filled 2020-03-30: qty 8.8

## 2020-03-30 MED ORDER — LORATADINE 10 MG PO TABS
10.0000 mg | ORAL_TABLET | Freq: Every day | ORAL | Status: DC
  Administered 2020-03-30 – 2020-04-02 (×4): 10 mg via ORAL
  Filled 2020-03-30 (×5): qty 1

## 2020-03-30 MED ORDER — ACETAMINOPHEN 650 MG RE SUPP: 650.0000 mg | Freq: Four times a day (QID) | RECTAL | Status: DC | PRN

## 2020-03-30 MED ORDER — SODIUM CHLORIDE 0.9% FLUSH
3.0000 mL | Freq: Two times a day (BID) | INTRAVENOUS | Status: DC
  Administered 2020-03-30 – 2020-04-02 (×9): 3 mL via INTRAVENOUS

## 2020-03-30 MED ORDER — ALBUTEROL SULFATE (2.5 MG/3ML) 0.083% IN NEBU: 2.5000 mg | INHALATION_SOLUTION | RESPIRATORY_TRACT | Status: DC | PRN

## 2020-03-30 MED ORDER — LEVALBUTEROL HCL 0.63 MG/3ML IN NEBU
0.6300 mg | INHALATION_SOLUTION | Freq: Four times a day (QID) | RESPIRATORY_TRACT | Status: DC
  Administered 2020-03-30: 0.63 mg via RESPIRATORY_TRACT
  Filled 2020-03-30: qty 3

## 2020-03-30 MED ORDER — BUDESONIDE 0.5 MG/2ML IN SUSP
0.5000 mg | Freq: Two times a day (BID) | RESPIRATORY_TRACT | Status: DC
  Administered 2020-03-30 – 2020-04-03 (×8): 0.5 mg via RESPIRATORY_TRACT
  Filled 2020-03-30 (×8): qty 2

## 2020-03-30 MED ORDER — ACETAMINOPHEN 325 MG PO TABS: 650.0000 mg | ORAL_TABLET | Freq: Four times a day (QID) | ORAL | Status: DC | PRN

## 2020-03-30 MED ORDER — METHYLPREDNISOLONE SODIUM SUCC 40 MG IJ SOLR
40.0000 mg | Freq: Four times a day (QID) | INTRAMUSCULAR | Status: AC
  Administered 2020-03-30 – 2020-03-31 (×4): 40 mg via INTRAVENOUS
  Filled 2020-03-30 (×4): qty 1

## 2020-03-30 MED ORDER — LEVALBUTEROL HCL 0.63 MG/3ML IN NEBU: 0.6300 mg | INHALATION_SOLUTION | RESPIRATORY_TRACT | Status: DC | PRN

## 2020-03-30 MED ORDER — PREDNISONE 20 MG PO TABS
40.0000 mg | ORAL_TABLET | Freq: Every day | ORAL | Status: DC
  Administered 2020-03-31 – 2020-04-02 (×3): 40 mg via ORAL
  Filled 2020-03-30 (×4): qty 2

## 2020-03-30 MED ORDER — ARFORMOTEROL TARTRATE 15 MCG/2ML IN NEBU
15.0000 ug | INHALATION_SOLUTION | Freq: Two times a day (BID) | RESPIRATORY_TRACT | Status: DC
  Administered 2020-03-30 – 2020-04-03 (×8): 15 ug via RESPIRATORY_TRACT
  Filled 2020-03-30 (×9): qty 2

## 2020-03-30 NOTE — Telephone Encounter (Signed)
Patient is currently admitted. Appointment for 03/30/20 at 2:30 has been canceled.  I have spoken to patient's spouse and advised her to have patient contact our office to schedule f/u once discharged. Nothing further needed.

## 2020-03-30 NOTE — ED Notes (Signed)
This RN to bedside at this time. Pt visualized in bed with eyes closed, resting comfortably. Pt NAD at this time. RR 18, O2 98% RA at this time.   Lights dimmed for pt comfort at this time.

## 2020-03-30 NOTE — Plan of Care (Signed)
  Problem: Education: Goal: Knowledge of General Education information will improve Description: Including pain rating scale, medication(s)/side effects and non-pharmacologic comfort measures Outcome: Progressing   Problem: Clinical Measurements: Goal: Ability to maintain clinical measurements within normal limits will improve Outcome: Progressing   Problem: Clinical Measurements: Goal: Will remain free from infection Outcome: Progressing   Problem: Clinical Measurements: Goal: Diagnostic test results will improve Outcome: Progressing   Problem: Clinical Measurements: Goal: Respiratory complications will improve Outcome: Progressing   Problem: Pain Managment: Goal: General experience of comfort will improve Outcome: Progressing   Problem: Safety: Goal: Ability to remain free from injury will improve Outcome: Progressing

## 2020-03-30 NOTE — ED Notes (Signed)
Pt transported to 2A-256 by this Rn at this time.

## 2020-03-30 NOTE — Consult Note (Signed)
Reason for Consult: Follow-up on acute exacerbation of severe persistent asthma Referring Physician: Dr. Myrene GalasEzenduka  Shannon Knapp is an 34 y.o. male.  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  [facility] [place] [facility] but transferred his care to [place] office for proximity.  He was admitted yesterday after shortness of breath.  He had developed issues with dental pain due to tooth and gum infection.  When he was first diagnosed with this his prednisone was discontinued.  Because of ABPA he has chronic need for steroids.  He was subsequently admitted because of persistent shortness of breath and acute asthma exacerbation.  On our evaluation of him today he appears to be better controlled but still requiring supplemental oxygen.  He continues to have some dyspnea but notes that he feels markedly better.  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  He was noted to have severe eosinophilia, elevated IgE and Aspergillus IgE panel to Aspergillus fumigatus and [place].  He underwent 16 weeks of itraconazole therapy and is maintained on 10 mg of prednisone daily.  He has been approved for Xolair and was to start this medication however,could not afford his EpiPen.  He has been referred to the medication assistance program.  He has difficulty adhering to maintenance medications due to inability to pay for them.  [facility] had been ineffective.  Also recently has been on Symbicort which has been effective however he tends to not be able to get the medication once it runs out.  Most recently he had been increasing his use of albuterol which may have produced an effect of tachyphylaxis.  He has not been able to remain employed due to his frequent exacerbations.  It is a hope that once he gets on regular Xolair injections that  this will help control his asthma greatly.  As noted, since he was admitted he feels markedly better he feels that antibiotics are helping his facial cellulitis issues.  His breathing has improved.  He has not had any fevers, chills or sweats.  He has had worsening of gastroesophageal reflux symptoms.  Significant purulent sinus drainage.    Past Medical History:  Diagnosis Date  . Allergic bronchopulmonary aspergillosis (HCC)   . Asthma   . Chronic bronchitis (HCC)     Past Surgical History:  Procedure Laterality Date  . VIDEO BRONCHOSCOPY Bilateral 10/24/2017   Procedure: VIDEO BRONCHOSCOPY WITHOUT FLUORO;  Surgeon: Alyson ReedyYacoub, Wesam G, MD;  [facility];  Service: Cardiopulmonary;  Laterality: Bilateral;    Family History  Problem Relation Age of Onset  . Hypertension Mother   . Diabetes Mother   . Asthma Father   . Hypertension Father     Social History   Tobacco Use  . Smoking status: Former Smoker    Packs/day: 0.25    Years: 14.00    Pack years: 3.50    Types: Cigarettes    Start date: 2006    Quit date: 08/2018    Years since quitting: 1.6  . Smokeless tobacco: Never Used  Substance Use Topics  . Alcohol use: Not Currently    Alcohol/week: 15.0 standard drinks    Types: 15 Cans of beer per week     Allergies:  Allergies  Allergen Reactions  . Amoxicillin Hives    Hives all over the body. [facility]  [place]. Has patient had a PCN reaction causing immediate rash, facial/tongue/throat swelling, SOB or lightheadedness with hypotension: Yes Has patient had a PCN reaction causing severe rash involving mucus membranes or skin necrosis: No Has patient had a PCN reaction that required hospitalization: No Has patient had a PCN reaction occurring within the last 10 years: Yes If all of the above answers are "NO", then may proceed with Cephalosporin use.  Marland Kitchen Doxycycline   . Fish Allergy Swelling  . Shellfish Allergy Swelling  . Penicillins Hives    Has  patient had a PCN reaction causing immediate rash, facial/tongue/throat swelling, SOB or lightheadedness with hypotension: Yes Has patient had a PCN reaction causing severe rash involving mucus membranes or skin necrosis: No Has patient had a PCN reaction that required hospitalization: No Has patient had a PCN reaction occurring within the last 10 years: Yes If all of the above answers are "NO", then may proceed with Cephalosporin use.    Medications:  Scheduled Meds: . budesonide (PULMICORT) nebulizer solution  2 mg Nebulization Q6H  . dextromethorphan-guaiFENesin  1 tablet Oral BID  . enoxaparin (LOVENOX) injection  40 mg Subcutaneous Q24H  . levalbuterol  0.63 mg Nebulization Q6H  . loratadine  10 mg Oral Daily  . methylPREDNISolone (SOLU-MEDROL) injection  40 mg Intravenous Q6H   Followed by  . [START ON 03/31/2020] predniSONE  40 mg Oral Q breakfast  . mometasone-formoterol  1 puff Inhalation BID  . montelukast  10 mg Oral QHS  . sodium chloride flush  3 mL Intravenous Q12H   Continuous Infusions: . azithromycin Stopped (03/30/20 0856)  . clindamycin (CLEOCIN) IV 600 mg (03/30/20 1244)   PRN Meds:.acetaminophen **OR** acetaminophen, HYDROcodone-acetaminophen, ipratropium-albuterol, lidocaine   Results for orders placed or performed during the hospital encounter of 03/29/20 (from the past 48 hour(s))  Respiratory Panel by RT PCR (Flu A&B, Covid) - Nasopharyngeal Swab     Status: None   Collection Time: 03/29/20 11:22 PM   Specimen: Nasopharyngeal Swab  Result Value Ref Range   SARS Coronavirus 2 by RT PCR NEGATIVE NEGATIVE    Comment: (NOTE) SARS-CoV-2 target nucleic acids are NOT DETECTED.  The SARS-CoV-2 RNA is generally detectable in upper respiratoy specimens during the acute phase of infection. The lowest concentration of SARS-CoV-2 viral copies this assay can detect is 131 copies/mL. A negative result does not preclude SARS-Cov-2 infection and should not be used as  the sole basis for treatment or other patient management decisions. A negative result may occur with  improper specimen collection/handling, submission of specimen other than nasopharyngeal swab, presence of viral mutation(s) within the areas targeted by this assay, and inadequate number of viral copies (<131 copies/mL). A negative result must be combined with clinical observations, patient history, and epidemiological information. The expected result is Negative.  Fact Sheet for Patients:  https://www.moore.com/  Fact Sheet for Healthcare Providers:  https://www.young.biz/  This test is no t yet approved or cleared by the [place] FDA and  has been authorized for detection and/or diagnosis of SARS-CoV-2 by FDA under an Emergency Use Authorization (EUA). This EUA will remain  in effect (meaning this test can be used) for the duration of the COVID-19 declaration under Section 564(b)(1) of the Act, 21 U.S.C. section 360bbb-3(b)(1), unless the authorization is terminated or revoked sooner.     Influenza A by PCR NEGATIVE NEGATIVE   Influenza B by PCR NEGATIVE NEGATIVE    Comment: (NOTE) The Xpert Xpress SARS-CoV-2/FLU/RSV assay is intended as an aid  in  the diagnosis of influenza from Nasopharyngeal swab specimens and  should not be used as a sole basis for treatment. Nasal washings and  aspirates are unacceptable for Xpert Xpress SARS-CoV-2/FLU/RSV  testing.  Fact Sheet for Patients: https://www.moore.com/  Fact Sheet for Healthcare Providers: https://www.young.biz/  This test is not yet approved or cleared by the [place] FDA and  has been authorized for detection and/or diagnosis of SARS-CoV-2 by  FDA under an Emergency Use Authorization (EUA). This EUA will remain  in effect (meaning this test can be used) for the duration of the  Covid-19 declaration under Section 564(b)(1) of the Act,  21  U.S.C. section 360bbb-3(b)(1), unless the authorization is  terminated or revoked. Performed at [facility], [address] [place], [place] [address]   Blood gas, venous     Status: Abnormal (Preliminary result)   Collection Time: 03/29/20 11:35 PM  Result Value Ref Range   FIO2 PENDING    pH, Ven 7.30 7.25 - 7.43   pCO2, Ven 46 44 - 60 mmHg   pO2, Ven 46.0 (H) 32 - 45 mmHg   Bicarbonate 22.6 20.0 - 28.0 mmol/L   Acid-base deficit 4.0 (H) 0.0 - 2.0 mmol/L   O2 Saturation 76.5 %   Patient temperature 37.0    Collection site VEIN    Drawn by VEIN    Sample type VEIN     Comment: Performed at [facility], [address] [place], [place] [address]  Magnesium     Status: None   Collection Time: 03/30/20  3:41 AM  Result Value Ref Range   Magnesium 2.1 1.7 - 2.4 mg/dL    Comment: Performed at [facility], [address] [place], [place] [address]  Phosphorus     Status: None   Collection Time: 03/30/20  3:41 AM  Result Value Ref Range   Phosphorus 2.7 2.5 - 4.6 mg/dL    Comment: Performed at [facility], [address] [place], [place] [address]  CBC WITH DIFFERENTIAL     Status: Abnormal   Collection Time: 03/30/20  3:41 AM  Result Value Ref Range   WBC 10.1 4.0 - 10.5 K/uL   RBC 4.60 4.22 - 5.81 MIL/uL   Hemoglobin 14.7 13.0 - 17.0 g/dL   HCT 65.7 39 - 52 %   MCV 92.8 80.0 - 100.0 fL   MCH 32.0 26.0 - 34.0 pg   MCHC 34.4 30.0 - 36.0 g/dL   RDW 84.6 96.2 - 95.2 %   Platelets 270 150 - 400 K/uL   nRBC 0.0 0.0 - 0.2 %   Neutrophils Relative % 95 %   Neutro Abs 9.5 (H) 1.7 - 7.7 K/uL   Lymphocytes Relative 3 %   Lymphs Abs 0.3 (L) 0.7 - 4.0 K/uL   Monocytes Relative 1 %   Monocytes Absolute 0.1 0.1 - 1.0 K/uL   Eosinophils Relative 0 %   Eosinophils Absolute 0.0 0.0 - 0.5 K/uL   Basophils Relative 0 %   Basophils Absolute 0.0 0.0 - 0.1 K/uL   Immature Granulocytes 1 %   Abs Immature Granulocytes 0.07 0.00 - 0.07 K/uL      Comment: Performed at [facility], [address] [place], [place] [address]  TSH     Status: Abnormal   Collection Time: 03/30/20  3:41 AM  Result Value Ref Range   TSH 0.204 (L) 0.350 - 4.500 uIU/mL    Comment: Performed by a 3rd Generation  assay with a functional sensitivity of <=0.01 uIU/mL. Performed at [facility], [address] [place], [place] [address]   Comprehensive metabolic panel     Status: Abnormal   Collection Time: 03/30/20  3:41 AM  Result Value Ref Range   Sodium 135 135 - 145 mmol/L   Potassium 4.2 3.5 - 5.1 mmol/L   Chloride 101 98 - 111 mmol/L   CO2 24 22 - 32 mmol/L   Glucose, Bld 172 (H) 70 - 99 mg/dL    Comment: Glucose reference range applies only to samples taken after fasting for at least 8 hours.   BUN 8 6 - 20 mg/dL   Creatinine, Ser 7.67 0.61 - 1.24 mg/dL   Calcium 8.6 (L) 8.9 - 10.3 mg/dL   Total Protein 7.1 6.5 - 8.1 g/dL   Albumin 3.8 3.5 - 5.0 g/dL   AST 27 15 - 41 U/L   ALT 25 0 - 44 U/L   Alkaline Phosphatase 49 38 - 126 U/L   Total Bilirubin 1.0 0.3 - 1.2 mg/dL   GFR, Estimated >20 >94 mL/min    Comment: (NOTE) Calculated using the CKD-EPI Creatinine Equation (2021)    Anion gap 10 5 - 15    Comment: Performed at [facility], [address] [place], [place] [address]  Urinalysis, Routine w reflex microscopic     Status: Abnormal   Collection Time: 03/30/20  4:14 AM  Result Value Ref Range   Color, Urine YELLOW (A) YELLOW   APPearance HAZY (A) CLEAR   Specific Gravity, Urine 1.027 1.005 - 1.030   pH 5.0 5.0 - 8.0   Glucose, UA NEGATIVE NEGATIVE mg/dL   Hgb urine dipstick NEGATIVE NEGATIVE   Bilirubin Urine NEGATIVE NEGATIVE   Ketones, ur 20 (A) NEGATIVE mg/dL   Protein, ur 30 (A) NEGATIVE mg/dL   Nitrite NEGATIVE NEGATIVE   Leukocytes,Ua NEGATIVE NEGATIVE   WBC, UA 0-5 0 - 5 WBC/hpf   Bacteria, UA NONE SEEN NONE SEEN   Squamous Epithelial / LPF 0-5 0 - 5   Mucus PRESENT    Hyaline Casts, UA  PRESENT     Comment: Performed at [facility], [address] [place], [place] [address]  Culture, sputum-assessment     Status: None   Collection Time: 03/30/20  4:14 AM   Specimen: Expectorated Sputum  Result Value Ref Range   Specimen Description EXPECTORATED SPUTUM    Special Requests NONE    Sputum evaluation      Sputum specimen not acceptable for testing.  Please recollect.   RESULT CALLED TO, READ BACK BY AND VERIFIED WITH: Alger Simons RN [phone] 03/30/20 HNM Performed at [facility] [place] [facility], [address] [place], [place] [address]    Report Status 03/30/2020 FINAL     DG Chest 2 View  Result Date: 03/29/2020 CLINICAL DATA:  Shortness of breath EXAM: CHEST - 2 VIEW COMPARISON:  None. FINDINGS: The heart size and mediastinal contours are within normal limits. Both lungs are clear. The visualized skeletal structures are unremarkable. IMPRESSION: No active cardiopulmonary disease. Electronically Signed   By: Jonna Clark M.D.   On: 03/29/2020 15:44   DG Chest 2 View  Result Date: 03/28/2020 CLINICAL DATA:  Shortness of breath EXAM: CHEST - 2 VIEW COMPARISON:  12/21/2019 FINDINGS: The heart size and mediastinal contours are within normal limits. Both lungs are clear. The visualized skeletal structures are unremarkable. IMPRESSION: No active cardiopulmonary disease. Electronically Signed   By: Deatra Robinson  M.D.   On: 03/28/2020 19:36   CT Maxillofacial W Contrast  Result Date: 03/28/2020 CLINICAL DATA:  Right lower dental pain.  Headache. EXAM: CT MAXILLOFACIAL WITH CONTRAST TECHNIQUE: Multidetector CT imaging of the maxillofacial structures was performed with intravenous contrast. Multiplanar CT image reconstructions were also generated. CONTRAST:  80mL OMNIPAQUE IOHEXOL 300 MG/ML  SOLN COMPARISON:  None. FINDINGS: Osseous: There is a large carie of the first right mandibular molar with a small periapical lucency at its anterior root. No fracture. Orbits: Negative. No  traumatic or inflammatory finding. Sinuses: Diffuse paranasal sinus disease with complete opacification of the ethmoid and frontal sinuses. Soft tissues: Moderate right facial soft tissues swelling, primarily located anterior to the right mandible. No drainable fluid collection. Limited intracranial: No significant or unexpected finding. IMPRESSION: 1. Large carie of the first right mandibular molar with a small periapical lucency at its anterior root. 2. Moderate right facial soft tissues swelling, primarily located anterior to the right mandible, consistent with odontogenic cellulitis. No drainable fluid collection. 3. Diffuse paranasal sinus disease with complete opacification of the ethmoid and frontal sinuses. Electronically Signed   By: Deatra Robinson M.D.   On: 03/28/2020 23:20    Review of Systems  A 10 point review of systems was performed and it is as noted above otherwise negative.  Blood pressure 119/73, pulse (!) 107, temperature 97.8 F (36.6 C), temperature source Oral, resp. rate 19, height 5\' 11"  (1.803 m), weight 74.8 kg, SpO2 100 %. Physical Exam GENERAL: Well-developed, well-nourished gentleman in no acute distress.  Poor dentition.   HEAD: Normocephalic, atraumatic.  There is mild right facial swelling. EYES: Pupils equal, round, reactive to light.  No scleral icterus.  MOUTH: Poor dentition, caries on right. NECK: Supple. No thyromegaly. Trachea midline. No JVD.  No adenopathy. PULMONARY: Symmetrical air entry, bilateral end expiratory wheezes, few rhonchi. CARDIOVASCULAR: S1 and S2.  Mildly tachycardic rate with regular rhythm.  No rubs, murmurs or gallops heard. ABDOMEN: Benign. MUSCULOSKELETAL: No joint deformity, no clubbing, no edema.  NEUROLOGIC: No overt focal deficit. SKIN: Intact,warm,dry. PSYCH:  Chest x-ray shows hyperinflation:   Maxillofacial CT shows extensive ethmoid sinusitis     Assessment/Plan:  Acute exacerbation of severe persistent  asthma Triggers: Dental/facial infection and ethmoid sinusitis History of ABPA Noncompliance with medical regimen due to finances Have made adjustments to his medication regimen while in-house Please see orders for details Note that previously [facility] was not effective for this patient Recommend case manager consultation Upon discharge: Symbicort 160/4.5, 2 inhalations twice a day and Spiriva 1.25 mcg 2 puffs daily Procure EpiPen so he can start Xolair injections Xolair is indicated for the management of severe persistent asthma with elevated IgE and ABPA Taper prednisone to no lower than 10 mg daily Hopefully, Xolair therapy will help in weaning patient off of steroids altogether  Ethmoid sinusitis Nasal hygiene May need ENT evaluation in the future Continue antibiotics  Dental caries/abscess Will need evaluation by dentist as outpatient Continue antibiotics  We will continue to follow the patient along with you.   [facility], MD [facility] 03/30/2020, 10:12 AM   *This note was dictated using voice recognition software/Dragon.  Despite best efforts to proofread, errors can occur which can change the meaning.  Any change was purely unintentional.

## 2020-03-30 NOTE — Progress Notes (Addendum)
PROGRESS NOTE  Shannon BennettRayshawn M Knapp ZOX:[id number] DOB: 12/28/1985 DOA: 03/29/2020 PCP: Patient, No Pcp Per  HPI/Recap of past 24 hours: Shannon Knapp is a 34 y.o. male with medical history significant of chronic asthma steroid dependent/ COPD, presented with worsening SOB, cough and wheezing for the past 2 weeks. Pt has been trying to use his inhalers but hasnt seem to help. Patient's pulmonologist felt that he was overusing his inhaler and developing  tachyphylaxis. Pt was seen early in ED and was able to be discharged home, returned to the ED due to worsening symptoms. No fevers/chills/nausea/vomiting/diarrhea. Of note, patient is chronically steroid-dependent. Recently developed dental pain felt to be secondary to tooth infection. Pt was recently started on clindamycin and his prednisone was stopped due to possible infection. In the ED, patient noted to be tachycardic, tachypneic, afebrile, labs and chest x-ray unremarkable, COVID-19 and flu negative. CT maxillofacial showed large carie of the first right mandibular molar, also noted moderate right facial soft tissue swelling located anterior to the right mandible, consistent with odontogenic cellulitis, diffuse paranasal sinus disease with complete opacification of the ethmoid and frontal sinuses. Admitted for further management. Patient is not vaccinated for Covid and would like to receive it prior to discharge    Today, patient continues to be significantly short of breath, with bilateral wheezing noted. Still noted to be congested, with nonproductive cough. Denies any chest pain, but does report some tightness while coughing, denies any abdominal pain, nausea/vomiting, fever/chills.    Assessment/Plan: Active Problems:   Asthma exacerbation   Severe persistent asthma   Odontogenic infection of jaw   Acute exacerbation of severe persistent asthma Currently saturating well on 2 to 3 L of O2, plan to wean off Still noted to be  tachycardic, tachypneic Currently afebrile, with no leukocytosis COVID-19, flu negative Respiratory panel PCR pending VBG no evidence of hypercapnia Chest x-ray unremarkable Continue steroid taper, DuoNeb, Pulmicort, Mucinex DM Continue azithromycin Supplemental oxygen as needed Pulmonary consulted Patient is not vaccinated for Covid and would like to receive it prior to discharge  Odontogenic cellulitis Currently afebrile, with no leukocytosis CT maxillofacial showed large carie of the first right mandibular molar, also noted moderate right facial soft tissue swelling located anterior to the right mandible, consistent with odontogenic cellulitis, diffuse paranasal sinus disease with complete opacification of the ethmoid and frontal sinuses Continue clindamycin Outpatient follow-up with dentist  Low TSH Free T4 pending        Malnutrition Type:      Malnutrition Characteristics:      Nutrition Interventions:       Estimated body mass index is 23.01 kg/m as calculated from the following:   Height as of this encounter: 5\' 11"  (1.803 m).   Weight as of this encounter: 74.8 kg.     Code Status: Full  Family Communication: Discussed with fianc over the phone on 03/30/2020  Disposition Plan: Status is: Inpatient  Remains inpatient appropriate because:Inpatient level of care appropriate due to severity of illness   Dispo: The patient is from: Home              Anticipated d/c is to: Home              Anticipated d/c date is: 3 days              Patient currently is not medically stable to d/c.    Consultants:  Pulmonary  Procedures:  None  Antimicrobials:  Clindamycin  DVT prophylaxis: Lovenox  Objective: Vitals:   03/30/20 0828 03/30/20 0830 03/30/20 0908 03/30/20 1010  BP:  129/75 119/73   Pulse:  (!) 119 (!) 107   Resp:  17 19   Temp:   97.8 F (36.6 C)   TempSrc:   Oral   SpO2:  100%  100%  Weight: 74.8 kg     Height: 5\' 11"   (1.803 m)      No intake or output data in the 24 hours ending 03/30/20 1023 Filed Weights   03/29/20 2148 03/30/20 0828  Weight: 74.8 kg 74.8 kg    Exam:  General:  Mild respiratory distress  Cardiovascular: S1, S2 present  Respiratory:  Bilateral wheezing, diminished breath sounds  Abdomen: Soft, nontender, nondistended, bowel sounds present  Musculoskeletal: No bilateral pedal edema noted  Skin: Normal  Psychiatry: Normal mood   Data Reviewed: CBC: Recent Labs  Lab 03/28/20 2120 03/29/20 1501 03/30/20 0341  WBC 10.1 9.5 10.1  NEUTROABS 6.6  --  9.5*  HGB 15.1 15.6 14.7  HCT 44.2 45.7 42.7  MCV 93.6 92.1 92.8  PLT 297 313 270   Basic Metabolic Panel: Recent Labs  Lab 03/28/20 2120 03/29/20 1501 03/30/20 0341  NA 138 136 135  K 4.1 3.5 4.2  CL 104 99 101  CO2 24 25 24   GLUCOSE 98 100* 172*  BUN 7 7 8   CREATININE 0.87 0.69 0.70  CALCIUM 9.2 9.3 8.6*  MG  --   --  2.1  PHOS  --   --  2.7   GFR: Estimated Creatinine Clearance: 136.4 mL/min (by C-G formula based on SCr of 0.7 mg/dL). Liver Function Tests: Recent Labs  Lab 03/28/20 2120 03/30/20 0341  AST 30 27  ALT 29 25  ALKPHOS 52 49  BILITOT 1.2 1.0  PROT 7.5 7.1  ALBUMIN 4.4 3.8   No results for input(s): LIPASE, AMYLASE in the last 168 hours. No results for input(s): AMMONIA in the last 168 hours. Coagulation Profile: No results for input(s): INR, PROTIME in the last 168 hours. Cardiac Enzymes: No results for input(s): CKTOTAL, CKMB, CKMBINDEX, TROPONINI in the last 168 hours. BNP (last 3 results) No results for input(s): PROBNP in the last 8760 hours. HbA1C: No results for input(s): HGBA1C in the last 72 hours. CBG: No results for input(s): GLUCAP in the last 168 hours. Lipid Profile: No results for input(s): CHOL, HDL, LDLCALC, TRIG, CHOLHDL, LDLDIRECT in the last 72 hours. Thyroid Function Tests: Recent Labs    03/30/20 0341  TSH 0.204*   Anemia Panel: No results for  input(s): VITAMINB12, FOLATE, FERRITIN, TIBC, IRON, RETICCTPCT in the last 72 hours. Urine analysis:    Component Value Date/Time   COLORURINE YELLOW (A) 03/30/2020 0414   APPEARANCEUR HAZY (A) 03/30/2020 0414   LABSPEC 1.027 03/30/2020 0414   PHURINE 5.0 03/30/2020 0414   GLUCOSEU NEGATIVE 03/30/2020 0414   HGBUR NEGATIVE 03/30/2020 0414   BILIRUBINUR NEGATIVE 03/30/2020 0414   KETONESUR 20 (A) 03/30/2020 0414   PROTEINUR 30 (A) 03/30/2020 0414   NITRITE NEGATIVE 03/30/2020 0414   LEUKOCYTESUR NEGATIVE 03/30/2020 0414   Sepsis Labs: @LABRCNTIP (procalcitonin:4,lacticidven:4)  ) Recent Results (from the past 240 hour(s))  Respiratory Panel by RT PCR (Flu A&B, Covid) - Nasopharyngeal Swab     Status: None   Collection Time: 03/29/20 11:22 PM   Specimen: Nasopharyngeal Swab  Result Value Ref Range Status   SARS Coronavirus 2 by RT PCR NEGATIVE NEGATIVE Final    Comment: (NOTE) SARS-CoV-2 target nucleic acids  are NOT DETECTED.  The SARS-CoV-2 RNA is generally detectable in upper respiratoy specimens during the acute phase of infection. The lowest concentration of SARS-CoV-2 viral copies this assay can detect is 131 copies/mL. A negative result does not preclude SARS-Cov-2 infection and should not be used as the sole basis for treatment or other patient management decisions. A negative result may occur with  improper specimen collection/handling, submission of specimen other than nasopharyngeal swab, presence of viral mutation(s) within the areas targeted by this assay, and inadequate number of viral copies (<131 copies/mL). A negative result must be combined with clinical observations, patient history, and epidemiological information. The expected result is Negative.  Fact Sheet for Patients:  https://www.moore.com/  Fact Sheet for Healthcare Providers:  https://www.young.biz/  This test is no t yet approved or cleared by the [place] FDA and  has been authorized for detection and/or diagnosis of SARS-CoV-2 by FDA under an Emergency Use Authorization (EUA). This EUA will remain  in effect (meaning this test can be used) for the duration of the COVID-19 declaration under Section 564(b)(1) of the Act, 21 U.S.C. section 360bbb-3(b)(1), unless the authorization is terminated or revoked sooner.     Influenza A by PCR NEGATIVE NEGATIVE Final   Influenza B by PCR NEGATIVE NEGATIVE Final    Comment: (NOTE) The Xpert Xpress SARS-CoV-2/FLU/RSV assay is intended as an aid in  the diagnosis of influenza from Nasopharyngeal swab specimens and  should not be used as a sole basis for treatment. Nasal washings and  aspirates are unacceptable for Xpert Xpress SARS-CoV-2/FLU/RSV  testing.  Fact Sheet for Patients: https://www.moore.com/  Fact Sheet for Healthcare Providers: https://www.young.biz/  This test is not yet approved or cleared by the [place] FDA and  has been authorized for detection and/or diagnosis of SARS-CoV-2 by  FDA under an Emergency Use Authorization (EUA). This EUA will remain  in effect (meaning this test can be used) for the duration of the  Covid-19 declaration under Section 564(b)(1) of the Act, 21  U.S.C. section 360bbb-3(b)(1), unless the authorization is  terminated or revoked. Performed at [facility], [address] [place], [place] [address]   Culture, sputum-assessment     Status: None   Collection Time: 03/30/20  4:14 AM   Specimen: Expectorated Sputum  Result Value Ref Range Status   Specimen Description EXPECTORATED SPUTUM  Final   Special Requests NONE  Final   Sputum evaluation   Final    Sputum specimen not acceptable for testing.  Please recollect.   RESULT CALLED TO, READ BACK BY AND VERIFIED WITH: Alger Simons RN [phone] 03/30/20 HNM Performed at [facility], [address], [place], [place] [address]     Report Status 03/30/2020 FINAL  Final      Studies: DG Chest 2 View  Result Date: 03/29/2020 CLINICAL DATA:  Shortness of breath EXAM: CHEST - 2 VIEW COMPARISON:  None. FINDINGS: The heart size and mediastinal contours are within normal limits. Both lungs are clear. The visualized skeletal structures are unremarkable. IMPRESSION: No active cardiopulmonary disease. Electronically Signed   By: Jonna Clark M.D.   On: 03/29/2020 15:44    Scheduled Meds:  budesonide (PULMICORT) nebulizer solution  2 mg Nebulization Q6H   dextromethorphan-guaiFENesin  1 tablet Oral BID   enoxaparin (LOVENOX) injection  40 mg Subcutaneous Q24H   levalbuterol  0.63 mg Nebulization Q6H   loratadine  10 mg Oral Daily   methylPREDNISolone (SOLU-MEDROL) injection  40 mg Intravenous Q6H  Followed by   Melene Muller ON 03/31/2020] predniSONE  40 mg Oral Q breakfast   mometasone-formoterol  1 puff Inhalation BID   montelukast  10 mg Oral QHS   sodium chloride flush  3 mL Intravenous Q12H    Continuous Infusions:  sodium chloride 75 mL/hr (03/30/20 0230)   azithromycin Stopped (03/30/20 0856)   clindamycin (CLEOCIN) IV Stopped (03/30/20 0751)     LOS: 1 day     Briant Cedar, MD [facility]  If 7PM-7AM, please contact night-coverage [email or web address] 03/30/2020, 10:23 AM

## 2020-03-31 DIAGNOSIS — J4550 Severe persistent asthma, uncomplicated: Secondary | ICD-10-CM

## 2020-03-31 LAB — CBC WITH DIFFERENTIAL/PLATELET
Abs Immature Granulocytes: 0.1 10*3/uL — ABNORMAL HIGH (ref 0.00–0.07)
Basophils Absolute: 0 10*3/uL (ref 0.0–0.1)
Basophils Relative: 0 %
Eosinophils Absolute: 0 10*3/uL (ref 0.0–0.5)
Eosinophils Relative: 0 %
HCT: 40.9 % (ref 39.0–52.0)
Hemoglobin: 14 g/dL (ref 13.0–17.0)
Immature Granulocytes: 1 %
Lymphocytes Relative: 6 %
Lymphs Abs: 0.9 10*3/uL (ref 0.7–4.0)
MCH: 32 pg (ref 26.0–34.0)
MCHC: 34.2 g/dL (ref 30.0–36.0)
MCV: 93.4 fL (ref 80.0–100.0)
Monocytes Absolute: 0.8 10*3/uL (ref 0.1–1.0)
Monocytes Relative: 5 %
Neutro Abs: 13.7 10*3/uL — ABNORMAL HIGH (ref 1.7–7.7)
Neutrophils Relative %: 88 %
Platelets: 288 10*3/uL (ref 150–400)
RBC: 4.38 MIL/uL (ref 4.22–5.81)
RDW: 12.4 % (ref 11.5–15.5)
WBC: 15.6 10*3/uL — ABNORMAL HIGH (ref 4.0–10.5)
nRBC: 0 % (ref 0.0–0.2)

## 2020-03-31 LAB — BASIC METABOLIC PANEL
Anion gap: 10 (ref 5–15)
BUN: 10 mg/dL (ref 6–20)
CO2: 26 mmol/L (ref 22–32)
Calcium: 9.3 mg/dL (ref 8.9–10.3)
Chloride: 101 mmol/L (ref 98–111)
Creatinine, Ser: 0.79 mg/dL (ref 0.61–1.24)
GFR, Estimated: >60 mL/min (ref >60)
Glucose, Bld: 142 mg/dL — ABNORMAL HIGH (ref 70–99)
Potassium: 4.9 mmol/L (ref 3.5–5.1)
Sodium: 137 mmol/L (ref 135–145)

## 2020-03-31 LAB — BLOOD GAS, VENOUS
Acid-base deficit: 4 mmol/L — ABNORMAL HIGH (ref 0.0–2.0)
Bicarbonate: 22.6 mmol/L (ref 20.0–28.0)
O2 Saturation: 76.5 %
Patient temperature: 37
pCO2, Ven: 46 mmHg (ref 44.0–60.0)
pH, Ven: 7.3 (ref 7.250–7.430)
pO2, Ven: 46 mmHg — ABNORMAL HIGH (ref 32.0–45.0)

## 2020-03-31 MED ORDER — CLINDAMYCIN HCL 150 MG PO CAPS
300.0000 mg | ORAL_CAPSULE | Freq: Four times a day (QID) | ORAL | Status: DC
  Administered 2020-03-31 – 2020-04-03 (×11): 300 mg via ORAL
  Filled 2020-03-31 (×13): qty 2

## 2020-03-31 MED ORDER — PANTOPRAZOLE SODIUM 40 MG PO TBEC
40.0000 mg | DELAYED_RELEASE_TABLET | Freq: Every day | ORAL | Status: DC
  Administered 2020-03-31 – 2020-04-02 (×3): 40 mg via ORAL
  Filled 2020-03-31 (×4): qty 1

## 2020-03-31 NOTE — Progress Notes (Signed)
PROGRESS NOTE  Shannon Knapp IWP:[id number] DOB: 08/11/1985 DOA: 03/29/2020 PCP: Patient, No Pcp Per  HPI/Recap of past 24 hours: Shannon Knapp is a 34 y.o. male with medical history significant of chronic asthma steroid dependent/ COPD, presented with worsening SOB, cough and wheezing for the past 2 weeks. Pt has been trying to use his inhalers but hasnt seem to help. Patient's pulmonologist felt that he was overusing his inhaler and developing  tachyphylaxis. Pt was seen early in ED and was able to be discharged home, returned to the ED due to worsening symptoms. No fevers/chills/nausea/vomiting/diarrhea. Of note, patient is chronically steroid-dependent. Recently developed dental pain felt to be secondary to tooth infection. Pt was recently started on clindamycin and his prednisone was stopped due to possible infection. In the ED, patient noted to be tachycardic, tachypneic, afebrile, labs and chest x-ray unremarkable, COVID-19 and flu negative. CT maxillofacial showed large carie of the first right mandibular molar, also noted moderate right facial soft tissue swelling located anterior to the right mandible, consistent with odontogenic cellulitis, diffuse paranasal sinus disease with complete opacification of the ethmoid and frontal sinuses. Admitted for further management. Patient is not vaccinated for Covid and would like to receive it prior to discharge    Today, patient still short of breath, with persistent coughing spells.  Denies any chest pain.    Assessment/Plan: Active Problems:   Asthma exacerbation   Severe persistent asthma   Odontogenic infection of jaw   Acute exacerbation of severe persistent asthma Hx of ABPA Currently saturating well on 2 to 3 L of O2, plan to wean off Still noted to be tachycardic, tachypneic Currently afebrile, now with leukocytosis (on steroids) COVID-19, flu negative Respiratory panel PCR negative VBG no evidence of  hypercapnia Chest x-ray unremarkable Continue steroid taper, DuoNeb, Pulmicort, Brovana, Mucinex DM Continue azithromycin Supplemental oxygen as needed Pulmonary consulted, appreciate care-plan to DC on Symbicort, Spiriva, needs Epipen in order to start Xolair injections Patient is not vaccinated for Covid and would like to receive it prior to discharge  Odontogenic cellulitis Currently afebrile CT maxillofacial showed large carie of the first right mandibular molar, also noted moderate right facial soft tissue swelling located anterior to the right mandible, consistent with odontogenic cellulitis, diffuse paranasal sinus disease with complete opacification of the ethmoid and frontal sinuses Continue clindamycin Outpatient follow-up with dentist  Low TSH Free T4 WNL        Malnutrition Type:      Malnutrition Characteristics:      Nutrition Interventions:       Estimated body mass index is 22.12 kg/m as calculated from the following:   Height as of this encounter: 5\' 11"  (1.803 m).   Weight as of this encounter: 71.9 kg.     Code Status: Full  Family Communication: Discussed with fianc over the phone on 03/30/2020  Disposition Plan: Status is: Inpatient  Remains inpatient appropriate because:Inpatient level of care appropriate due to severity of illness   Dispo: The patient is from: Home              Anticipated d/c is to: Home              Anticipated d/c date is: 2 days              Patient currently is not medically stable to d/c.    Consultants:  Pulmonary  Procedures:  None  Antimicrobials:  Clindamycin  DVT prophylaxis: Lovenox   Objective: Vitals:  03/31/20 0818 03/31/20 0840 03/31/20 1110 03/31/20 1349  BP:  116/70 130/73   Pulse:   (!) 105   Resp:  15 (!) 22   Temp:  98.8 F (37.1 C) 98.7 F (37.1 C)   TempSrc:  Oral Oral   SpO2: 100% 100% 98% 98%  Weight:      Height:        Intake/Output Summary (Last 24 hours) at  03/31/2020 1602 Last data filed at 03/31/2020 1522 Gross per 24 hour  Intake 863 ml  Output 2830 ml  Net -1967 ml   Filed Weights   03/29/20 2148 03/30/20 0828 03/31/20 0418  Weight: 74.8 kg 74.8 kg 71.9 kg    Exam:  General: NAD  Cardiovascular: S1, S2 present  Respiratory:  Diminished breath sounds with some rhonchi noted  Abdomen: Soft, nontender, nondistended, bowel sounds present  Musculoskeletal: No bilateral pedal edema noted  Skin: Normal  Psychiatry: Normal mood   Data Reviewed: CBC: Recent Labs  Lab 03/28/20 2120 03/29/20 1501 03/30/20 0341 03/31/20 0639  WBC 10.1 9.5 10.1 15.6*  NEUTROABS 6.6  --  9.5* 13.7*  HGB 15.1 15.6 14.7 14.0  HCT 44.2 45.7 42.7 40.9  MCV 93.6 92.1 92.8 93.4  PLT 297 313 270 288   Basic Metabolic Panel: Recent Labs  Lab 03/28/20 2120 03/29/20 1501 03/30/20 0341 03/31/20 0639  NA 138 136 135 137  K 4.1 3.5 4.2 4.9  CL 104 99 101 101  CO2 24 25 24 26   GLUCOSE 98 100* 172* 142*  BUN 7 7 8 10   CREATININE 0.87 0.69 0.70 0.79  CALCIUM 9.2 9.3 8.6* 9.3  MG  --   --  2.1  --   PHOS  --   --  2.7  --    GFR: Estimated Creatinine Clearance: 131.1 mL/min (by C-G formula based on SCr of 0.79 mg/dL). Liver Function Tests: Recent Labs  Lab 03/28/20 2120 03/30/20 0341  AST 30 27  ALT 29 25  ALKPHOS 52 49  BILITOT 1.2 1.0  PROT 7.5 7.1  ALBUMIN 4.4 3.8   No results for input(s): LIPASE, AMYLASE in the last 168 hours. No results for input(s): AMMONIA in the last 168 hours. Coagulation Profile: No results for input(s): INR, PROTIME in the last 168 hours. Cardiac Enzymes: No results for input(s): CKTOTAL, CKMB, CKMBINDEX, TROPONINI in the last 168 hours. BNP (last 3 results) No results for input(s): PROBNP in the last 8760 hours. HbA1C: No results for input(s): HGBA1C in the last 72 hours. CBG: No results for input(s): GLUCAP in the last 168 hours. Lipid Profile: No results for input(s): CHOL, HDL, LDLCALC, TRIG,  CHOLHDL, LDLDIRECT in the last 72 hours. Thyroid Function Tests: Recent Labs    03/30/20 0341 03/30/20 1042  TSH 0.204*  --   FREET4  --  0.86   Anemia Panel: No results for input(s): VITAMINB12, FOLATE, FERRITIN, TIBC, IRON, RETICCTPCT in the last 72 hours. Urine analysis:    Component Value Date/Time   COLORURINE YELLOW (A) 03/30/2020 0414   APPEARANCEUR HAZY (A) 03/30/2020 0414   LABSPEC 1.027 03/30/2020 0414   PHURINE 5.0 03/30/2020 0414   GLUCOSEU NEGATIVE 03/30/2020 0414   HGBUR NEGATIVE 03/30/2020 0414   BILIRUBINUR NEGATIVE 03/30/2020 0414   KETONESUR 20 (A) 03/30/2020 0414   PROTEINUR 30 (A) 03/30/2020 0414   NITRITE NEGATIVE 03/30/2020 0414   LEUKOCYTESUR NEGATIVE 03/30/2020 0414   Sepsis Labs: @LABRCNTIP (procalcitonin:4,lacticidven:4)  ) Recent Results (from the past 240 hour(s))  Respiratory  Panel by RT PCR (Flu A&B, Covid) - Nasopharyngeal Swab     Status: None   Collection Time: 03/29/20 11:22 PM   Specimen: Nasopharyngeal Swab  Result Value Ref Range Status   SARS Coronavirus 2 by RT PCR NEGATIVE NEGATIVE Final    Comment: (NOTE) SARS-CoV-2 target nucleic acids are NOT DETECTED.  The SARS-CoV-2 RNA is generally detectable in upper respiratoy specimens during the acute phase of infection. The lowest concentration of SARS-CoV-2 viral copies this assay can detect is 131 copies/mL. A negative result does not preclude SARS-Cov-2 infection and should not be used as the sole basis for treatment or other patient management decisions. A negative result may occur with  improper specimen collection/handling, submission of specimen other than nasopharyngeal swab, presence of viral mutation(s) within the areas targeted by this assay, and inadequate number of viral copies (<131 copies/mL). A negative result must be combined with clinical observations, patient history, and epidemiological information. The expected result is Negative.  Fact Sheet for Patients:   https://www.moore.com/  Fact Sheet for Healthcare Providers:  https://www.young.biz/  This test is no t yet approved or cleared by the [place] FDA and  has been authorized for detection and/or diagnosis of SARS-CoV-2 by FDA under an Emergency Use Authorization (EUA). This EUA will remain  in effect (meaning this test can be used) for the duration of the COVID-19 declaration under Section 564(b)(1) of the Act, 21 U.S.C. section 360bbb-3(b)(1), unless the authorization is terminated or revoked sooner.     Influenza A by PCR NEGATIVE NEGATIVE Final   Influenza B by PCR NEGATIVE NEGATIVE Final    Comment: (NOTE) The Xpert Xpress SARS-CoV-2/FLU/RSV assay is intended as an aid in  the diagnosis of influenza from Nasopharyngeal swab specimens and  should not be used as a sole basis for treatment. Nasal washings and  aspirates are unacceptable for Xpert Xpress SARS-CoV-2/FLU/RSV  testing.  Fact Sheet for Patients: https://www.moore.com/  Fact Sheet for Healthcare Providers: https://www.young.biz/  This test is not yet approved or cleared by the [place] FDA and  has been authorized for detection and/or diagnosis of SARS-CoV-2 by  FDA under an Emergency Use Authorization (EUA). This EUA will remain  in effect (meaning this test can be used) for the duration of the  Covid-19 declaration under Section 564(b)(1) of the Act, 21  U.S.C. section 360bbb-3(b)(1), unless the authorization is  terminated or revoked. Performed at [facility], [address] [place], [place] [address]   Respiratory Panel by PCR     Status: None   Collection Time: 03/30/20  3:41 AM   Specimen: Nasopharyngeal Swab; Respiratory  Result Value Ref Range Status   Adenovirus NOT DETECTED NOT DETECTED Final   Coronavirus 229E NOT DETECTED NOT DETECTED Final    Comment: (NOTE) The Coronavirus on the Respiratory Panel,  DOES NOT test for the novel  Coronavirus (2019 nCoV)    Coronavirus HKU1 NOT DETECTED NOT DETECTED Final   Coronavirus NL63 NOT DETECTED NOT DETECTED Final   Coronavirus OC43 NOT DETECTED NOT DETECTED Final   Metapneumovirus NOT DETECTED NOT DETECTED Final   Rhinovirus / Enterovirus NOT DETECTED NOT DETECTED Final   Influenza A NOT DETECTED NOT DETECTED Final   Influenza B NOT DETECTED NOT DETECTED Final   Parainfluenza Virus 1 NOT DETECTED NOT DETECTED Final   Parainfluenza Virus 2 NOT DETECTED NOT DETECTED Final   Parainfluenza Virus 3 NOT DETECTED NOT DETECTED Final   Parainfluenza Virus 4 NOT DETECTED NOT DETECTED Final  Respiratory Syncytial Virus NOT DETECTED NOT DETECTED Final   Bordetella pertussis NOT DETECTED NOT DETECTED Final   Chlamydophila pneumoniae NOT DETECTED NOT DETECTED Final   Mycoplasma pneumoniae NOT DETECTED NOT DETECTED Final    Comment: Performed at [facility], [address] [place], [place] [address]  Culture, sputum-assessment     Status: None   Collection Time: 03/30/20  4:14 AM   Specimen: Expectorated Sputum  Result Value Ref Range Status   Specimen Description EXPECTORATED SPUTUM  Final   Special Requests NONE  Final   Sputum evaluation   Final    Sputum specimen not acceptable for testing.  Please recollect.   RESULT CALLED TO, READ BACK BY AND VERIFIED WITH: Alger SimonsIGE JOHNSON RN [phone] 03/30/20 HNM Performed at [facility], [address] [place], [place] [address]    Report Status 03/30/2020 FINAL  Final      Studies: No results found.  Scheduled Meds: . arformoterol  15 mcg Nebulization BID  . budesonide (PULMICORT) nebulizer solution  0.5 mg Nebulization BID  . clindamycin  300 mg Oral Q6H  . dextromethorphan-guaiFENesin  1 tablet Oral BID  . enoxaparin (LOVENOX) injection  40 mg Subcutaneous Q24H  . loratadine  10 mg Oral Daily  . pantoprazole  40 mg Oral Daily  . predniSONE  40 mg Oral Q breakfast  . sodium chloride  flush  3 mL Intravenous Q12H    Continuous Infusions: . azithromycin Stopped (03/31/20 1026)     LOS: 2 days     Briant CedarNkeiruka J Travas Schexnayder, MD [facility]  If 7PM-7AM, please contact night-coverage [email or web address] 03/31/2020, 4:02 PM

## 2020-03-31 NOTE — Plan of Care (Signed)
  Problem: Clinical Measurements: Goal: Ability to maintain clinical measurements within normal limits will improve Outcome: Progressing   Problem: Clinical Measurements: Goal: Will remain free from infection Outcome: Progressing   Problem: Clinical Measurements: Goal: Diagnostic test results will improve Outcome: Progressing   Problem: Clinical Measurements: Goal: Respiratory complications will improve Outcome: Progressing   Problem: Clinical Measurements: Goal: Cardiovascular complication will be avoided Outcome: Progressing   Problem: Clinical Measurements: Goal: Diagnostic test results will improve Outcome: Progressing

## 2020-03-31 NOTE — TOC Initial Note (Signed)
Transition of Care [facility]) - Initial/Assessment Note    Patient Details  Name: CAMDON SAETERN MRN: [id number] Date of Birth: Feb 21, 1986  Transition [facility]) CM/SW Contact:    Hetty Ely, RN Phone Number: 03/31/2020, 10:19 AM  Clinical Narrative:   Spoke with patient, who is cooperative, alert and oriented x4, lives with [place] and 35yo son. Patient voices using Nebulizer treatments at home, machine provided by provider in [place] long time ago. Son is also using nebulizer. Family assist as needed. Not currently taking any prescribed medication, if medication is needed will [facility],no PCP. Patient states he will establish PCP at the [facility] clinic when discharged.                Expected Discharge Plan: Home/Self Care Barriers to Discharge: Continued Medical Work up   Patient Goals and CMS Choice Patient states their goals for this hospitalization and ongoing recovery are:: Go home and establish care with provider in the [facility] clinic.      Expected Discharge Plan and Services Expected Discharge Plan: Home/Self Care       Living arrangements for the past 2 months: Single Family Home                                      Prior Living Arrangements/Services Living arrangements for the past 2 months: Single Family Home Lives with:: Minor Children, Self, Significant Other Patient language and need for interpreter reviewed:: Yes Do you feel safe going back to the place where you live?: Yes      Need for Family Participation in Patient Care: Yes (Comment) Care giver support system in place?: Yes (comment)   Criminal Activity/Legal Involvement Pertinent to Current Situation/Hospitalization: No - Comment as needed  Activities of Daily Living Home Assistive Devices/Equipment: None ADL Screening (condition at time of admission) Patient's cognitive ability adequate to safely complete daily activities?: Yes Is the patient  deaf or have difficulty hearing?: No Does the patient have difficulty seeing, even when wearing glasses/contacts?: No Does the patient have difficulty concentrating, remembering, or making decisions?: No Patient able to express need for assistance with ADLs?: Yes Does the patient have difficulty dressing or bathing?: No Independently performs ADLs?: Yes (appropriate for developmental age) Does the patient have difficulty walking or climbing stairs?: No Weakness of Legs: None Weakness of Arms/Hands: None  Permission Sought/Granted                  Emotional Assessment Appearance:: Appears stated age Attitude/Demeanor/Rapport: Engaged, Self-Confident Affect (typically observed): Accepting, Adaptable, Appropriate Orientation: : Oriented to Self, Oriented to Place, Oriented to  Time, Oriented to Situation Alcohol / Substance Use: Not Applicable Psych Involvement: No (comment)  Admission diagnosis:  Moderate persistent asthma with exacerbation [J45.41] Asthma exacerbation [J45.901] Patient Active Problem List   Diagnosis Date Noted  . 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 Community acquired pneumonia of left lower lobe of lung 10/25/2019  . Medication management 04/30/2019  . Healthcare maintenance 11/26/2018  . Severe persistent asthma 05/28/2018  . Acute febrile illness 04/10/2018  . Upper airway cough syndrome   . Hypoxemia   . Syncope   . Allergic rhinitis   . Cough   . Hemoptysis   . Protein-calorie malnutrition, severe 10/20/2017  . Acute respiratory failure with hypoxemia (HCC) 10/20/2017  . Acute  respiratory distress   . Asthma exacerbation   . Status asthmaticus 10/18/2017  . Severe persistent acute asthmatic bronchitis 10/17/2017  . Elevated blood pressure reading without diagnosis of hypertension 10/17/2017  . Respiratory distress 10/17/2017   PCP:  Patient, No Pcp Per Pharmacy:    [facility] - [place], [place] - [address] [place] [address] Phone: [phone] Fax: [phone]  [facility] [address] - [place], [place] - [address] [place] [address] Phone: [phone] Fax: [phone]     Social Determinants of Health (SDOH) Interventions    Readmission Risk Interventions No flowsheet data found.

## 2020-03-31 NOTE — Plan of Care (Signed)
  Problem: Health Behavior/Discharge Planning: Goal: Ability to manage health-related needs will improve Outcome: Progressing   Problem: Clinical Measurements: Goal: Will remain free from infection Outcome: Progressing   Problem: Clinical Measurements: Goal: Respiratory complications will improve Outcome: Progressing   Problem: Clinical Measurements: Goal: Cardiovascular complication will be avoided Outcome: Progressing   

## 2020-03-31 NOTE — Progress Notes (Signed)
Subjective:    Patient ID: Shannon Knapp, male    DOB: 1986/05/03, 34 y.o.   MRN: [id number]  Subjective: Feels much better today.  Face less swollen.  Less dyspneic.  Was able to speak with case manager.  Review of Systems A 10 point review of systems was performed and it is as noted above otherwise negative.  Allergies  Allergen Reactions  . Amoxicillin Hives    Hives all over the body. [address] [facility] [employer]. Has patient had a PCN reaction causing immediate rash, facial/tongue/throat swelling, SOB or lightheadedness with hypotension: Yes Has patient had a PCN reaction causing severe rash involving mucus membranes or skin necrosis: No Has patient had a PCN reaction that required hospitalization: No Has patient had a PCN reaction occurring within the last 10 years: Yes If all of the above answers are "NO", then may proceed with Cephalosporin use.  Marland Kitchen Doxycycline   . Fish Allergy Swelling  . Shellfish Allergy Swelling  . Penicillins Hives    Has patient had a PCN reaction causing immediate rash, facial/tongue/throat swelling, SOB or lightheadedness with hypotension: Yes Has patient had a PCN reaction causing severe rash involving mucus membranes or skin necrosis: No Has patient had a PCN reaction that required hospitalization: No Has patient had a PCN reaction occurring within the last 10 years: Yes If all of the above answers are "NO", then may proceed with Cephalosporin use.   Scheduled Meds: . arformoterol  15 mcg Nebulization BID  . budesonide (PULMICORT) nebulizer solution  0.5 mg Nebulization BID  . clindamycin  300 mg Oral Q6H  . dextromethorphan-guaiFENesin  1 tablet Oral BID  . enoxaparin (LOVENOX) injection  40 mg Subcutaneous Q24H  . loratadine  10 mg Oral Daily  . pantoprazole  40 mg Oral Daily  . predniSONE  40 mg Oral Q breakfast  . sodium chloride flush  3 mL Intravenous Q12H   Continuous Infusions: . azithromycin Stopped (03/31/20 1026)   PRN  Meds:.acetaminophen **OR** acetaminophen, HYDROcodone-acetaminophen, ipratropium-albuterol, lidocaine     Objective:   Physical Exam BP 128/76 (BP Location: Left Arm)   Pulse 86   Temp 98 F (36.7 C) (Oral)   Resp 20   Ht 5\' 11"  (1.803 m)   Wt 71.9 kg   SpO2 100%   BMI 22.12 kg/m  GENERAL: Well-developed, well-nourished gentleman in no acute distress.  Poor dentition.   HEAD: Normocephalic, atraumatic.   Facial swelling almost imperceptible. EYES: Pupils equal, round, reactive to light.  No scleral icterus.  MOUTH: Poor dentition, caries on right. NECK: Supple. No thyromegaly. Trachea midline. No JVD.  No adenopathy. PULMONARY: Symmetrical air entry, coarse breath sounds with prolonged I: E at 1:3 however no major wheezing.  Moving air fairly well. CARDIOVASCULAR: S1 and S2.  Mildly tachycardic rate with regular rhythm.  No rubs, murmurs or gallops heard. ABDOMEN: Benign. MUSCULOSKELETAL: No joint deformity, no clubbing, no edema.  NEUROLOGIC: No overt focal deficit. SKIN: Intact,warm,dry. PSYCH: Mood and behavior normal.     Assessment & Plan:  Acute exacerbation of severe persistent asthma Triggers: Dental/facial infection and ethmoid sinusitis History of ABPA Noncompliance with medical regimen due to finances  Doing well with arformoterol and Pulmicort in house  This is essentially Symbicort.  Appreciate case manager consultation  Upon discharge: Symbicort 160/4.5, 2 inhalations twice a day and Spiriva 1.25 mcg 2 puffs daily  Procure EpiPen so he can start Xolair injections (check with medication assistance pharmacy)  Xolair has already been approved  for this patient and is being provided at no cost to him needs EpiPen  Taper prednisone to no lower than 10 mg daily, maintain on 10 mg daily after r taper  Weaning off steroids will continue as outpatient  If patient is discharged over the weekend he is to call our office on Monday 15 November so that we can arrange  for a follow-up with me or our APP  Ethmoid sinusitis  Nasal hygiene  May need ENT evaluation in the future as outpatient  Continue antibiotics  Dental caries/abscess  Will need evaluation by dentist as outpatient  Continue antibiotics  Patient has been previously referred to [facility].  Appreciate case management reiterating this.  They can also assist with his medications.  We will sign off.  Gailen Shelter, MD  PCCM   *This note was dictated using voice recognition software/Dragon.  Despite best efforts to proofread, errors can occur which can change the meaning.  Any change was purely unintentional.

## 2020-04-01 LAB — CBC WITH DIFFERENTIAL/PLATELET
Abs Immature Granulocytes: 0.05 10*3/uL (ref 0.00–0.07)
Basophils Absolute: 0 10*3/uL (ref 0.0–0.1)
Basophils Relative: 0 %
Eosinophils Absolute: 0.3 10*3/uL (ref 0.0–0.5)
Eosinophils Relative: 3 %
HCT: 41.5 % (ref 39.0–52.0)
Hemoglobin: 14.2 g/dL (ref 13.0–17.0)
Immature Granulocytes: 1 %
Lymphocytes Relative: 32 %
Lymphs Abs: 3.1 10*3/uL (ref 0.7–4.0)
MCH: 31.4 pg (ref 26.0–34.0)
MCHC: 34.2 g/dL (ref 30.0–36.0)
MCV: 91.8 fL (ref 80.0–100.0)
Monocytes Absolute: 0.6 10*3/uL (ref 0.1–1.0)
Monocytes Relative: 6 %
Neutro Abs: 5.7 10*3/uL (ref 1.7–7.7)
Neutrophils Relative %: 58 %
Platelets: 296 10*3/uL (ref 150–400)
RBC: 4.52 MIL/uL (ref 4.22–5.81)
RDW: 12.5 % (ref 11.5–15.5)
WBC: 9.9 10*3/uL (ref 4.0–10.5)
nRBC: 0 % (ref 0.0–0.2)

## 2020-04-01 LAB — BASIC METABOLIC PANEL
Anion gap: 10 (ref 5–15)
BUN: 11 mg/dL (ref 6–20)
CO2: 28 mmol/L (ref 22–32)
Calcium: 9.1 mg/dL (ref 8.9–10.3)
Chloride: 100 mmol/L (ref 98–111)
Creatinine, Ser: 0.83 mg/dL (ref 0.61–1.24)
GFR, Estimated: >60 mL/min (ref >60)
Glucose, Bld: 103 mg/dL — ABNORMAL HIGH (ref 70–99)
Potassium: 3.8 mmol/L (ref 3.5–5.1)
Sodium: 138 mmol/L (ref 135–145)

## 2020-04-01 LAB — IMMUNOGLOBULINS A/E/G/M, SERUM
IgA: 214 mg/dL (ref 90–386)
IgE (Immunoglobulin E), Serum: 2238 IU/mL — ABNORMAL HIGH (ref 6–495)
IgG (Immunoglobin G), Serum: 835 mg/dL (ref 603–1613)
IgM (Immunoglobulin M), Srm: 138 mg/dL (ref 20–172)

## 2020-04-01 NOTE — Plan of Care (Signed)
  Problem: Education: Goal: Knowledge of General Education information will improve Description: Including pain rating scale, medication(s)/side effects and non-pharmacologic comfort measures 04/01/2020 0835 by Ansel Bong, RN Outcome: Progressing 04/01/2020 0835 by Ansel Bong, RN Outcome: Progressing   Problem: Health Behavior/Discharge Planning: Goal: Ability to manage health-related needs will improve 04/01/2020 0835 by Ansel Bong, RN Outcome: Progressing 04/01/2020 0835 by Ansel Bong, RN Outcome: Progressing   Problem: Clinical Measurements: Goal: Ability to maintain clinical measurements within normal limits will improve 04/01/2020 0835 by Ansel Bong, RN Outcome: Progressing 04/01/2020 0835 by Ansel Bong, RN Outcome: Progressing Goal: Will remain free from infection 04/01/2020 0835 by Ansel Bong, RN Outcome: Progressing 04/01/2020 0835 by Ansel Bong, RN Outcome: Progressing Goal: Diagnostic test results will improve 04/01/2020 0835 by Ansel Bong, RN Outcome: Progressing 04/01/2020 0835 by Ansel Bong, RN Outcome: Progressing Goal: Respiratory complications will improve 04/01/2020 0835 by Ansel Bong, RN Outcome: Progressing 04/01/2020 0835 by Ansel Bong, RN Outcome: Progressing Goal: Cardiovascular complication will be avoided 04/01/2020 0835 by Ansel Bong, RN Outcome: Progressing 04/01/2020 0835 by Ansel Bong, RN Outcome: Progressing   Problem: Activity: Goal: Risk for activity intolerance will decrease 04/01/2020 0835 by Ansel Bong, RN Outcome: Progressing 04/01/2020 0835 by Ansel Bong, RN Outcome: Progressing   Problem: Nutrition: Goal: Adequate nutrition will be maintained 04/01/2020 0835 by Ansel Bong, RN Outcome: Progressing 04/01/2020 0835 by Ansel Bong, RN Outcome: Progressing   Problem: Coping: Goal: Level of anxiety will decrease 04/01/2020 0835 by Ansel Bong, RN Outcome:  Progressing 04/01/2020 0835 by Ansel Bong, RN Outcome: Progressing   Problem: Elimination: Goal: Will not experience complications related to bowel motility 04/01/2020 0835 by Ansel Bong, RN Outcome: Progressing 04/01/2020 0835 by Ansel Bong, RN Outcome: Progressing Goal: Will not experience complications related to urinary retention 04/01/2020 0835 by Ansel Bong, RN Outcome: Progressing 04/01/2020 0835 by Ansel Bong, RN Outcome: Progressing   Problem: Pain Managment: Goal: General experience of comfort will improve 04/01/2020 0835 by Ansel Bong, RN Outcome: Progressing 04/01/2020 0835 by Ansel Bong, RN Outcome: Progressing   Problem: Safety: Goal: Ability to remain free from injury will improve 04/01/2020 0835 by Ansel Bong, RN Outcome: Progressing 04/01/2020 0835 by Ansel Bong, RN Outcome: Progressing   Problem: Skin Integrity: Goal: Risk for impaired skin integrity will decrease 04/01/2020 0835 by Ansel Bong, RN Outcome: Progressing 04/01/2020 0835 by Ansel Bong, RN Outcome: Progressing

## 2020-04-01 NOTE — Progress Notes (Signed)
PROGRESS NOTE  Shannon BennettRayshawn M Knapp ZOX:[id number] DOB: 08/05/1985 DOA: 03/29/2020 PCP: Patient, No Pcp Per  HPI/Recap of past 24 hours: Shannon Knapp is a 34 y.o. male with medical history significant of chronic asthma steroid dependent/ COPD, presented with worsening SOB, cough and wheezing for the past 2 weeks. Pt has been trying to use his inhalers but hasnt seem to help. Patient's pulmonologist felt that he was overusing his inhaler and developing  tachyphylaxis. Pt was seen early in ED and was able to be discharged home, returned to the ED due to worsening symptoms. No fevers/chills/nausea/vomiting/diarrhea. Of note, patient is chronically steroid-dependent. Recently developed dental pain felt to be secondary to tooth infection. Pt was recently started on clindamycin and his prednisone was stopped due to possible infection. In the ED, patient noted to be tachycardic, tachypneic, afebrile, labs and chest x-ray unremarkable, COVID-19 and flu negative. CT maxillofacial showed large carie of the first right mandibular molar, also noted moderate right facial soft tissue swelling located anterior to the right mandible, consistent with odontogenic cellulitis, diffuse paranasal sinus disease with complete opacification of the ethmoid and frontal sinuses. Admitted for further management. Patient is not vaccinated for Covid and would like to receive it prior to discharge    Today, patient still with coughing spells, minimal improvement overall.  Denies any left-sided chest pain, abdominal pain, nausea/vomiting, fever/chills.    Assessment/Plan: Active Problems:   Asthma exacerbation   Severe persistent asthma   Odontogenic infection of jaw   Acute exacerbation of severe persistent asthma Hx of ABPA Currently saturating well on 2L of O2, plan to wean off Still noted to be intermittently tachycardic Currently afebrile COVID-19, flu negative Respiratory panel PCR negative VBG no evidence  of hypercapnia Chest x-ray unremarkable Continue steroid taper, DuoNeb, Pulmicort, Brovana, Mucinex DM Continue azithromycin Supplemental oxygen as needed Pulmonary consulted, appreciate care-plan to DC on Symbicort, Spiriva, needs Epipen in order to start Xolair injections Patient is not vaccinated for Covid and would like to receive it prior to discharge  Odontogenic cellulitis Currently afebrile CT maxillofacial showed large carie of the first right mandibular molar, also noted moderate right facial soft tissue swelling located anterior to the right mandible, consistent with odontogenic cellulitis, diffuse paranasal sinus disease with complete opacification of the ethmoid and frontal sinuses Continue clindamycin Outpatient follow-up with dentist  Low TSH Free T4 WNL        Malnutrition Type:      Malnutrition Characteristics:      Nutrition Interventions:       Estimated body mass index is 22.12 kg/m as calculated from the following:   Height as of this encounter: 5\' 11"  (1.803 m).   Weight as of this encounter: 71.9 kg.     Code Status: Full  Family Communication: Discussed with fianc over the phone on 03/30/2020  Disposition Plan: Status is: Inpatient  Remains inpatient appropriate because:Inpatient level of care appropriate due to severity of illness   Dispo: The patient is from: Home              Anticipated d/c is to: Home              Anticipated d/c date is: 2 days              Patient currently is not medically stable to d/c.    Consultants:  Pulmonary  Procedures:  None  Antimicrobials:  Clindamycin  DVT prophylaxis: Lovenox   Objective: Vitals:   03/31/20 2012 03/31/20  2358 04/01/20 0500 04/01/20 0759  BP: 125/81 123/77 119/88 123/83  Pulse: 84 70 75 74  Resp: 19 18 19 16   Temp: 98.4 F (36.9 C) 98.7 F (37.1 C) 98.4 F (36.9 C) 98.2 F (36.8 C)  TempSrc:  Oral Oral Axillary  SpO2: 100% 97% 98% 98%  Weight:        Height:        Intake/Output Summary (Last 24 hours) at 04/01/2020 1536 Last data filed at 04/01/2020 1027 Gross per 24 hour  Intake 590 ml  Output --  Net 590 ml   Filed Weights   03/29/20 2148 03/30/20 0828 03/31/20 0418  Weight: 74.8 kg 74.8 kg 71.9 kg    Exam:  General: NAD  Cardiovascular: S1, S2 present  Respiratory:  Diminished breath sounds with some rhonchi noted  Abdomen: Soft, nontender, nondistended, bowel sounds present  Musculoskeletal: No bilateral pedal edema noted  Skin: Normal  Psychiatry: Normal mood   Data Reviewed: CBC: Recent Labs  Lab 03/28/20 2120 03/29/20 1501 03/30/20 0341 03/31/20 0639 04/01/20 0640  WBC 10.1 9.5 10.1 15.6* 9.9  NEUTROABS 6.6  --  9.5* 13.7* 5.7  HGB 15.1 15.6 14.7 14.0 14.2  HCT 44.2 45.7 42.7 40.9 41.5  MCV 93.6 92.1 92.8 93.4 91.8  PLT 297 313 270 288 296   Basic Metabolic Panel: Recent Labs  Lab 03/28/20 2120 03/29/20 1501 03/30/20 0341 03/31/20 0639 04/01/20 0640  NA 138 136 135 137 138  K 4.1 3.5 4.2 4.9 3.8  CL 104 99 101 101 100  CO2 24 25 24 26 28   GLUCOSE 98 100* 172* 142* 103*  BUN 7 7 8 10 11   CREATININE 0.87 0.69 0.70 0.79 0.83  CALCIUM 9.2 9.3 8.6* 9.3 9.1  MG  --   --  2.1  --   --   PHOS  --   --  2.7  --   --    GFR: Estimated Creatinine Clearance: 126.3 mL/min (by C-G formula based on SCr of 0.83 mg/dL). Liver Function Tests: Recent Labs  Lab 03/28/20 2120 03/30/20 0341  AST 30 27  ALT 29 25  ALKPHOS 52 49  BILITOT 1.2 1.0  PROT 7.5 7.1  ALBUMIN 4.4 3.8   No results for input(s): LIPASE, AMYLASE in the last 168 hours. No results for input(s): AMMONIA in the last 168 hours. Coagulation Profile: No results for input(s): INR, PROTIME in the last 168 hours. Cardiac Enzymes: No results for input(s): CKTOTAL, CKMB, CKMBINDEX, TROPONINI in the last 168 hours. BNP (last 3 results) No results for input(s): PROBNP in the last 8760 hours. HbA1C: No results for input(s):  HGBA1C in the last 72 hours. CBG: No results for input(s): GLUCAP in the last 168 hours. Lipid Profile: No results for input(s): CHOL, HDL, LDLCALC, TRIG, CHOLHDL, LDLDIRECT in the last 72 hours. Thyroid Function Tests: Recent Labs    03/30/20 0341 03/30/20 1042  TSH 0.204*  --   FREET4  --  0.86   Anemia Panel: No results for input(s): VITAMINB12, FOLATE, FERRITIN, TIBC, IRON, RETICCTPCT in the last 72 hours. Urine analysis:    Component Value Date/Time   COLORURINE YELLOW (A) 03/30/2020 0414   APPEARANCEUR HAZY (A) 03/30/2020 0414   LABSPEC 1.027 03/30/2020 0414   PHURINE 5.0 03/30/2020 0414   GLUCOSEU NEGATIVE 03/30/2020 0414   HGBUR NEGATIVE 03/30/2020 0414   BILIRUBINUR NEGATIVE 03/30/2020 0414   KETONESUR 20 (A) 03/30/2020 0414   PROTEINUR 30 (A) 03/30/2020 0414   NITRITE  NEGATIVE 03/30/2020 0414   LEUKOCYTESUR NEGATIVE 03/30/2020 0414   Sepsis Labs: @LABRCNTIP (procalcitonin:4,lacticidven:4)  ) Recent Results (from the past 240 hour(s))  Respiratory Panel by RT PCR (Flu A&B, Covid) - Nasopharyngeal Swab     Status: None   Collection Time: 03/29/20 11:22 PM   Specimen: Nasopharyngeal Swab  Result Value Ref Range Status   SARS Coronavirus 2 by RT PCR NEGATIVE NEGATIVE Final    Comment: (NOTE) SARS-CoV-2 target nucleic acids are NOT DETECTED.  The SARS-CoV-2 RNA is generally detectable in upper respiratoy specimens during the acute phase of infection. The lowest concentration of SARS-CoV-2 viral copies this assay can detect is 131 copies/mL. A negative result does not preclude SARS-Cov-2 infection and should not be used as the sole basis for treatment or other patient management decisions. A negative result may occur with  improper specimen collection/handling, submission of specimen other than nasopharyngeal swab, presence of viral mutation(s) within the areas targeted by this assay, and inadequate number of viral copies (<131 copies/mL). A negative result must  be combined with clinical observations, patient history, and epidemiological information. The expected result is Negative.  Fact Sheet for Patients:  13/10/21  Fact Sheet for Healthcare Providers:  https://www.moore.com/  This test is no t yet approved or cleared by the https://www.young.biz/ FDA and  has been authorized for detection and/or diagnosis of SARS-CoV-2 by FDA under an Emergency Use Authorization (EUA). This EUA will remain  in effect (meaning this test can be used) for the duration of the COVID-19 declaration under Section 564(b)(1) of the Act, 21 U.S.C. section 360bbb-3(b)(1), unless the authorization is terminated or revoked sooner.     Influenza A by PCR NEGATIVE NEGATIVE Final   Influenza B by PCR NEGATIVE NEGATIVE Final    Comment: (NOTE) The Xpert Xpress SARS-CoV-2/FLU/RSV assay is intended as an aid in  the diagnosis of influenza from Nasopharyngeal swab specimens and  should not be used as a sole basis for treatment. Nasal washings and  aspirates are unacceptable for Xpert Xpress SARS-CoV-2/FLU/RSV  testing.  Fact Sheet for Patients: [place]  Fact Sheet for Healthcare Providers: https://www.moore.com/  This test is not yet approved or cleared by the https://www.young.biz/ FDA and  has been authorized for detection and/or diagnosis of SARS-CoV-2 by  FDA under an Emergency Use Authorization (EUA). This EUA will remain  in effect (meaning this test can be used) for the duration of the  Covid-19 declaration under Section 564(b)(1) of the Act, 21  U.S.C. section 360bbb-3(b)(1), unless the authorization is  terminated or revoked. Performed at [facility], [address] [place], [place]   Respiratory Panel by PCR     Status: None   Collection Time: 03/30/20  3:41 AM   Specimen: Nasopharyngeal Swab; Respiratory  Result Value Ref Range Status    Adenovirus NOT DETECTED NOT DETECTED Final   Coronavirus 229E NOT DETECTED NOT DETECTED Final    Comment: (NOTE) The Coronavirus on the Respiratory Panel, DOES NOT test for the novel  Coronavirus (2019 nCoV)    Coronavirus HKU1 NOT DETECTED NOT DETECTED Final   Coronavirus NL63 NOT DETECTED NOT DETECTED Final   Coronavirus OC43 NOT DETECTED NOT DETECTED Final   Metapneumovirus NOT DETECTED NOT DETECTED Final   Rhinovirus / Enterovirus NOT DETECTED NOT DETECTED Final   Influenza A NOT DETECTED NOT DETECTED Final   Influenza B NOT DETECTED NOT DETECTED Final   Parainfluenza Virus 1 NOT DETECTED NOT DETECTED Final   Parainfluenza Virus 2 NOT  DETECTED NOT DETECTED Final   Parainfluenza Virus 3 NOT DETECTED NOT DETECTED Final   Parainfluenza Virus 4 NOT DETECTED NOT DETECTED Final   Respiratory Syncytial Virus NOT DETECTED NOT DETECTED Final   Bordetella pertussis NOT DETECTED NOT DETECTED Final   Chlamydophila pneumoniae NOT DETECTED NOT DETECTED Final   Mycoplasma pneumoniae NOT DETECTED NOT DETECTED Final    Comment: Performed at [facility], [address] [place], [place] [address]  Culture, sputum-assessment     Status: None   Collection Time: 03/30/20  4:14 AM   Specimen: Expectorated Sputum  Result Value Ref Range Status   Specimen Description EXPECTORATED SPUTUM  Final   Special Requests NONE  Final   Sputum evaluation   Final    Sputum specimen not acceptable for testing.  Please recollect.   RESULT CALLED TO, READ BACK BY AND VERIFIED WITH: Alger Simons RN [phone] 03/30/20 HNM Performed at [facility], [address], [place], [place] [address]    Report Status 03/30/2020 FINAL  Final      Studies: No results found.  Scheduled Meds: . arformoterol  15 mcg Nebulization BID  . budesonide (PULMICORT) nebulizer solution  0.5 mg Nebulization BID  . clindamycin  300 mg Oral Q6H  . dextromethorphan-guaiFENesin  1 tablet Oral BID  . enoxaparin (LOVENOX)  injection  40 mg Subcutaneous Q24H  . loratadine  10 mg Oral Daily  . pantoprazole  40 mg Oral Daily  . predniSONE  40 mg Oral Q breakfast  . sodium chloride flush  3 mL Intravenous Q12H    Continuous Infusions: . azithromycin 500 mg (04/01/20 5427)     LOS: 3 days     Briant Cedar, MD [facility]  If 7PM-7AM, please contact night-coverage [email or web address] 04/01/2020, 3:36 PM

## 2020-04-02 ENCOUNTER — Other Ambulatory Visit: Payer: Self-pay

## 2020-04-02 MED ORDER — PANTOPRAZOLE SODIUM 40 MG PO TBEC: 40.0000 mg | DELAYED_RELEASE_TABLET | Freq: Two times a day (BID) | ORAL | 0 refills | Status: DC

## 2020-04-02 MED ORDER — BUDESONIDE-FORMOTEROL FUMARATE 160-4.5 MCG/ACT IN AERO: 2.0000 | INHALATION_SPRAY | Freq: Two times a day (BID) | RESPIRATORY_TRACT | 5 refills | Status: DC

## 2020-04-02 MED ORDER — SPIRIVA RESPIMAT 1.25 MCG/ACT IN AERS: 2.0000 | INHALATION_SPRAY | Freq: Every day | RESPIRATORY_TRACT | 0 refills | Status: DC

## 2020-04-02 MED ORDER — EPINEPHRINE 0.3 MG/0.3ML IJ SOAJ: 0.3000 mg | INTRAMUSCULAR | 0 refills | Status: DC | PRN

## 2020-04-02 MED ORDER — DM-GUAIFENESIN ER 30-600 MG PO TB12: 1.0000 | ORAL_TABLET | Freq: Two times a day (BID) | ORAL | 0 refills | Status: AC

## 2020-04-02 MED ORDER — PREDNISONE 10 MG PO TABS: ORAL_TABLET | ORAL | 0 refills | Status: DC

## 2020-04-02 MED ORDER — CETIRIZINE HCL 10 MG PO TABS: 10.0000 mg | ORAL_TABLET | Freq: Every day | ORAL | 0 refills | Status: DC

## 2020-04-02 NOTE — Plan of Care (Signed)

## 2020-04-02 NOTE — Progress Notes (Signed)
Pt ambulated 600 ft in hallway with NT.  Tolerated well, O2 sat stable at 100 on room air.  RN will continue to monitor.  Ulis Rias, RN 04/02/2020 2:51 PM

## 2020-04-02 NOTE — Progress Notes (Signed)
PROGRESS NOTE  Shannon Knapp:[id number] DOB: April 17, 1986 DOA: 03/29/2020 PCP: Patient, No Pcp Per  HPI/Recap of past 24 hours: Shannon Knapp is a 34 y.o. male with medical history significant of chronic asthma steroid dependent/ COPD, presented with worsening SOB, cough and wheezing for the past 2 weeks. Pt has been trying to use his inhalers but hasnt seem to help. Patient's pulmonologist felt that he was overusing his inhaler and developing  tachyphylaxis. Pt was seen early in ED and was able to be discharged home, returned to the ED due to worsening symptoms. No fevers/chills/nausea/vomiting/diarrhea. Of note, patient is chronically steroid-dependent. Recently developed dental pain felt to be secondary to tooth infection. Pt was recently started on clindamycin and his prednisone was stopped due to possible infection. In the ED, patient noted to be tachycardic, tachypneic, afebrile, labs and chest x-ray unremarkable, COVID-19 and flu negative. CT maxillofacial showed large carie of the first right mandibular molar, also noted moderate right facial soft tissue swelling located anterior to the right mandible, consistent with odontogenic cellulitis, diffuse paranasal sinus disease with complete opacification of the ethmoid and frontal sinuses. Admitted for further management. Patient is not vaccinated for Covid and would like to receive it prior to discharge     Today, patient still with coughing spells, but reports breathing has improved.  Denies any chest pain, abdominal pain, nausea/vomiting, fever/chills.    Assessment/Plan: Active Problems:   Asthma exacerbation   Severe persistent asthma   Odontogenic infection of jaw   Acute exacerbation of severe persistent asthma Hx of ABPA Currently saturating well on RA, weaned of O2 Still noted to be intermittently tachycardic Currently afebrile COVID-19, flu negative Respiratory panel PCR negative VBG no evidence of  hypercapnia Chest x-ray unremarkable Continue steroid taper, DuoNeb, Pulmicort, Brovana, Mucinex DM Continue azithromycin Supplemental oxygen as needed Pulmonary consulted, appreciate care-plan to DC on Symbicort, Spiriva, needs Epipen in order to start Xolair injections Patient is not vaccinated for Covid and would like to receive it prior to discharge  Odontogenic cellulitis Currently afebrile CT maxillofacial showed large carie of the first right mandibular molar, also noted moderate right facial soft tissue swelling located anterior to the right mandible, consistent with odontogenic cellulitis, diffuse paranasal sinus disease with complete opacification of the ethmoid and frontal sinuses Continue clindamycin Outpatient follow-up with dentist  Low TSH Free T4 WNL        Malnutrition Type:      Malnutrition Characteristics:      Nutrition Interventions:       Estimated body mass index is 21.98 kg/m as calculated from the following:   Height as of this encounter: 5\' 11"  (1.803 m).   Weight as of this encounter: 71.5 kg.     Code Status: Full  Family Communication: Discussed with fianc over the phone on 03/30/2020  Disposition Plan: Status is: Inpatient  Remains inpatient appropriate because:Inpatient level of care appropriate due to severity of illness   Dispo: The patient is from: Home              Anticipated d/c is to: Home              Anticipated d/c date is: 1 day              Patient currently is not medically stable to d/c.    Consultants:  Pulmonary  Procedures:  None  Antimicrobials:  Clindamycin  DVT prophylaxis: Lovenox   Objective: Vitals:   04/01/20 1942 04/01/20 2100  04/02/20 0353 04/02/20 0700  BP: 120/69  (!) 148/76 120/86  Pulse: 88  (!) 102 79  Resp: 18     Temp: 98.2 F (36.8 C)  97.6 F (36.4 C) 98.4 F (36.9 C)  TempSrc: Oral  Oral Oral  SpO2: 93% 100% 98% 99%  Weight:   71.5 kg   Height:         Intake/Output Summary (Last 24 hours) at 04/02/2020 1625 Last data filed at 04/01/2020 1846 Gross per 24 hour  Intake 240 ml  Output --  Net 240 ml   Filed Weights   03/30/20 0828 03/31/20 0418 04/02/20 0353  Weight: 74.8 kg 71.9 kg 71.5 kg    Exam:  General: NAD  Cardiovascular: S1, S2 present  Respiratory:  Diminished breath sounds bilaterally  Abdomen: Soft, nontender, nondistended, bowel sounds present  Musculoskeletal: No bilateral pedal edema noted  Skin: Normal  Psychiatry: Normal mood   Data Reviewed: CBC: Recent Labs  Lab 03/28/20 2120 03/29/20 1501 03/30/20 0341 03/31/20 0639 04/01/20 0640  WBC 10.1 9.5 10.1 15.6* 9.9  NEUTROABS 6.6  --  9.5* 13.7* 5.7  HGB 15.1 15.6 14.7 14.0 14.2  HCT 44.2 45.7 42.7 40.9 41.5  MCV 93.6 92.1 92.8 93.4 91.8  PLT 297 313 270 288 296   Basic Metabolic Panel: Recent Labs  Lab 03/28/20 2120 03/29/20 1501 03/30/20 0341 03/31/20 0639 04/01/20 0640  NA 138 136 135 137 138  K 4.1 3.5 4.2 4.9 3.8  CL 104 99 101 101 100  CO2 24 25 24 26 28   GLUCOSE 98 100* 172* 142* 103*  BUN 7 7 8 10 11   CREATININE 0.87 0.69 0.70 0.79 0.83  CALCIUM 9.2 9.3 8.6* 9.3 9.1  MG  --   --  2.1  --   --   PHOS  --   --  2.7  --   --    GFR: Estimated Creatinine Clearance: 125.6 mL/min (by C-G formula based on SCr of 0.83 mg/dL). Liver Function Tests: Recent Labs  Lab 03/28/20 2120 03/30/20 0341  AST 30 27  ALT 29 25  ALKPHOS 52 49  BILITOT 1.2 1.0  PROT 7.5 7.1  ALBUMIN 4.4 3.8   No results for input(s): LIPASE, AMYLASE in the last 168 hours. No results for input(s): AMMONIA in the last 168 hours. Coagulation Profile: No results for input(s): INR, PROTIME in the last 168 hours. Cardiac Enzymes: No results for input(s): CKTOTAL, CKMB, CKMBINDEX, TROPONINI in the last 168 hours. BNP (last 3 results) No results for input(s): PROBNP in the last 8760 hours. HbA1C: No results for input(s): HGBA1C in the last 72  hours. CBG: No results for input(s): GLUCAP in the last 168 hours. Lipid Profile: No results for input(s): CHOL, HDL, LDLCALC, TRIG, CHOLHDL, LDLDIRECT in the last 72 hours. Thyroid Function Tests: No results for input(s): TSH, T4TOTAL, FREET4, T3FREE, THYROIDAB in the last 72 hours. Anemia Panel: No results for input(s): VITAMINB12, FOLATE, FERRITIN, TIBC, IRON, RETICCTPCT in the last 72 hours. Urine analysis:    Component Value Date/Time   COLORURINE YELLOW (A) 03/30/2020 0414   APPEARANCEUR HAZY (A) 03/30/2020 0414   LABSPEC 1.027 03/30/2020 0414   PHURINE 5.0 03/30/2020 0414   GLUCOSEU NEGATIVE 03/30/2020 0414   HGBUR NEGATIVE 03/30/2020 0414   BILIRUBINUR NEGATIVE 03/30/2020 0414   KETONESUR 20 (A) 03/30/2020 0414   PROTEINUR 30 (A) 03/30/2020 0414   NITRITE NEGATIVE 03/30/2020 0414   LEUKOCYTESUR NEGATIVE 03/30/2020 0414   Sepsis Labs: @LABRCNTIP (procalcitonin:4,lacticidven:4)  )  Recent Results (from the past 240 hour(s))  Respiratory Panel by RT PCR (Flu A&B, Covid) - Nasopharyngeal Swab     Status: None   Collection Time: 03/29/20 11:22 PM   Specimen: Nasopharyngeal Swab  Result Value Ref Range Status   SARS Coronavirus 2 by RT PCR NEGATIVE NEGATIVE Final    Comment: (NOTE) SARS-CoV-2 target nucleic acids are NOT DETECTED.  The SARS-CoV-2 RNA is generally detectable in upper respiratoy specimens during the acute phase of infection. The lowest concentration of SARS-CoV-2 viral copies this assay can detect is 131 copies/mL. A negative result does not preclude SARS-Cov-2 infection and should not be used as the sole basis for treatment or other patient management decisions. A negative result may occur with  improper specimen collection/handling, submission of specimen other than nasopharyngeal swab, presence of viral mutation(s) within the areas targeted by this assay, and inadequate number of viral copies (<131 copies/mL). A negative result must be combined with  clinical observations, patient history, and epidemiological information. The expected result is Negative.  Fact Sheet for Patients:  https://www.moore.com/  Fact Sheet for Healthcare Providers:  https://www.young.biz/  This test is no t yet approved or cleared by the [place] FDA and  has been authorized for detection and/or diagnosis of SARS-CoV-2 by FDA under an Emergency Use Authorization (EUA). This EUA will remain  in effect (meaning this test can be used) for the duration of the COVID-19 declaration under Section 564(b)(1) of the Act, 21 U.S.C. section 360bbb-3(b)(1), unless the authorization is terminated or revoked sooner.     Influenza A by PCR NEGATIVE NEGATIVE Final   Influenza B by PCR NEGATIVE NEGATIVE Final    Comment: (NOTE) The Xpert Xpress SARS-CoV-2/FLU/RSV assay is intended as an aid in  the diagnosis of influenza from Nasopharyngeal swab specimens and  should not be used as a sole basis for treatment. Nasal washings and  aspirates are unacceptable for Xpert Xpress SARS-CoV-2/FLU/RSV  testing.  Fact Sheet for Patients: https://www.moore.com/  Fact Sheet for Healthcare Providers: https://www.young.biz/  This test is not yet approved or cleared by the [place] FDA and  has been authorized for detection and/or diagnosis of SARS-CoV-2 by  FDA under an Emergency Use Authorization (EUA). This EUA will remain  in effect (meaning this test can be used) for the duration of the  Covid-19 declaration under Section 564(b)(1) of the Act, 21  U.S.C. section 360bbb-3(b)(1), unless the authorization is  terminated or revoked. Performed at [facility], [address] [place], [place] [address]   Respiratory Panel by PCR     Status: None   Collection Time: 03/30/20  3:41 AM   Specimen: Nasopharyngeal Swab; Respiratory  Result Value Ref Range Status   Adenovirus NOT  DETECTED NOT DETECTED Final   Coronavirus 229E NOT DETECTED NOT DETECTED Final    Comment: (NOTE) The Coronavirus on the Respiratory Panel, DOES NOT test for the novel  Coronavirus (2019 nCoV)    Coronavirus HKU1 NOT DETECTED NOT DETECTED Final   Coronavirus NL63 NOT DETECTED NOT DETECTED Final   Coronavirus OC43 NOT DETECTED NOT DETECTED Final   Metapneumovirus NOT DETECTED NOT DETECTED Final   Rhinovirus / Enterovirus NOT DETECTED NOT DETECTED Final   Influenza A NOT DETECTED NOT DETECTED Final   Influenza B NOT DETECTED NOT DETECTED Final   Parainfluenza Virus 1 NOT DETECTED NOT DETECTED Final   Parainfluenza Virus 2 NOT DETECTED NOT DETECTED Final   Parainfluenza Virus 3 NOT DETECTED NOT DETECTED Final  Parainfluenza Virus 4 NOT DETECTED NOT DETECTED Final   Respiratory Syncytial Virus NOT DETECTED NOT DETECTED Final   Bordetella pertussis NOT DETECTED NOT DETECTED Final   Chlamydophila pneumoniae NOT DETECTED NOT DETECTED Final   Mycoplasma pneumoniae NOT DETECTED NOT DETECTED Final    Comment: Performed at [facility], [address] [place], [place] [address]  Culture, sputum-assessment     Status: None   Collection Time: 03/30/20  4:14 AM   Specimen: Expectorated Sputum  Result Value Ref Range Status   Specimen Description EXPECTORATED SPUTUM  Final   Special Requests NONE  Final   Sputum evaluation   Final    Sputum specimen not acceptable for testing.  Please recollect.   RESULT CALLED TO, READ BACK BY AND VERIFIED WITH: Alger Simons RN [phone] 03/30/20 HNM Performed at [facility], [address], [place], [place] [address]    Report Status 03/30/2020 FINAL  Final      Studies: No results found.  Scheduled Meds: . arformoterol  15 mcg Nebulization BID  . budesonide (PULMICORT) nebulizer solution  0.5 mg Nebulization BID  . clindamycin  300 mg Oral Q6H  . dextromethorphan-guaiFENesin  1 tablet Oral BID  . enoxaparin (LOVENOX) injection  40 mg  Subcutaneous Q24H  . loratadine  10 mg Oral Daily  . pantoprazole  40 mg Oral Daily  . predniSONE  40 mg Oral Q breakfast  . sodium chloride flush  3 mL Intravenous Q12H    Continuous Infusions: . azithromycin 500 mg (04/02/20 0828)     LOS: 4 days     Briant Cedar, MD [facility]  If 7PM-7AM, please contact night-coverage [email or web address] 04/02/2020, 4:25 PM

## 2020-04-02 NOTE — TOC Progression Note (Addendum)
Transition of Care [facility]) - Progression Note    Patient Details  Name: Shannon Knapp MRN: [id number] Date of Birth: 1985-11-12  Transition of Care [facility] CM/SW Contact  Bing Quarry, RN Phone Number: 04/02/2020, 4:27 PM  Clinical Narrative:   11/14 No PCP, no insurance, may need medication management depending on discharge meds. Ambulated in hallway today on RA, tol. well according to RN notes. Provider notes on 11/13 anticipate dc 2 days. Not yet medically stable. Gabriel Cirri RN CM  11/14 1630 update. Provider message CM patient will need medication help and will work on discharge meds with plan of discharge 11/15. Gabriel Cirri RN CM     Expected Discharge Plan: Home/Self Care Barriers to Discharge: Continued Medical Work up  Expected Discharge Plan and Services Expected Discharge Plan: Home/Self Care       Living arrangements for the past 2 months: Single Family Home                                       Social Determinants of Health (SDOH) Interventions    Readmission Risk Interventions Readmission Risk Prevention Plan 03/31/2020  Transportation Screening Complete  PCP or Specialist Appt within 5-7 Days Not Complete  Not Complete comments To be done at discharge.  Home Care Screening Complete  Medication Review (RN CM) Complete  Some recent data might be hidden

## 2020-04-02 NOTE — Discharge Summary (Addendum)
Discharge Summary  Shannon Knapp:[id number] DOB: 10-22-1985  PCP: Patient, No Pcp Per  Admit date: 03/29/2020 Discharge date: 04/03/2020  Time spent: 40 mins  Recommendations for Outpatient Follow-up:  1. Follow-up with pulmonologist as scheduled 2. Advised to establish care with [name] in 1 week for follow-up   Discharge Diagnoses:  Active Hospital Problems   Diagnosis Date Noted  . 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 Severe persistent asthma 05/28/2018  . Asthma exacerbation     Resolved Hospital Problems  No resolved problems to display.    Discharge Condition: Stable  Diet recommendation: Regular  Vitals:   04/03/20 0749 04/03/20 0806  BP:  128/87  Pulse:  86  Resp:  18  Temp:  98.2 F (36.8 C)  SpO2: 99% 99%    History of present illness:  Shannon Knapp a 34 y.o.malewith medical history significant of chronic asthma steroid dependent/ COPD, presented with worsening SOB, cough and wheezing for the past 2 weeks. Pt has been trying to use his inhalers but hasnt seem to help. Patient's pulmonologist felt that he was overusing his inhaler and developing tachyphylaxis. Pt was seen early in ED and was able to be discharged home, returned to the ED due to worsening symptoms. No fevers/chills/nausea/vomiting/diarrhea. Of note, patient is chronically steroid-dependent. Recently developed dental pain felt to be secondary to tooth infection. Pt was recently started on clindamycin and his prednisone was stopped due to possible infection. In the ED, patient noted to be tachycardic, tachypneic, afebrile, labs and chest x-ray unremarkable, COVID-19 and flu negative. CT maxillofacial showed large carie of the first right mandibular molar, also noted moderate right facial soft tissue swelling located anterior to the right mandible, consistent with odontogenic cellulitis, diffuse paranasal sinus disease with complete opacification of the  ethmoid and frontal sinuses. Admitted for further management. Patient is not vaccinated for Covid- advised to get vaccinated      Today, patient reports feeling much better, very eager to be discharged.  Denies any worsening shortness of breath, chest pain, nausea/vomiting, fever/chills.   Hospital Course:  Active Problems:   Asthma exacerbation   Severe persistent asthma   Odontogenic infection of jaw   Acute exacerbation of severe persistent asthma Hx of ABPA Currently saturating well on RA, weaned of O2 Currently afebrile COVID-19, flu negative Respiratory panel PCR negative VBG no evidence of hypercapnia Chest x-ray unremarkable Discharge on steroid taper, Symbicort, Spiriva, albuterol nebulizer, rescue inhaler, Mucinex DM, montelukast Started patient on p.o. pantoprazole twice daily to help out with any reflux disease worsening his nightly cough Completed azithromycin Pulmonary consulted, needs Epipen in order to start Xolair injections, ordered  Odontogenic cellulitis Currently afebrile CT maxillofacial showed large carie of the first right mandibular molar, also noted moderate right facial soft tissue swelling located anterior to the right mandible, consistent with odontogenic cellulitis, diffuse paranasal sinus disease with complete opacification of the ethmoid and frontal sinuses Continue clindamycin for total of 10 days Outpatient follow-up with dentist  Low TSH Free T4 WNL       Malnutrition Type:      Malnutrition Characteristics:      Nutrition Interventions:      Estimated body mass index is 22.05 kg/m as calculated from the following:   Height as of this encounter: 5\' 11"  (1.803 m).   Weight as of this encounter: 71.7 kg.    Procedures:  None  Consultations:  Pulmonology  Discharge Exam: BP 128/87 (BP Location: Left Arm)  Pulse 86   Temp 98.2 F (36.8 C) (Oral)   Resp 18   Ht  (1.803 m)   Wt 71.7 kg   SpO2 99%    BMI 22.05 kg/m   General: NAD Cardiovascular: S1, S2 present Respiratory: Diminished breath sounds bilaterally    Discharge Instructions You were cared for by a hospitalist during your hospital stay. If you have any questions about your discharge medications or the care you received while you were in the hospital after you are discharged, you can call the unit and asked to speak with the hospitalist on call if the hospitalist that took care of you is not available. Once you are discharged, your primary care physician will handle any further medical issues. Please note that NO REFILLS for any discharge medications will be authorized once you are discharged, as it is imperative that you return to your primary care physician (or establish a relationship with a primary care physician if you do not have one) for your aftercare needs so that they can reassess your need for medications and monitor your lab values.  Discharge Instructions    Diet - low sodium heart healthy   Complete by: As directed    Increase activity slowly   Complete by: As directed      Allergies as of 04/03/2020      Reactions   Amoxicillin Hives   Hives all over the body. [address] [employer]. Has patient had a PCN reaction causing immediate rash, facial/tongue/throat swelling, SOB or lightheadedness with hypotension: Yes Has patient had a PCN reaction causing severe rash involving mucus membranes or skin necrosis: No Has patient had a PCN reaction that required hospitalization: No Has patient had a PCN reaction occurring within the last 10 years: Yes If all of the above answers are "NO", then may proceed with Cephalosporin use.   Doxycycline    Fish Allergy Swelling   Shellfish Allergy Swelling   Penicillins Hives   Has patient had a PCN reaction causing immediate rash, facial/tongue/throat swelling, SOB or lightheadedness with hypotension: Yes Has patient had a PCN reaction causing severe rash involving mucus  membranes or skin necrosis: No Has patient had a PCN reaction that required hospitalization: No Has patient had a PCN reaction occurring within the last 10 years: Yes If all of the above answers are "NO", then may proceed with Cephalosporin use.      Medication List    STOP taking these medications   AirDuo Digihaler 232-14 MCG/ACT Aepb Generic drug: Fluticasone-Salmeterol(sensor)   ARTHRITIS STRENGTH BC POWDER PO   GUAIFENESIN 1200 PO   Itraconazole 200 MG Tabs   omeprazole 40 MG capsule Commonly known as: PRILOSEC     TAKE these medications   albuterol 108 (90 Base) MCG/ACT inhaler Commonly known as: VENTOLIN HFA Inhale 2 puffs into the lungs every 6 (six) hours as needed for shortness of breath. What changed: Another medication with the same name was removed. Continue taking this medication, and follow the directions you see here.   albuterol (2.5 MG/3ML) 0.083% nebulizer solution Commonly known as: PROVENTIL Take 3 mLs (2.5 mg total) by nebulization every 4 (four) hours as needed for wheezing or shortness of breath. What changed: Another medication with the same name was removed. Continue taking this medication, and follow the directions you see here.   budesonide-formoterol 160-4.5 MCG/ACT inhaler Commonly known as: Symbicort Inhale 2 puffs into the lungs 2 (two) times daily.   cetirizine 10 MG tablet Commonly known  as: ZYRTEC Take 1 tablet (10 mg total) by mouth daily.   clindamycin 150 MG capsule Commonly known as: CLEOCIN Take 2 capsules (300 mg total) by mouth daily with lunch for 8 days. What changed: Another medication with the same name was removed. Continue taking this medication, and follow the directions you see here.   dextromethorphan-guaiFENesin 30-600 MG 12hr tablet Commonly known as: MUCINEX DM Take 1 tablet by mouth 2 (two) times daily for 14 days.   EPINEPHrine 0.3 mg/0.3 mL Soaj injection Commonly known as: EPI-PEN Inject 0.3 mg into the  muscle as needed for anaphylaxis.   lidocaine 2 % solution Commonly known as: XYLOCAINE Use as directed 15 mLs in the mouth or throat as needed for mouth pain.   montelukast 10 MG tablet Commonly known as: SINGULAIR Take 1 tablet (10 mg total) by mouth at bedtime.   omalizumab 150 MG/ML prefilled syringe Commonly known as: Xolair Inject 300 mg into the skin every 14 (fourteen) days. Along with 75 mg syringe.   omalizumab 75 MG/0.5ML prefilled syringe Commonly known as: Xolair Inject 75 mg into the skin every 14 (fourteen) days. Along with 150 mg x2 syringe.   pantoprazole 40 MG tablet Commonly known as: PROTONIX Take 1 tablet (40 mg total) by mouth 2 (two) times daily.   predniSONE 10 MG tablet Commonly known as: DELTASONE Take 4 tablets (40 mg total) by mouth daily with breakfast for 2 days, THEN 3 tablets (30 mg total) daily with breakfast for 4 days, THEN 2 tablets (20 mg total) daily with breakfast for 4 days, THEN 1 tablet (10 mg total) daily with breakfast for 14 days. Start taking on: April 02, 2020 What changed:   See the new instructions.  Another medication with the same name was removed. Continue taking this medication, and follow the directions you see here.   Spiriva Respimat 1.25 MCG/ACT Aers Generic drug: Tiotropium Bromide Monohydrate Inhale 2 puffs into the lungs daily.      Allergies  Allergen Reactions  . Amoxicillin Hives    Hives all over the body. [address] [employer]. Has patient had a PCN reaction causing immediate rash, facial/tongue/throat swelling, SOB or lightheadedness with hypotension: Yes Has patient had a PCN reaction causing severe rash involving mucus membranes or skin necrosis: No Has patient had a PCN reaction that required hospitalization: No Has patient had a PCN reaction occurring within the last 10 years: Yes If all of the above answers are "NO", then may proceed with Cephalosporin use.  Marland Kitchen Doxycycline   . Fish Allergy  Swelling  . Shellfish Allergy Swelling  . Penicillins Hives    Has patient had a PCN reaction causing immediate rash, facial/tongue/throat swelling, SOB or lightheadedness with hypotension: Yes Has patient had a PCN reaction causing severe rash involving mucus membranes or skin necrosis: No Has patient had a PCN reaction that required hospitalization: No Has patient had a PCN reaction occurring within the last 10 years: Yes If all of the above answers are "NO", then may proceed with Cephalosporin use.    Follow-up Information    Salena Saner, MD Follow up.   Specialty: Pulmonary Disease Why: Please call office on 04/03/20 for a follow up appointment Contact information: [address] [place] [address] [phone]        [name]. Call.   Specialty: General Practice Why: To establish care with a PCP Contact information: [address] [place] [address]  [phone]                The results of significant diagnostics from this hospitalization (including imaging, microbiology, ancillary and laboratory) are listed below for reference.    Significant Diagnostic Studies: DG Chest 2 View  Result Date: 03/29/2020 CLINICAL DATA:  Shortness of breath EXAM: CHEST - 2 VIEW COMPARISON:  None. FINDINGS: The heart size and mediastinal contours are within normal limits. Both lungs are clear. The visualized skeletal structures are unremarkable. IMPRESSION: No active cardiopulmonary disease. Electronically Signed   By: Jonna Clark M.D.   On: 03/29/2020 15:44   DG Chest 2 View  Result Date: 03/28/2020 CLINICAL DATA:  Shortness of breath EXAM: CHEST - 2 VIEW COMPARISON:  12/21/2019 FINDINGS: The heart size and mediastinal contours are within normal limits. Both lungs are clear. The visualized skeletal structures are unremarkable. IMPRESSION: No active cardiopulmonary disease. Electronically Signed   By: Deatra Robinson M.D.   On: 03/28/2020 19:36   CT Maxillofacial W Contrast  Result Date: 03/28/2020 CLINICAL DATA:  Right lower dental pain.  Headache. EXAM: CT MAXILLOFACIAL WITH CONTRAST TECHNIQUE: Multidetector CT imaging of the maxillofacial structures was performed with intravenous contrast. Multiplanar CT image reconstructions were also generated. CONTRAST:  78mL OMNIPAQUE IOHEXOL 300 MG/ML  SOLN COMPARISON:  None. FINDINGS: Osseous: There is a large carie of the first right mandibular molar with a small periapical lucency at its anterior root. No fracture. Orbits: Negative. No traumatic or inflammatory finding. Sinuses: Diffuse paranasal sinus disease with complete opacification of the ethmoid and frontal sinuses. Soft tissues: Moderate right facial soft tissues swelling, primarily located anterior to the right mandible. No drainable fluid collection. Limited intracranial: No significant or unexpected finding. IMPRESSION: 1. Large carie of the first right mandibular molar with a small periapical lucency at its anterior root. 2. Moderate right facial soft tissues swelling, primarily located anterior to the right mandible, consistent with odontogenic cellulitis. No drainable fluid collection. 3. Diffuse paranasal sinus disease with complete opacification of the ethmoid and frontal sinuses. Electronically Signed   By: Deatra Robinson M.D.   On: 03/28/2020 23:20    Microbiology: Recent Results (from the past 240 hour(s))  Respiratory Panel by RT PCR (Flu A&B, Covid) - Nasopharyngeal Swab     Status: None   Collection Time: 03/29/20 11:22 PM   Specimen: Nasopharyngeal Swab  Result Value Ref Range Status   SARS Coronavirus 2 by RT PCR NEGATIVE NEGATIVE Final    Comment: (NOTE) SARS-CoV-2 target nucleic acids are NOT DETECTED.  The SARS-CoV-2 RNA is generally detectable in upper respiratoy specimens during the acute phase of infection. The lowest concentration of SARS-CoV-2 viral copies this assay can detect  is 131 copies/mL. A negative result does not preclude SARS-Cov-2 infection and should not be used as the sole basis for treatment or other patient management decisions. A negative result may occur with  improper specimen collection/handling, submission of specimen other than nasopharyngeal swab, presence of viral mutation(s) within the areas targeted by this assay, and inadequate number of viral copies (<131 copies/mL). A negative result must be combined with clinical observations, patient history, and epidemiological information. The expected result is Negative.  Fact Sheet for Patients:  https://www.moore.com/  Fact Sheet for Healthcare Providers:  https://www.young.biz/  This test is no t yet approved or cleared by the [place] FDA and  has been authorized for detection and/or diagnosis of SARS-CoV-2 by FDA under an Emergency Use Authorization (EUA). This EUA will remain  in  effect (meaning this test can be used) for the duration of the COVID-19 declaration under Section 564(b)(1) of the Act, 21 U.S.C. section 360bbb-3(b)(1), unless the authorization is terminated or revoked sooner.     Influenza A by PCR NEGATIVE NEGATIVE Final   Influenza B by PCR NEGATIVE NEGATIVE Final    Comment: (NOTE) The Xpert Xpress SARS-CoV-2/FLU/RSV assay is intended as an aid in  the diagnosis of influenza from Nasopharyngeal swab specimens and  should not be used as a sole basis for treatment. Nasal washings and  aspirates are unacceptable for Xpert Xpress SARS-CoV-2/FLU/RSV  testing.  Fact Sheet for Patients: https://www.fda.gov/media/142436/download  Fact Sheet for Healthcare Providers: https://www.young.biz/https://www.fda.gov/media/142435/download  This test is not yet approved or cleared by the [place] FDA and  has been authorized for dhttps://www.moore.com/etection and/or diagnosis of SARS-CoV-2 by  FDA under an Emergency Use Authorization (EUA). This EUA will remain  in effect  (meaning this test can be used) for the duration of the  Covid-19 declaration under Section 564(b)(1) of the Act, 21  U.S.C. section 360bbb-3(b)(1), unless the authorization is  terminated or revoked. Performed at [facility], [address] [place], [place] [address]   Respiratory Panel by PCR     Status: None   Collection Time: 03/30/20  3:41 AM   Specimen: Nasopharyngeal Swab; Respiratory  Result Value Ref Range Status   Adenovirus NOT DETECTED NOT DETECTED Final   Coronavirus 229E NOT DETECTED NOT DETECTED Final    Comment: (NOTE) The Coronavirus on the Respiratory Panel, DOES NOT test for the novel  Coronavirus (2019 nCoV)    Coronavirus HKU1 NOT DETECTED NOT DETECTED Final   Coronavirus NL63 NOT DETECTED NOT DETECTED Final   Coronavirus OC43 NOT DETECTED NOT DETECTED Final   Metapneumovirus NOT DETECTED NOT DETECTED Final   Rhinovirus / Enterovirus NOT DETECTED NOT DETECTED Final   Influenza A NOT DETECTED NOT DETECTED Final   Influenza B NOT DETECTED NOT DETECTED Final   Parainfluenza Virus 1 NOT DETECTED NOT DETECTED Final   Parainfluenza Virus 2 NOT DETECTED NOT DETECTED Final   Parainfluenza Virus 3 NOT DETECTED NOT DETECTED Final   Parainfluenza Virus 4 NOT DETECTED NOT DETECTED Final   Respiratory Syncytial Virus NOT DETECTED NOT DETECTED Final   Bordetella pertussis NOT DETECTED NOT DETECTED Final   Chlamydophila pneumoniae NOT DETECTED NOT DETECTED Final   Mycoplasma pneumoniae NOT DETECTED NOT DETECTED Final    Comment: Performed at [facility], [address] [place], [place] [address]  Culture, sputum-assessment     Status: None   Collection Time: 03/30/20  4:14 AM   Specimen: Expectorated Sputum  Result Value Ref Range Status   Specimen Description EXPECTORATED SPUTUM  Final   Special Requests NONE  Final   Sputum evaluation   Final    Sputum specimen not acceptable for testing.  Please recollect.   RESULT CALLED TO, READ BACK BY AND  VERIFIED WITH: Alger SimonsIGE JOHNSON RN [phone] 03/30/20 HNM Performed at [facility], [address] [place], [place] [address]    Report Status 03/30/2020 FINAL  Final     Labs: Basic Metabolic Panel: Recent Labs  Lab 03/28/20 2120 03/29/20 1501 03/30/20 0341 03/31/20 0639 04/01/20 0640  NA 138 136 135 137 138  K 4.1 3.5 4.2 4.9 3.8  CL 104 99 101 101 100  CO2 24 25 24 26 28   GLUCOSE 98 100* 172* 142* 103*  BUN 7 7 8 10 11   CREATININE 0.87 0.69 0.70  0.79 0.83  CALCIUM 9.2 9.3 8.6* 9.3 9.1  MG  --   --  2.1  --   --   PHOS  --   --  2.7  --   --    Liver Function Tests: Recent Labs  Lab 03/28/20 2120 03/30/20 0341  AST 30 27  ALT 29 25  ALKPHOS 52 49  BILITOT 1.2 1.0  PROT 7.5 7.1  ALBUMIN 4.4 3.8   No results for input(s): LIPASE, AMYLASE in the last 168 hours. No results for input(s): AMMONIA in the last 168 hours. CBC: Recent Labs  Lab 03/28/20 2120 03/29/20 1501 03/30/20 0341 03/31/20 0639 04/01/20 0640  WBC 10.1 9.5 10.1 15.6* 9.9  NEUTROABS 6.6  --  9.5* 13.7* 5.7  HGB 15.1 15.6 14.7 14.0 14.2  HCT 44.2 45.7 42.7 40.9 41.5  MCV 93.6 92.1 92.8 93.4 91.8  PLT 297 313 270 288 296   Cardiac Enzymes: No results for input(s): CKTOTAL, CKMB, CKMBINDEX, TROPONINI in the last 168 hours. BNP: BNP (last 3 results) No results for input(s): BNP in the last 8760 hours.  ProBNP (last 3 results) No results for input(s): PROBNP in the last 8760 hours.  CBG: No results for input(s): GLUCAP in the last 168 hours.     Signed:  Briant Cedar, MD [facility] 04/03/2020, 10:49 AM

## 2020-04-03 LAB — ALPHA-1 ANTITRYPSIN PHENOTYPE: A-1 Antitrypsin, Ser: 189 mg/dL — ABNORMAL HIGH (ref 95–164)

## 2020-04-03 NOTE — Progress Notes (Signed)
Patient to f/u with gonzalez tomorrow.  He had some questions regarding his pulmonary medications which will be addressed tomorrow at his f/u appt.   D/c home with wife.  D/c telemetry and PIV.

## 2020-04-04 ENCOUNTER — Ambulatory Visit (INDEPENDENT_AMBULATORY_CARE_PROVIDER_SITE_OTHER): Payer: Self-pay | Admitting: Pulmonary Disease

## 2020-04-04 ENCOUNTER — Encounter: Payer: Self-pay | Admitting: Pulmonary Disease

## 2020-04-04 ENCOUNTER — Ambulatory Visit: Payer: Self-pay | Admitting: Pulmonary Disease

## 2020-04-04 ENCOUNTER — Other Ambulatory Visit: Payer: Self-pay

## 2020-04-04 VITALS — BP 120/82 | HR 89 | Temp 97.1°F | Ht 71.0 in | Wt 164.6 lb

## 2020-04-04 DIAGNOSIS — B4481 Allergic bronchopulmonary aspergillosis: Secondary | ICD-10-CM

## 2020-04-04 DIAGNOSIS — K047 Periapical abscess without sinus: Secondary | ICD-10-CM

## 2020-04-04 DIAGNOSIS — J4550 Severe persistent asthma, uncomplicated: Secondary | ICD-10-CM

## 2020-04-04 DIAGNOSIS — K219 Gastro-esophageal reflux disease without esophagitis: Secondary | ICD-10-CM

## 2020-04-04 NOTE — Patient Instructions (Addendum)
You have an appointment with the [name] on 22 November at 11:20 AM to establish general care.  Their pharmacy can also help you obtain a lot of your medications.Their phone number is [phone], call to double check to make sure which office you have to be at.  I have sent a prescription for prednisone 10 mg that you can pick up when you finish your other prednisone as well to make sure that you stay on that until we start Xolair.  We continue to try to get assistance for you with the EpiPen.  We will see you in 3 to 4 weeks time with either me or the APP.  Substitute for Protonix (pantoprazole) is Prilosec over-the-counter you can get any pharmacy brand (omeprazole) however you would have to double up on the dose as there is is usually 20 mg and you would need 40 mg.

## 2020-04-04 NOTE — Progress Notes (Signed)
Subjective:    Patient ID: Shannon Knapp, male    DOB: [birth year], 34 y.o.   MRN: [id number]  HPI Is a 34 year old current smoker with severe persistent asthma and a history of ABPA who follows after his recent admission at [facility] for asthma exacerbation in the setting of dental abscess/infection.  Patient was admitted from 10 November through 03 April 2020.  Just released yesterday.  The patient was seen in consultation by me on 30 March 2020.  Please refer to that note for details.  He responded well to antibiotics for management of his dental abscess and facial cellulitis.  He is to make an appointment with the [name] to establish general care.  He has not been able to procure an EpiPen which would help him start Xolair injections which should help his management of asthma.  He has had some issues with gastroesophageal reflux and his prescription of Protonix is running low.  We discussed OTC alternatives.  As he was just discharged yesterday he has not had any new symptomatology since then.  Girlfriend is here with with him he authorizes information sharing.   Review of Systems A 10 point review of systems was performed and it is as noted above otherwise negative.    Patient Active Problem List   Diagnosis Date Noted  . GERD (gastroesophageal reflux disease) 05/03/2020  . 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 Community acquired pneumonia of left lower lobe of lung 10/25/2019  . Medication management 04/30/2019  . Healthcare maintenance 11/26/2018  . Severe persistent asthma 05/28/2018  . Acute febrile illness 04/10/2018  . Upper airway cough syndrome   . Hypoxemia   . Syncope   . Allergic rhinitis   . Cough   . Hemoptysis   . Protein-calorie malnutrition, severe 10/20/2017  . Acute respiratory failure with hypoxemia (HCC) 10/20/2017  . Acute respiratory distress   .  Asthma exacerbation   . Status asthmaticus 10/18/2017  . Severe persistent acute asthmatic bronchitis 10/17/2017  . Elevated blood pressure reading without diagnosis of hypertension 10/17/2017  . Respiratory distress 10/17/2017   Allergies  Allergen Reactions  . Amoxicillin Hives    Hives all over the body. [address] [facility] [address] [employer]. Has patient had a PCN reaction causing immediate rash, facial/tongue/throat swelling, SOB or lightheadedness with hypotension: Yes Has patient had a PCN reaction causing severe rash involving mucus membranes or skin necrosis: No Has patient had a PCN reaction that required hospitalization: No Has patient had a PCN reaction occurring within the last 10 years: Yes If all of the above answers are "NO", then may proceed with Cephalosporin use.  Marland Kitchen Doxycycline   . Fish Allergy Swelling  . Shellfish Allergy Swelling  . Penicillins Hives    Has patient had a PCN reaction causing immediate rash, facial/tongue/throat swelling, SOB or lightheadedness with hypotension: Yes Has patient had a PCN reaction causing severe rash involving mucus membranes or skin necrosis: No Has patient had a PCN reaction that required hospitalization: No Has patient had a PCN reaction occurring within the last 10 years: Yes If all of the above answers are "NO", then may proceed with Cephalosporin use.   Discharge medications were reviewed.   Objective:   Physical Exam BP 120/82 (BP Location: Left Arm, Patient Position: Sitting, Cuff Size: Normal)   Pulse 89   Temp (!) 97.1 F (36.2 C) (Temporal)   Ht 5\' 11"  (1.803 m)  Wt 164 lb 9.6 oz (74.7 kg)   SpO2 99%   BMI 22.96 kg/m  GENERAL: Well-developed, well-nourished gentleman in no acute distress.  No conversational dyspnea.  HEAD: Normocephalic, atraumatic.  There is mild right facial swelling. EYES: Pupils equal, round, reactive to light.  No scleral icterus.  MOUTH:  Nose/mouth/throat not examined due to masking requirements for  COVID 19. NECK: Supple. No thyromegaly. Trachea midline. No JVD.  No adenopathy. PULMONARY: Symmetrical air entry, no adventitious sounds. CARDIOVASCULAR: S1 and S2.    Regular rate and rhythm.  No rubs, murmurs or gallops heard. ABDOMEN: Benign. MUSCULOSKELETAL: No joint deformity, no clubbing, no edema.  NEUROLOGIC: No overt focal deficit. SKIN: Intact,warm,dry. PSYCH: Mood and behavior normal.      Assessment & Plan:     ICD-10-CM   1. Severe persistent asthma without complication  J45.50    Recent exacerbation, appears to be well controlled currently After prednisone taper continue prednisone 10 mg daily Needs EpiPen to start Xolair  2. ABPA (allergic bronchopulmonary aspergillosis) (HCC)  B44.81    Continue prednisone 10 mg daily Has already completed itraconazole for 16 weeks Needs to start Xolair  3. Dental abscess  K04.7    This triggered facial cellulitis as well Triggered asthma exacerbation Complete antibiotics per discharge instructions Needs dental eval  4. Gastroesophageal reflux disease, unspecified whether esophagitis present  K21.9    Omeprazole OTC Antireflux measures   Discussion:   The patient appears to have resolved his exacerbation.  Continue prednisone 10 mg daily as prior once he completes his taper.  He needs dental evaluation and primary care he has been referred to the [facility] to procure this type of care.  We will see him in follow-up in 3 to 4 weeks time he is to contact us prior to that time should any new problems arise.  We filled medication assistance paperwork for his EpiPen.  He cannot get Xolair unless he has this medication on hand.  Gailen Shelter, MD [facility]   *This note was dictated using voice recognition software/Dragon.  Despite best efforts to proofread, errors can occur which can change the meaning.  Any change was purely unintentional.

## 2020-04-12 ENCOUNTER — Ambulatory Visit: Payer: Medicaid Other | Admitting: Pharmacy Technician

## 2020-04-12 ENCOUNTER — Other Ambulatory Visit: Payer: Self-pay

## 2020-04-12 DIAGNOSIS — Z79899 Other long term (current) drug therapy: Secondary | ICD-10-CM

## 2020-04-12 NOTE — Progress Notes (Signed)
Completed financial assistance application for [facility] due to recent hospital visit. Forwarding to appropriate department in [facility].    Completed [facility] application and contract.  Patient agreed to all terms of the [facility] contract.    Patient approved to receive medication assistance at [facility] until time for re-certification in 7373, and as long as eligibility criteria continues to be met.    Provided patient with Civil engineer, contracting based on his particular needs.    [facility]

## 2020-04-25 ENCOUNTER — Encounter: Payer: Self-pay | Admitting: Primary Care

## 2020-04-25 ENCOUNTER — Ambulatory Visit (INDEPENDENT_AMBULATORY_CARE_PROVIDER_SITE_OTHER): Payer: Self-pay | Admitting: Primary Care

## 2020-04-25 ENCOUNTER — Other Ambulatory Visit: Payer: Self-pay | Admitting: Primary Care

## 2020-04-25 ENCOUNTER — Other Ambulatory Visit: Payer: Self-pay

## 2020-04-25 DIAGNOSIS — J4550 Severe persistent asthma, uncomplicated: Secondary | ICD-10-CM

## 2020-04-25 DIAGNOSIS — K219 Gastro-esophageal reflux disease without esophagitis: Secondary | ICD-10-CM

## 2020-04-25 MED ORDER — MONTELUKAST SODIUM 10 MG PO TABS: 10.0000 mg | ORAL_TABLET | Freq: Every day | ORAL | 2 refills | Status: DC

## 2020-04-25 MED ORDER — PREDNISONE 10 MG PO TABS: 10.0000 mg | ORAL_TABLET | Freq: Every day | ORAL | 2 refills | Status: DC

## 2020-04-25 MED ORDER — OMEPRAZOLE 40 MG PO CPDR: 40.0000 mg | DELAYED_RELEASE_CAPSULE | Freq: Every day | ORAL | 2 refills | Status: DC

## 2020-04-25 NOTE — Patient Instructions (Addendum)
   Continue Dulera 200 two puffs twice daily until you get patient assistance for Symbicort   Albuterol (Ventolin) OR albuterol nebulizer every 4-6 hours for breakthrough shortness of breath/wheezing   Continue Prednisone 10mg  daily until we can get you on Xolair injections   I sent in prescription for omeprazole 40mg  (this would replace the over the counter)  Please complete paper work for and Symbicort patient assistance and bring to community health and wellness   Follow-up: Please call our office once you get your Epipen to schedule apt for first Xolair injection   Office visit in 6-8 weeks with Dr.     Food Choices for Gastroesophageal Reflux Disease, Adult When you have gastroesophageal reflux disease (GERD), the foods you eat and your eating habits are very important. Choosing the right foods can help ease your discomfort. Think about working with a nutrition specialist (dietitian) to help you make good choices. What are tips for following this plan?  Meals  Choose healthy foods that are low in fat, such as fruits, vegetables, whole grains, low-fat dairy products, and lean meat, fish, and poultry.  Eat small meals often instead of 3 large meals a day. Eat your meals slowly, and in a place where you are relaxed. Avoid bending over or lying down until 2-3 hours after eating.  Avoid eating meals 2-3 hours before bed.  Avoid drinking a lot of liquid with meals.  Cook foods using methods other than frying. Bake, grill, or broil food instead.  Avoid or limit: ? Chocolate. ? Peppermint or spearmint. ? Alcohol. ? Pepper. ? Black and decaffeinated coffee. ? Black and decaffeinated tea. ? Bubbly (carbonated) soft drinks. ? Caffeinated energy drinks and soft drinks.  Limit high-fat foods such as: ? Fatty meat or fried foods. ? Whole milk, cream, butter, or ice cream. ? Nuts and nut butters. ? Pastries, donuts, and sweets made with butter or  shortening.  Avoid foods that cause symptoms. These foods may be different for everyone. Common foods that cause symptoms include: ? Tomatoes. ? Oranges, lemons, and limes. ? Peppers. ? Spicy food. ? Onions and garlic. ? Vinegar. Lifestyle  Maintain a healthy weight. Ask your doctor what weight is healthy for you. If you need to lose weight, work with your doctor to do so safely.  Exercise for at least 30 minutes for 5 or more days each week, or as told by your doctor.  Wear loose-fitting clothes.  Do not smoke. If you need help quitting, ask your doctor.  Sleep with the head of your bed higher than your feet. Use a wedge under the mattress or blocks under the bed frame to raise the head of the bed. Summary  When you have gastroesophageal reflux disease (GERD), food and lifestyle choices are very important in easing your symptoms.  Eat small meals often instead of 3 large meals a day. Eat your meals slowly, and in a place where you are relaxed.  Limit high-fat foods such as fatty meat or fried foods.  Avoid bending over or lying down until 2-3 hours after eating.  Avoid peppermint and spearmint, caffeine, alcohol, and chocolate. This information is not intended to replace advice given to you by your health care provider. Make sure you discuss any questions you have with your health care provider. Document Revised: 08/27/2018 Document Reviewed: 06/11/2016 Elsevier Patient Education  2020 10/27/2018.

## 2020-04-25 NOTE — Progress Notes (Signed)
@Patient  ID: , male    DOB: 1985/06/11, 34 y.o.   MRN: [id number]  Chief Complaint  Patient presents with  . Follow-up    c/o prod ocugh mainly at night--cough is prod with yellow-greenish mucus.     Referring provider: No ref. provider found  HPI: 34 year old male, former smoker (No vaping or marijuana). PMH significant for severe persistent asthma steroid dependent, ABPA, allergic rhinitis. Patient of Dr. 31, last seen 04/04/20. Elevated IgE and Eosinophils found to have ABPA. Maintained on Symbicort 160, Singulair, prednisone 10mg  daily. Xolair on hold, patient unable to afford Epipen.   Previous [facility] pulmonary encounter: 01/07/2020 Follow up : Asthma , ABPA Patient presents for a 6-week follow-up.  Patient has severe persistent asthma, allergic rhinitis and ABPA.  Patient has had difficult control asthma.  Patient continued to have recurrent flares.  As above work-up revealed a very high eosinophil count and IgE Aspergillus positive.  ANCA was negative.  IgE was 3840.  Patient was started on prednisone therapy.  Started on itraconazole for 16 weeks.  He is currently on prednisone 10 mg daily.  Patient has been recommended to begin Xolair.  He is currently on Symbicort twice daily.  Since last visit patient is feeling Chest x-ray December 21, 2019 showed complete resolution of left basilar pneumonia. Labs on December 21, 2019 showed normal LFTs.  CBC showed normal white blood cell count.  Eosinophils absolute count 600.  Says he is feeling a lot of better. Able to work some. Decreased cough and wheezing .  Outside heat causes wheezing on occasion.   02/18/2020  Patient presents today for acute visit. Hx severe persistent asthma. Reports increased shortness of breath, wheezing and cough x 4 days. Ran out of Symbicort Monday September 27. Using Albuterol rescue inhaler 6-8 times a day. Continue Singulair and prendisone 10mg  daily. He has not been started on Xolair injections  because he can not afford epipen, which would cost him >400 dollars. States that he is outdoors a lot which triggers his asthma symptoms. 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 He does wear a mask.  He is on Medicaid family planning, he does not recall signing up for this particular insurance plan.     04/25/2020 - interim hx  Patient presents today for 2 month follow-up. He is doing well today. No recent exacerbations. He was admitted to the hospital in November 2021 for 5 days for asthma exacerbation. He followed up with Dr. on 04/04/20. He was instructed to stay on prednisone 10mg  daily until he can start Xolair which has been on hold for several months because he can not afford Epipen. We have provided him with patient assistance paperwork multiple times. He has since established with community health and wellness. He had an apt on November 22nd with [name].   He has been using Dulera 200 as substitute for Symbicort until he gets PA. He remains on prednisone 10mg  daily, needs refill. He has patient assistance paperwork with him today for Epipen and Symbicort. He gets reflux at night which causes cough and shortness of breath symptoms.    TEST/EVENTS :  11/03/19 relative eosinophil count of 52% and absolute eosinophils of 5.7.   RAST profile was positive across the board,   IgE directed to Aspergillus fumigatus Aspergillus close versicolor.   ANCA panel was negative, total IgE was 3840  Eosinophil count after prednisone therapy: Relative  eosinophils 10% and absolute eosinophils 0.5.  Imaging: CT maxillofacial showed large carie of the first right mandibular molar, also noted moderate right facial soft tissue swelling located anterior to the right mandible, consistent with odontogenic cellulitis, diffuse paranasal sinus disease with complete opacification of the ethmoid and frontal sinuses  Allergies  Allergen  Reactions  . Amoxicillin Hives    Hives all over the body. [address] [address] [employer]. Has patient had a PCN reaction causing immediate rash, facial/tongue/throat swelling, SOB or lightheadedness with hypotension: Yes Has patient had a PCN reaction causing severe rash involving mucus membranes or skin necrosis: No Has patient had a PCN reaction that required hospitalization: No Has patient had a PCN reaction occurring within the last 10 years: Yes If all of the above answers are "NO", then may proceed with Cephalosporin use.  Marland Kitchen. Doxycycline   . Fish Allergy Swelling  . Shellfish Allergy Swelling  . Penicillins Hives    Has patient had a PCN reaction causing immediate rash, facial/tongue/throat swelling, SOB or lightheadedness with hypotension: Yes Has patient had a PCN reaction causing severe rash involving mucus membranes or skin necrosis: No Has patient had a PCN reaction that required hospitalization: No Has patient had a PCN reaction occurring within the last 10 years: Yes If all of the above answers are "NO", then may proceed with Cephalosporin use.    Immunization History  Administered Date(s) Administered  . Pneumococcal Polysaccharide-23 10/19/2017    Past Medical History:  Diagnosis Date  . Allergic bronchopulmonary aspergillosis (HCC)   . Asthma   . Chronic bronchitis (HCC)     Tobacco History: Social History   Tobacco Use  Smoking Status Former Smoker  . Packs/day: 0.25  . Years: 14.00  . Pack years: 3.50  . Types: Cigarettes  . Start date: 2006  . Quit date: 08/2018  . Years since quitting: 1.7  Smokeless Tobacco Never Used   Counseling given: Not Answered   Outpatient Medications Prior to Visit  Medication Sig Dispense Refill  . albuterol (PROVENTIL) (2.5 MG/3ML) 0.083% nebulizer solution Take 3 mLs (2.5 mg total) by nebulization every 4 (four) hours as needed for wheezing or shortness of breath. 75 mL 2  . albuterol (VENTOLIN HFA) 108 (90 Base) MCG/ACT  inhaler Inhale 2 puffs into the lungs every 6 (six) hours as needed for shortness of breath. 6.7 g 3  . budesonide-formoterol (SYMBICORT) 160-4.5 MCG/ACT inhaler Inhale 2 puffs into the lungs 2 (two) times daily. 1 each 5  . cetirizine (ZYRTEC) 10 MG tablet Take 1 tablet (10 mg total) by mouth daily. 30 tablet 0  . lidocaine (XYLOCAINE) 2 % solution Use as directed 15 mLs in the mouth or throat as needed for mouth pain. 100 mL 0  . Tiotropium Bromide Monohydrate (SPIRIVA RESPIMAT) 1.25 MCG/ACT AERS Inhale 2 puffs into the lungs daily. 4 g 0  . pantoprazole (PROTONIX) 40 MG tablet Take 1 tablet (40 mg total) by mouth 2 (two) times daily. 60 tablet 0  . predniSONE (DELTASONE) 10 MG tablet Take 4 tablets (40 mg total) by mouth daily with breakfast for 2 days, THEN 3 tablets (30 mg total) daily with breakfast for 4 days, THEN 2 tablets (20 mg total) daily with breakfast for 4 days, THEN 1 tablet (10 mg total) daily with breakfast for 14 days. 42 tablet 0  . EPINEPHrine 0.3 mg/0.3 mL IJ SOAJ injection Inject 0.3 mg into the muscle as needed for anaphylaxis. (Patient not taking: Reported on 04/25/2020) 1 each  0  . omalizumab (XOLAIR) 150 MG/ML prefilled syringe Inject 300 mg into the skin every 14 (fourteen) days. Along with 75 mg syringe. (Patient not taking: Reported on 04/25/2020) 4 mL 2  . omalizumab (XOLAIR) 75 MG/0.5ML prefilled syringe Inject 75 mg into the skin every 14 (fourteen) days. Along with 150 mg x2 syringe. (Patient not taking: Reported on 04/25/2020) 1 mL 2  . montelukast (SINGULAIR) 10 MG tablet Take 1 tablet (10 mg total) by mouth at bedtime. (Patient not taking: Reported on 04/25/2020) 30 tablet 5   No facility-administered medications prior to visit.   Review of Systems  Review of Systems  Constitutional: Negative.   Respiratory: Negative for cough, chest tightness, shortness of breath and wheezing.    Physical Exam  BP 124/70 (BP Location: Left Arm, Cuff Size: Normal)   Pulse 85    Temp 97.6 F (36.4 C) (Temporal)   Ht 5\' 11"  (1.803 m)   Wt 171 lb (77.6 kg)   SpO2 100%   BMI 23.85 kg/m  Physical Exam Constitutional:      Appearance: Normal appearance.  Cardiovascular:     Rate and Rhythm: Normal rate and regular rhythm.  Pulmonary:     Effort: Pulmonary effort is normal.     Breath sounds: Normal breath sounds.     Comments: CTA Skin:    General: Skin is warm and dry.  Neurological:     General: No focal deficit present.     Mental Status: He is alert and oriented to person, place, and time. Mental status is at baseline.  Psychiatric:        Mood and Affect: Mood normal.        Behavior: Behavior normal.        Thought Content: Thought content normal.        Judgment: Judgment normal.      Lab Results:  CBC    Component Value Date/Time   WBC 9.9 04/01/2020 0640   RBC 4.52 04/01/2020 0640   HGB 14.2 04/01/2020 0640   HCT 41.5 04/01/2020 0640   PLT 296 04/01/2020 0640   MCV 91.8 04/01/2020 0640   MCH 31.4 04/01/2020 0640   MCHC 34.2 04/01/2020 0640   RDW 12.5 04/01/2020 0640   LYMPHSABS 3.1 04/01/2020 0640   MONOABS 0.6 04/01/2020 0640   EOSABS 0.3 04/01/2020 0640   BASOSABS 0.0 04/01/2020 0640    BMET    Component Value Date/Time   NA 138 04/01/2020 0640   K 3.8 04/01/2020 0640   CL 100 04/01/2020 0640   CO2 28 04/01/2020 0640   GLUCOSE 103 (H) 04/01/2020 0640   BUN 11 04/01/2020 0640   CREATININE 0.83 04/01/2020 0640   CALCIUM 9.1 04/01/2020 0640   GFRNONAA >60 04/01/2020 0640   GFRAA >60 12/21/2019 0849    BNP No results found for: BNP  ProBNP No results found for: PROBNP  Imaging: No results found.   Assessment & Plan:   Severe persistent asthma Stable interval; Last exacerbation was in November requiring 5 day hospitalization. In the process of starting Xolair injections, he is just waiting for patient assistance of Epipen. He is currently using samples of Dulera 200 until he gets Symbicort 160. He has PA  paperwork with him today for Epipen and Symbicort that he will be dropping off at [facility] wellness.  Advised patient not to stop ICS/LABA inhaler or oral prednisone unless directed to.   Plan: - Continue Dulera 200 two puffs twice daily until  you get patient assistance for Symbicort 160 - Continue Albuterol hfa/nebulizer every 4-6 hours for breakthrough shortness of breath/wheezing  - Continue Prednisone 10mg  daily until we can get you on Xolair injections  - Instructed patient to call our office once he gets Epipen to schedule apt for first Xolair injection   - FU in Office visit in 6-8 weeks with Dr.    GERD (gastroesophageal reflux disease) - RX omeprazole 40mg  daily  - Patient education on GERD diet given    [name], NP 05/03/2020

## 2020-04-26 ENCOUNTER — Other Ambulatory Visit: Payer: Self-pay | Admitting: Family Medicine

## 2020-04-28 ENCOUNTER — Telehealth: Payer: Self-pay | Admitting: Pharmacist

## 2020-04-28 NOTE — Telephone Encounter (Signed)
04/28/2020 2:25:37 PM - Faxed [employer] app. for Epipen  -- Rhetta Mura - Friday, April 28, 2020 2:24 PM --Faxed [employer] application for Epipen.  04/28/2020 2:24:45 PM - [place] application faxed for Symbicort  -- Rhetta Mura - Friday, April 28, 2020 2:23 PM --Faxed [place] application [place] Symbicort 160/4.5 Inhale 2 puffs two times a day.

## 2020-05-03 ENCOUNTER — Encounter: Payer: Self-pay | Admitting: Primary Care

## 2020-05-03 DIAGNOSIS — K219 Gastro-esophageal reflux disease without esophagitis: Secondary | ICD-10-CM | POA: Insufficient documentation

## 2020-05-03 NOTE — Assessment & Plan Note (Addendum)
-   RX omeprazole 40mg  daily  - Patient education on GERD diet given

## 2020-05-03 NOTE — Assessment & Plan Note (Addendum)
Stable interval; Last exacerbation was in November requiring 5 day hospitalization. In the process of starting Xolair injections, he is just waiting for patient assistance of Epipen. He is currently using samples of Dulera 200 until he gets Symbicort 160. He has PA paperwork with him today for Epipen and Symbicort that he will be dropping off at community health and wellness.  Advised patient not to stop ICS/LABA inhaler or oral prednisone unless directed to.   Plan: - Continue Dulera 200 two puffs twice daily until you get patient assistance for Symbicort 160 - Continue Albuterol hfa/nebulizer every 4-6 hours for breakthrough shortness of breath/wheezing  - Continue Prednisone 10mg  daily until we can get you on Xolair injections  - Instructed patient to call our office once he gets Epipen to schedule apt for first Xolair injection   - FU in Office visit in 6-8 weeks with Dr. 

## 2020-05-04 NOTE — Progress Notes (Signed)
Agree with the details of the visit as noted by Elizabeth Walsh, NP.  C. Laura Ronan Duecker, MD [facility] 

## 2020-05-08 ENCOUNTER — Telehealth: Payer: Self-pay | Admitting: Pulmonary Disease

## 2020-05-09 ENCOUNTER — Other Ambulatory Visit: Payer: Self-pay | Admitting: Pulmonary Disease

## 2020-05-09 MED ORDER — SPIRIVA RESPIMAT 1.25 MCG/ACT IN AERS: 2.0000 | INHALATION_SPRAY | Freq: Every day | RESPIRATORY_TRACT | 5 refills | Status: DC

## 2020-05-09 MED ORDER — SPIRIVA RESPIMAT 1.25 MCG/ACT IN AERS: 2.0000 | INHALATION_SPRAY | Freq: Every day | RESPIRATORY_TRACT | 0 refills | Status: DC

## 2020-05-09 MED ORDER — BUDESONIDE-FORMOTEROL FUMARATE 160-4.5 MCG/ACT IN AERO: 2.0000 | INHALATION_SPRAY | Freq: Two times a day (BID) | RESPIRATORY_TRACT | 5 refills | Status: DC

## 2020-05-09 NOTE — Telephone Encounter (Signed)
Pt calling back to see if rx can be called in before med management closes today at 4:30.

## 2020-05-09 NOTE — Telephone Encounter (Signed)
Called and spoke with patient to verify meds he needs refilled and verify pharmacy. He confirmed both but states that the clinic/pharmacy he uses does not have Spiriva on hand and they would have to get it and it would take up to a month or so. Called Medication Mgmt to confirm and see what they needed from [name]. They confirmed what patient said and that they would start the process to get his medication. RX for Spiriva and Symbicort have been sent over. Called and let patient know what they said and told him that I would leave samples up front for him to come and pick up. Patient expressed understanding. Nothing further needed at this time.

## 2020-05-24 ENCOUNTER — Telehealth: Payer: Self-pay | Admitting: Pulmonary Disease

## 2020-05-24 NOTE — Telephone Encounter (Signed)
Received epipen assistance forms from medication management.  Forms have been signed by Dr. [name] and mailed back via interoffice.

## 2020-06-06 ENCOUNTER — Ambulatory Visit: Payer: Medicaid Other | Admitting: Pharmacist

## 2020-06-06 ENCOUNTER — Ambulatory Visit (INDEPENDENT_AMBULATORY_CARE_PROVIDER_SITE_OTHER): Payer: Self-pay | Admitting: Pulmonary Disease

## 2020-06-06 ENCOUNTER — Encounter: Payer: Self-pay | Admitting: Pulmonary Disease

## 2020-06-06 ENCOUNTER — Other Ambulatory Visit: Payer: Self-pay

## 2020-06-06 VITALS — BP 126/72 | HR 100 | Temp 97.7°F | Ht 71.0 in | Wt 169.4 lb

## 2020-06-06 DIAGNOSIS — Z79899 Other long term (current) drug therapy: Secondary | ICD-10-CM

## 2020-06-06 DIAGNOSIS — B4481 Allergic bronchopulmonary aspergillosis: Secondary | ICD-10-CM

## 2020-06-06 DIAGNOSIS — J3089 Other allergic rhinitis: Secondary | ICD-10-CM

## 2020-06-06 DIAGNOSIS — J4550 Severe persistent asthma, uncomplicated: Secondary | ICD-10-CM

## 2020-06-06 DIAGNOSIS — K219 Gastro-esophageal reflux disease without esophagitis: Secondary | ICD-10-CM

## 2020-06-06 MED ORDER — SPIRIVA RESPIMAT 2.5 MCG/ACT IN AERS: 2.0000 | INHALATION_SPRAY | Freq: Every day | RESPIRATORY_TRACT | 0 refills | Status: DC

## 2020-06-06 NOTE — Patient Instructions (Signed)
We are going to Spiriva to 2.5 mcg, 2 puffs once a day use it on your evening dose after your Symbicort  We are providing you a spacer for use with the Symbicort this will help the medication get deeper into your lungs  Continue prednisone at 15 mg for now until seen again in the office  We will see you in follow-up in 3 to 4 weeks time call sooner should any new problems arise

## 2020-06-06 NOTE — Progress Notes (Signed)
Subjective:    Patient ID: Shannon Knapp, male    DOB: 04-03-1986, 35 y.o.   MRN: [id number]  HPI 35 year old male, former smoker (No vaping or marijuana). PMH significant for severe persistent asthma steroid dependent, ABPA, allergic rhinitis.  Last seen  05/03/20 by Buelah Manis, NP.  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 Maintained on Symbicort 160, Singulair, prednisone 10mg  daily. Xolair on hold, patient unable to afford Epipen.   He received 16 weeks of itraconazole for his ABPA.  Still having issues getting EpiPen so that Xolair can be started.  Continues to have some nocturnal awakenings due to shortness of breath.  Persistent issues with cough productive mostly of yellowish sputum.  No fevers chills or sweats.  Not clear if he is doing any allergen avoidance.   Review of Systems  A 10 point review of systems was performed and it is as noted above otherwise negative.  Patient Active Problem List   Diagnosis Date Noted  . Weight loss   . Rhinovirus infection   . Nausea vomiting and diarrhea   . Asthma in adult, severe persistent, with acute exacerbation 08/29/2020  . Viral gastroenteritis 08/29/2020  . Acute asthma exacerbation 07/07/2020  . COVID-19 virus infection 07/07/2020  . Allergic reaction 07/07/2020  . Anaphylaxis after allergen immunotherapy 07/07/2020  . GERD (gastroesophageal reflux disease) 05/03/2020  . 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 Community acquired pneumonia of left lower lobe of lung 10/25/2019  . Medication management 04/30/2019  . Healthcare maintenance 11/26/2018  . Severe persistent asthma 05/28/2018  . Acute febrile illness 04/10/2018  . Upper airway cough syndrome   . Hypoxemia   . Syncope   . Allergic rhinitis   . Cough   . Hemoptysis   . Protein-calorie malnutrition, severe 10/20/2017  . Acute respiratory failure with hypoxemia (HCC)  10/20/2017  . Acute respiratory distress   . Asthma exacerbation   . Status asthmaticus 10/18/2017  . Severe persistent acute asthmatic bronchitis 10/17/2017  . Elevated blood pressure reading without diagnosis of hypertension 10/17/2017  . Respiratory distress 10/17/2017   Allergies  Allergen Reactions  . Amoxicillin Hives    Hives all over the body. 2009 [facility]. Has patient had a PCN reaction causing immediate rash, facial/tongue/throat swelling, SOB or lightheadedness with hypotension: Yes Has patient had a PCN reaction causing severe rash involving mucus membranes or skin necrosis: No Has patient had a PCN reaction that required hospitalization: No Has patient had a PCN reaction occurring within the last 10 years: Yes If all of the above answers are "NO", then may proceed with Cephalosporin use.          [employer] Doxycycline   . Fish Allergy Swelling  . Shellfish Allergy Swelling  . Penicillins Hives    Has patient had a PCN reaction causing immediate rash, facial/tongue/throat swelling, SOB or lightheadedness with hypotension: Yes Has patient had a PCN reaction causing severe rash involving mucus membranes or skin necrosis: No Has patient had a PCN reaction that required hospitalization: No Has patient had a PCN reaction occurring within the last 10 years: Yes If all of the above answers are "NO", then may proceed with Cephalosporin use.   Current Meds  Medication Sig  . [DISCONTINUED] albuterol (PROVENTIL) (2.5 MG/3ML) 0.083% nebulizer solution Take 3 mLs (2.5 mg total) by nebulization every 4 (four) hours as needed for wheezing or shortness of breath.  . [  DISCONTINUED] albuterol (VENTOLIN HFA) 108 (90 Base) MCG/ACT inhaler Inhale 2 puffs into the lungs every 6 (six) hours as needed for shortness of breath.  . [DISCONTINUED] budesonide-formoterol (SYMBICORT) 160-4.5 MCG/ACT inhaler Inhale 2 puffs into the lungs 2 (two) times daily.  . [DISCONTINUED] EPINEPHrine 0.3  mg/0.3 mL IJ SOAJ injection Inject 0.3 mg into the muscle as needed for anaphylaxis.  . [DISCONTINUED] fluticasone (VERAMYST) 27.5 MCG/SPRAY nasal spray Place 2 sprays into the nose daily.  . [DISCONTINUED] montelukast (SINGULAIR) 10 MG tablet Take 1 tablet (10 mg total) by mouth at bedtime.  . [DISCONTINUED] omeprazole (PRILOSEC) 40 MG capsule Take 1 capsule (40 mg total) by mouth daily.  . [DISCONTINUED] predniSONE (DELTASONE) 10 MG tablet Take 1 tablet (10 mg total) by mouth daily with breakfast.  . [DISCONTINUED] Tiotropium Bromide Monohydrate (SPIRIVA RESPIMAT) 1.25 MCG/ACT AERS Inhale 2 puffs into the lungs daily. (Patient taking differently: Inhale 2 puffs into the lungs daily. Dr. [name] says to do 1 puff BID - this his how pt taking it)  . [DISCONTINUED] Tiotropium Bromide Monohydrate (SPIRIVA RESPIMAT) 2.5 MCG/ACT AERS Inhale 2 puffs into the lungs daily for 1 day.   Social History   Tobacco Use  . Smoking status: Former Smoker    Packs/day: 1.00    Years: 14.00    Pack years: 14.00    Types: Cigarettes    Start date: 2006    Quit date: 08/2018    Years since quitting: 2.0  . Smokeless tobacco: Never Used  Substance Use Topics  . Alcohol use: Not Currently    Alcohol/week: 15.0 standard drinks    Types: 15 Cans of beer per week       Objective:   Physical Exam BP 126/72 (BP Location: Left Arm, Cuff Size: Normal)   Pulse 100   Temp 97.7 F (36.5 C) (Temporal)   Ht 5\' 11"  (1.803 m)   Wt 169 lb 6.4 oz (76.8 kg)   SpO2 99%   BMI 23.63 kg/m   GENERAL:Well-developed, well-nourished gentleman in no acute distress.  No conversational dyspnea. HEAD: Normocephalic, atraumatic.There is mild right facial swelling. EYES: Pupils equal, round, reactive to light. No scleral icterus.  MOUTH: Nose/mouth/throat not examined due to masking requirements for COVID 19. NECK: Supple. No thyromegaly. Trachea midline. No JVD. No adenopathy. PULMONARY:Symmetrical air entry, no  adventitious sounds.  No prolonged expiratory phase. CARDIOVASCULAR: S1 and S2.  Regular rate and rhythm.No rubs, murmurs or gallops heard. ABDOMEN:Benign. MUSCULOSKELETAL: No joint deformity, no clubbing, no edema.  NEUROLOGIC:No overt focal deficit. SKIN: Intact,warm,dry. PSYCH: Mood and behavior normal.      Assessment & Plan:     ICD-10-CM   1. Severe persistent asthma, unspecified whether complicated  J45.50    Continue Singulair, Symbicort, Spiriva and as needed albuterol Still having issues procuring EpiPen Will need to start Xolair therapy  2. ABPA (allergic bronchopulmonary aspergillosis) (HCC)  B44.81    Continue maintenance prednisone 15 mg daily Awaiting initiation of Xolair therapy Completed 16 weeks of itraconazole  3. Gastroesophageal reflux disease, unspecified whether esophagitis present  K21.9    Continue omeprazole Antireflux measures  4. Perennial allergic rhinitis  J30.89    Continue fluticasone nasal and nasal hygiene   Meds ordered this encounter  Medications  . Tiotropium Bromide Monohydrate (SPIRIVA RESPIMAT) 2.5 MCG/ACT AERS    Sig: Inhale 2 puffs into the lungs daily for 1 day.    Dispense:  4 g    Refill:  0    Order Specific  Question:   Lot Number?    Answer:   [id number]    Order Specific Question:   Expiration Date?    Answer:   03/20/2021    Order Specific Question:   Quantity    Answer:   3   Patient is to continue medications as they are we have increased the strength of Spiriva slightly to see if this will afford him better control of his asthma.  We are awaiting initiation of Xolair therapy.  Follow-up in 3 to 4 weeks time he is to contact us prior to that time should any new difficulties arise.  Gailen Shelter, MD [facility]   *This note was dictated using voice recognition software/Dragon.  Despite best efforts to proofread, errors can occur which can change the meaning.  Any change was purely unintentional.

## 2020-06-06 NOTE — Progress Notes (Signed)
Medication Management Clinic Visit Note  Patient: Shannon Knapp MRN: [id number] Date of Birth: 1986-05-04 PCP: Patient, No Pcp Per   Jeralyn Bennett 35 y.o. male presents for a telephone visit for medication management today. Verified patient with two identifiers. PMH for severe persistent asthma - steroid dependent, ABPA, and allergic rhinitis.   There were no vitals taken for this visit.  Patient Information   Past Medical History:  Diagnosis Date  . Allergic bronchopulmonary aspergillosis (HCC)   . Asthma   . Chronic bronchitis (HCC)       Past Surgical History:  Procedure Laterality Date  . VIDEO BRONCHOSCOPY Bilateral 10/24/2017   Procedure: VIDEO BRONCHOSCOPY WITHOUT FLUORO;  Surgeon: Alyson Reedy, MD;  Location: [facility] ENDOSCOPY;  Service: Cardiopulmonary;  Laterality: Bilateral;     Family History  Problem Relation Age of Onset  . Hypertension Mother   . Diabetes Mother   . Asthma Father   . Hypertension Father     Family Support: Good  Lifestyle Diet: Breakfast: coffee, eggs, toast, fruit Lunch: light - sandwich or salad Dinner: roast beef, collard greens Drinks: sweet tea    Current Exercise Habits: Home exercise routine, Type of exercise: Other - see comments;strength training/weights (run on track), Time (Minutes): 15, Frequency (Times/Week): 7, Weekly Exercise (Minutes/Week): 105, Intensity: Moderate  Exercise limited by: respiratory conditions(s). Would like to do more but limited to asthma    Social History   Substance and Sexual Activity  Alcohol Use Not Currently  . Alcohol/week: 15.0 standard drinks  . Types: 15 Cans of beer per week      Social History   Tobacco Use  Smoking Status Former Smoker  . Packs/day: 0.25  . Years: 14.00  . Pack years: 3.50  . Types: Cigarettes  . Start date: 2006  . Quit date: 08/2018  . Years since quitting: 1.8  Smokeless Tobacco Never Used      Health Maintenance  Topic Date Due  .  COVID-19 Vaccine (1) Never done  . TETANUS/TDAP  Never done  . INFLUENZA VACCINE  08/17/2020 (Originally 12/19/2019)  . Hepatitis C Screening  Completed  . HIV Screening  Completed   Health Maintenance/Date Completed  Last ED visit: 03/29/2020 - asthma exacerbation Specialist Visit: 06/06/2020 - pulmonology Dental Exam: unknown Eye Exam: 05/25/2020 Flu Vaccine: planning to get at [facility] Pneumonia Vaccine: 11/07/2017 COVID-19 Vaccine: completed primary series, not due for booster yet  Outpatient Encounter Medications as of 06/06/2020  Medication Sig  . albuterol (PROVENTIL) (2.5 MG/3ML) 0.083% nebulizer solution Take 3 mLs (2.5 mg total) by nebulization every 4 (four) hours as needed for wheezing or shortness of breath.  Marland Kitchen albuterol (VENTOLIN HFA) 108 (90 Base) MCG/ACT inhaler Inhale 2 puffs into the lungs every 6 (six) hours as needed for shortness of breath.  . budesonide-formoterol (SYMBICORT) 160-4.5 MCG/ACT inhaler Inhale 2 puffs into the lungs 2 (two) times daily.  . cetirizine (ZYRTEC) 10 MG tablet Take 1 tablet (10 mg total) by mouth daily.  . fluticasone (VERAMYST) 27.5 MCG/SPRAY nasal spray Place 2 sprays into the nose daily.  . montelukast (SINGULAIR) 10 MG tablet Take 1 tablet (10 mg total) by mouth at bedtime.  Marland Kitchen omeprazole (PRILOSEC) 40 MG capsule Take 1 capsule (40 mg total) by mouth daily.  . predniSONE (DELTASONE) 10 MG tablet Take 1 tablet (10 mg total) by mouth daily with breakfast.  . Tiotropium Bromide Monohydrate (SPIRIVA RESPIMAT) 1.25 MCG/ACT AERS Inhale 2 puffs into the lungs daily. (Patient taking  differently: Inhale 2 puffs into the lungs daily. Dr. [name] says to do 1 puff BID - this his how pt taking it)  . EPINEPHrine 0.3 mg/0.3 mL IJ SOAJ injection Inject 0.3 mg into the muscle as needed for anaphylaxis. (Patient not taking: Reported on 04/25/2020)  . omalizumab Geoffry Paradise) 150 MG/ML prefilled syringe Inject 300 mg into the skin every 14 (fourteen) days. Along with  75 mg syringe. (Patient not taking: Reported on 04/25/2020)  . omalizumab Geoffry Paradise) 75 MG/0.5ML prefilled syringe Inject 75 mg into the skin every 14 (fourteen) days. Along with 150 mg x2 syringe. (Patient not taking: Reported on 04/25/2020)  . [DISCONTINUED] lidocaine (XYLOCAINE) 2 % solution Use as directed 15 mLs in the mouth or throat as needed for mouth pain.  . [DISCONTINUED] Tiotropium Bromide Monohydrate (SPIRIVA RESPIMAT) 1.25 MCG/ACT AERS Inhale 2 puffs into the lungs daily.   No facility-administered encounter medications on file as of 06/06/2020.    Assessment and Plan: Severe, persistent ashtma - steroid dependent Pt has severe asthma for which he currently takes prednisone, alubuterol (HFA and nebulizer), and symbicort. His last visit to the ED was 03/29/2020 for an asthma exacerbation. He was supposed to start taking Xolair a few months ago but unfortunately the paperwork for Epipen is still pending as the forms have been updated and need new signatures. Pt will come by to sign the updated forms. Once Epipen is approved, pt can start taking Xolair which will likely help his asthma immensely. In the mean time the pt takes prednisone which is 10mg  daily however some days the pt takes an extra half tablet.Pt also states that for his Symbicort that he sometimes uses it TID and feels he needs something better. He stated the [facility] worked better for him, unfortunately it is not a medication we are able to order. Pt stated that [name] which could possibly supply dulera for him - he will confirm with Dr. Leonette Most before proceeding with this. He has an appointment today and will address his concerns there. Instructed pt to let Dr. [name] know that we sent paperwork 05/30/2020 so we could get those back ASAP so pt can be started on Xolair ASAP. Pt did state that he is unsure how exactly to take his spiriva - Dr. 07/28/2020 told him 1 puff BID but the ED physician told  him 2 puffs daily; he has been taking 1 puff BID - explained to pt that it is normally dosed as 2 puffs daily and instructed him to clarify at his appt with pulmonology today. Pt is able to exercise (runs on track and does push ups and pull ups) but is limited by his asthma. Despite his limitations he continues to exercise as tolerated every day.   GERD Pt currently takes omeprazole for GERD. He does not think he has acid reflux and feels he does not need it. He will address at his appt.   Access/Adherence Pt is adherent to medications and explained proper inhaler technique. He does not need refills at this time. Paperwork is in process for Epipen and Spiriva so we can get these for the pt ASAP.     [name], PharmD Pharmacy Resident  06/06/2020 9:30 AM

## 2020-06-07 ENCOUNTER — Encounter: Payer: Self-pay | Admitting: Pulmonary Disease

## 2020-06-12 ENCOUNTER — Telehealth: Payer: Self-pay | Admitting: Pulmonary Disease

## 2020-06-12 MED ORDER — PREDNISONE 10 MG PO TABS: 15.0000 mg | ORAL_TABLET | Freq: Every day | ORAL | 0 refills | Status: DC

## 2020-06-12 NOTE — Telephone Encounter (Signed)
Rx for prednisone 15mg  daily has been sent to preferred pharmacy.  Patient is aware and voiced his understanding.  Nothing further needed,

## 2020-06-13 ENCOUNTER — Telehealth: Payer: Self-pay | Admitting: Pulmonary Disease

## 2020-06-13 NOTE — Telephone Encounter (Signed)
Called and spoke with Patient.  Patient stated he has received a new Epipen and is ready to start Xolair injections.  Patient is aware of new injection protocol of 2 hour wait 1st injection. Patient is aware Xolair injections will be given at [facility] office. Patient scheduled 06/29/20 at 0900.

## 2020-06-27 ENCOUNTER — Encounter: Payer: Self-pay | Admitting: Adult Health

## 2020-06-27 ENCOUNTER — Ambulatory Visit (INDEPENDENT_AMBULATORY_CARE_PROVIDER_SITE_OTHER): Payer: Self-pay | Admitting: Adult Health

## 2020-06-27 ENCOUNTER — Other Ambulatory Visit: Payer: Self-pay

## 2020-06-27 DIAGNOSIS — B4481 Allergic bronchopulmonary aspergillosis: Secondary | ICD-10-CM

## 2020-06-27 DIAGNOSIS — J4550 Severe persistent asthma, uncomplicated: Secondary | ICD-10-CM

## 2020-06-27 DIAGNOSIS — J309 Allergic rhinitis, unspecified: Secondary | ICD-10-CM

## 2020-06-27 NOTE — Progress Notes (Signed)
@Patient  ID: , male    DOB: 1985/12/23, 35 y.o.   MRN: [id number]  Chief Complaint  Patient presents with   Asthma    Referring provider: No ref. provider found  HPI: 35 year old male former smoker followed for severe persistent asthma, elevated IgE and eosinophilia , ABPA  TEST/EVENTS :  11/03/19 relative eosinophil count of 52% and absolute eosinophils of 5.7.  RAST profile was positive across the board,   IgE directed to Aspergillus fumigatus Aspergillus 11/05/19 close versicolor.  ANCA panel was negative, total IgE was 3840 Eosinophil count after prednisone therapy: Relative eosinophils 10% and absolute eosinophils 0.5.  06/29/2020 Follow up : Asthma , ABPA  Patient presents for a 1 month follow-up.  Patient has underlying severe persistent asthma, allergic rhinitis and ABPA.  Patient says overall he is doing better.  Breathing is starting to improve.  He is currently on Symbicort twice daily, Spiriva daily.  And chronic steroids currently at 15 mg daily.  Has been approved for Xolair and has a scheduled first injection.  He has received his first EpiPen. Patient denies any increased wheezing.  Increased albuterol use.  Patient says activity level has improved.  Chest x-ray March 29, 2020 showed clear lungs  Allergies  Allergen Reactions   Amoxicillin Hives    Hives all over the body. [facility]. Has patient had a PCN reaction causing immediate rash, facial/tongue/throat swelling, SOB or lightheadedness with hypotension: Yes Has patient had a PCN reaction causing severe rash involving mucus membranes or skin necrosis: No Has patient had a PCN reaction that required hospitalization: No Has patient had a PCN reaction occurring within the last 10 years: Yes If all of the above answers are "NO", then may proceed with Cephalosporin use.   Doxycycline    Fish Allergy Swelling   Shellfish Allergy Swelling   Penicillins Hives    Has patient  had a PCN reaction causing immediate rash, facial/tongue/throat swelling, SOB or lightheadedness with hypotension: Yes Has patient had a PCN reaction causing severe rash involving mucus membranes or skin necrosis: No Has patient had a PCN reaction that required hospitalization: No Has patient had a PCN reaction occurring within the last 10 years: Yes If all of the above answers are "NO", then may proceed with Cephalosporin use.    Immunization History  Administered Date(s) Administered   Pneumococcal Polysaccharide-23 10/19/2017    Past Medical History:  Diagnosis Date   Allergic bronchopulmonary aspergillosis (HCC)    Asthma    Chronic bronchitis (HCC)     Tobacco History: Social History   Tobacco Use  Smoking Status Former Smoker   Packs/day: 1.00   Years: 14.00   Pack years: 14.00   Types: Cigarettes   Start date: 2006   Quit date: 08/2018   Years since quitting: 1.8  Smokeless Tobacco Never Used   Counseling given: Not Answered   Outpatient Medications Prior to Visit  Medication Sig Dispense Refill   albuterol (PROVENTIL) (2.5 MG/3ML) 0.083% nebulizer solution Take 3 mLs (2.5 mg total) by nebulization every 4 (four) hours as needed for wheezing or shortness of breath. 75 mL 2   albuterol (VENTOLIN HFA) 108 (90 Base) MCG/ACT inhaler Inhale 2 puffs into the lungs every 6 (six) hours as needed for shortness of breath. 6.7 g 3   EPINEPHrine 0.3 mg/0.3 mL IJ SOAJ injection Inject 0.3 mg into the muscle as needed for anaphylaxis. 1 each 0   fluticasone (VERAMYST) 27.5 MCG/SPRAY nasal spray Place  2 sprays into the nose daily.     montelukast (SINGULAIR) 10 MG tablet Take 1 tablet (10 mg total) by mouth at bedtime. 90 tablet 2   omalizumab (XOLAIR) 150 MG/ML prefilled syringe Inject 300 mg into the skin every 14 (fourteen) days. Along with 75 mg syringe. 4 mL 2   omalizumab (XOLAIR) 75 MG/0.5ML prefilled syringe Inject 75 mg into the skin every 14 (fourteen)  days. Along with 150 mg x2 syringe. 1 mL 2   omeprazole (PRILOSEC) 40 MG capsule Take 1 capsule (40 mg total) by mouth daily. 30 capsule 2   predniSONE (DELTASONE) 10 MG tablet Take 1.5 tablets (15 mg total) by mouth daily with breakfast. 45 tablet 0   predniSONE (DELTASONE) 10 MG tablet Take 1 tablet (10 mg total) by mouth daily with breakfast. 30 tablet 2   budesonide-formoterol (SYMBICORT) 160-4.5 MCG/ACT inhaler Inhale 2 puffs into the lungs 2 (two) times daily. 1 each 5   cetirizine (ZYRTEC) 10 MG tablet Take 1 tablet (10 mg total) by mouth daily. 30 tablet 0   Tiotropium Bromide Monohydrate (SPIRIVA RESPIMAT) 1.25 MCG/ACT AERS Inhale 2 puffs into the lungs daily. (Patient taking differently: Inhale 2 puffs into the lungs daily. Dr. [name] says to do 1 puff BID - this his how pt taking it) 4 g 5   Tiotropium Bromide Monohydrate (SPIRIVA RESPIMAT) 2.5 MCG/ACT AERS Inhale 2 puffs into the lungs daily for 1 day. 4 g 0   No facility-administered medications prior to visit.     Review of Systems:   Constitutional:   No  weight loss, night sweats,  Fevers, chills, fatigue, or  lassitude.  HEENT:   No headaches,  Difficulty swallowing,  Tooth/dental problems, or  Sore throat,                No sneezing, itching, ear ache,  +nasal congestion, post nasal drip,   CV:  No chest pain,  Orthopnea, PND, swelling in lower extremities, anasarca, dizziness, palpitations, syncope.   GI  No heartburn, indigestion, abdominal pain, nausea, vomiting, diarrhea, change in bowel habits, loss of appetite, bloody stools.   Resp: .  No chest wall deformity  Skin: no rash or lesions.  GU: no dysuria, change in color of urine, no urgency or frequency.  No flank pain, no hematuria   MS:  No joint pain or swelling.  No decreased range of motion.  No back pain.    Physical Exam  BP 122/72 (BP Location: Left Arm, Cuff Size: Normal)    Pulse (!) 107    Temp 98 F (36.7 C) (Temporal)    Ht 5\' 11"   (1.803 m)    Wt 171 lb 3.2 oz (77.7 kg)    SpO2 100%    BMI 23.88 kg/m   GEN: A/Ox3; pleasant , NAD, well nourished    HEENT:  [place]/AT,  , NOSE-clear, THROAT-clear, no lesions, no postnasal drip or exudate noted.   NECK:  Supple w/ fair ROM; no JVD; normal carotid impulses w/o bruits; no thyromegaly or nodules palpated; no lymphadenopathy.    RESP  Clear  P & A; w/o, wheezes/ rales/ or rhonchi. no accessory muscle use, no dullness to percussion  CARD:  RRR, no m/r/g, no peripheral edema, pulses intact, no cyanosis or clubbing.  GI:   Soft & nt; nml bowel sounds; no organomegaly or masses detected.   Musco: Warm bil, no deformities or joint swelling noted.   Neuro: alert, no focal deficits noted.  Skin: Warm, no lesions or rashes    Lab Results:   BNP No results found for: BNP  ProBNP No results found for: PROBNP  Imaging: No results found.    PFT Results Latest Ref Rng & Units 10/29/2017  FVC-Pre L 5.81  FVC-Predicted Pre % 124  Pre FEV1/FVC % % 41  FEV1-Pre L 2.38  FEV1-Predicted Pre % 61  DLCO uncorrected ml/min/mmHg 35.24  DLCO UNC% % 104  DLCO corrected ml/min/mmHg 34.14  DLCO COR %Predicted % 101  DLVA Predicted % 97  TLC L 11.87  TLC % Predicted % 166  RV % Predicted % 360    Lab Results  Component Value Date   NITRICOXIDE 95 10/17/2017        Assessment & Plan:   ABPA (allergic bronchopulmonary aspergillosis) (HCC) Patient is clinically improved.  Continue on prednisone 15  mg daily for now.  Plan for Xolair later this week as planned On return if doing well.  Consider decreasing down to 10 mg daily.  Plan  Patient Instructions  Continue on Symbicort 2 puffs Twice daily   Continue on Spiriva 2 puffs daily  Continue on Zyrtec 10mg  At bedtime  .  Continue on Singulair daily  Continue on Prednisone 15mg  daily y   Albuterol inhaler or Neb As needed   Asthma action plan as discussed .   Xolair as planned this week.  Keep Epi Pen with you.   Follow up with Dr. in 4  weeks   Please contact office for sooner follow up if symptoms do not improve or worsen or seek emergency care     Good to get my   Severe persistent asthma Improved controlled on current regimen.  Continue present meds maintenance regimen  Plan  Patient Instructions  Continue on Symbicort 2 puffs Twice daily   Continue on Spiriva 2 puffs daily  Continue on Zyrtec 10mg  At bedtime  .  Continue on Singulair daily  Continue on Prednisone 15mg  daily y   Albuterol inhaler or Neb As needed   Asthma action plan as discussed .   Xolair as planned this week.  Keep Epi Pen with you.  Follow up with Dr. in 4  weeks   Please contact office for sooner follow up if symptoms do not improve or worsen or seek emergency care          [name], NP 06/29/2020

## 2020-06-27 NOTE — Patient Instructions (Signed)
Continue on Symbicort 2 puffs Twice daily   Continue on Spiriva 2 puffs daily  Continue on Zyrtec 10mg  At bedtime  .  Continue on Singulair daily  Continue on Prednisone 15mg  daily y   Albuterol inhaler or Neb As needed   Asthma action plan as discussed .   Xolair as planned this week.  Keep Epi Pen with you.  Follow up with Dr. in 4  weeks   Please contact office for sooner follow up if symptoms do not improve or worsen or seek emergency care

## 2020-06-29 ENCOUNTER — Telehealth: Payer: Self-pay | Admitting: Pulmonary Disease

## 2020-06-29 ENCOUNTER — Ambulatory Visit: Payer: Medicaid Other

## 2020-06-29 NOTE — Assessment & Plan Note (Signed)
Improved controlled on current regimen.  Continue present meds maintenance regimen  Plan  Patient Instructions  Continue on Symbicort 2 puffs Twice daily   Continue on Spiriva 2 puffs daily  Continue on Zyrtec 10mg  At bedtime  .  Continue on Singulair daily  Continue on Prednisone 15mg  daily y   Albuterol inhaler or Neb As needed   Asthma action plan as discussed .   Xolair as planned this week.  Keep Epi Pen with you.  Follow up with Dr. in 4  weeks   Please contact office for sooner follow up if symptoms do not improve or worsen or seek emergency care

## 2020-06-29 NOTE — Assessment & Plan Note (Signed)
Patient is clinically improved.  Continue on prednisone 15  mg daily for now.  Plan for Xolair later this week as planned On return if doing well.  Consider decreasing down to 10 mg daily.  Plan  Patient Instructions  Continue on Symbicort 2 puffs Twice daily   Continue on Spiriva 2 puffs daily  Continue on Zyrtec 10mg  At bedtime  .  Continue on Singulair daily  Continue on Prednisone 15mg  daily y   Albuterol inhaler or Neb As needed   Asthma action plan as discussed .   Xolair as planned this week.  Keep Epi Pen with you.  Follow up with Dr. in 4  weeks   Please contact office for sooner follow up if symptoms do not improve or worsen or seek emergency care     Good to get my

## 2020-06-29 NOTE — Telephone Encounter (Signed)
Spoke with Patient.  Patient is rescheduled to start Xolair 07/05/20 at 1430. Office protocol reviewed.  Patient is aware of office location. Nothing further at this time.

## 2020-06-29 NOTE — Assessment & Plan Note (Signed)
Continue on current regimen .   

## 2020-07-05 ENCOUNTER — Ambulatory Visit (INDEPENDENT_AMBULATORY_CARE_PROVIDER_SITE_OTHER): Payer: Self-pay

## 2020-07-05 ENCOUNTER — Other Ambulatory Visit: Payer: Self-pay

## 2020-07-05 DIAGNOSIS — J4550 Severe persistent asthma, uncomplicated: Secondary | ICD-10-CM

## 2020-07-05 MED ORDER — OMALIZUMAB 150 MG/ML ~~LOC~~ SOSY
300.0000 mg | PREFILLED_SYRINGE | Freq: Once | SUBCUTANEOUS | Status: AC
  Administered 2020-07-05: 300 mg via SUBCUTANEOUS

## 2020-07-05 MED ORDER — OMALIZUMAB 75 MG/0.5ML ~~LOC~~ SOSY
75.0000 mg | PREFILLED_SYRINGE | Freq: Once | SUBCUTANEOUS | Status: AC
  Administered 2020-07-05: 75 mg via SUBCUTANEOUS

## 2020-07-05 NOTE — Progress Notes (Signed)
Patient presented to the office today for first-time Xolair injection.  Primary Pulmonologist: Dr . Sarina Ser Medication name: Xolair Strength: 375mg  Site(s): right arm  Epi pen/Auvi-Q visible during appointment: Yes  Time of injection: 1445  Patient evaluated every 15-20 minutes per protocol x2 hours.  1st check: 1500 Evaluation: Patient sitting quietly. Patient denies any side or adverse effects.  No redness or swelling at injections sites.  2nd check: 1515  Evaluation: Patient sitting talking with girlfriend. No redness or swelling at injection sites.  3rd check: 1530  Evaluation: Patient denies any side or adverse effects.  4th check: 1545   Evaluation:Pateint denies any problems. No redness or swelling at injection sites.  5th check: 1600  Evaluation: Patient denies problems.  6th check: 1615  Evaluation: Patient denies problems.  No redness or swelling at injection sites.  7th check: 1630  Evaluation:Patient denies any problems.   8th check: 1645  Evaluation: Patient denies any problems.  No redness or swelling at injection sites.  Patient shown how and when to use Epipen with demo pen.  Patient instructed on possible side effects to look for. Patient also instructed on self administration and policy for transition to home injections. Patient scheduled next Xolair injection.

## 2020-07-07 ENCOUNTER — Encounter: Payer: Self-pay | Admitting: Emergency Medicine

## 2020-07-07 ENCOUNTER — Other Ambulatory Visit: Payer: Self-pay

## 2020-07-07 ENCOUNTER — Observation Stay
Admission: EM | Admit: 2020-07-07 | Discharge: 2020-07-09 | Disposition: A | Discharge status: Home / Self Care | Payer: Medicaid Other | Attending: Hospitalist | Admitting: Hospitalist

## 2020-07-07 ENCOUNTER — Telehealth: Payer: Self-pay | Admitting: Pulmonary Disease

## 2020-07-07 ENCOUNTER — Emergency Department: Payer: Medicaid Other

## 2020-07-07 DIAGNOSIS — Z79899 Other long term (current) drug therapy: Secondary | ICD-10-CM | POA: Insufficient documentation

## 2020-07-07 DIAGNOSIS — T50905A Adverse effect of unspecified drugs, medicaments and biological substances, initial encounter: Secondary | ICD-10-CM

## 2020-07-07 DIAGNOSIS — T7840XA Allergy, unspecified, initial encounter: Secondary | ICD-10-CM

## 2020-07-07 DIAGNOSIS — T486X5A Adverse effect of antiasthmatics, initial encounter: Secondary | ICD-10-CM | POA: Insufficient documentation

## 2020-07-07 DIAGNOSIS — T450X5A Adverse effect of antiallergic and antiemetic drugs, initial encounter: Secondary | ICD-10-CM | POA: Diagnosis present

## 2020-07-07 DIAGNOSIS — Z87891 Personal history of nicotine dependence: Secondary | ICD-10-CM | POA: Insufficient documentation

## 2020-07-07 DIAGNOSIS — T886XXA Anaphylactic reaction due to adverse effect of correct drug or medicament properly administered, initial encounter: Secondary | ICD-10-CM

## 2020-07-07 DIAGNOSIS — J4551 Severe persistent asthma with (acute) exacerbation: Secondary | ICD-10-CM

## 2020-07-07 DIAGNOSIS — U071 COVID-19: Secondary | ICD-10-CM | POA: Diagnosis present

## 2020-07-07 DIAGNOSIS — J45901 Unspecified asthma with (acute) exacerbation: Principal | ICD-10-CM | POA: Insufficient documentation

## 2020-07-07 LAB — BASIC METABOLIC PANEL
Anion gap: 8 (ref 5–15)
BUN: 9 mg/dL (ref 6–20)
CO2: 26 mmol/L (ref 22–32)
Calcium: 8.8 mg/dL — ABNORMAL LOW (ref 8.9–10.3)
Chloride: 104 mmol/L (ref 98–111)
Creatinine, Ser: 1.04 mg/dL (ref 0.61–1.24)
GFR, Estimated: >60 mL/min (ref >60)
Glucose, Bld: 103 mg/dL — ABNORMAL HIGH (ref 70–99)
Potassium: 3.9 mmol/L (ref 3.5–5.1)
Sodium: 138 mmol/L (ref 135–145)

## 2020-07-07 LAB — RESP PANEL BY RT-PCR (FLU A&B, COVID) ARPGX2
Influenza A by PCR: NEGATIVE
Influenza B by PCR: NEGATIVE
SARS Coronavirus 2 by RT PCR: POSITIVE — AB

## 2020-07-07 LAB — CBC WITH DIFFERENTIAL/PLATELET
Abs Immature Granulocytes: 0.05 10*3/uL (ref 0.00–0.07)
Basophils Absolute: 0.1 10*3/uL (ref 0.0–0.1)
Basophils Relative: 1 %
Eosinophils Absolute: 1.3 10*3/uL — ABNORMAL HIGH (ref 0.0–0.5)
Eosinophils Relative: 11 %
HCT: 47.7 % (ref 39.0–52.0)
Hemoglobin: 16.3 g/dL (ref 13.0–17.0)
Immature Granulocytes: 1 %
Lymphocytes Relative: 17 %
Lymphs Abs: 1.8 10*3/uL (ref 0.7–4.0)
MCH: 31.1 pg (ref 26.0–34.0)
MCHC: 34.2 g/dL (ref 30.0–36.0)
MCV: 91 fL (ref 80.0–100.0)
Monocytes Absolute: 0.5 10*3/uL (ref 0.1–1.0)
Monocytes Relative: 4 %
Neutro Abs: 7.4 10*3/uL (ref 1.7–7.7)
Neutrophils Relative %: 66 %
Platelets: 308 10*3/uL (ref 150–400)
RBC: 5.24 MIL/uL (ref 4.22–5.81)
RDW: 13.8 % (ref 11.5–15.5)
WBC: 11.1 10*3/uL — ABNORMAL HIGH (ref 4.0–10.5)
nRBC: 0 % (ref 0.0–0.2)

## 2020-07-07 LAB — MAGNESIUM: Magnesium: 2.1 mg/dL (ref 1.7–2.4)

## 2020-07-07 MED ORDER — ACETAMINOPHEN 325 MG PO TABS: 650.0000 mg | ORAL_TABLET | Freq: Four times a day (QID) | ORAL | Status: DC | PRN

## 2020-07-07 MED ORDER — DIPHENHYDRAMINE HCL 50 MG/ML IJ SOLN
25.0000 mg | Freq: Once | INTRAMUSCULAR | Status: AC
  Administered 2020-07-07: 25 mg via INTRAVENOUS
  Filled 2020-07-07: qty 1

## 2020-07-07 MED ORDER — IPRATROPIUM-ALBUTEROL 0.5-2.5 (3) MG/3ML IN SOLN
3.0000 mL | Freq: Once | RESPIRATORY_TRACT | Status: AC
  Administered 2020-07-07: 3 mL via RESPIRATORY_TRACT
  Filled 2020-07-07: qty 3

## 2020-07-07 MED ORDER — ALBUTEROL SULFATE HFA 108 (90 BASE) MCG/ACT IN AERS
2.0000 | INHALATION_SPRAY | Freq: Four times a day (QID) | RESPIRATORY_TRACT | Status: DC | PRN
  Filled 2020-07-07: qty 6.7

## 2020-07-07 MED ORDER — ONDANSETRON HCL 4 MG/2ML IJ SOLN: 4.0000 mg | Freq: Four times a day (QID) | INTRAMUSCULAR | Status: DC | PRN

## 2020-07-07 MED ORDER — MAGNESIUM SULFATE 2 GM/50ML IV SOLN
2.0000 g | INTRAVENOUS | Status: AC
  Administered 2020-07-07: 2 g via INTRAVENOUS
  Filled 2020-07-07: qty 50

## 2020-07-07 MED ORDER — ONDANSETRON HCL 4 MG PO TABS: 4.0000 mg | ORAL_TABLET | Freq: Four times a day (QID) | ORAL | Status: DC | PRN

## 2020-07-07 MED ORDER — LORATADINE 10 MG PO TABS
10.0000 mg | ORAL_TABLET | Freq: Every day | ORAL | Status: DC
  Administered 2020-07-08 – 2020-07-09 (×2): 10 mg via ORAL
  Filled 2020-07-07 (×2): qty 1

## 2020-07-07 MED ORDER — IPRATROPIUM-ALBUTEROL 0.5-2.5 (3) MG/3ML IN SOLN: 3.0000 mL | Freq: Four times a day (QID) | RESPIRATORY_TRACT | Status: DC

## 2020-07-07 MED ORDER — MORPHINE SULFATE (PF) 2 MG/ML IV SOLN: 2.0000 mg | INTRAVENOUS | Status: DC | PRN

## 2020-07-07 MED ORDER — FAMOTIDINE IN NACL 20-0.9 MG/50ML-% IV SOLN
20.0000 mg | Freq: Once | INTRAVENOUS | Status: AC
  Administered 2020-07-07: 20 mg via INTRAVENOUS
  Filled 2020-07-07: qty 50

## 2020-07-07 MED ORDER — ACETAMINOPHEN 650 MG RE SUPP: 650.0000 mg | Freq: Four times a day (QID) | RECTAL | Status: DC | PRN

## 2020-07-07 MED ORDER — METHYLPREDNISOLONE SODIUM SUCC 125 MG IJ SOLR
80.0000 mg | Freq: Four times a day (QID) | INTRAMUSCULAR | Status: DC
  Administered 2020-07-07 – 2020-07-08 (×3): 80 mg via INTRAVENOUS
  Filled 2020-07-07 (×3): qty 2

## 2020-07-07 MED ORDER — IPRATROPIUM-ALBUTEROL 20-100 MCG/ACT IN AERS
1.0000 | INHALATION_SPRAY | Freq: Four times a day (QID) | RESPIRATORY_TRACT | Status: DC
  Administered 2020-07-07 – 2020-07-09 (×6): 1 via RESPIRATORY_TRACT
  Filled 2020-07-07: qty 4

## 2020-07-07 MED ORDER — EPINEPHRINE 0.3 MG/0.3ML IJ SOAJ
0.3000 mg | INTRAMUSCULAR | Status: DC | PRN
  Filled 2020-07-07: qty 0.3

## 2020-07-07 MED ORDER — MONTELUKAST SODIUM 10 MG PO TABS
10.0000 mg | ORAL_TABLET | Freq: Every day | ORAL | Status: DC
  Administered 2020-07-07 – 2020-07-08 (×2): 10 mg via ORAL
  Filled 2020-07-07 (×2): qty 1

## 2020-07-07 MED ORDER — MOMETASONE FURO-FORMOTEROL FUM 200-5 MCG/ACT IN AERO
2.0000 | INHALATION_SPRAY | Freq: Two times a day (BID) | RESPIRATORY_TRACT | Status: DC
  Administered 2020-07-07 – 2020-07-09 (×4): 2 via RESPIRATORY_TRACT
  Filled 2020-07-07: qty 8.8

## 2020-07-07 MED ORDER — METHYLPREDNISOLONE SODIUM SUCC 125 MG IJ SOLR
125.0000 mg | Freq: Once | INTRAMUSCULAR | Status: AC
  Administered 2020-07-07: 125 mg via INTRAVENOUS
  Filled 2020-07-07: qty 2

## 2020-07-07 MED ORDER — TIOTROPIUM BROMIDE MONOHYDRATE 1.25 MCG/ACT IN AERS: 2.0000 | INHALATION_SPRAY | Freq: Every day | RESPIRATORY_TRACT | Status: DC

## 2020-07-07 MED ORDER — ALBUTEROL SULFATE (2.5 MG/3ML) 0.083% IN NEBU
5.0000 mg | INHALATION_SOLUTION | Freq: Once | RESPIRATORY_TRACT | Status: AC
  Administered 2020-07-07: 5 mg via RESPIRATORY_TRACT
  Filled 2020-07-07: qty 6

## 2020-07-07 MED ORDER — ENOXAPARIN SODIUM 40 MG/0.4ML ~~LOC~~ SOLN
40.0000 mg | SUBCUTANEOUS | Status: DC
  Administered 2020-07-07: 40 mg via SUBCUTANEOUS
  Filled 2020-07-07 (×2): qty 0.4

## 2020-07-07 NOTE — Telephone Encounter (Signed)
Called patient's spouse, [name](DPR). [name] became upset and stated that "you guys are late,he is in the back of the ambulance" and she ended the call prematurely. I called [name] back to obtain symptoms to make MD aware of reaction. She became upset again and stated that she called our office and asked if she should give patient his Epipen and we hung up on her. She did not have the name of the staff member that she spoke with. [name] used foul language when voicing her concern. I apologized and she stated that "no you are not sorry, this is not the first time this has happen". [name] ended the call prematurely again.  I have verbally made Dr. [name] aware this.

## 2020-07-07 NOTE — H&P (Addendum)
History and Physical:    Shannon Knapp   KDX:[id number] DOB: Nov 02, 1985 DOA: 07/07/2020  Referring MD/provider: Alfonse Flavors, MD [facility]: [name]   Patient coming from: Home  Chief Complaint: Shortness of breath  History of Present Illness:   Shannon Knapp is a 35 y.o. male with medical history significant for severe asthma on chronic prednisone for at least 3 years, allergies, bronchitis, allergic bronchopulmonary aspergillosis, who presented to the hospital because of shortness of breath, cough and wheezing.  He said he recently received Xolair infusion 2 days ago.  He said he was observed for 2 hours post infusion.  He did not have any problems then.  Yesterday evening, he noticed that he was coughing more and is associated with wheezing or shortness of breath.  He vomited once last night.  He could barely sleep because of his symptoms. He used his albuterol via nebulizer but it did not really help.  Today, he was still not feeling better.  He noticed swelling/bump on his right arm.  He vomited about 3 times this morning.  He also felt that his throat was closing and his chest felt tight.  EMS was called and there was concern for allergic reaction so he was brought to the hospital for further evaluation.  He said in route to the hospital, he was given Benadryl, albuterol via nebulizer and epinephrine via nebulizer. He said he got his second Covid vaccine in December 2021. No fever, chills, abdominal pain, diarrhea, pain in the extremities, headache, loss of consciousness.   ED Course: Incidentally, he tested positive for Covid infection.  The patient was given albuterol via nebulizer, IV Benadryl, IV magnesium sulfate, IV Pepcid and IV Solu-Medrol.  Chest x-ray did not show any acute infiltrate.   ROS:   ROS all other systems reviewed were negative  Past Medical History:   Past Medical History:  Diagnosis Date  . Allergic bronchopulmonary  aspergillosis (HCC)   . Asthma   . Chronic bronchitis (HCC)     Past Surgical History:   Past Surgical History:  Procedure Laterality Date  . VIDEO BRONCHOSCOPY Bilateral 10/24/2017   Procedure: VIDEO BRONCHOSCOPY WITHOUT FLUORO;  Surgeon: Alyson Reedy, MD;  Location: [facility] ENDOSCOPY;  Service: Cardiopulmonary;  Laterality: Bilateral;    Social History:   Social History   Socioeconomic History  . Marital status: Single    Spouse name: Not on file  . Number of children: Not on file  . Years of education: Not on file  . Highest education level: Not on file  Occupational History  . Not on file  Tobacco Use  . Smoking status: Former Smoker    Packs/day: 1.00    Years: 14.00    Pack years: 14.00    Types: Cigarettes    Start date: 2006    Quit date: 08/2018    Years since quitting: 1.8  . Smokeless tobacco: Never Used  Vaping Use  . Vaping Use: Never used  Substance and Sexual Activity  . Alcohol use: Not Currently    Alcohol/week: 15.0 standard drinks    Types: 15 Cans of beer per week  . Drug use: No  . Sexual activity: Yes  Other Topics Concern  . Not on file  Social History Narrative  . Not on file   Social Determinants of Health   Financial Resource Strain: Not on file  Food Insecurity: Not on file  Transportation Needs: Not on file  Physical Activity: Not  on file  Stress: Not on file  Social Connections: Not on file  Intimate Partner Violence: Not on file    Allergies   Amoxicillin, Omalizumab, Doxycycline, Fish allergy, Shellfish allergy, and Penicillins  Family history:   Family History  Problem Relation Age of Onset  . Hypertension Mother   . Diabetes Mother   . Asthma Father   . Hypertension Father     Current Medications:   Prior to Admission medications   Medication Sig Start Date End Date Taking? Authorizing Provider  albuterol (PROVENTIL) (2.5 MG/3ML) 0.083% nebulizer solution Take 3 mLs (2.5 mg total) by nebulization every 4 (four)  hours as needed for wheezing or shortness of breath. 03/28/20 03/28/21  Lucy Chris, PA  albuterol (VENTOLIN HFA) 108 (90 Base) MCG/ACT inhaler Inhale 2 puffs into the lungs every 6 (six) hours as needed for shortness of breath. 01/07/20   Parrett, Virgel Bouquet, NP  budesonide-formoterol (SYMBICORT) 160-4.5 MCG/ACT inhaler Inhale 2 puffs into the lungs 2 (two) times daily. 05/09/20 06/08/20  Salena Saner, MD  cetirizine (ZYRTEC) 10 MG tablet Take 1 tablet (10 mg total) by mouth daily. 04/02/20 05/02/20  Briant Cedar, MD  EPINEPHrine 0.3 mg/0.3 mL IJ SOAJ injection Inject 0.3 mg into the muscle as needed for anaphylaxis. 04/02/20   Briant Cedar, MD  fluticasone (VERAMYST) 27.5 MCG/SPRAY nasal spray Place 2 sprays into the nose daily.    [provider]  montelukast (SINGULAIR) 10 MG tablet Take 1 tablet (10 mg total) by mouth at bedtime. 04/25/20   Glenford Bayley, NP  omalizumab Geoffry Paradise) 150 MG/ML prefilled syringe Inject 300 mg into the skin every 14 (fourteen) days. Along with 75 mg syringe. 12/08/19   Yopp, Amber C, RPH-CPP  omalizumab Geoffry Paradise) 75 MG/0.5ML prefilled syringe Inject 75 mg into the skin every 14 (fourteen) days. Along with 150 mg x2 syringe. 12/08/19   Yopp, Amber C, RPH-CPP  omeprazole (PRILOSEC) 40 MG capsule Take 1 capsule (40 mg total) by mouth daily. 04/25/20   Glenford Bayley, NP  predniSONE (DELTASONE) 10 MG tablet Take 1.5 tablets (15 mg total) by mouth daily with breakfast. 06/12/20   Salena Saner, MD  Tiotropium Bromide Monohydrate (SPIRIVA RESPIMAT) 1.25 MCG/ACT AERS Inhale 2 puffs into the lungs daily. Patient taking differently: Inhale 2 puffs into the lungs daily. Dr. [name] says to do 1 puff BID - this his how pt taking it 05/09/20 06/08/20  Salena Saner, MD    Physical Exam:   Vitals:   07/07/20 1430 07/07/20 1500 07/07/20 1530 07/07/20 1625  BP: 136/90 136/86 131/83 132/88  Pulse: 99 87 99 98  Resp: (!) 23 20 20  (!) 22   Temp:      TempSrc:      SpO2: 97% 94% 97% 94%  Weight:      Height:         Physical Exam: Blood pressure 132/88, pulse 98, temperature 98.3 F (36.8 C), temperature source Oral, resp. rate (!) 22, height 5\' 11"  (1.803 m), weight 75.8 kg, SpO2 94 %. Gen: No acute distress. Head: Normocephalic, atraumatic. Eyes: Pupils equal, round and reactive to light. Extraocular movements intact.  Sclerae nonicteric.  Mouth: Normal-looking oropharynx.  Good dentition.. Neck: Supple, no thyromegaly, no lymphadenopathy, no jugular venous distention. Chest: Decreased air entry bilaterally.  Mild bilateral expiratory wheezing.  No rales heard.   CV: Heart sounds are regular with an S1, S2. No murmurs, rubs or gallops.  Abdomen: Soft, nontender, nondistended  with normal active bowel sounds. No palpable masses. Extremities: Extremities are without clubbing, or cyanosis. No edema. Pedal pulses 2+.  Skin: There is a lump/swelling on the right posterior lateral arm.  He has chronic hyperpigmented maculopapular lesions on the chest and back which he attributes to chronic prednisone therapy. Neuro: Alert and oriented times 3; grossly nonfocal.  Psych: Insight is good and judgment is appropriate. Mood and affect normal.   Data Review:    Labs: Basic Metabolic Panel: Recent Labs  Lab 07/07/20 1407  NA 138  K 3.9  CL 104  CO2 26  GLUCOSE 103*  BUN 9  CREATININE 1.04  CALCIUM 8.8*  MG 2.1   Liver Function Tests: No results for input(s): AST, ALT, ALKPHOS, BILITOT, PROT, ALBUMIN in the last 168 hours. No results for input(s): LIPASE, AMYLASE in the last 168 hours. No results for input(s): AMMONIA in the last 168 hours. CBC: Recent Labs  Lab 07/07/20 1407  WBC 11.1*  NEUTROABS 7.4  HGB 16.3  HCT 47.7  MCV 91.0  PLT 308   Cardiac Enzymes: No results for input(s): CKTOTAL, CKMB, CKMBINDEX, TROPONINI in the last 168 hours.  BNP (last 3 results) No results for input(s): PROBNP in the  last 8760 hours. CBG: No results for input(s): GLUCAP in the last 168 hours.  Urinalysis    Component Value Date/Time   COLORURINE YELLOW (A) 03/30/2020 0414   APPEARANCEUR HAZY (A) 03/30/2020 0414   LABSPEC 1.027 03/30/2020 0414   PHURINE 5.0 03/30/2020 0414   GLUCOSEU NEGATIVE 03/30/2020 0414   HGBUR NEGATIVE 03/30/2020 0414   BILIRUBINUR NEGATIVE 03/30/2020 0414   KETONESUR 20 (A) 03/30/2020 0414   PROTEINUR 30 (A) 03/30/2020 0414   NITRITE NEGATIVE 03/30/2020 0414   LEUKOCYTESUR NEGATIVE 03/30/2020 0414      Radiographic Studies: DG Chest Portable 1 View  Result Date: 07/07/2020 CLINICAL DATA:  Shortness of breath, wheezing, history asthma, bronchitis, former smoker EXAM: PORTABLE CHEST 1 VIEW COMPARISON:  Portable exam 1407 hours compared to 03/29/2020 FINDINGS: Normal heart size, mediastinal contours, and pulmonary vascularity. Minimal chronic peribronchial thickening consistent with history of asthma and bronchitis. No acute infiltrate, pleural effusion, or pneumothorax. Osseous structures unremarkable. IMPRESSION: Minimal chronic peribronchial thickening consistent with history of asthma and bronchitis. No acute infiltrate. Electronically Signed   By: Ulyses SouthwardMark  Boles M.D.   On: 07/07/2020 14:30    EKG: Independently reviewed.  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    Acute asthma exacerbation: Admit to MedSurg for observation.  Monitor on telemetry.  Continue IV steroids and bronchodilators.  Probable allergic reaction/anaphylaxis to omalizumab (Xolair): He was given epinephrine by EMS prior to coming to the ED.  He already received IV steroids, IV Pepcid, IV mag sulfate, DuoNeb and IV Benadryl in the emergency room.  He has been advised to notify his pulmonologist, Dr.  Jayme CloudGonzalez, about this. Omalizumab has been added to his allergy list on the chart  COVID-19 infection: He got his second dose of COVID vaccine in December 2021 he is tolerating room air.  No indication for treatment at this time.   Other information:   DVT prophylaxis: enoxaparin (LOVENOX) injection 40 mg Start: 07/07/20 2200Lovenox  Code Status: Full code. Family Communication: None Disposition Plan: Home in 1 to 2 days Consults called: None Admission status:  Observation  The medical decision making on this patient was of high complexity and the patient is at high risk for clinical deterioration, therefore this is a level 3 visit.  50 minutes  Gilman Olazabal [facility] Pager: Please check [email or web address]   How to contact the [facility] Attending or Consulting provider 7A - 7P or covering provider during after hours 7P -7A, for this patient?   1. Check the care team in [facility] and look for a) attending/consulting [facility] provider listed and b) the [facility] team listed 2. Log into [email or web address] and use [facility] universal password to access. If you do not have the password, please contact the hospital operator. 3. Locate the [facility] provider you are looking for under [facility] and page to a number that you can be directly reached. 4. If you still have difficulty reaching the provider, please page the [facility] (Director on Call) for the Hospitalists listed on amion for assistance.  07/07/2020, 6:24 PM

## 2020-07-07 NOTE — ED Provider Notes (Signed)
[facility] Emergency Department Provider Note  ____________________________________________  Time seen: Approximately 3:28 PM  I have reviewed the triage vital signs and the nursing notes.   HISTORY  Chief Complaint Allergic Reaction    HPI Shannon Knapp is a 35 y.o. male with a history of severe persistent asthma who comes the ED complaining of worsening shortness of breath, throat swelling that started last night.  Patient is on maximal therapy for asthma chronically.  Few days ago he received an injection of Xolair monoclonal antibody to help decrease his asthma severity.  However, that same evening after receiving the injection he started to feel hot and restless, and throughout yesterday started developing worsening shortness of breath, nonproductive cough, sensation of throat swelling.  No vomiting, no rash.  EMS report giving him 25 mg of IV Benadryl, albuterol neb, epinephrine neb during transport.      Past Medical History:  Diagnosis Date  . Allergic bronchopulmonary aspergillosis (HCC)   . Asthma   . Chronic bronchitis [facility](HCC)      Patient Active Problem List   Diagnosis Date Noted  . GERD (gastroesophageal reflux disease) 05/03/2020  . 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 Community acquired pneumonia of left lower lobe of lung 10/25/2019  . Medication management 04/30/2019  . Healthcare maintenance 11/26/2018  . Severe persistent asthma 05/28/2018  . Acute febrile illness 04/10/2018  . Upper airway cough syndrome   . Hypoxemia   . Syncope   . Allergic rhinitis   . Cough   . Hemoptysis   . Protein-calorie malnutrition, severe 10/20/2017  . Acute respiratory failure with hypoxemia (HCC) 10/20/2017  . Acute respiratory distress   . Asthma exacerbation   . Status asthmaticus 10/18/2017  . Severe persistent acute asthmatic bronchitis 10/17/2017  . Elevated  blood pressure reading without diagnosis of hypertension 10/17/2017  . Respiratory distress 10/17/2017     Past Surgical History:  Procedure Laterality Date  . VIDEO BRONCHOSCOPY Bilateral 10/24/2017   Procedure: VIDEO BRONCHOSCOPY WITHOUT FLUORO;  Surgeon: Alyson ReedyYacoub, Wesam G, MD;  [facility];  Service: Cardiopulmonary;  Laterality: Bilateral;     Prior to Admission medications   Medication Sig Start Date End Date Taking? Authorizing Provider  albuterol (PROVENTIL) (2.5 MG/3ML) 0.083% nebulizer solution Take 3 mLs (2.5 mg total) by nebulization every 4 (four) hours as needed for wheezing or shortness of breath. 03/28/20 03/28/21  Lucy Chrisodgers, Caitlin J, PA  albuterol (VENTOLIN HFA) 108 (90 Base) MCG/ACT inhaler Inhale 2 puffs into the lungs every 6 (six) hours as needed for shortness of breath. 01/07/20   Parrett, Virgel Bouquetammy S, NP  budesonide-formoterol (SYMBICORT) 160-4.5 MCG/ACT inhaler Inhale 2 puffs into the lungs 2 (two) times daily. 05/09/20 06/08/20  Salena SanerGonzalez, Carmen L, MD  cetirizine (ZYRTEC) 10 MG tablet Take 1 tablet (10 mg total) by mouth daily. 04/02/20 05/02/20  Briant CedarEzenduka, Nkeiruka J, MD  EPINEPHrine 0.3 mg/0.3 mL IJ SOAJ injection Inject 0.3 mg into the muscle as needed for anaphylaxis. 04/02/20   Briant CedarEzenduka, Nkeiruka J, MD  fluticasone (VERAMYST) 27.5 MCG/SPRAY nasal spray Place 2 sprays into the nose daily.    [provider]  montelukast (SINGULAIR) 10 MG tablet Take 1 tablet (10 mg total) by mouth at bedtime. 04/25/20   Glenford BayleyWalsh, Elizabeth W, NP  omalizumab Geoffry Paradise(XOLAIR) 150 MG/ML prefilled syringe Inject 300 mg into the skin every 14 (fourteen) days. Along with 75 mg syringe. 12/08/19   Yopp, Amber C, RPH-CPP  omalizumab Geoffry Paradise) 75 MG/0.5ML prefilled syringe Inject 75 mg into the skin every 14 (fourteen) days. Along with 150 mg x2 syringe. 12/08/19   Yopp, Amber C, RPH-CPP  omeprazole (PRILOSEC) 40 MG capsule Take 1 capsule (40 mg total) by mouth daily. 04/25/20   Glenford Bayley,  NP  predniSONE (DELTASONE) 10 MG tablet Take 1.5 tablets (15 mg total) by mouth daily with breakfast. 06/12/20   Salena Saner, MD  Tiotropium Bromide Monohydrate (SPIRIVA RESPIMAT) 1.25 MCG/ACT AERS Inhale 2 puffs into the lungs daily. Patient taking differently: Inhale 2 puffs into the lungs daily. Dr. [name] says to do 1 puff BID - this his how pt taking it 05/09/20 06/08/20  Salena Saner, MD     Allergies Amoxicillin, Doxycycline, Fish allergy, Shellfish allergy, and Penicillins   Family History  Problem Relation Age of Onset  . Hypertension Mother   . Diabetes Mother   . Asthma Father   . Hypertension Father     Social History Social History   Tobacco Use  . Smoking status: Former Smoker    Packs/day: 1.00    Years: 14.00    Pack years: 14.00    Types: Cigarettes    Start date: 2006    Quit date: 08/2018    Years since quitting: 1.8  . Smokeless tobacco: Never Used  Vaping Use  . Vaping Use: Never used  Substance Use Topics  . Alcohol use: Not Currently    Alcohol/week: 15.0 standard drinks    Types: 15 Cans of beer per week  . Drug use: No    Review of Systems  Constitutional:   No fever or chills.  ENT:   No sore throat. No rhinorrhea. Cardiovascular:   No chest pain or syncope. Respiratory:   Positive shortness of breath and cough. Gastrointestinal:   Negative for abdominal pain, vomiting and diarrhea.  Musculoskeletal:   Negative for focal pain or swelling All other systems reviewed and are negative except as documented above in ROS and HPI.  ____________________________________________   PHYSICAL EXAM:  VITAL SIGNS: ED Triage Vitals  Enc Vitals Group     BP 07/07/20 1402 (!) 150/128     Pulse Rate 07/07/20 1402 (!) 102     Resp 07/07/20 1402 20     Temp 07/07/20 1402 98.3 F (36.8 C)     Temp Source 07/07/20 1402 Oral     SpO2 07/07/20 1356 100 %     Weight 07/07/20 1400 167 lb (75.8 kg)     Height 07/07/20 1400 5\' 11"  (1.803 m)      Head Circumference --      Peak Flow --      Pain Score 07/07/20 1359 8     Pain Loc --      Pain Edu? --      Excl. in GC? --     Vital signs reviewed, nursing assessments reviewed.   Constitutional:   Alert and oriented. Non-toxic appearance. Eyes:   Conjunctivae are normal. EOMI. PERRL. ENT      Head:   Normocephalic and atraumatic.      Nose:   Normal.      Mouth/Throat:   Normal, no floor mouth edema or tongue elevation, no tongue swelling, normal uvula and tonsils..      Neck:   No meningismus. Full ROM. Hematological/Lymphatic/Immunilogical:   No cervical lymphadenopathy. Cardiovascular:   RRR. Symmetric bilateral radial and DP pulses.  No murmurs. Cap refill less than 2 seconds. Respiratory:  Tachypnea with mild accessory muscle use.  Inspiratory and expiratory wheezing, prolonged expiratory phase. Gastrointestinal:   Soft and nontender. Non distended. There is no CVA tenderness.  No rebound, rigidity, or guarding. Musculoskeletal:   Normal range of motion in all extremities. No joint effusions.  No lower extremity tenderness.  No edema. Neurologic:   Normal speech and language.  Motor grossly intact. No acute focal neurologic deficits are appreciated.  Skin:    Skin is warm, dry and intact. No rash noted.  No petechiae, purpura, or bullae.  ____________________________________________    LABS (pertinent positives/negatives) (all labs ordered are listed, but only abnormal results are displayed) Labs Reviewed  BASIC METABOLIC PANEL - Abnormal; Notable for the following components:      Result Value   Glucose, Bld 103 (*)    Calcium 8.8 (*)    All other components within normal limits  CBC WITH DIFFERENTIAL/PLATELET - Abnormal; Notable for the following components:   WBC 11.1 (*)    Eosinophils Absolute 1.3 (*)    All other components within normal limits  MAGNESIUM    ____________________________________________   EKG    ____________________________________________    RADIOLOGY  DG Chest Portable 1 View  Result Date: 07/07/2020 CLINICAL DATA:  Shortness of breath, wheezing, history asthma, bronchitis, former smoker EXAM: PORTABLE CHEST 1 VIEW COMPARISON:  Portable exam 1407 hours compared to 03/29/2020 FINDINGS: Normal heart size, mediastinal contours, and pulmonary vascularity. Minimal chronic peribronchial thickening consistent with history of asthma and bronchitis. No acute infiltrate, pleural effusion, or pneumothorax. Osseous structures unremarkable. IMPRESSION: Minimal chronic peribronchial thickening consistent with history of asthma and bronchitis. No acute infiltrate. Electronically Signed   By: Ulyses Southward M.D.   On: 07/07/2020 14:30    ____________________________________________   PROCEDURES Procedures  ____________________________________________    CLINICAL IMPRESSION / ASSESSMENT AND PLAN / ED COURSE  Medications ordered in the ED: Medications  ipratropium-albuterol (DUONEB) 0.5-2.5 (3) MG/3ML nebulizer solution 3 mL (has no administration in time range)  albuterol (PROVENTIL) (2.5 MG/3ML) 0.083% nebulizer solution 5 mg (has no administration in time range)  diphenhydrAMINE (BENADRYL) injection 25 mg (25 mg Intravenous Given 07/07/20 1414)  famotidine (PEPCID) IVPB 20 mg premix (0 mg Intravenous Stopped 07/07/20 1449)  methylPREDNISolone sodium succinate (SOLU-MEDROL) 125 mg/2 mL injection 125 mg (125 mg Intravenous Given 07/07/20 1414)  magnesium sulfate IVPB 2 g 50 mL (0 g Intravenous Stopped 07/07/20 1519)    Pertinent labs & imaging results that were available during my care of the patient were reviewed by me and considered in my medical decision making (see chart for details).  Dev MAYES SANGIOVANNI was evaluated in Emergency Department on 07/07/2020 for the symptoms described in the history of present illness. He was  evaluated in the context of the global COVID-19 pandemic, which necessitated consideration that the patient might be at risk for infection with the SARS-CoV-2 virus that causes COVID-19. Institutional protocols and algorithms that pertain to the evaluation of patients at risk for COVID-19 are in a state of rapid change based on information released by regulatory bodies including the CDC and federal and state organizations. These policies and algorithms were followed during the patient's care in the ED.   Patient presents with shortness of breath and wheezing, anaphylactic reaction versus asthma exacerbation possibly provoked by Xolair monoclonal antibody.  Will give additional IV antihistamines, Solu-Medrol, magnesium, bronchodilators and reassess.  Patient has had multiple admissions for asthma exacerbation in the past, may require hospitalization if symptoms do not rapidly improve.  ____________________________________________   FINAL CLINICAL IMPRESSION(S) / ED DIAGNOSES    Final diagnoses:  Allergic reaction to drug, initial encounter  Severe persistent asthma with exacerbation     ED Discharge Orders    None      Portions of this note were generated with dragon dictation software. Dictation errors may occur despite best attempts at proofreading.   Sharman Cheek, MD 07/07/20 [phone]

## 2020-07-07 NOTE — ED Triage Notes (Signed)
Patient arrives via EMS for allergic reaction. He had 3 injections in right arm of Xolar at doctors office for asthma. Patient has reddness around injection sight and wheezing per EMS.

## 2020-07-07 NOTE — ED Notes (Signed)
Inhalers requested from pharmacy.

## 2020-07-07 NOTE — Progress Notes (Signed)
Agree with the details of the procedure as noted by Tammy Parrett, NP.  C. Laura Donat Humble, MD [facility] 

## 2020-07-08 ENCOUNTER — Encounter: Payer: Self-pay | Admitting: Internal Medicine

## 2020-07-08 DIAGNOSIS — T7840XA Allergy, unspecified, initial encounter: Secondary | ICD-10-CM

## 2020-07-08 MED ORDER — PREDNISONE 10 MG PO TABS
15.0000 mg | ORAL_TABLET | Freq: Every day | ORAL | Status: DC
  Administered 2020-07-09: 15 mg via ORAL
  Filled 2020-07-08: qty 2

## 2020-07-08 NOTE — Plan of Care (Signed)
  Problem: Education: Goal: Knowledge of General Education information will improve Description: Including pain rating scale, medication(s)/side effects and non-pharmacologic comfort measures Outcome: Progressing   Problem: Health Behavior/Discharge Planning: Goal: Ability to manage health-related needs will improve Outcome: Progressing   Problem: Clinical Measurements: Goal: Respiratory complications will improve Outcome: Progressing   Problem: Clinical Measurements: Goal: Cardiovascular complication will be avoided Outcome: Progressing   Problem: Safety: Goal: Ability to remain free from injury will improve Outcome: Progressing   Problem: Skin Integrity: Goal: Risk for impaired skin integrity will decrease Outcome: Progressing   

## 2020-07-08 NOTE — TOC Progression Note (Signed)
Transition of Care [facility]) - Progression Note    Patient Details  Name: Shannon Knapp MRN: [id number] Date of Birth: 09/02/1985  [facility] CM/SW Contact  Bing Quarry, RN Phone Number: 07/08/2020, 3:09 PM  Clinical Narrative:   Checked on OBS status with provider via secure chat at 1510. Provider stated she told PA patient would be staying overnight. Gabriel Cirri RN CM          Expected Discharge Plan and Services                                                 Social Determinants of Health (SDOH) Interventions    Readmission Risk Interventions Readmission Risk Prevention Plan 03/31/2020  Transportation Screening Complete  PCP or Specialist Appt within 5-7 Days Not Complete  Not Complete comments To be done at discharge.  Home Care Screening Complete  Medication Review (RN CM) Complete  Some recent data might be hidden

## 2020-07-08 NOTE — Plan of Care (Signed)

## 2020-07-08 NOTE — Progress Notes (Signed)
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 male with medical history significant for severe asthma on chronic prednisone for at least 3 years, allergies, bronchitis, allergic bronchopulmonary aspergillosis, who presented to the hospital because of shortness of breath, cough and wheezing.  He said he recently received Xolair infusion 2 days ago.  He said he was observed for 2 hours post infusion.  He did not have any problems then.  Yesterday evening, he noticed that he was coughing more and is associated with wheezing or shortness of breath.  He vomited once last night.  He could barely sleep because of his symptoms. He used his albuterol via nebulizer but it did not really help.  Today, he was still not feeling better.  He noticed swelling/bump on his right arm.  He vomited about 3 times this morning.  He also felt that his throat was closing and his chest felt tight.  EMS was called and there was concern for allergic reaction so he was brought to the hospital for further evaluation.  He said in route to the hospital, he was given Benadryl, albuterol via nebulizer and epinephrine via nebulizer. He said he got his second Covid vaccine in December 2021. No fever, chills, abdominal pain, diarrhea, pain in the extremities, headache, loss of consciousness.   ED Course: Incidentally, he tested positive for Covid infection.  The patient was given albuterol via nebulizer, IV Benadryl, IV magnesium sulfate, IV Pepcid and IV Solu-Medrol.  Chest x-ray did not show any acute infiltrate.   allergic bronchopulmonary aspergillosis severe asthma on chronic prednisone Asthma Exacerbation ruled out --d/c IV  solumedrol --resume home prednisone 15 mg daily --cont home bronchodilators  Probable allergic reaction/anaphylaxis to omalizumab (Xolair):  --s/p IV steroids, IV Pepcid, IV mag sulfate, DuoNeb and IV Benadryl in the emergency room.   Plan: --d/c IV solumedrol --Monitor overnight to ensure resolution of allergic symptoms --Contacted pulm Dr. [name] who agreed with the plan  COVID-19 infection: He got his second dose of COVID vaccine in December 2021 he is tolerating room air.   --No need to treat at this time   DVT prophylaxis: Lovenox SQ Code Status: Full code  Family Communication:  Level of care: Med-Surg Dispo:   The patient is from: home Anticipated d/c is to: home Anticipated d/c date is: tomorrow Patient currently is not medically ready to d/c due to: Need to monitor overnight for resolution of allergic symptoms   Subjective and Interval History:  Pt reported severe coughing spells mostly at night which has been chronic.  The sensation of throat closing up has resolved.  No dyspnea.   Objective: Vitals:   07/08/20 0802 07/08/20 1102 07/08/20 1604 07/08/20 2008  BP: 122/82 125/74 112/66 (!) 137/91  Pulse: 81 93 83 90  Resp: 18 19 18    Temp: 98 F (36.7 C) 98.1 F (36.7 C) 98.2 F (36.8 C) 97.8 F (36.6 C)  TempSrc: Oral Oral Oral Oral  SpO2: 100% 97% 100% 98%  Weight:      Height:       No intake or output data in the 24 hours ending 07/08/20 2302 Filed Weights   07/07/20 1400  Weight: 75.8 kg  Examination:   Constitutional: NAD, AAOx3 HEENT: conjunctivae and lids normal, EOMI CV: No cyanosis.   RESP: normal respiratory effort, on RA Extremities: No effusions, edema in BLE SKIN: warm, dry Neuro: II - XII grossly intact.   Psych: Normal mood and affect.  Appropriate judgement and reason   Data Reviewed: I have personally reviewed following labs and imaging studies  CBC: Recent Labs  Lab 07/07/20 1407  WBC 11.1*  NEUTROABS 7.4  HGB  16.3  HCT 47.7  MCV 91.0  PLT 308   Basic Metabolic Panel: Recent Labs  Lab 07/07/20 1407  NA 138  K 3.9  CL 104  CO2 26  GLUCOSE 103*  BUN 9  CREATININE 1.04  CALCIUM 8.8*  MG 2.1   GFR: Estimated Creatinine Clearance: 104.6 mL/min (by C-G formula based on SCr of 1.04 mg/dL). Liver Function Tests: No results for input(s): AST, ALT, ALKPHOS, BILITOT, PROT, ALBUMIN in the last 168 hours. No results for input(s): LIPASE, AMYLASE in the last 168 hours. No results for input(s): AMMONIA in the last 168 hours. Coagulation Profile: No results for input(s): INR, PROTIME in the last 168 hours. Cardiac Enzymes: No results for input(s): CKTOTAL, CKMB, CKMBINDEX, TROPONINI in the last 168 hours. BNP (last 3 results) No results for input(s): PROBNP in the last 8760 hours. HbA1C: No results for input(s): HGBA1C in the last 72 hours. CBG: No results for input(s): GLUCAP in the last 168 hours. Lipid Profile: No results for input(s): CHOL, HDL, LDLCALC, TRIG, CHOLHDL, LDLDIRECT in the last 72 hours. Thyroid Function Tests: No results for input(s): TSH, T4TOTAL, FREET4, T3FREE, THYROIDAB in the last 72 hours. Anemia Panel: No results for input(s): VITAMINB12, FOLATE, FERRITIN, TIBC, IRON, RETICCTPCT in the last 72 hours. Sepsis Labs: No results for input(s): PROCALCITON, LATICACIDVEN in the last 168 hours.  Recent Results (from the past 240 hour(s))  Resp Panel by RT-PCR (Flu A&B, Covid) Nasopharyngeal Swab     Status: Abnormal   Collection Time: 07/07/20  3:57 PM   Specimen: Nasopharyngeal Swab; Nasopharyngeal(NP) swabs in vial transport medium  Result Value Ref Range Status   SARS Coronavirus 2 by RT PCR POSITIVE (A) NEGATIVE Final    Comment: RESULT CALLED TO, READ BACK BY AND VERIFIED WITH: HEATHER FISHER @1702  07/07/20 MJU (NOTE) SARS-CoV-2 target nucleic acids are DETECTED.  The SARS-CoV-2 RNA is generally detectable in upper respiratory specimens during the acute phase of  infection. Positive results are indicative of the presence of the identified virus, but do not rule out bacterial infection or co-infection with other pathogens not detected by the test. Clinical correlation with patient history and other diagnostic information is necessary to determine patient infection status. The expected result is Negative.  Fact Sheet for Patients: 07/09/20  Fact Sheet for Healthcare Providers: BloggerCourse.com  This test is not yet approved or cleared by the [place] FDA and  has been authorized for detection and/or diagnosis of SARS-CoV-2 by FDA under an Emergency Use Authorization (EUA).  This EUA will remain in effect (meaning this test can be  used) for the duration of  the COVID-19 declaration under Section 564(b)(1) of the Act, 21 U.S.C. section 360bbb-3(b)(1), unless the authorization is terminated or revoked sooner.     Influenza A by PCR NEGATIVE NEGATIVE Final   Influenza B by PCR NEGATIVE NEGATIVE Final    Comment: (NOTE) The Xpert Xpress SARS-CoV-2/FLU/RSV plus assay is intended as an aid in the diagnosis of influenza from Nasopharyngeal swab specimens and should not be  used as a sole basis for treatment. Nasal washings and aspirates are unacceptable for Xpert Xpress SARS-CoV-2/FLU/RSV testing.  Fact Sheet for Patients: BloggerCourse.com  Fact Sheet for Healthcare Providers: SeriousBroker.it  This test is not yet approved or cleared by the [place] FDA and has been authorized for detection and/or diagnosis of SARS-CoV-2 by FDA under an Emergency Use Authorization (EUA). This EUA will remain in effect (meaning this test can be used) for the duration of the COVID-19 declaration under Section 564(b)(1) of the Act, 21 U.S.C. section 360bbb-3(b)(1), unless the authorization is terminated or revoked.  Performed at [facility], [address] [place], [place] [address]       Radiology Studies: DG Chest Portable 1 View  Result Date: 07/07/2020 CLINICAL DATA:  Shortness of breath, wheezing, history asthma, bronchitis, former smoker EXAM: PORTABLE CHEST 1 VIEW COMPARISON:  Portable exam 1407 hours compared to 03/29/2020 FINDINGS: Normal heart size, mediastinal contours, and pulmonary vascularity. Minimal chronic peribronchial thickening consistent with history of asthma and bronchitis. No acute infiltrate, pleural effusion, or pneumothorax. Osseous structures unremarkable. IMPRESSION: Minimal chronic peribronchial thickening consistent with history of asthma and bronchitis. No acute infiltrate. Electronically Signed   By: Ulyses Southward M.D.   On: 07/07/2020 14:30     Scheduled Meds: . enoxaparin (LOVENOX) injection  40 mg Subcutaneous Q24H  . Ipratropium-Albuterol  1 puff Inhalation QID  . loratadine  10 mg Oral Daily  . mometasone-formoterol  2 puff Inhalation BID  . montelukast  10 mg Oral QHS  . predniSONE  15 mg Oral Q breakfast   Continuous Infusions:   LOS: 0 days     Darlin Priestly, MD [facility] If 7PM-7AM, please contact night-coverage 07/08/2020, 11:02 PM

## 2020-07-09 DIAGNOSIS — U071 COVID-19: Secondary | ICD-10-CM

## 2020-07-09 LAB — BASIC METABOLIC PANEL
Anion gap: 7 (ref 5–15)
BUN: 12 mg/dL (ref 6–20)
CO2: 28 mmol/L (ref 22–32)
Calcium: 9.1 mg/dL (ref 8.9–10.3)
Chloride: 101 mmol/L (ref 98–111)
Creatinine, Ser: 0.88 mg/dL (ref 0.61–1.24)
GFR, Estimated: >60 mL/min (ref >60)
Glucose, Bld: 107 mg/dL — ABNORMAL HIGH (ref 70–99)
Potassium: 4.3 mmol/L (ref 3.5–5.1)
Sodium: 136 mmol/L (ref 135–145)

## 2020-07-09 LAB — CBC
HCT: 44.9 % (ref 39.0–52.0)
Hemoglobin: 15.4 g/dL (ref 13.0–17.0)
MCH: 31.4 pg (ref 26.0–34.0)
MCHC: 34.3 g/dL (ref 30.0–36.0)
MCV: 91.4 fL (ref 80.0–100.0)
Platelets: 290 10*3/uL (ref 150–400)
RBC: 4.91 MIL/uL (ref 4.22–5.81)
RDW: 13.7 % (ref 11.5–15.5)
WBC: 15.8 10*3/uL — ABNORMAL HIGH (ref 4.0–10.5)
nRBC: 0 % (ref 0.0–0.2)

## 2020-07-09 LAB — MAGNESIUM: Magnesium: 2.3 mg/dL (ref 1.7–2.4)

## 2020-07-09 MED ORDER — SPIRIVA RESPIMAT 1.25 MCG/ACT IN AERS: 2.0000 | INHALATION_SPRAY | Freq: Every day | RESPIRATORY_TRACT | Status: DC

## 2020-07-09 NOTE — Plan of Care (Signed)

## 2020-07-09 NOTE — TOC Transition Note (Addendum)
Transition of Care [facility]) - CM/SW Discharge Note   Patient Details  Name: Shannon Knapp MRN: [id number] Date of Birth: 1985/06/04  [facility]) CM/SW Contact:  Bing Quarry, RN Phone Number: 07/09/2020, 9:51 AM   Clinical Narrative:   Pt. Is being discharged today per provider to Home/Self Care. Patient is Covid + on admission. No TOC needs identified/ordered. Gabriel Cirri RN CM    Final next level of care: Home/Self Care Barriers to Discharge: Barriers Resolved   Patient Goals and CMS Choice     Choice offered to / list presented to : NA  Discharge Placement                       Discharge Plan and Services                DME Arranged: N/A DME Agency: NA       HH Arranged: NA HH Agency: NA        Social Determinants of Health (SDOH) Interventions     Readmission Risk Interventions Readmission Risk Prevention Plan 03/31/2020  Transportation Screening Complete  PCP or Specialist Appt within 5-7 Days Not Complete  Not Complete comments To be done at discharge.  Home Care Screening Complete  Medication Review (RN CM) Complete  Some recent data might be hidden

## 2020-07-09 NOTE — Discharge Summary (Signed)
Physician Discharge Summary   Shannon Knapp  male DOB: 1986-01-12  IOX:[id number]  PCP: [name]  Admit date: 07/07/2020 Discharge date: 07/09/2020  Admitted From: home Disposition:  home CODE STATUS: Full code   Hospital Course:  For full details, please see H&P, progress notes, consult notes and ancillary notes.  Briefly,  Shannon M Mitchellis a 35 y.o.malewith medical history significant for severe asthma on chronic prednisone for at least 3 years, bronchitis, allergic bronchopulmonary aspergillosis, who presented to the hospital because of shortness of breath, cough and wheezing.   He said he recently received Xolair infusion 2 days prior to presentation. He said he was observed for 2 hours post infusion. He did not have any problems then. Next evening, he noticed that he was coughing more and is associated with wheezing or shortness of breath. He used his albuterol via nebulizer but it did not really help. Day of presentation, he was still not feeling better. He noticed swelling/bump on his right arm. He vomited about 3 times due to coughing too hard. He also felt that his throat was closing and his chest felt tight. EMS was called and there was concern for allergic reaction so he was brought to the hospital for further evaluation. He said in route to the hospital, he was given Benadryl, albuterol via nebulizer and epinephrine via nebulizer. He said he got his second Covid vaccine in December 2021.  Probable allergic reaction/anaphylaxisto omalizumab (Xolair):  Pt received IV solumedrol 125 mg, IV Pepcid, IV mag sulfate, DuoNeb and IV Benadryl in the emergency room.  IV solumedrol was continued at 80 mg q6h, which was d/c'ed the day prior to discharge to ensure allergic symptoms have resolved.  I contacted pulm Dr. [name] who agreed with the plan.    allergic bronchopulmonary aspergillosis severe asthma on chronic prednisone Asthma  Exacerbation ruled out IV solumedrol d/c'ed and pt resumed on home prednisone 15 mg daily.  Home bronchodilators continued as Combivent.  Home asthma and allergy regimen continued.  COVID-19 infection:  No hypoxic, CXR no acute finding.  No need to treat at this time.   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 [address] [employer]. Has patient had a PCN reaction causing immediate rash, facial/tongue/throat swelling, SOB or lightheadedness with hypotension: Yes Has patient had a PCN reaction causing severe rash involving mucus membranes or skin necrosis: No Has patient had a PCN reaction that required hospitalization: No Has patient had a PCN reaction occurring within the last 10 years: Yes If all of the above answers are "NO", then may proceed with Cephalosporin use.   Omalizumab Anaphylaxis   Doxycycline    Fish Allergy Swelling   Shellfish Allergy Swelling   Penicillins Hives   Has patient had a PCN reaction causing immediate rash, facial/tongue/throat swelling, SOB or lightheadedness with hypotension: Yes Has patient had a PCN reaction causing severe rash involving mucus membranes or skin necrosis: No Has patient had a PCN reaction that required hospitalization: No Has patient had a PCN reaction occurring within the last 10 years: Yes If all of the above answers are "NO", then may proceed with Cephalosporin use.      Medication List  TAKE these medications   albuterol 108 (90 Base) MCG/ACT inhaler Commonly known as: VENTOLIN HFA Inhale 2 puffs into the lungs every 6 (six) hours as needed for shortness of breath.   albuterol 1.25 MG/3ML nebulizer solution Commonly known as: ACCUNEB Take 3 mLs by nebulization every  6 (six) hours as needed for wheezing.   budesonide-formoterol 160-4.5 MCG/ACT inhaler Commonly known as: Symbicort Inhale 2 puffs into the lungs 2 (two) times daily.   cetirizine 10 MG tablet Commonly known as: ZYRTEC Take 1 tablet (10 mg total) by mouth daily.   EPINEPHrine 0.3 mg/0.3 mL Soaj injection Commonly known as: EPI-PEN Inject 0.3 mg into the muscle as needed for anaphylaxis.   fluticasone 27.5 MCG/SPRAY nasal spray Commonly known as: VERAMYST Place 2 sprays into the nose daily.   fluticasone 50 MCG/ACT nasal spray Commonly known as: FLONASE Place 2 sprays into both nostrils daily.   guaiFENesin 200 MG tablet Take 400 mg by mouth every 4 (four) hours as needed for cough or to loosen phlegm.   montelukast 10 MG tablet Commonly known as: SINGULAIR Take 1 tablet (10 mg total) by mouth at bedtime.   omeprazole 40 MG capsule Commonly known as: PRILOSEC Take 1 capsule (40 mg total) by mouth daily.   predniSONE 10 MG tablet Commonly known as: DELTASONE Take 1.5 tablets (15 mg total) by mouth daily with breakfast.   Spiriva Respimat 1.25 MCG/ACT Aers Generic drug: Tiotropium Bromide Monohydrate Inhale 2 puffs into the lungs daily. Dr. Jayme CloudGonzalez says to do 1 puff BID - this his how pt taking it        Follow-up Information    [facility]. Schedule an appointment as soon as possible for a visit in 1 week(s).   Specialty: General Practice Contact information: [phone] [place] [address] [phone]        Salena SanerGonzalez, Carmen L, MD. Schedule an appointment as soon as possible for a visit in 1 week(s).   Specialty: Pulmonary Disease Contact information: [address] [place] [address] [phone]               Allergies  Allergen Reactions  . Amoxicillin Hives    Hives all over the body. [employer] Has patient had a PCN reaction causing immediate rash,  facial/tongue/throat swelling, SOB or lightheadedness with hypotension: Yes Has patient had a PCN reaction causing severe rash involving mucus membranes or skin necrosis: No Has patient had a PCN reaction that required hospitalization: No Has patient had a PCN reaction occurring within the last 10 years: Yes If all of the above answers are "NO", then may proceed with Cephalosporin use.  . Omalizumab Anaphylaxis  . Doxycycline   . Fish Allergy Swelling  . Shellfish Allergy Swelling  . Penicillins Hives    Has patient had a PCN reaction causing immediate rash, facial/tongue/throat swelling, SOB or lightheadedness with hypotension: Yes Has patient had a PCN reaction causing severe rash involving mucus membranes or skin necrosis: No Has patient had a PCN reaction that required hospitalization: No Has patient had a PCN reaction occurring within the last 10 years: Yes If all of the above answers are "NO", then may proceed with Cephalosporin use.     The results of significant diagnostics from this hospitalization (including imaging, microbiology, ancillary and laboratory) are listed below for reference.   Consultations:   Procedures/Studies: DG Chest Portable 1 View  Result Date: 07/07/2020 CLINICAL DATA:  Shortness  of breath, wheezing, history asthma, bronchitis, former smoker EXAM: PORTABLE CHEST 1 VIEW COMPARISON:  Portable exam 1407 hours compared to 03/29/2020 FINDINGS: Normal heart size, mediastinal contours, and pulmonary vascularity. Minimal chronic peribronchial thickening consistent with history of asthma and bronchitis. No acute infiltrate, pleural effusion, or pneumothorax. Osseous structures unremarkable. IMPRESSION: Minimal chronic peribronchial thickening consistent with history of asthma and bronchitis. No acute infiltrate. Electronically Signed   By: Ulyses Southward M.D.   On: 07/07/2020 14:30      Labs: BNP (last 3 results) No results for input(s): BNP in the last 8760  hours. Basic Metabolic Panel: Recent Labs  Lab 07/07/20 1407 07/09/20 0518  NA 138 136  K 3.9 4.3  CL 104 101  CO2 26 28  GLUCOSE 103* 107*  BUN 9 12  CREATININE 1.04 0.88  CALCIUM 8.8* 9.1  MG 2.1 2.3   Liver Function Tests: No results for input(s): AST, ALT, ALKPHOS, BILITOT, PROT, ALBUMIN in the last 168 hours. No results for input(s): LIPASE, AMYLASE in the last 168 hours. No results for input(s): AMMONIA in the last 168 hours. CBC: Recent Labs  Lab 07/07/20 1407 07/09/20 0518  WBC 11.1* 15.8*  NEUTROABS 7.4  --   HGB 16.3 15.4  HCT 47.7 44.9  MCV 91.0 91.4  PLT 308 290   Cardiac Enzymes: No results for input(s): CKTOTAL, CKMB, CKMBINDEX, TROPONINI in the last 168 hours. BNP: Invalid input(s): POCBNP CBG: No results for input(s): GLUCAP in the last 168 hours. D-Dimer No results for input(s): DDIMER in the last 72 hours. Hgb A1c No results for input(s): HGBA1C in the last 72 hours. Lipid Profile No results for input(s): CHOL, HDL, LDLCALC, TRIG, CHOLHDL, LDLDIRECT in the last 72 hours. Thyroid function studies No results for input(s): TSH, T4TOTAL, T3FREE, THYROIDAB in the last 72 hours.  Invalid input(s): FREET3 Anemia work up No results for input(s): VITAMINB12, FOLATE, FERRITIN, TIBC, IRON, RETICCTPCT in the last 72 hours. Urinalysis    Component Value Date/Time   COLORURINE YELLOW (A) 03/30/2020 0414   APPEARANCEUR HAZY (A) 03/30/2020 0414   LABSPEC 1.027 03/30/2020 0414   PHURINE 5.0 03/30/2020 0414   GLUCOSEU NEGATIVE 03/30/2020 0414   HGBUR NEGATIVE 03/30/2020 0414   BILIRUBINUR NEGATIVE 03/30/2020 0414   KETONESUR 20 (A) 03/30/2020 0414   PROTEINUR 30 (A) 03/30/2020 0414   NITRITE NEGATIVE 03/30/2020 0414   LEUKOCYTESUR NEGATIVE 03/30/2020 0414   Sepsis Labs Invalid input(s): PROCALCITONIN,  WBC,  LACTICIDVEN Microbiology Recent Results (from the past 240 hour(s))  Resp Panel by RT-PCR (Flu A&B, Covid) Nasopharyngeal Swab     Status:  Abnormal   Collection Time: 07/07/20  3:57 PM   Specimen: Nasopharyngeal Swab; Nasopharyngeal(NP) swabs in vial transport medium  Result Value Ref Range Status   SARS Coronavirus 2 by RT PCR POSITIVE (A) NEGATIVE Final    Comment: RESULT CALLED TO, READ BACK BY AND VERIFIED WITH: HEATHER FISHER @1702  07/07/20 MJU (NOTE) SARS-CoV-2 target nucleic acids are DETECTED.  The SARS-CoV-2 RNA is generally detectable in upper respiratory specimens during the acute phase of infection. Positive results are indicative of the presence of the identified virus, but do not rule out bacterial infection or co-infection with other pathogens not detected by the test. Clinical correlation with patient history and other diagnostic information is necessary to determine patient infection status. The expected result is Negative.  Fact Sheet for Patients: 07/09/20  Fact Sheet for Healthcare Providers: BloggerCourse.com  This test is not yet approved or cleared by the  [place] [employer] and  has been authorized for detection and/or diagnosis of SARS-CoV-2 by FDA under an TEFL teacher (EUA).  This EUA will remain in effect (meaning this test can be  used) for the duration of  the COVID-19 declaration under Section 564(b)(1) of the Act, 21 U.S.C. section 360bbb-3(b)(1), unless the authorization is terminated or revoked sooner.     Influenza A by PCR NEGATIVE NEGATIVE Final   Influenza B by PCR NEGATIVE NEGATIVE Final    Comment: (NOTE) The Xpert Xpress SARS-CoV-2/FLU/RSV plus assay is intended as an aid in the diagnosis of influenza from Nasopharyngeal swab specimens and should not be used as a sole basis for treatment. Nasal washings and aspirates are unacceptable for Xpert Xpress SARS-CoV-2/FLU/RSV testing.  Fact Sheet for Patients: BloggerCourse.com  Fact Sheet for Healthcare  Providers: SeriousBroker.it  This test is not yet approved or cleared by the [place] FDA and has been authorized for detection and/or diagnosis of SARS-CoV-2 by FDA under an Emergency Use Authorization (EUA). This EUA will remain in effect (meaning this test can be used) for the duration of the COVID-19 declaration under Section 564(b)(1) of the Act, 21 U.S.C. section 360bbb-3(b)(1), unless the authorization is terminated or revoked.  Performed at [facility], [address] [place], [place] [address]      Total time spend on discharging this patient, including the last patient exam, discussing the hospital stay, instructions for ongoing care as it relates to all pertinent caregivers, as well as preparing the medical discharge records, prescriptions, and/or referrals as applicable, is 35 minutes.    Darlin Priestly, MD  [facility] 07/09/2020, 9:20 AM

## 2020-07-09 NOTE — Progress Notes (Signed)
Patient discharging home. Instructions given to patient, verbalized understanding. IV removed.   Friend to come pick up patient

## 2020-07-14 ENCOUNTER — Telehealth: Payer: Self-pay | Admitting: Pulmonary Disease

## 2020-07-14 MED ORDER — PREDNISONE 10 MG PO TABS: 15.0000 mg | ORAL_TABLET | Freq: Every day | ORAL | 0 refills | Status: DC

## 2020-07-14 NOTE — Telephone Encounter (Signed)
Rx for prednisone 15mg  has been sent to preferred pharmacy.  Patient is aware and voiced her understanding.  Nothing further needed.

## 2020-07-18 ENCOUNTER — Telehealth: Payer: Self-pay

## 2020-07-18 NOTE — Telephone Encounter (Signed)
Patient admitted on 07/07/2020 and dx with covid during admission.   Patient is scheduled for OV on 07/20/2020.  Per Dr. [name] verbally- recommend doing phone visit vs in office due to recent dx of covid.   Lm for patient.

## 2020-07-19 NOTE — Telephone Encounter (Signed)
Lm x2 for patient. Will attempt to call once more due to nature of call.   

## 2020-07-19 NOTE — Telephone Encounter (Signed)
Called and spoke with Patient. Xolair injection scheduled for 07/20/20 at 0915 cancelled. Patient aware of 07/20/20 OV with Dr. [name] and her recommendations for televisit. Understanding stated.  Advised Patient we will reschedule Xolair if Dr. [name] recommends.

## 2020-07-20 ENCOUNTER — Telehealth: Payer: Self-pay

## 2020-07-20 ENCOUNTER — Ambulatory Visit: Payer: Medicaid Other

## 2020-07-20 ENCOUNTER — Ambulatory Visit (INDEPENDENT_AMBULATORY_CARE_PROVIDER_SITE_OTHER): Payer: Self-pay | Admitting: Pulmonary Disease

## 2020-07-20 ENCOUNTER — Other Ambulatory Visit: Payer: Self-pay

## 2020-07-20 DIAGNOSIS — U071 COVID-19: Secondary | ICD-10-CM

## 2020-07-20 DIAGNOSIS — J4550 Severe persistent asthma, uncomplicated: Secondary | ICD-10-CM

## 2020-07-20 DIAGNOSIS — B4481 Allergic bronchopulmonary aspergillosis: Secondary | ICD-10-CM

## 2020-07-20 NOTE — Progress Notes (Unsigned)
Subjective:    Patient ID: Shannon Knapp, male    DOB: 1985-06-10, 35 y.o.   MRN: [id number] Virtual Visit Via Video or Telephone Note:   This visit type was conducted due to national recommendations for restrictions regarding the COVID-19 pandemic .  This format is felt to be most appropriate for this patient at this time.  All issues noted in this document were discussed and addressed.  No physical exam was performed (except for noted visual exam findings with Video Visits).  Patient requiring remote visit due to recent diagnosis of COVID-19 and still on quarantine status.   I connected with [name] by telephone at 3:50 PM and verified that I was speaking with the correct person using two identifiers. Location patient: home Location provider: [facility] Persons participating in the virtual visit: patient, physician   I discussed the limitations, risks, security and privacy concerns of performing an evaluation and management service by telephone and the availability of in person appointments. The patient expressed understanding and agreed to proceed.  HPI Shannon Knapp is a 35 year old smoker (quit 08/2018) with a history of severe persistent asthma and ABPA.  Recently started on Xolair.  The patient had first Xolair injection on 05 July 2020.  Subsequently on 18 February he noted that he had sore throat and that it felt like his throat was "closing in".  Still having issues with shortness of breath cough and wheezing.  The cough is for the most part nonproductive and "annoying".  He also had vomiting the night prior to admission.  Albuterol did not really help.  He presented to the emergency room via EMS concerned with regards to a potential Xolair reaction.  However on evaluation at the ED the patient was noted to be COVID-19 positive.  I discussed the case with the admitting physician and his impression was that he had symptoms more consistent with mild COVID  consistent with his vaccinated status.  It did not appear that this was related to Xolair.  However on discussion with Izan today, he would like to switch the agent for control of his asthma.  We discussed switching him to Dupixent.  The patient was discharged from the hospital on 20 February.  Since his discharge he has been doing markedly better.  His symptoms of sore throat have slowly improved.  He is on prednisone 15 mg daily, Symbicort 160/4.5, 2 inhalations twice a day, Spiriva Respimat, Prilosec, Singulair and Zyrtec.  He takes albuterol as needed.  Since his discharge he has not required rescue inhaler.  He voices no other complaint today.  Review of Systems A 10 point review of systems was performed and it is as noted above otherwise negative.  Medications have been reviewed with the patient.    Objective:   Physical Exam  No physical exam performed as the visit was via telephone.  Patient did not exhibit conversational dyspnea during the visit.      Assessment & Plan:     ICD-10-CM   1. Severe persistent asthma, unspecified whether complicated  J45.50    Due to concern for potential reaction to Xolair patient would prefer switching agents We will switch to Dupixent Doubt his symptoms were related to medication  2. ABPA (allergic bronchopulmonary aspergillosis) (HCC)  B44.81    Continue prednisone He completed itraconazole Decrease prednisone as allowed   3. COVID-19 virus infection  U07.1    Suspect that the patient's symptoms were likely related to COVID-19 Positive COVID-19 07 July 2020  Meds ordered this encounter  Medications  . budesonide-formoterol (SYMBICORT) 160-4.5 MCG/ACT inhaler    Sig: Inhale 2 puffs into the lungs 2 (two) times daily.    Dispense:  1 each    Refill:  6   Discussion:  At the patient's request we will discontinue Xolair, have sent patient paperwork to start application for Dupixent.  Continue other medications as they are.   Refill for Symbicort sent to medication management clinic.  We will see the patient in follow-up in 4 to 6 weeks time he is to contact us prior to that time should any new difficulties arise.   Total visit time 20 minutes, nonface-to-face time.   Gailen Shelter, MD [facility]   *This note was dictated using voice recognition software/Dragon.  Despite best efforts to proofread, errors can occur which can change the meaning.  Any change was purely unintentional.

## 2020-07-20 NOTE — Patient Instructions (Signed)
We will sign new forms for Dupixent  We will stop Xolair

## 2020-07-20 NOTE — Telephone Encounter (Signed)
dupixent myway forms have been placed in outgoing mail. Patient will completed and return to our office.

## 2020-07-21 ENCOUNTER — Encounter: Payer: Self-pay | Admitting: Pulmonary Disease

## 2020-07-21 ENCOUNTER — Other Ambulatory Visit: Payer: Self-pay | Admitting: Pulmonary Disease

## 2020-07-21 MED ORDER — BUDESONIDE-FORMOTEROL FUMARATE 160-4.5 MCG/ACT IN AERO: 2.0000 | INHALATION_SPRAY | Freq: Two times a day (BID) | RESPIRATORY_TRACT | 6 refills | Status: DC

## 2020-07-27 ENCOUNTER — Telehealth: Payer: Self-pay | Admitting: Pharmacist

## 2020-07-27 NOTE — Telephone Encounter (Signed)
07/27/2020 10:10:32 AM - Symbicort call to [facility] & script to dr  -- Rhetta Mura - Thursday, July 27, 2020 10:06 AM --  Received note from [facility] to check on Symbicort-they have filled samples for patient a few times- Called [place] spoke with Margo-after reviewing and speaking with pharmacy she was told--The maximum number of inhalers for a 90 day supply is 3--They will need another script showing quantity of inhalers as 3 and fax to them with Patient ID# [id number]. Sending Interoffice to provider to sign & return to me. The script/application we submitted had quantity of 4.

## 2020-07-31 ENCOUNTER — Telehealth: Payer: Self-pay | Admitting: Pulmonary Disease

## 2020-07-31 NOTE — Telephone Encounter (Signed)
Rx for Symbicort has been sent to medication management clinic via interoffice.

## 2020-08-02 ENCOUNTER — Telehealth: Payer: Self-pay | Admitting: Pharmacist

## 2020-08-02 NOTE — Telephone Encounter (Signed)
08/02/2020 2:36:28 PM - [facility]ymbicort script to [facility] for refill  -- Rhetta Mura - Wednesday, August 02, 2020 2:35 PM -- [facility]axed script to [facility] for refill on Symbicort, ID# [id number].

## 2020-08-16 ENCOUNTER — Other Ambulatory Visit: Payer: Self-pay | Admitting: Pulmonary Disease

## 2020-08-16 ENCOUNTER — Telehealth: Payer: Self-pay | Admitting: Pulmonary Disease

## 2020-08-16 DIAGNOSIS — J4550 Severe persistent asthma, uncomplicated: Secondary | ICD-10-CM

## 2020-08-16 MED ORDER — MONTELUKAST SODIUM 10 MG PO TABS: 10.0000 mg | ORAL_TABLET | Freq: Every day | ORAL | 2 refills | Status: DC

## 2020-08-16 MED ORDER — PREDNISONE 10 MG PO TABS: 15.0000 mg | ORAL_TABLET | Freq: Every day | ORAL | 0 refills | Status: DC

## 2020-08-16 NOTE — Telephone Encounter (Signed)
Rx for prednisone and Singulair has been sent to preferred pharmacy.  Patient is aware and voiced his understanding.  Nothing further needed at this time.

## 2020-08-21 ENCOUNTER — Other Ambulatory Visit: Payer: Self-pay

## 2020-08-28 ENCOUNTER — Other Ambulatory Visit: Payer: Self-pay

## 2020-08-28 ENCOUNTER — Emergency Department
Admission: EM | Admit: 2020-08-28 | Discharge: 2020-08-28 | Disposition: A | Discharge status: Home / Self Care | Payer: Self-pay | Attending: Emergency Medicine | Admitting: Emergency Medicine

## 2020-08-28 ENCOUNTER — Encounter: Payer: Self-pay | Admitting: Emergency Medicine

## 2020-08-28 ENCOUNTER — Emergency Department: Payer: Self-pay

## 2020-08-28 DIAGNOSIS — Z7951 Long term (current) use of inhaled steroids: Secondary | ICD-10-CM | POA: Insufficient documentation

## 2020-08-28 DIAGNOSIS — Z8616 Personal history of COVID-19: Secondary | ICD-10-CM | POA: Insufficient documentation

## 2020-08-28 DIAGNOSIS — Z87891 Personal history of nicotine dependence: Secondary | ICD-10-CM | POA: Insufficient documentation

## 2020-08-28 DIAGNOSIS — J4521 Mild intermittent asthma with (acute) exacerbation: Secondary | ICD-10-CM | POA: Insufficient documentation

## 2020-08-28 DIAGNOSIS — R059 Cough, unspecified: Secondary | ICD-10-CM

## 2020-08-28 MED ORDER — BENZONATATE 100 MG PO CAPS
200.0000 mg | ORAL_CAPSULE | Freq: Three times a day (TID) | ORAL | 0 refills | Status: DC | PRN
  Filled 2020-08-28: qty 30, 5d supply, fill #0

## 2020-08-28 MED ORDER — METHYLPREDNISOLONE SODIUM SUCC 125 MG IJ SOLR
125.0000 mg | Freq: Once | INTRAMUSCULAR | Status: AC
  Administered 2020-08-28: 125 mg via INTRAMUSCULAR
  Filled 2020-08-28: qty 2

## 2020-08-28 MED ORDER — IPRATROPIUM-ALBUTEROL 0.5-2.5 (3) MG/3ML IN SOLN
3.0000 mL | Freq: Once | RESPIRATORY_TRACT | Status: AC
  Administered 2020-08-28: 3 mL via RESPIRATORY_TRACT
  Filled 2020-08-28: qty 3

## 2020-08-28 MED ORDER — METHYLPREDNISOLONE 4 MG PO TBPK
ORAL_TABLET | ORAL | 0 refills | Status: DC
  Filled 2020-08-28: qty 21, 6d supply, fill #0

## 2020-08-28 NOTE — ED Triage Notes (Signed)
Patient to ER for continued wheezing and cough since last Thursday. Patient states he has been taking nebs and inhaler as directed, but has not been able to get relief.

## 2020-08-28 NOTE — ED Provider Notes (Signed)
[facility] Emergency Department Provider Note   ____________________________________________   Event Date/Time   First MD Initiated Contact with Patient 08/28/20 1135     (approximate)  I have reviewed the triage vital signs and the nursing notes.   HISTORY  Chief Complaint Wheezing    HPI Shannon Knapp is a 35 y.o. male patient presents with persistent wheezing and cough for 4 days.  Patient has a history of asthma and states he complaint is refractory to home neb treatments and inhaler.         Past Medical History:  Diagnosis Date  . Allergic bronchopulmonary aspergillosis (HCC)   . Asthma   . Chronic bronchitis [facility])     Patient Active Problem List   Diagnosis Date Noted  . Acute asthma exacerbation 07/07/2020  . COVID-19 virus infection 07/07/2020  . Allergic reaction 07/07/2020  . Anaphylaxis after allergen immunotherapy 07/07/2020  . GERD (gastroesophageal reflux disease) 05/03/2020  . 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 Community acquired pneumonia of left lower lobe of lung 10/25/2019  . Medication management 04/30/2019  . Healthcare maintenance 11/26/2018  . Severe persistent asthma 05/28/2018  . Acute febrile illness 04/10/2018  . Upper airway cough syndrome   . Hypoxemia   . Syncope   . Allergic rhinitis   . Cough   . Hemoptysis   . Protein-calorie malnutrition, severe 10/20/2017  . Acute respiratory failure with hypoxemia (HCC) 10/20/2017  . Acute respiratory distress   . Asthma exacerbation   . Status asthmaticus 10/18/2017  . Severe persistent acute asthmatic bronchitis 10/17/2017  . Elevated blood pressure reading without diagnosis of hypertension 10/17/2017  . Respiratory distress 10/17/2017    Past Surgical History:  Procedure Laterality Date  . VIDEO BRONCHOSCOPY Bilateral 10/24/2017   Procedure: VIDEO BRONCHOSCOPY WITHOUT  FLUORO;  Surgeon: Alyson Reedy, MD;  Location: [facility] ENDOSCOPY;  Service: Cardiopulmonary;  Laterality: Bilateral;    Prior to Admission medications   Medication Sig Start Date End Date Taking? Authorizing Provider  benzonatate (TESSALON PERLES) 100 MG capsule Take 2 capsules (200 mg total) by mouth 3 (three) times daily as needed. 08/28/20 08/28/21 Yes Joni Reining, PA-C  methylPREDNISolone (MEDROL DOSEPAK) 4 MG TBPK tablet Take Tapered dose as directed 08/28/20  Yes Joni Reining, PA-C  albuterol (ACCUNEB) 1.25 MG/3ML nebulizer solution Take 3 mLs by nebulization every 6 (six) hours as needed for wheezing. 04/10/20   [provider]  albuterol (VENTOLIN HFA) 108 (90 Base) MCG/ACT inhaler INHALE 2 PUFFS INTO THE LUNGS EVERY 6 HOURS AS NEEDED FOR SHORTNESS OF BREATH 01/07/20 01/06/21  Parrett, Virgel Bouquet, NP  budesonide-formoterol (SYMBICORT) 160-4.5 MCG/ACT inhaler INHALE 2 PUFFS INTO THE LUNGS 2 TIMES A DAY 07/21/20 07/21/21  Salena Saner, MD  cetirizine (ZYRTEC) 10 MG tablet TAKE ONE TABLET BY MOUTH EVERY DAY 04/02/20 04/02/21  Briant Cedar, MD  EPINEPHrine 0.3 mg/0.3 mL IJ SOAJ injection INJECT 0.3MG  INTO THE MUSCLE AS NEEDED FOR ANAPHYLAXIS 04/02/20 04/02/21  Briant Cedar, MD  fluticasone (FLONASE) 50 MCG/ACT nasal spray Place 2 sprays into both nostrils daily. 04/26/20   [provider]  fluticasone (FLONASE) 50 MCG/ACT nasal spray SPRAY 2 SPRAYS INTO EACH NOSTRIL ONCE DAILY FOR ALLERGY 04/26/20 04/26/21  Oswaldo Conroy, MD  fluticasone (VERAMYST) 27.5 MCG/SPRAY nasal spray Place 2 sprays into the nose daily.    [provider]  montelukast (SINGULAIR) 10 MG tablet TAKE ONE TABLET  BY MOUTH AT BEDTIME 08/16/20 08/16/21  Salena Saner, MD  omeprazole (PRILOSEC) 20 MG capsule TAKE 2 CAPSULES(40MG  TOTAL) BY MOUTH EVERY DAY 04/25/20 01/17/21  Glenford Bayley, NP  omeprazole (PRILOSEC) 40 MG capsule Take 1 capsule (40 mg total) by mouth daily. 04/25/20    Glenford Bayley, NP  Tiotropium Bromide Monohydrate (SPIRIVA RESPIMAT) 1.25 MCG/ACT AERS Inhale 2 puffs into the lungs daily. Dr. [name] says to do 1 puff BID - this his how pt taking it 07/09/20 08/08/20  Darlin Priestly, MD    Allergies Amoxicillin, Omalizumab, Doxycycline, Fish allergy, Shellfish allergy, and Penicillins  Family History  Problem Relation Age of Onset  . Hypertension Mother   . Diabetes Mother   . Asthma Father   . Hypertension Father     Social History Social History   Tobacco Use  . Smoking status: Former Smoker    Packs/day: 1.00    Years: 14.00    Pack years: 14.00    Types: Cigarettes    Start date: 2006    Quit date: 08/2018    Years since quitting: 2.0  . Smokeless tobacco: Never Used  Vaping Use  . Vaping Use: Never used  Substance Use Topics  . Alcohol use: Not Currently    Alcohol/week: 15.0 standard drinks    Types: 15 Cans of beer per week  . Drug use: No    Review of Systems Constitutional: No fever/chills Eyes: No visual changes. ENT: No sore throat. Cardiovascular: Denies chest pain. Respiratory: Denies shortness of breath.  Wheezing and cough. Gastrointestinal: No abdominal pain.  No nausea, no vomiting.  No diarrhea.  No constipation. Genitourinary: Negative for dysuria. Musculoskeletal: Negative for back pain. Skin: Negative for rash. Neurological: Negative for headaches, focal weakness or numbness. Allergic/Immunilogical: Amoxicillin, doxycycline, fish and shellfish, and penicillin. ____________________________________________   PHYSICAL EXAM:  VITAL SIGNS: ED Triage Vitals  Enc Vitals Group     BP 08/28/20 1110 (!) 146/88     Pulse Rate 08/28/20 1110 95     Resp 08/28/20 1110 20     Temp 08/28/20 1110 98.4 F (36.9 C)     Temp Source 08/28/20 1110 Oral     SpO2 08/28/20 1110 96 %     Weight 08/28/20 1111 176 lb (79.8 kg)     Height 08/28/20 1111 5\' 11"  (1.803 m)     Head Circumference --      Peak Flow --       Pain Score 08/28/20 1110 9     Pain Loc --      Pain Edu? --      Excl. in GC? --    Constitutional: Alert and oriented. Well appearing and in no acute distress. Nose: No congestion/rhinnorhea. Mouth/Throat: Mucous membranes are moist.  Oropharynx non-erythematous. Neck: No stridor.  Hematological/Lymphatic/Immunilogical: No cervical lymphadenopathy. Cardiovascular: Normal rate, regular rhythm. Grossly normal heart sounds.  Good peripheral circulation. Respiratory: Normal respiratory effort.  No retractions. Lungs inspiratory/ expiratory wheezing. Gastrointestinal: Soft and nontender. No distention. No abdominal bruits. No CVA tenderness. Musculoskeletal: No lower extremity tenderness nor edema.  No joint effusions. Neurologic:  Normal speech and language. No gross focal neurologic deficits are appreciated. No gait instability. Skin:  Skin is warm, dry and intact. No rash noted. Psychiatric: Mood and affect are normal. Speech and behavior are normal.  ____________________________________________   LABS (all labs ordered are listed, but only abnormal results are displayed)  Labs Reviewed - No data to display ____________________________________________  EKG  ____________________________________________  RADIOLOGY Margarite Gouge, personally viewed and evaluated these images (plain radiographs) as part of my medical decision making, as well as reviewing the written report by the radiologist.  ED MD interpretation: No acute findings on chest x-ray.  Official radiology report(s): DG Chest 2 View  Result Date: 08/28/2020 CLINICAL DATA:  Wheezing, cough. EXAM: CHEST - 2 VIEW COMPARISON:  July 07, 2020. FINDINGS: The heart size and mediastinal contours are within normal limits. Both lungs are clear. The visualized skeletal structures are unremarkable. IMPRESSION: No active cardiopulmonary disease. Electronically Signed   By: Lupita Raider M.D.   On: 08/28/2020 12:15     ____________________________________________   PROCEDURES  Procedure(s) performed (including Critical Care):  Procedures   ____________________________________________   INITIAL IMPRESSION / ASSESSMENT AND PLAN / ED COURSE  As part of my medical decision making, I reviewed the following data within the electronic MEDICAL RECORD NUMBER         Patient presents with increased inspiratory and expiratory wheezing refractory to inhalers today.  Chest x-ray was unremarkable for bronchitis or pneumonia.  Patient responded well to 2 nebulized treatments.  Patient given Solu-Medrol IM and a prescription for Medrol Dosepak.  Patient also given a prescription for Tessalon Perles.  Advised to follow-up with pulmonologist.      ____________________________________________   FINAL CLINICAL IMPRESSION(S) / ED DIAGNOSES  Final diagnoses:  Mild intermittent asthma with exacerbation  Cough     ED Discharge Orders         Ordered    methylPREDNISolone (MEDROL DOSEPAK) 4 MG TBPK tablet        08/28/20 1337    benzonatate (TESSALON PERLES) 100 MG capsule  3 times daily PRN        08/28/20 1337          *Please note:  Shannon Knapp was evaluated in Emergency Department on 08/28/2020 for the symptoms described in the history of present illness. He was evaluated in the context of the global COVID-19 pandemic, which necessitated consideration that the patient might be at risk for infection with the SARS-CoV-2 virus that causes COVID-19. Institutional protocols and algorithms that pertain to the evaluation of patients at risk for COVID-19 are in a state of rapid change based on information released by regulatory bodies including the CDC and federal and state organizations. These policies and algorithms were followed during the patient's care in the ED.  Some ED evaluations and interventions may be delayed as a result of limited staffing during and the pandemic.*   Note:  This document  was prepared using Dragon voice recognition software and may include unintentional dictation errors.    Joni Reining, PA-C 08/28/20 1347    Dionne Bucy, MD 08/28/20 [id number]

## 2020-08-28 NOTE — Discharge Instructions (Addendum)
No acute findings on chest x-ray.  Continue previous medications and start steroids and Tessalon as directed.  Advised to follow-up pulmonologist and medication management.

## 2020-08-29 ENCOUNTER — Other Ambulatory Visit: Payer: Self-pay

## 2020-08-29 ENCOUNTER — Inpatient Hospital Stay
Admission: EM | Admit: 2020-08-29 | Discharge: 2020-08-31 | DRG: 202 | Disposition: A | Discharge status: Home / Self Care | Payer: Medicaid Other | Attending: Internal Medicine | Admitting: Internal Medicine

## 2020-08-29 ENCOUNTER — Emergency Department: Payer: Medicaid Other

## 2020-08-29 ENCOUNTER — Encounter: Payer: Self-pay | Admitting: Emergency Medicine

## 2020-08-29 DIAGNOSIS — J4552 Severe persistent asthma with status asthmaticus: Secondary | ICD-10-CM | POA: Diagnosis present

## 2020-08-29 DIAGNOSIS — Z91013 Allergy to seafood: Secondary | ICD-10-CM

## 2020-08-29 DIAGNOSIS — Z8249 Family history of ischemic heart disease and other diseases of the circulatory system: Secondary | ICD-10-CM

## 2020-08-29 DIAGNOSIS — B348 Other viral infections of unspecified site: Secondary | ICD-10-CM

## 2020-08-29 DIAGNOSIS — J4542 Moderate persistent asthma with status asthmaticus: Secondary | ICD-10-CM

## 2020-08-29 DIAGNOSIS — J4551 Severe persistent asthma with (acute) exacerbation: Principal | ICD-10-CM | POA: Diagnosis present

## 2020-08-29 DIAGNOSIS — Z7951 Long term (current) use of inhaled steroids: Secondary | ICD-10-CM

## 2020-08-29 DIAGNOSIS — B4481 Allergic bronchopulmonary aspergillosis: Secondary | ICD-10-CM | POA: Diagnosis present

## 2020-08-29 DIAGNOSIS — E860 Dehydration: Secondary | ICD-10-CM

## 2020-08-29 DIAGNOSIS — Z8616 Personal history of COVID-19: Secondary | ICD-10-CM

## 2020-08-29 DIAGNOSIS — J45901 Unspecified asthma with (acute) exacerbation: Secondary | ICD-10-CM | POA: Diagnosis present

## 2020-08-29 DIAGNOSIS — Z20822 Contact with and (suspected) exposure to covid-19: Secondary | ICD-10-CM | POA: Diagnosis present

## 2020-08-29 DIAGNOSIS — R634 Abnormal weight loss: Secondary | ICD-10-CM

## 2020-08-29 DIAGNOSIS — Z7952 Long term (current) use of systemic steroids: Secondary | ICD-10-CM

## 2020-08-29 DIAGNOSIS — J42 Unspecified chronic bronchitis: Secondary | ICD-10-CM | POA: Diagnosis present

## 2020-08-29 DIAGNOSIS — M2550 Pain in unspecified joint: Secondary | ICD-10-CM | POA: Diagnosis present

## 2020-08-29 DIAGNOSIS — R531 Weakness: Secondary | ICD-10-CM

## 2020-08-29 DIAGNOSIS — Z79899 Other long term (current) drug therapy: Secondary | ICD-10-CM

## 2020-08-29 DIAGNOSIS — Z87891 Personal history of nicotine dependence: Secondary | ICD-10-CM

## 2020-08-29 DIAGNOSIS — Z833 Family history of diabetes mellitus: Secondary | ICD-10-CM

## 2020-08-29 DIAGNOSIS — R112 Nausea with vomiting, unspecified: Secondary | ICD-10-CM

## 2020-08-29 DIAGNOSIS — K219 Gastro-esophageal reflux disease without esophagitis: Secondary | ICD-10-CM | POA: Diagnosis present

## 2020-08-29 DIAGNOSIS — J309 Allergic rhinitis, unspecified: Secondary | ICD-10-CM | POA: Diagnosis present

## 2020-08-29 DIAGNOSIS — R197 Diarrhea, unspecified: Secondary | ICD-10-CM

## 2020-08-29 DIAGNOSIS — A084 Viral intestinal infection, unspecified: Secondary | ICD-10-CM | POA: Diagnosis present

## 2020-08-29 DIAGNOSIS — Z880 Allergy status to penicillin: Secondary | ICD-10-CM

## 2020-08-29 DIAGNOSIS — B9789 Other viral agents as the cause of diseases classified elsewhere: Secondary | ICD-10-CM | POA: Diagnosis present

## 2020-08-29 DIAGNOSIS — Z825 Family history of asthma and other chronic lower respiratory diseases: Secondary | ICD-10-CM

## 2020-08-29 LAB — CBC WITH DIFFERENTIAL/PLATELET
Abs Immature Granulocytes: 0.02 10*3/uL (ref 0.00–0.07)
Basophils Absolute: 0 10*3/uL (ref 0.0–0.1)
Basophils Relative: 0 %
Eosinophils Absolute: 1.6 10*3/uL — ABNORMAL HIGH (ref 0.0–0.5)
Eosinophils Relative: 19 %
HCT: 49.7 % (ref 39.0–52.0)
Hemoglobin: 16.7 g/dL (ref 13.0–17.0)
Immature Granulocytes: 0 %
Lymphocytes Relative: 12 %
Lymphs Abs: 1 10*3/uL (ref 0.7–4.0)
MCH: 31.2 pg (ref 26.0–34.0)
MCHC: 33.6 g/dL (ref 30.0–36.0)
MCV: 92.7 fL (ref 80.0–100.0)
Monocytes Absolute: 0.5 10*3/uL (ref 0.1–1.0)
Monocytes Relative: 5 %
Neutro Abs: 5.3 10*3/uL (ref 1.7–7.7)
Neutrophils Relative %: 64 %
Platelets: 302 10*3/uL (ref 150–400)
RBC: 5.36 MIL/uL (ref 4.22–5.81)
RDW: 13.5 % (ref 11.5–15.5)
WBC: 8.4 10*3/uL (ref 4.0–10.5)
nRBC: 0 % (ref 0.0–0.2)

## 2020-08-29 LAB — COMPREHENSIVE METABOLIC PANEL
ALT: 33 U/L (ref 0–44)
AST: 21 U/L (ref 15–41)
Albumin: 3.8 g/dL (ref 3.5–5.0)
Alkaline Phosphatase: 50 U/L (ref 38–126)
Anion gap: 9 (ref 5–15)
BUN: 12 mg/dL (ref 6–20)
CO2: 27 mmol/L (ref 22–32)
Calcium: 8.8 mg/dL — ABNORMAL LOW (ref 8.9–10.3)
Chloride: 104 mmol/L (ref 98–111)
Creatinine, Ser: 0.92 mg/dL (ref 0.61–1.24)
GFR, Estimated: >60 mL/min (ref >60)
Glucose, Bld: 115 mg/dL — ABNORMAL HIGH (ref 70–99)
Potassium: 4.7 mmol/L (ref 3.5–5.1)
Sodium: 140 mmol/L (ref 135–145)
Total Bilirubin: 0.8 mg/dL (ref 0.3–1.2)
Total Protein: 7.1 g/dL (ref 6.5–8.1)

## 2020-08-29 LAB — POC SARS CORONAVIRUS 2 AG -  ED: SARS Coronavirus 2 Ag: NEGATIVE

## 2020-08-29 MED ORDER — EPINEPHRINE 0.3 MG/0.3ML IJ SOAJ
0.3000 mg | Freq: Once | INTRAMUSCULAR | Status: DC | PRN
  Filled 2020-08-29: qty 0.6

## 2020-08-29 MED ORDER — PANTOPRAZOLE SODIUM 40 MG PO TBEC
40.0000 mg | DELAYED_RELEASE_TABLET | Freq: Every day | ORAL | Status: DC
  Administered 2020-08-30 – 2020-08-31 (×2): 40 mg via ORAL
  Filled 2020-08-29 (×2): qty 1

## 2020-08-29 MED ORDER — SODIUM CHLORIDE 0.9% FLUSH
3.0000 mL | Freq: Two times a day (BID) | INTRAVENOUS | Status: DC
  Administered 2020-08-30 – 2020-08-31 (×2): 3 mL via INTRAVENOUS

## 2020-08-29 MED ORDER — IPRATROPIUM-ALBUTEROL 0.5-2.5 (3) MG/3ML IN SOLN
3.0000 mL | Freq: Four times a day (QID) | RESPIRATORY_TRACT | Status: DC
  Administered 2020-08-30 – 2020-08-31 (×6): 3 mL via RESPIRATORY_TRACT
  Filled 2020-08-29 (×6): qty 3

## 2020-08-29 MED ORDER — MONTELUKAST SODIUM 10 MG PO TABS
10.0000 mg | ORAL_TABLET | Freq: Every day | ORAL | Status: DC
  Administered 2020-08-30: 10 mg via ORAL
  Filled 2020-08-29: qty 1

## 2020-08-29 MED ORDER — MOMETASONE FURO-FORMOTEROL FUM 200-5 MCG/ACT IN AERO
2.0000 | INHALATION_SPRAY | Freq: Two times a day (BID) | RESPIRATORY_TRACT | Status: DC
  Administered 2020-08-30 – 2020-08-31 (×4): 2 via RESPIRATORY_TRACT
  Filled 2020-08-29: qty 8.8

## 2020-08-29 MED ORDER — IPRATROPIUM-ALBUTEROL 0.5-2.5 (3) MG/3ML IN SOLN
3.0000 mL | Freq: Once | RESPIRATORY_TRACT | Status: AC
  Administered 2020-08-29: 3 mL via RESPIRATORY_TRACT
  Filled 2020-08-29: qty 3

## 2020-08-29 MED ORDER — PREDNISONE 50 MG PO TABS: 50.0000 mg | ORAL_TABLET | Freq: Every day | ORAL | Status: DC

## 2020-08-29 MED ORDER — ENOXAPARIN SODIUM 40 MG/0.4ML ~~LOC~~ SOLN
40.0000 mg | SUBCUTANEOUS | Status: DC
  Administered 2020-08-29 – 2020-08-30 (×2): 40 mg via SUBCUTANEOUS
  Filled 2020-08-29 (×2): qty 0.4

## 2020-08-29 MED ORDER — PROMETHAZINE HCL 25 MG/ML IJ SOLN
12.5000 mg | INTRAMUSCULAR | Status: AC
  Administered 2020-08-29: 12.5 mg via INTRAMUSCULAR
  Filled 2020-08-29: qty 1

## 2020-08-29 MED ORDER — LORATADINE 10 MG PO TABS
10.0000 mg | ORAL_TABLET | Freq: Every day | ORAL | Status: DC
  Administered 2020-08-30 – 2020-08-31 (×2): 10 mg via ORAL
  Filled 2020-08-29 (×2): qty 1

## 2020-08-29 MED ORDER — SODIUM CHLORIDE 0.9 % IV BOLUS
1000.0000 mL | Freq: Once | INTRAVENOUS | Status: AC
  Administered 2020-08-29: 1000 mL via INTRAVENOUS

## 2020-08-29 MED ORDER — ONDANSETRON HCL 4 MG/2ML IJ SOLN
4.0000 mg | Freq: Four times a day (QID) | INTRAMUSCULAR | Status: DC | PRN
  Administered 2020-08-30: 4 mg via INTRAVENOUS
  Filled 2020-08-29: qty 2

## 2020-08-29 MED ORDER — ACETAMINOPHEN 650 MG RE SUPP: 650.0000 mg | Freq: Four times a day (QID) | RECTAL | Status: DC | PRN

## 2020-08-29 MED ORDER — METHYLPREDNISOLONE SODIUM SUCC 125 MG IJ SOLR: 125.0000 mg | INTRAMUSCULAR | Status: DC

## 2020-08-29 MED ORDER — LEVALBUTEROL HCL 1.25 MG/0.5ML IN NEBU
1.2500 mg | INHALATION_SOLUTION | Freq: Once | RESPIRATORY_TRACT | Status: AC
  Administered 2020-08-29: 1.25 mg via RESPIRATORY_TRACT
  Filled 2020-08-29: qty 0.5

## 2020-08-29 MED ORDER — MAGNESIUM SULFATE IN D5W 1-5 GM/100ML-% IV SOLN
1.0000 g | Freq: Once | INTRAVENOUS | Status: AC
  Administered 2020-08-29: 1 g via INTRAVENOUS
  Filled 2020-08-29: qty 100

## 2020-08-29 MED ORDER — ONDANSETRON HCL 4 MG/2ML IJ SOLN
4.0000 mg | INTRAMUSCULAR | Status: AC
  Administered 2020-08-29: 4 mg via INTRAVENOUS
  Filled 2020-08-29: qty 2

## 2020-08-29 MED ORDER — ALBUTEROL SULFATE (2.5 MG/3ML) 0.083% IN NEBU
2.5000 mg | INHALATION_SOLUTION | RESPIRATORY_TRACT | Status: DC | PRN
  Administered 2020-08-29: 2.5 mg via RESPIRATORY_TRACT
  Filled 2020-08-29: qty 3

## 2020-08-29 MED ORDER — ALBUTEROL SULFATE HFA 108 (90 BASE) MCG/ACT IN AERS
2.0000 | INHALATION_SPRAY | Freq: Four times a day (QID) | RESPIRATORY_TRACT | Status: DC
  Administered 2020-08-29 – 2020-08-30 (×3): 2 via RESPIRATORY_TRACT
  Filled 2020-08-29: qty 6.7

## 2020-08-29 MED ORDER — FLUTICASONE PROPIONATE 50 MCG/ACT NA SUSP
2.0000 | Freq: Every day | NASAL | Status: DC
  Filled 2020-08-29: qty 16

## 2020-08-29 MED ORDER — SODIUM CHLORIDE 0.9 % IV SOLN: INTRAVENOUS | Status: DC

## 2020-08-29 MED ORDER — ACETAMINOPHEN 325 MG PO TABS
650.0000 mg | ORAL_TABLET | Freq: Four times a day (QID) | ORAL | Status: DC | PRN
  Administered 2020-08-29: 650 mg via ORAL
  Filled 2020-08-29: qty 2

## 2020-08-29 NOTE — Telephone Encounter (Signed)
Noted  

## 2020-08-29 NOTE — ED Triage Notes (Signed)
Presents via EMS with weakness and n/v  States sx's started during the night  Denies any fever

## 2020-08-29 NOTE — ED Notes (Signed)
Family in with pt  States he feels a little better  Cont's to have some wheezing

## 2020-08-29 NOTE — ED Notes (Signed)
Pt with expiratory wheezing, see MAR

## 2020-08-29 NOTE — ED Provider Notes (Signed)
[facility] Emergency Department Provider Note   ____________________________________________   Event Date/Time   First MD Initiated Contact with Patient 08/29/20 1324     (approximate)  I have reviewed the triage vital signs and the nursing notes.   HISTORY  Chief Complaint Weakness    HPI Shannon Knapp is a 35 y.o. male with a history of asthma and chronic bronchitis  Patient presents, reports that yesterday began wheezing having shortness of breath.  Was seen at the ER started on treatment but was unable to get a steroid prescription filled because the pharmacy did not have it.  He discontinued using inhaler but he started having shortness of breath again today and now having nausea vomiting and diarrhea.  Reports he vomited multiple times earlier today very yellow watery.  Does have abdominal pain and vomiting but when at rest he is not having any pain.  No chest pain.  Continues to wheeze and feels short of breath as well.  Feels very weak and lightheaded to the point he had to have the ambulance come to bring him to the ER today   Past Medical History:  Diagnosis Date  . Allergic bronchopulmonary aspergillosis (HCC)   . Asthma   . Chronic bronchitis [facility])     Patient Active Problem List   Diagnosis Date Noted  . Asthma in adult, severe persistent, with acute exacerbation 08/29/2020  . Acute asthma exacerbation 07/07/2020  . COVID-19 virus infection 07/07/2020  . Allergic reaction 07/07/2020  . Anaphylaxis after allergen immunotherapy 07/07/2020  . GERD (gastroesophageal reflux disease) 05/03/2020  . 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 Community acquired pneumonia of left lower lobe of lung 10/25/2019  . Medication management 04/30/2019  . Healthcare maintenance 11/26/2018  . Severe persistent asthma 05/28/2018  . Acute febrile illness 04/10/2018  . Upper  airway cough syndrome   . Hypoxemia   . Syncope   . Allergic rhinitis   . Cough   . Hemoptysis   . Protein-calorie malnutrition, severe 10/20/2017  . Acute respiratory failure with hypoxemia (HCC) 10/20/2017  . Acute respiratory distress   . Asthma exacerbation   . Status asthmaticus 10/18/2017  . Severe persistent acute asthmatic bronchitis 10/17/2017  . Elevated blood pressure reading without diagnosis of hypertension 10/17/2017  . Respiratory distress 10/17/2017    Past Surgical History:  Procedure Laterality Date  . VIDEO BRONCHOSCOPY Bilateral 10/24/2017   Procedure: VIDEO BRONCHOSCOPY WITHOUT FLUORO;  Surgeon: Alyson Reedy, MD;  Location: [facility] ENDOSCOPY;  Service: Cardiopulmonary;  Laterality: Bilateral;    Prior to Admission medications   Medication Sig Start Date End Date Taking? Authorizing Provider  albuterol (ACCUNEB) 1.25 MG/3ML nebulizer solution Take 3 mLs by nebulization every 6 (six) hours as needed for wheezing. 04/10/20   [provider]  albuterol (VENTOLIN HFA) 108 (90 Base) MCG/ACT inhaler INHALE 2 PUFFS INTO THE LUNGS EVERY 6 HOURS AS NEEDED FOR SHORTNESS OF BREATH 01/07/20 01/06/21  Parrett, Virgel Bouquet, NP  benzonatate (TESSALON PERLES) 100 MG capsule Take 2 capsules (200 mg total) by mouth 3 (three) times daily as needed. 08/28/20 08/28/21  Joni Reining, PA-C  budesonide-formoterol (SYMBICORT) 160-4.5 MCG/ACT inhaler INHALE 2 PUFFS INTO THE LUNGS 2 TIMES A DAY 07/21/20 07/21/21  Salena Saner, MD  cetirizine (ZYRTEC) 10 MG tablet TAKE ONE TABLET BY MOUTH EVERY DAY 04/02/20 04/02/21  Briant Cedar, MD  EPINEPHrine 0.3 mg/0.3 mL IJ SOAJ injection INJECT  0.3MG  INTO THE MUSCLE AS NEEDED FOR ANAPHYLAXIS 04/02/20 04/02/21  Briant CedarEzenduka, Nkeiruka J, MD  fluticasone (FLONASE) 50 MCG/ACT nasal spray Place 2 sprays into both nostrils daily. 04/26/20   [provider]  fluticasone (FLONASE) 50 MCG/ACT nasal spray SPRAY 2 SPRAYS INTO EACH NOSTRIL ONCE DAILY  FOR ALLERGY 04/26/20 04/26/21  Oswaldo ConroyBender, Abby Daneele, MD  fluticasone (VERAMYST) 27.5 MCG/SPRAY nasal spray Place 2 sprays into the nose daily.    [provider]  methylPREDNISolone (MEDROL DOSEPAK) 4 MG TBPK tablet Take Tapered dose as directed 08/28/20   Joni ReiningSmith, Ronald K, PA-C  montelukast (SINGULAIR) 10 MG tablet TAKE ONE TABLET BY MOUTH AT BEDTIME 08/16/20 08/16/21  Salena SanerGonzalez, Carmen L, MD  omeprazole (PRILOSEC) 20 MG capsule TAKE 2 CAPSULES(40MG  TOTAL) BY MOUTH EVERY DAY 04/25/20 01/17/21  Glenford BayleyWalsh, Elizabeth W, NP  omeprazole (PRILOSEC) 40 MG capsule Take 1 capsule (40 mg total) by mouth daily. 04/25/20   Glenford BayleyWalsh, Elizabeth W, NP  Tiotropium Bromide Monohydrate (SPIRIVA RESPIMAT) 1.25 MCG/ACT AERS Inhale 2 puffs into the lungs daily. Dr. Jayme CloudGonzalez says to do 1 puff BID - this his how pt taking it 07/09/20 08/08/20  Darlin PriestlyLai, Tina, MD    Allergies Amoxicillin, Omalizumab, Doxycycline, Fish allergy, Shellfish allergy, and Penicillins  Family History  Problem Relation Age of Onset  . Hypertension Mother   . Diabetes Mother   . Asthma Father   . Hypertension Father     Social History Social History   Tobacco Use  . Smoking status: Former Smoker    Packs/day: 1.00    Years: 14.00    Pack years: 14.00    Types: Cigarettes    Start date: 2006    Quit date: 08/2018    Years since quitting: 2.0  . Smokeless tobacco: Never Used  Vaping Use  . Vaping Use: Never used  Substance Use Topics  . Alcohol use: Not Currently    Alcohol/week: 15.0 standard drinks    Types: 15 Cans of beer per week  . Drug use: No    Review of Systems Constitutional: No fever/chills Eyes: No visual changes. ENT: No sore throat. Cardiovascular: Denies chest pain. Respiratory: Denies shortness of breath. Gastrointestinal: No abdominal pain.   Genitourinary: Negative for dysuria. Musculoskeletal: Negative for back pain. Skin: Negative for rash. Neurological: Negative for headaches, areas of focal weakness or  numbness.    ____________________________________________   PHYSICAL EXAM:  VITAL SIGNS: ED Triage Vitals  Enc Vitals Group     BP 08/29/20 1242 117/77     Pulse Rate 08/29/20 1242 (!) 109     Resp 08/29/20 1242 20     Temp 08/29/20 1242 98.2 F (36.8 C)     Temp Source 08/29/20 1242 Oral     SpO2 08/29/20 1242 94 %     Weight 08/29/20 1243 175 lb 14.8 oz (79.8 kg)     Height 08/29/20 1243 5\' 11"  (1.803 m)     Head Circumference --      Peak Flow --      Pain Score 08/29/20 1242 2     Pain Loc --      Pain Edu? --      Excl. in GC? --     Constitutional: Alert and oriented.  Moderately ill-appearing, appears very fatigued but in no acute distress or extremitas. Eyes: Conjunctivae are normal. Head: Atraumatic. Nose: No congestion/rhinnorhea. Mouth/Throat: Mucous membranes are dry, appears nauseated. Neck: No stridor.  Cardiovascular: Slightly tachycardic rate, regular rhythm. Grossly normal heart sounds.  Good peripheral circulation. Respiratory: Mild accessory muscle use and very mild tachypnea.  No hypoxia.  Does have diffuse and expiratory wheezing, central rhonchorous lung sounds as well. Gastrointestinal: Soft and nontender. No distention. Musculoskeletal: No lower extremity tenderness nor edema. Neurologic:  Normal speech and language. No gross focal neurologic deficits are appreciated.  Skin:  Skin is warm, dry and intact. No rash noted. Psychiatric: Mood and affect are normal. Speech and behavior are normal.  ____________________________________________   LABS (all labs ordered are listed, but only abnormal results are displayed)  Labs Reviewed  CBC WITH DIFFERENTIAL/PLATELET - Abnormal; Notable for the following components:      Result Value   Eosinophils Absolute 1.6 (*)    All other components within normal limits  COMPREHENSIVE METABOLIC PANEL - Abnormal; Notable for the following components:   Glucose, Bld 115 (*)    Calcium 8.8 (*)    All other  components within normal limits  SARS CORONAVIRUS 2 (TAT 6-24 HRS)  BASIC METABOLIC PANEL  CBC  POC SARS CORONAVIRUS 2 AG -  ED   ____________________________________________  EKG   ____________________________________________  RADIOLOGY  DG Chest Portable 1 View  Result Date: 08/29/2020 CLINICAL DATA:  Dyspnea, cough, weakness, nausea, vomiting, onset of symptoms overnight, history asthma, former smoker EXAM: PORTABLE CHEST 1 VIEW COMPARISON:  Portable exam 1339 hours compared to 08/28/2020 FINDINGS: Normal heart size, mediastinal contours, and pulmonary vascularity. Minimal peribronchial thickening, chronic, may be related history of asthma. No acute infiltrate, pleural effusion, or pneumothorax. Osseous structures unremarkable. IMPRESSION: No acute abnormalities. Electronically Signed   By: Ulyses Southward M.D.   On: 08/29/2020 14:21    Chest x-ray negative for acute finding ____________________________________________   PROCEDURES  Procedure(s) performed:   Procedures  Critical Care performed: No  ____________________________________________   INITIAL IMPRESSION / ASSESSMENT AND PLAN / ED COURSE  Pertinent labs & imaging results that were available during my care of the patient were reviewed by me and considered in my medical decision making (see chart for details).   Patient presents for nausea vomiting in the setting of recent cough and upper respiratory symptoms including asthma type exacerbation.  Does have a history of chronic bronchitis as well as asthma.  Patient with evidence of asthma exacerbation, chest x-ray negative for infiltrate or pneumonia.  Will treat with traditional treatments for asthma, plan to rehydrate.  Abdominal exam very reassuring nontender nonfocal exam, however does report significant nausea and vomiting.  Do not feel high suspicion for acute intra-abdominal pathology, clinical setting seems most indicative of likely self-limited GI or viral  illness.  Patient will be admitted for further care and serial evaluation as well. ----------------------------------------- 7:21 PM on 08/29/2020 -----------------------------------------  Patient showing evidence of improvement on frequent reassessments, but continues to feel quite nauseated, and was just recently dry heaving.  Does continue to exhibit some wheezing on vital signs are improving and overall status appears to be improving, but given relatively intractable nature of nausea as well as status asthmaticus will admit for further care and management.  Discussed with Dr. Alinda Money  Patient and his fiance understand agreeable with plan for admission       ____________________________________________   FINAL CLINICAL IMPRESSION(S) / ED DIAGNOSES  Final diagnoses:  Dehydration  Generalized weakness  Moderate persistent asthma with status asthmaticus        Note:  This document was prepared using Dragon voice recognition software and may include unintentional dictation errors       Sharyn Creamer,  MD 08/29/20 1923

## 2020-08-29 NOTE — ED Notes (Signed)
Patient ambulating with assistance from girlfriend to bathroom.

## 2020-08-29 NOTE — ED Notes (Signed)
Pt used call bell, pt found standing at end of bed, stating he needed to use the bathroom. Pt walked to bathroom, gait steady. Pt told to use call bell when finished to be walked back to room

## 2020-08-29 NOTE — Telephone Encounter (Signed)
Shannon Saner, MD  Rosaland Lao, CMA Can we check to see where we are on Dupixent for [name]?   I called patient for update on forms and patient's spouse answered and stated that she would call back, as she is currently on the phone with 911. Will leave encounter open to ensure follow up.

## 2020-08-29 NOTE — H&P (Signed)
History and Physical   Shannon Knapp SAY:[id number] DOB: 09-14-1985 DOA: 08/29/2020  PCP: [name]   Patient coming from: Home  Chief Complaint: Shortness of breath, nausea, vomiting, diarrhea  HPI: Shannon Knapp is a 35 y.o. male with medical history significant of allergic bronchopulmonary aspergillosis, asthma, allergies, GERD who presents with ongoing shortness of breath and new onset nausea vomiting diarrhea.   Patient was seen in the ED yesterday for cough and wheezing not responding to home inhalers and diagnosed with asthma exacerbation he was treated with nebs and steroids and discharged with steroid taper.  He was unable to pick up his steroid taper as his pharmacy did not have it.  He states that starting last night he began to have nausea, vomiting, diarrhea.  He also began to have wheezing and shortness of breath again this morning.  He returned to the ED due to the persistence of the symptoms.  He also reports some chest pain associated with his shortness of breath from working to breathe, crampy abdominal pain associated with his diarrhea, and some body aches.  He denies fever but does report sensation of chills at home.  His significant other states that he did feel warm.  ED Course: Vital signs in ED significant for heart rate in the 90s to 100s, intermittently require 2 L.  Vital signs otherwise stable.  Lab work-up showed CMP with glucose 115, calcium 8.8.  CBC within normal limits.  Respiratory panel for flu and COVID pending.  Chest x-ray without acute abnormalities.  Patient received 1 L IV fluids, dose of Solu-Medrol, magnesium, do duo nebs, albuterol neb, Zofran, Phenergan in the ED.  Despite this he remained symptomatic with nausea abdominal pain and wheezing.  Review of Systems: As per HPI otherwise all other systems reviewed and are negative.  Past Medical History:  Diagnosis Date  . Allergic bronchopulmonary aspergillosis (HCC)    . Asthma   . Chronic bronchitis (HCC)     Past Surgical History:  Procedure Laterality Date  . VIDEO BRONCHOSCOPY Bilateral 10/24/2017   Procedure: VIDEO BRONCHOSCOPY WITHOUT FLUORO;  Surgeon: Alyson Reedy, MD;  Location: [facility] ENDOSCOPY;  Service: Cardiopulmonary;  Laterality: Bilateral;    Social History  reports that he quit smoking about 2 years ago. His smoking use included cigarettes. He started smoking about 16 years ago. He has a 14.00 pack-year smoking history. He has never used smokeless tobacco. He reports previous alcohol use of about 15.0 standard drinks of alcohol per week. He reports that he does not use drugs.  Allergies  Allergen Reactions  . Amoxicillin Hives    Hives all over the body. 2009 [facility] [employer]. Has patient had a PCN reaction causing immediate rash, facial/tongue/throat swelling, SOB or lightheadedness with hypotension: Yes Has patient had a PCN reaction causing severe rash involving mucus membranes or skin necrosis: No Has patient had a PCN reaction that required hospitalization: No Has patient had a PCN reaction occurring within the last 10 years: Yes If all of the above answers are "NO", then may proceed with Cephalosporin use.  . Omalizumab Anaphylaxis  . Doxycycline   . Fish Allergy Swelling  . Shellfish Allergy Swelling  . Penicillins Hives    Has patient had a PCN reaction causing immediate rash, facial/tongue/throat swelling, SOB or lightheadedness with hypotension: Yes Has patient had a PCN reaction causing severe rash involving mucus membranes or skin necrosis: No Has patient had a PCN reaction that required hospitalization: No Has  patient had a PCN reaction occurring within the last 10 years: Yes If all of the above answers are "NO", then may proceed with Cephalosporin use.    Family History  Problem Relation Age of Onset  . Hypertension Mother   . Diabetes Mother   . Asthma Father   . Hypertension Father   Reviewed on  admission  Prior to Admission medications   Medication Sig Start Date End Date Taking? Authorizing Provider  albuterol (ACCUNEB) 1.25 MG/3ML nebulizer solution Take 3 mLs by nebulization every 6 (six) hours as needed for wheezing. 04/10/20   [provider]  albuterol (VENTOLIN HFA) 108 (90 Base) MCG/ACT inhaler INHALE 2 PUFFS INTO THE LUNGS EVERY 6 HOURS AS NEEDED FOR SHORTNESS OF BREATH 01/07/20 01/06/21  Parrett, Virgel Bouquet, NP  benzonatate (TESSALON PERLES) 100 MG capsule Take 2 capsules (200 mg total) by mouth 3 (three) times daily as needed. 08/28/20 08/28/21  Joni Reining, PA-C  budesonide-formoterol (SYMBICORT) 160-4.5 MCG/ACT inhaler INHALE 2 PUFFS INTO THE LUNGS 2 TIMES A DAY 07/21/20 07/21/21  Salena Saner, MD  cetirizine (ZYRTEC) 10 MG tablet TAKE ONE TABLET BY MOUTH EVERY DAY 04/02/20 04/02/21  Briant Cedar, MD  EPINEPHrine 0.3 mg/0.3 mL IJ SOAJ injection INJECT 0.3MG  INTO THE MUSCLE AS NEEDED FOR ANAPHYLAXIS 04/02/20 04/02/21  Briant Cedar, MD  fluticasone (FLONASE) 50 MCG/ACT nasal spray Place 2 sprays into both nostrils daily. 04/26/20   [provider]  fluticasone (FLONASE) 50 MCG/ACT nasal spray SPRAY 2 SPRAYS INTO EACH NOSTRIL ONCE DAILY FOR ALLERGY 04/26/20 04/26/21  Oswaldo Conroy, MD  fluticasone (VERAMYST) 27.5 MCG/SPRAY nasal spray Place 2 sprays into the nose daily.    [provider]  methylPREDNISolone (MEDROL DOSEPAK) 4 MG TBPK tablet Take Tapered dose as directed 08/28/20   Joni Reining, PA-C  montelukast (SINGULAIR) 10 MG tablet TAKE ONE TABLET BY MOUTH AT BEDTIME 08/16/20 08/16/21  Salena Saner, MD  omeprazole (PRILOSEC) 20 MG capsule TAKE 2 CAPSULES(40MG  TOTAL) BY MOUTH EVERY DAY 04/25/20 01/17/21  Glenford Bayley, NP  omeprazole (PRILOSEC) 40 MG capsule Take 1 capsule (40 mg total) by mouth daily. 04/25/20   Glenford Bayley, NP  Tiotropium Bromide Monohydrate (SPIRIVA RESPIMAT) 1.25 MCG/ACT AERS Inhale 2 puffs  into the lungs daily. Dr. [name] says to do 1 puff BID - this his how pt taking it 07/09/20 08/08/20  Darlin Priestly, MD    Physical Exam: Vitals:   08/29/20 1242 08/29/20 1243 08/29/20 1551 08/29/20 1810  BP: 117/77  125/77 120/80  Pulse: (!) 109  100 (!) 104  Resp: 20  18 18   Temp: 98.2 F (36.8 C)     TempSrc: Oral     SpO2: 94%  96% 94%  Weight:  79.8 kg    Height:  5\' 11"  (1.803 m)     Physical Exam Constitutional:      Appearance: He is ill-appearing.     Comments: Young male in mild discomfort.  Tired appearing.  HENT:     Head: Normocephalic and atraumatic.     Mouth/Throat:     Mouth: Mucous membranes are moist.     Pharynx: Oropharynx is clear.  Eyes:     Extraocular Movements: Extraocular movements intact.     Pupils: Pupils are equal, round, and reactive to light.  Cardiovascular:     Rate and Rhythm: Normal rate and regular rhythm.     Pulses: Normal pulses.     Heart sounds: Normal heart  sounds.  Pulmonary:     Effort: Pulmonary effort is normal. No respiratory distress.     Breath sounds: Wheezing present.  Abdominal:     General: Bowel sounds are normal. There is no distension.     Palpations: Abdomen is soft.     Tenderness: There is abdominal tenderness (Generalized).  Musculoskeletal:        General: No swelling or deformity.  Skin:    General: Skin is warm and dry.  Neurological:     General: No focal deficit present.     Mental Status: Mental status is at baseline.    Labs on Admission: I have personally reviewed following labs and imaging studies  CBC: Recent Labs  Lab 08/29/20 1304  WBC 8.4  NEUTROABS 5.3  HGB 16.7  HCT 49.7  MCV 92.7  PLT 302    Basic Metabolic Panel: Recent Labs  Lab 08/29/20 1304  NA 140  K 4.7  CL 104  CO2 27  GLUCOSE 115*  BUN 12  CREATININE 0.92  CALCIUM 8.8*    GFR: Estimated Creatinine Clearance: 118.2 mL/min (by C-G formula based on SCr of 0.92 mg/dL).  Liver Function Tests: Recent Labs  Lab  08/29/20 1304  AST 21  ALT 33  ALKPHOS 50  BILITOT 0.8  PROT 7.1  ALBUMIN 3.8    Urine analysis:    Component Value Date/Time   COLORURINE YELLOW (A) 03/30/2020 0414   APPEARANCEUR HAZY (A) 03/30/2020 0414   LABSPEC 1.027 03/30/2020 0414   PHURINE 5.0 03/30/2020 0414   GLUCOSEU NEGATIVE 03/30/2020 0414   HGBUR NEGATIVE 03/30/2020 0414   BILIRUBINUR NEGATIVE 03/30/2020 0414   KETONESUR 20 (A) 03/30/2020 0414   PROTEINUR 30 (A) 03/30/2020 0414   NITRITE NEGATIVE 03/30/2020 0414   LEUKOCYTESUR NEGATIVE 03/30/2020 0414    Radiological Exams on Admission: DG Chest 2 View  Result Date: 08/28/2020 CLINICAL DATA:  Wheezing, cough. EXAM: CHEST - 2 VIEW COMPARISON:  July 07, 2020. FINDINGS: The heart size and mediastinal contours are within normal limits. Both lungs are clear. The visualized skeletal structures are unremarkable. IMPRESSION: No active cardiopulmonary disease. Electronically Signed   By: Lupita RaiderJames  Green Jr M.D.   On: 08/28/2020 12:15   DG Chest Portable 1 View  Result Date: 08/29/2020 CLINICAL DATA:  Dyspnea, cough, weakness, nausea, vomiting, onset of symptoms overnight, history asthma, former smoker EXAM: PORTABLE CHEST 1 VIEW COMPARISON:  Portable exam 1339 hours compared to 08/28/2020 FINDINGS: Normal heart size, mediastinal contours, and pulmonary vascularity. Minimal peribronchial thickening, chronic, may be related history of asthma. No acute infiltrate, pleural effusion, or pneumothorax. Osseous structures unremarkable. IMPRESSION: No acute abnormalities. Electronically Signed   By: Ulyses SouthwardMark  Boles M.D.   On: 08/29/2020 14:21   EKG: Independently reviewed.  Not obtained in the ED today.  EKG yesterday with normal sinus rhythm at 90 bpm.  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  Briefly treated yesterday with steroids and nebs with some improvement but unable to pick up steroids outpatient at his pharmacy and with return of symptoms this morning in addition to GI symptoms as below. > After receiving Solu-Medrol, magnesium, duo nebs, albuterol in the ED continues to have wheezing and some shortness of breath. - Continue DuoNebs every 6  hours while awake - As needed albuterol nebs - Prednisone 50 mg daily - Continue home Singulair - Replace home Symbicort with formulary Dulera - Follow-up Covid and flu screening as well as full RVP - Oxygen therapy as needed  Viral gastroenteritis > Patient with onset of nausea, vomiting, diarrhea and crampy abdominal pain starting yesterday. > Suspect viral gastroenteritis in addition to viral trigger for his asthma exacerbation as above.  As he does not have a leukocytosis.  No focal abdominal tenderness to suggest appendicitis or diverticular disease. - Continue supportive care - Follow-up viral panel as above - If significant diarrhea can consider GI pathogen panel  Allergies - Continue home Flonase and Claritin  GERD - Continue home PPI   DVT prophylaxis: Lovenox  Code Status:   Full Family Communication:  Significant other updated at bedside  Disposition Plan:   Patient is from:  Home  Anticipated DC to:  Home  Anticipated DC date:  1 to 2 days  Anticipated DC barriers: None  Consults called:  None  Admission status:  Observation, MedSurg with telemetry  Severity of Illness: The appropriate patient status for this patient is OBSERVATION. Observation status is judged to be reasonable and necessary in order to provide the required intensity of service to ensure the patient's safety. The patient's presenting symptoms, physical exam findings, and initial radiographic and laboratory data in the context of their medical  condition is felt to place them at decreased risk for further clinical deterioration. Furthermore, it is anticipated that the patient will be medically stable for discharge from the hospital within 2 midnights of admission. The following factors support the patient status of observation.   " The patient's presenting symptoms include shortness of breath, nausea, vomiting, diarrhea. " The physical exam findings include crampy abdominal pain with tenderness, wheezing bilaterally, body aches. " The initial radiographic and laboratory data show stable CBC stable CMP, no acute disease on chest x-ray, respiratory panel flu COVID is pending however.   Synetta Fail MD [facility]  How to contact the [facility] Attending or Consulting provider 7A - 7P or covering provider during after hours 7P -7A, for this patient?   1. Check the care team in [facility] and look for a) attending/consulting TR[facility] provider listed and b) the [facility] team listed 2. Log into [email or web address] and use [place] universal password to access. If you do not have the password, please contact the hospital operator. 3. Locate the [facility] provider you are looking for under [facility] and page to a number that you can be directly reached. 4. If you still have difficulty reaching the provider, please page the [facility] (Director on Call) for the Hospitalists listed on amion for assistance.  08/29/2020, 7:51 PM

## 2020-08-30 ENCOUNTER — Other Ambulatory Visit: Payer: Self-pay

## 2020-08-30 DIAGNOSIS — B348 Other viral infections of unspecified site: Secondary | ICD-10-CM

## 2020-08-30 DIAGNOSIS — B4481 Allergic bronchopulmonary aspergillosis: Secondary | ICD-10-CM

## 2020-08-30 DIAGNOSIS — R112 Nausea with vomiting, unspecified: Secondary | ICD-10-CM

## 2020-08-30 DIAGNOSIS — R197 Diarrhea, unspecified: Secondary | ICD-10-CM

## 2020-08-30 DIAGNOSIS — J309 Allergic rhinitis, unspecified: Secondary | ICD-10-CM

## 2020-08-30 DIAGNOSIS — J4541 Moderate persistent asthma with (acute) exacerbation: Secondary | ICD-10-CM

## 2020-08-30 LAB — BASIC METABOLIC PANEL
Anion gap: 9 (ref 5–15)
BUN: 14 mg/dL (ref 6–20)
CO2: 22 mmol/L (ref 22–32)
Calcium: 8.7 mg/dL — ABNORMAL LOW (ref 8.9–10.3)
Chloride: 105 mmol/L (ref 98–111)
Creatinine, Ser: 0.92 mg/dL (ref 0.61–1.24)
GFR, Estimated: >60 mL/min (ref >60)
Glucose, Bld: 117 mg/dL — ABNORMAL HIGH (ref 70–99)
Potassium: 4.6 mmol/L (ref 3.5–5.1)
Sodium: 136 mmol/L (ref 135–145)

## 2020-08-30 LAB — CBC
HCT: 51.4 % (ref 39.0–52.0)
Hemoglobin: 17.4 g/dL — ABNORMAL HIGH (ref 13.0–17.0)
MCH: 31 pg (ref 26.0–34.0)
MCHC: 33.9 g/dL (ref 30.0–36.0)
MCV: 91.5 fL (ref 80.0–100.0)
Platelets: 328 10*3/uL (ref 150–400)
RBC: 5.62 MIL/uL (ref 4.22–5.81)
RDW: 13.4 % (ref 11.5–15.5)
WBC: 4.6 10*3/uL (ref 4.0–10.5)
nRBC: 0 % (ref 0.0–0.2)

## 2020-08-30 LAB — RESPIRATORY PANEL BY PCR

## 2020-08-30 LAB — SARS CORONAVIRUS 2 (TAT 6-24 HRS): SARS Coronavirus 2: NEGATIVE

## 2020-08-30 MED ORDER — PROCHLORPERAZINE EDISYLATE 10 MG/2ML IJ SOLN
10.0000 mg | Freq: Once | INTRAMUSCULAR | Status: AC
  Administered 2020-08-30: 10 mg via INTRAVENOUS
  Filled 2020-08-30: qty 2

## 2020-08-30 MED ORDER — METHYLPREDNISOLONE SODIUM SUCC 40 MG IJ SOLR
40.0000 mg | Freq: Two times a day (BID) | INTRAMUSCULAR | Status: DC
  Administered 2020-08-30 – 2020-08-31 (×3): 40 mg via INTRAVENOUS
  Filled 2020-08-30 (×3): qty 1

## 2020-08-30 MED ORDER — TRAMADOL HCL 50 MG PO TABS
50.0000 mg | ORAL_TABLET | Freq: Four times a day (QID) | ORAL | Status: DC | PRN
  Administered 2020-08-30: 50 mg via ORAL
  Filled 2020-08-30: qty 1

## 2020-08-30 MED ORDER — LOPERAMIDE HCL 2 MG PO CAPS
4.0000 mg | ORAL_CAPSULE | Freq: Three times a day (TID) | ORAL | Status: DC | PRN
  Administered 2020-08-30: 4 mg via ORAL
  Filled 2020-08-30: qty 2

## 2020-08-30 MED ORDER — SODIUM CHLORIDE 0.9 % IV SOLN: INTRAVENOUS | Status: DC

## 2020-08-30 NOTE — Progress Notes (Signed)
Nutrition Brief Note  Patient identified on the Malnutrition Screening Tool (MST) Report  Wt Readings from Last 15 Encounters:  08/29/20 79.8 kg  08/28/20 79.8 kg  07/07/20 75.8 kg  06/27/20 77.7 kg  06/06/20 76.8 kg  04/25/20 77.6 kg  04/04/20 74.7 kg  04/03/20 71.7 kg  03/29/20 78 kg  03/28/20 78 kg  02/18/20 75.8 kg  01/07/20 78.1 kg  11/24/19 76.1 kg  11/16/19 73.9 kg  11/03/19 80.3 kg   Shannon Knapp is a 35 y.o. male with medical history significant of allergic bronchopulmonary aspergillosis, asthma, allergies, GERD who presents with ongoing shortness of breath and new onset nausea vomiting diarrhea.  Pt admitted with severe persistent asthma with acute exacerbation and viral gastroenteritis.    Reviewed I/O's: +299 ml x 24 hours  UOP: 700 ml x 24 hours  Pt sleeping soundly at time of visit. He did not respond to name being called.   Nutrition-Focused physical exam completed. Findings are no fat depletion, no muscle depletion, and no edema.   Reviewed wt hx; wt has been stable over the past 3 months.   Medications reviewed and include solu-medrol.  Labs reviewed.   Current diet order is regular, patient is consuming approximately n/a% of meals at this time. Labs and medications reviewed.   No nutrition interventions warranted at this time. If nutrition issues arise, please consult RD.   Levada Schilling, RD, LDN, CDCES Registered Dietitian II Certified Diabetes Care and Education Specialist Please refer to [facility] for RD and/or RD on-call/weekend/after hours pager

## 2020-08-30 NOTE — Telephone Encounter (Signed)
Patient currently admitted.   Will hold message to ensure follow up after discharge.

## 2020-08-30 NOTE — Progress Notes (Addendum)
Patient ID: Shannon Knapp, male   DOB: [birth year], 35 y.o.   MRN: [id number] [facility] PROGRESS NOTE  FAUSTINO LUECKE PZW:[id number] DOB: 1985/07/20 DOA: 08/29/2020 [facility]: [name]  HPI/Subjective: Patient still feeling miserable.  Having pains all over his body.  Still having diarrhea.  Vomited this morning.  Having abdominal pain.  Still short of breath and coughing.  Patient found to have rhinovirus on the respiratory panel.  Objective: Vitals:   08/30/20 0808 08/30/20 0824  BP:  129/88  Pulse:  78  Resp:  18  Temp:  98 F (36.7 C)  SpO2: 93% 96%    Intake/Output Summary (Last 24 hours) at 08/30/2020 1353 Last data filed at 08/30/2020 1036 Gross per 24 hour  Intake 1129 ml  Output 700 ml  Net 429 ml   Filed Weights   08/29/20 1243  Weight: 79.8 kg    ROS: Review of Systems  Respiratory: Positive for cough and shortness of breath.   Cardiovascular: Negative for chest pain.  Gastrointestinal: Positive for abdominal pain, diarrhea, nausea and vomiting.  Musculoskeletal: Positive for joint pain.   Exam: Physical Exam HENT:     Head: Normocephalic.     Mouth/Throat:     Pharynx: No oropharyngeal exudate.  Eyes:     General: Lids are normal.     Conjunctiva/sclera: Conjunctivae normal.  Cardiovascular:     Rate and Rhythm: Normal rate and regular rhythm.     Heart sounds: Normal heart sounds, S1 normal and S2 normal.  Pulmonary:     Breath sounds: Examination of the right-lower field reveals decreased breath sounds. Examination of the left-lower field reveals decreased breath sounds. Decreased breath sounds present. No wheezing, rhonchi or rales.  Abdominal:     Palpations: Abdomen is soft.     Tenderness: There is no abdominal tenderness.  Musculoskeletal:     Right lower leg: No swelling.     Left lower leg: No swelling.  Skin:    Findings: No rash.  Neurological:     Mental Status: He is alert and oriented to person,  place, and time.       Data Reviewed: Basic Metabolic Panel: Recent Labs  Lab 08/29/20 1304 08/30/20 0325  NA 140 136  K 4.7 4.6  CL 104 105  CO2 27 22  GLUCOSE 115* 117*  BUN 12 14  CREATININE 0.92 0.92  CALCIUM 8.8* 8.7*   Liver Function Tests: Recent Labs  Lab 08/29/20 1304  AST 21  ALT 33  ALKPHOS 50  BILITOT 0.8  PROT 7.1  ALBUMIN 3.8   CBC: Recent Labs  Lab 08/29/20 1304 08/30/20 0325  WBC 8.4 4.6  NEUTROABS 5.3  --   HGB 16.7 17.4*  HCT 49.7 51.4  MCV 92.7 91.5  PLT 302 328     Recent Results (from the past 240 hour(s))  SARS CORONAVIRUS 2 (TAT 6-24 HRS) Nasopharyngeal Nasopharyngeal Swab     Status: None   Collection Time: 08/29/20  2:26 PM   Specimen: Nasopharyngeal Swab  Result Value Ref Range Status   SARS Coronavirus 2 NEGATIVE NEGATIVE Final    Comment: (NOTE) SARS-CoV-2 target nucleic acids are NOT DETECTED.  The SARS-CoV-2 RNA is generally detectable in upper and lower respiratory specimens during the acute phase of infection. Negative results do not preclude SARS-CoV-2 infection, do not rule out co-infections with other pathogens, and should not be used as the sole basis for treatment or other patient management decisions. Negative results  must be combined with clinical observations, patient history, and epidemiological information. The expected result is Negative.  Fact Sheet for Patients: HairSlick.no  Fact Sheet for Healthcare Providers: quierodirigir.com  This test is not yet approved or cleared by the [place] FDA and  has been authorized for detection and/or diagnosis of SARS-CoV-2 by FDA under an Emergency Use Authorization (EUA). This EUA will remain  in effect (meaning this test can be used) for the duration of the COVID-19 declaration under Se ction 564(b)(1) of the Act, 21 U.S.C. section 360bbb-3(b)(1), unless the authorization is terminated or revoked  sooner.  Performed at [facility], [address] [place], [place] [address]   Respiratory (~20 pathogens) panel by PCR     Status: Abnormal   Collection Time: 08/29/20  7:50 PM   Specimen: Nasopharyngeal Swab; Respiratory  Result Value Ref Range Status   Adenovirus NOT DETECTED NOT DETECTED Final   Coronavirus 229E NOT DETECTED NOT DETECTED Final    Comment: (NOTE) The Coronavirus on the Respiratory Panel, DOES NOT test for the novel  Coronavirus (2019 nCoV)    Coronavirus HKU1 NOT DETECTED NOT DETECTED Final   Coronavirus NL63 NOT DETECTED NOT DETECTED Final   Coronavirus OC43 NOT DETECTED NOT DETECTED Final   Metapneumovirus NOT DETECTED NOT DETECTED Final   Rhinovirus / Enterovirus DETECTED (A) NOT DETECTED Final   Influenza A NOT DETECTED NOT DETECTED Final   Influenza B NOT DETECTED NOT DETECTED Final   Parainfluenza Virus 1 NOT DETECTED NOT DETECTED Final   Parainfluenza Virus 2 NOT DETECTED NOT DETECTED Final   Parainfluenza Virus 3 NOT DETECTED NOT DETECTED Final   Parainfluenza Virus 4 NOT DETECTED NOT DETECTED Final   Respiratory Syncytial Virus NOT DETECTED NOT DETECTED Final   Bordetella pertussis NOT DETECTED NOT DETECTED Final   Bordetella Parapertussis NOT DETECTED NOT DETECTED Final   Chlamydophila pneumoniae NOT DETECTED NOT DETECTED Final   Mycoplasma pneumoniae NOT DETECTED NOT DETECTED Final    Comment: Performed at [facility], [address], [place], [place] [address]     Studies: DG Chest Portable 1 View  Result Date: 08/29/2020 CLINICAL DATA:  Dyspnea, cough, weakness, nausea, vomiting, onset of symptoms overnight, history asthma, former smoker EXAM: PORTABLE CHEST 1 VIEW COMPARISON:  Portable exam 1339 hours compared to 08/28/2020 FINDINGS: Normal heart size, mediastinal contours, and pulmonary vascularity. Minimal peribronchial thickening, chronic, may be related history of asthma. No acute infiltrate, pleural effusion, or pneumothorax.  Osseous structures unremarkable. IMPRESSION: No acute abnormalities. Electronically Signed   By: Ulyses Southward M.D.   On: 08/29/2020 14:21    Scheduled Meds: . enoxaparin (LOVENOX) injection  40 mg Subcutaneous Q24H  . fluticasone  2 spray Each Nare Daily  . ipratropium-albuterol  3 mL Nebulization Q6H WA  . loratadine  10 mg Oral Daily  . methylPREDNISolone (SOLU-MEDROL) injection  40 mg Intravenous Q12H  . mometasone-formoterol  2 puff Inhalation BID  . montelukast  10 mg Oral QHS  . pantoprazole  40 mg Oral Daily  . sodium chloride flush  3 mL Intravenous Q12H   Continuous Infusions: . sodium chloride      Assessment/Plan:  1. Acute asthma exacerbation moderate persistent with history of allergic bronchopulmonary aspergillosis.  Change prednisone over to Solu-Medrol.  Continue nebulizer treatments.  Order IgE level.  Can consider adding voriconazole, if he does not improve on the steroids.  Of note, patient was positive for Covid back in February. 2. Viral gastroenteritis and joint  pains likely with rhinovirus.  Abdominal pain likely secondary to nausea vomiting and diarrhea.  We will send off stool for culture if any further diarrhea.  As needed Imodium.  As needed pain medications 3. Weight loss.  Continue to monitor 4. Allergic rhinitis on Flonase and Claritin 5. GERD on PPI      Code Status:     Code Status Orders  (From admission, onward)         Start     Ordered   08/29/20 1908  Full code  Continuous        08/29/20 1908        Code Status History    Date Active Date Inactive Code Status Order ID Comments User Context   07/07/2020 1818 07/09/2020 1557 Full Code [id number]  Lurene Shadow, MD ED   03/30/2020 0146 04/03/2020 1701 Full Code [id number]  Therisa Doyne, MD ED   10/25/2019 2121 10/27/2019 2105 Full Code [id number]  Hillary Bow, DO ED   04/10/2018 0039 04/10/2018 2037 Full Code [id number]  Haydee Monica, MD ED   11/28/2017 2354 11/30/2017 1348 Full  Code [id number]  Kathrynn Running, MD Inpatient   10/17/2017 1645 10/30/2017 1654 Full Code [id number]  Lahoma Crocker, MD ED   Advance Care Planning Activity     Family Communication: Fianc at bedside Disposition Plan: Status is: Inpatient  Dispo: The patient is from: Home              Anticipated d/c is to: Home.              Patient currently still requiring IV Solu-Medrol for difficulty breathing and bronchospasm   Difficult to place patient.  No.  Time spent: 28 minutes  [employer]

## 2020-08-31 ENCOUNTER — Other Ambulatory Visit: Payer: Self-pay

## 2020-08-31 DIAGNOSIS — R634 Abnormal weight loss: Secondary | ICD-10-CM

## 2020-08-31 LAB — EXPECTORATED SPUTUM ASSESSMENT W GRAM STAIN, RFLX TO RESP C: Special Requests: NORMAL

## 2020-08-31 LAB — BASIC METABOLIC PANEL
Anion gap: 10 (ref 5–15)
BUN: 8 mg/dL (ref 6–20)
CO2: 24 mmol/L (ref 22–32)
Calcium: 8.6 mg/dL — ABNORMAL LOW (ref 8.9–10.3)
Chloride: 101 mmol/L (ref 98–111)
Creatinine, Ser: 0.77 mg/dL (ref 0.61–1.24)
GFR, Estimated: >60 mL/min (ref >60)
Glucose, Bld: 116 mg/dL — ABNORMAL HIGH (ref 70–99)
Potassium: 4.4 mmol/L (ref 3.5–5.1)
Sodium: 135 mmol/L (ref 135–145)

## 2020-08-31 LAB — CBC
HCT: 45.4 % (ref 39.0–52.0)
Hemoglobin: 15.8 g/dL (ref 13.0–17.0)
MCH: 31.9 pg (ref 26.0–34.0)
MCHC: 34.8 g/dL (ref 30.0–36.0)
MCV: 91.7 fL (ref 80.0–100.0)
Platelets: 297 10*3/uL (ref 150–400)
RBC: 4.95 MIL/uL (ref 4.22–5.81)
RDW: 13.3 % (ref 11.5–15.5)
WBC: 4.2 10*3/uL (ref 4.0–10.5)
nRBC: 0 % (ref 0.0–0.2)

## 2020-08-31 LAB — LIPASE, BLOOD: Lipase: 22 U/L (ref 11–51)

## 2020-08-31 MED ORDER — PREDNISONE 10 MG PO TABS
ORAL_TABLET | ORAL | 0 refills | Status: DC
  Filled 2020-08-31: qty 54, 30d supply, fill #0

## 2020-08-31 MED ORDER — SYMBICORT 160-4.5 MCG/ACT IN AERO
2.0000 | INHALATION_SPRAY | Freq: Two times a day (BID) | RESPIRATORY_TRACT | 12 refills | Status: DC
  Filled 2020-08-31: qty 10.2, 30d supply, fill #0
  Filled 2020-10-17: qty 10.2, 30d supply, fill #1

## 2020-08-31 MED ORDER — ALBUTEROL SULFATE 1.25 MG/3ML IN NEBU
3.0000 mL | INHALATION_SOLUTION | Freq: Four times a day (QID) | RESPIRATORY_TRACT | 0 refills | Status: DC | PRN
  Filled 2020-08-31: qty 90, 8d supply, fill #0

## 2020-08-31 MED ORDER — PANTOPRAZOLE SODIUM 40 MG PO TBEC
40.0000 mg | DELAYED_RELEASE_TABLET | Freq: Every day | ORAL | 0 refills | Status: DC
  Filled 2020-08-31: qty 30, 30d supply, fill #0

## 2020-08-31 MED ORDER — LORATADINE 10 MG PO TABS: 10.0000 mg | ORAL_TABLET | Freq: Every day | ORAL | 0 refills | Status: DC

## 2020-08-31 MED ORDER — ALBUTEROL SULFATE HFA 108 (90 BASE) MCG/ACT IN AERS
2.0000 | INHALATION_SPRAY | Freq: Four times a day (QID) | RESPIRATORY_TRACT | 0 refills | Status: DC | PRN
  Filled 2020-08-31: qty 6.7, 25d supply, fill #0

## 2020-08-31 NOTE — TOC Progression Note (Signed)
Transition of Care [facility]) - Progression Note    Patient Details  Name: Shannon Knapp MRN: [id number] Date of Birth: [birth year]  Transition of Care [facility]) CM/SW Contact  Shannon Dunker, RN Phone Number: 08/31/2020, 9:40 AM  Clinical Narrative:   Spoke with the patient about DC plan and needs He is already set up with [facility] to get his meds, they did not have the steroid dose pack after his last DC and they had to order it, he is sending his Fiance over today to see if they have it now, he will continue to get medications from [facility] and he is [facility] already, he is independent at home and has no additional needs        Expected Discharge Plan and Services                                                 Social Determinants of Health (SDOH) Interventions    Readmission Risk Interventions Readmission Risk Prevention Plan 03/31/2020  Transportation Screening Complete  PCP or Specialist Appt within 5-7 Days Not Complete  Not Complete comments To be done at discharge.  Home Care Screening Complete  Medication Review (RN CM) Complete  Some recent data might be hidden

## 2020-08-31 NOTE — Discharge Summary (Signed)
[facility] [place] [facility]   PATIENT NAME: Shannon Knapp    MR#:  [id number]  DATE OF BIRTH:  01/23/1986  DATE OF ADMISSION:  08/29/2020 ADMITTING PHYSICIAN: Alford Highlandichard Katiana Ruland, MD  DATE OF DISCHARGE: 08/31/2020  2:44 PM  PRIMARY CARE PHYSICIAN: [facility]    ADMISSION DIAGNOSIS:  Dehydration [E86.0] Moderate persistent asthma with status asthmaticus [J45.42] Generalized weakness [R53.1] Nausea vomiting and diarrhea [R11.2, R19.7] Asthma in adult, severe persistent, with acute exacerbation [J45.51] Asthma exacerbation [J45.901]  DISCHARGE DIAGNOSIS:  Principal Problem:   Asthma in adult, severe persistent, with acute exacerbation Active Problems:   Asthma exacerbation   Allergic rhinitis   GERD (gastroesophageal reflux disease)   Viral gastroenteritis   Rhinovirus infection   Nausea vomiting and diarrhea   SECONDARY DIAGNOSIS:   Past Medical History:  Diagnosis Date  . Allergic bronchopulmonary aspergillosis (HCC)   . Asthma   . Chronic bronchitis (HCC)     HOSPITAL COURSE:   1.  Acute asthma exacerbation, moderate persistent.  This was likely triggered by rhinovirus infection.  The patient does have a history of allergic bronchopulmonary aspergillosis and is on chronic prednisone 15 mg daily.  The patient responded well to nebulizer treatments and IV Solu-Medrol.  We will put on a prednisone taper down to his usual 15 mg.  Case discussed with his pulmonologist Dr. Jayme CloudGonzalez and she did a course of Itraconazole previously.  IgE level sent off. 2.  Viral gastroenteritis and joint pains likely with rhinovirus.  Abdominal pain secondary to nausea vomiting diarrhea.  Patient feeling better and able to tolerate diet and stable for discharge home. 3.  Weight loss.  Follow-up as outpatient 4.  Allergic rhinitis.  On Claritin 5.  GERD on PPI  DISCHARGE CONDITIONS:   Satisfactory  CONSULTS OBTAINED:  None  DRUG  ALLERGIES:   Allergies  Allergen Reactions  . Amoxicillin Hives    Hives all over the body. [address] [facility] [employer] Has patient had a PCN reaction causing immediate rash, facial/tongue/throat swelling, SOB or lightheadedness with hypotension: Yes Has patient had a PCN reaction causing severe rash involving mucus membranes or skin necrosis: No Has patient had a PCN reaction that required hospitalization: No Has patient had a PCN reaction occurring within the last 10 years: Yes If all of the above answers are "NO", then may proceed with Cephalosporin use.  . Omalizumab Anaphylaxis  . Doxycycline   . Fish Allergy Swelling  . Shellfish Allergy Swelling  . Penicillins Hives    Has patient had a PCN reaction causing immediate rash, facial/tongue/throat swelling, SOB or lightheadedness with hypotension: Yes Has patient had a PCN reaction causing severe rash involving mucus membranes or skin necrosis: No Has patient had a PCN reaction that required hospitalization: No Has patient had a PCN reaction occurring within the last 10 years: Yes If all of the above answers are "NO", then may proceed with Cephalosporin use.    DISCHARGE MEDICATIONS:   Allergies as of 08/31/2020      Reactions   Amoxicillin Hives   Hives all over the body. [address] [address] [employer]. Has patient had a PCN reaction causing immediate rash, facial/tongue/throat swelling, SOB or lightheadedness with hypotension: Yes Has patient had a PCN reaction causing severe rash involving mucus membranes or skin necrosis: No Has patient had a PCN reaction that required hospitalization: No Has patient had a PCN reaction occurring within the last 10 years: Yes If all of the above answers  are "NO", then may proceed with Cephalosporin use.   Omalizumab Anaphylaxis   Doxycycline    Fish Allergy Swelling   Shellfish Allergy Swelling   Penicillins Hives   Has patient had a PCN reaction causing immediate rash, facial/tongue/throat  swelling, SOB or lightheadedness with hypotension: Yes Has patient had a PCN reaction causing severe rash involving mucus membranes or skin necrosis: No Has patient had a PCN reaction that required hospitalization: No Has patient had a PCN reaction occurring within the last 10 years: Yes If all of the above answers are "NO", then may proceed with Cephalosporin use.      Medication List    STOP taking these medications   cetirizine 10 MG tablet Commonly known as: ZYRTEC Replaced by: loratadine 10 MG tablet   methylPREDNISolone 4 MG Tbpk tablet Commonly known as: MEDROL DOSEPAK   omeprazole 20 MG capsule Commonly known as: PRILOSEC Replaced by: pantoprazole 40 MG tablet     TAKE these medications   albuterol 1.25 MG/3ML nebulizer solution Commonly known as: ACCUNEB Take 3 mLs by nebulization every 6 (six) hours as needed for wheezing. What changed: Another medication with the same name was changed. Make sure you understand how and when to take each.   albuterol 108 (90 Base) MCG/ACT inhaler Commonly known as: VENTOLIN HFA Inhale 2 puffs into the lungs every 6 (six) hours as needed for wheezing or shortness of breath. What changed:  how much to take how to take this when to take this reasons to take this   benzonatate 100 MG capsule Commonly known as: Tessalon Perles Take 2 capsules (200 mg total) by mouth 3 (three) times daily as needed.   EpiPen 2-Pak 0.3 mg/0.3 mL Soaj injection Generic drug: EPINEPHrine INJECT 0.3MG  INTO THE MUSCLE AS NEEDED FOR ANAPHYLAXIS   loratadine 10 MG tablet Commonly known as: CLARITIN Take 1 tablet (10 mg total) by mouth daily. Start taking on: September 01, 2020 Replaces: cetirizine 10 MG tablet   montelukast 10 MG tablet Commonly known as: SINGULAIR TAKE ONE TABLET BY MOUTH AT BEDTIME   pantoprazole 40 MG tablet Commonly known as: PROTONIX Take 1 tablet (40 mg total) by mouth daily. Start taking on: September 01, 2020 Replaces:  omeprazole 20 MG capsule   predniSONE 10 MG tablet Commonly known as: DELTASONE Take 4 tablets by mouth on day 1 and 2; 3 tabs on day 3 and 4 and 2 tabs on day 5 and 6. Then take 1.5 tabs daily afterwards. Start taking on: September 01, 2020   Symbicort 160-4.5 MCG/ACT inhaler Generic drug: budesonide-formoterol Inhale 2 puffs into the lungs in the morning and at bedtime. What changed:  how much to take how to take this when to take this        DISCHARGE INSTRUCTIONS:   Follow-up PMD 5 days Follow-up pulmonary few weeks  If you experience worsening of your admission symptoms, develop shortness of breath, life threatening emergency, suicidal or homicidal thoughts you must seek medical attention immediately by calling 911 or calling your MD immediately  if symptoms less severe.  You Must read complete instructions/literature along with all the possible adverse reactions/side effects for all the Medicines you take and that have been prescribed to you. Take any new Medicines after you have completely understood and accept all the possible adverse reactions/side effects.   Please note  You were cared for by a hospitalist during your hospital stay. If you have any questions about your discharge medications or the care you  received while you were in the hospital after you are discharged, you can call the unit and asked to speak with the hospitalist on call if the hospitalist that took care of you is not available. Once you are discharged, your primary care physician will handle any further medical issues. Please note that NO REFILLS for any discharge medications will be authorized once you are discharged, as it is imperative that you return to your primary care physician (or establish a relationship with a primary care physician if you do not have one) for your aftercare needs so that they can reassess your need for medications and monitor your lab values.    Today   CHIEF COMPLAINT:    Chief Complaint  Patient presents with  . Weakness    HISTORY OF PRESENT ILLNESS:  Rhen Kawecki  is a 35 y.o. male came in with weakness   VITAL SIGNS:  Blood pressure 127/87, pulse 82, temperature 98.1 F (36.7 C), resp. rate 15, height 5\' 11"  (1.803 m), weight 79.8 kg, SpO2 96 %.  I/O:    Intake/Output Summary (Last 24 hours) at 08/31/2020 1624 Last data filed at 08/31/2020 0700 Gross per 24 hour  Intake 826.45 ml  Output 300 ml  Net 526.45 ml    PHYSICAL EXAMINATION:  GENERAL:  35 y.o.-year-old patient lying in the bed with no acute distress.  EYES: Pupils equal, round, reactive to light and accommodation. No scleral icterus.  HEENT: Head atraumatic, normocephalic. Oropharynx and nasopharynx clear.  LUNGS: Decreased breath sounds bilateral bases, no wheezing.  Slight rhonchi at the bases.  No use of accessory muscles of respiration.  CARDIOVASCULAR: S1, S2 normal. No murmurs, rubs, or gallops.  ABDOMEN: Soft, non-tender, non-distended. Bowel sounds present. No organomegaly or mass.  EXTREMITIES: No pedal edema.  NEUROLOGIC: Cranial nerves II through XII are intact. Muscle strength 5/5 in all extremities. Sensation intact. Gait not checked.  PSYCHIATRIC: The patient is alert and oriented x 3.  SKIN: No obvious rash, lesion, or ulcer.   DATA REVIEW:   CBC Recent Labs  Lab 08/31/20 0500  WBC 4.2  HGB 15.8  HCT 45.4  PLT 297    Chemistries  Recent Labs  Lab 08/29/20 1304 08/30/20 0325 08/31/20 0500  NA 140   < > 135  K 4.7   < > 4.4  CL 104   < > 101  CO2 27   < > 24  GLUCOSE 115*   < > 116*  BUN 12   < > 8  CREATININE 0.92   < > 0.77  CALCIUM 8.8*   < > 8.6*  AST 21  --   --   ALT 33  --   --   ALKPHOS 50  --   --   BILITOT 0.8  --   --    < > = values in this interval not displayed.    Microbiology Results  Results for orders placed or performed during the hospital encounter of 08/29/20  SARS CORONAVIRUS 2 (TAT 6-24 HRS) Nasopharyngeal  Nasopharyngeal Swab     Status: None   Collection Time: 08/29/20  2:26 PM   Specimen: Nasopharyngeal Swab  Result Value Ref Range Status   SARS Coronavirus 2 NEGATIVE NEGATIVE Final    Comment: (NOTE) SARS-CoV-2 target nucleic acids are NOT DETECTED.  The SARS-CoV-2 RNA is generally detectable in upper and lower respiratory specimens during the acute phase of infection. Negative results do not preclude SARS-CoV-2 infection, do not rule out co-infections  with other pathogens, and should not be used as the sole basis for treatment or other patient management decisions. Negative results must be combined with clinical observations, patient history, and epidemiological information. The expected result is Negative.  Fact Sheet for Patients: HairSlick.no  Fact Sheet for Healthcare Providers: quierodirigir.com  This test is not yet approved or cleared by the [place] FDA and  has been authorized for detection and/or diagnosis of SARS-CoV-2 by FDA under an Emergency Use Authorization (EUA). This EUA will remain  in effect (meaning this test can be used) for the duration of the COVID-19 declaration under Se ction 564(b)(1) of the Act, 21 U.S.C. section 360bbb-3(b)(1), unless the authorization is terminated or revoked sooner.  Performed at [facility], [address] [place], [place] [address]   Respiratory (~20 pathogens) panel by PCR     Status: Abnormal   Collection Time: 08/29/20  7:50 PM   Specimen: Nasopharyngeal Swab; Respiratory  Result Value Ref Range Status   Adenovirus NOT DETECTED NOT DETECTED Final   Coronavirus 229E NOT DETECTED NOT DETECTED Final    Comment: (NOTE) The Coronavirus on the Respiratory Panel, DOES NOT test for the novel  Coronavirus (2019 nCoV)    Coronavirus HKU1 NOT DETECTED NOT DETECTED Final   Coronavirus NL63 NOT DETECTED NOT DETECTED Final   Coronavirus OC43 NOT DETECTED NOT  DETECTED Final   Metapneumovirus NOT DETECTED NOT DETECTED Final   Rhinovirus / Enterovirus DETECTED (A) NOT DETECTED Final   Influenza A NOT DETECTED NOT DETECTED Final   Influenza B NOT DETECTED NOT DETECTED Final   Parainfluenza Virus 1 NOT DETECTED NOT DETECTED Final   Parainfluenza Virus 2 NOT DETECTED NOT DETECTED Final   Parainfluenza Virus 3 NOT DETECTED NOT DETECTED Final   Parainfluenza Virus 4 NOT DETECTED NOT DETECTED Final   Respiratory Syncytial Virus NOT DETECTED NOT DETECTED Final   Bordetella pertussis NOT DETECTED NOT DETECTED Final   Bordetella Parapertussis NOT DETECTED NOT DETECTED Final   Chlamydophila pneumoniae NOT DETECTED NOT DETECTED Final   Mycoplasma pneumoniae NOT DETECTED NOT DETECTED Final    Comment: Performed at [facility], [address] [place], [place] [address]  Expectorated Sputum Assessment w Gram Stain, Rflx to Resp Cult     Status: None   Collection Time: 08/30/20 10:25 PM   Specimen: Sputum  Result Value Ref Range Status   Specimen Description SPUTUM  Final   Special Requests Normal  Final   Sputum evaluation   Final    Sputum specimen not acceptable for testing.  Please recollect.   CALLED TO MARCEL Tanksley AT 0320 ON 08/31/2020 RH. Performed at [facility], [address], [place], [place] [address]    Report Status 08/31/2020 FINAL  Final     Management plans discussed with the patient, family and they are in agreement.  CODE STATUS:     Code Status Orders  (From admission, onward)         Start     Ordered   08/29/20 1908  Full code  Continuous        08/29/20 1908        Code Status History    Date Active Date Inactive Code Status Order ID Comments User Context   07/07/2020 1818 07/09/2020 1557 Full Code [id number]  Lurene Shadow, MD ED   03/30/2020 0146 04/03/2020 1701 Full Code [id number]  Therisa Doyne, MD ED   10/25/2019 2121 10/27/2019 2105 Full Code [id number]  Julian Reil,  Heywood Iles, DO ED    04/10/2018 0039 04/10/2018 2037 Full Code [id number]  Haydee Monica, MD ED   11/28/2017 2354 11/30/2017 1348 Full Code [id number]  Kathrynn Running, MD Inpatient   10/17/2017 1645 10/30/2017 1654 Full Code [id number]  Lahoma Crocker, MD ED   Advance Care Planning Activity      TOTAL TIME TAKING CARE OF THIS PATIENT: 35 minutes.    Alford Highland M.D on 08/31/2020 at 4:24 PM  Between 7am to 6pm - Pager - [phone]  After 6pm go to [email or web address] - Social research officer, government  [facility]  CC: Primary care physician; [name]

## 2020-08-31 NOTE — Progress Notes (Signed)
Pt provided discharge instructions and all questions answered at this time. Pts mode of transport at discharge is ambulating with steady gait.    08/31/20 0903  Vitals  Temp 98.1 F (36.7 C)  BP 127/87  MAP (mmHg) 99  BP Method Automatic  Pulse Rate 82  Pulse Rate Source Monitor  Resp 15  MEWS COLOR  MEWS Score Color Green  Oxygen Therapy  SpO2 96 %

## 2020-09-04 ENCOUNTER — Encounter: Payer: Self-pay | Admitting: Pulmonary Disease

## 2020-09-04 NOTE — Telephone Encounter (Signed)
Lm for patient for update on dupixent forms.

## 2020-09-05 NOTE — Telephone Encounter (Signed)
Spoke to patient, who stated that he did not receive paperwork for dupixent.  Patient stated that he would come by our office on 09/06/20 to complete forms. Forms have been placed up front.  Will hold message to ensure f/u.

## 2020-09-06 ENCOUNTER — Other Ambulatory Visit (HOSPITAL_COMMUNITY): Payer: Self-pay

## 2020-09-07 LAB — IGE: IgE (Immunoglobulin E), Serum: 6067 IU/mL — ABNORMAL HIGH (ref 6–495)

## 2020-09-08 NOTE — Telephone Encounter (Signed)
Patient has not came by to sign forms.  Lm for update.  

## 2020-09-13 NOTE — Telephone Encounter (Signed)
Forms have not been picked up by patient.   Lm x2 for undate.  Will call once more due to nature of call.

## 2020-09-14 NOTE — Telephone Encounter (Signed)
Spoke to patient, who stated that he forgot about coming by to sign forms, however he will attempt to come by today.

## 2020-09-18 NOTE — Telephone Encounter (Signed)
Patient did not come by and complete forms.  Lm for patient.

## 2020-09-19 NOTE — Telephone Encounter (Signed)
Lm x2 for patient.  Letter has been mailed to address on file.  Will close encounter.  

## 2020-10-03 ENCOUNTER — Telehealth: Payer: Self-pay | Admitting: Pharmacist

## 2020-10-03 NOTE — Telephone Encounter (Signed)
10/03/2020 10:00:55 AM - EpiPen refill called to [facility]  -- Annette Johnson - Tuesday, Oct 03, 2020 9:59 AM --Called [facility] [phone] spoke with Johnny to place refill, allow 5-10 business days for us to receive. 

## 2020-10-04 ENCOUNTER — Ambulatory Visit: Payer: Medicaid Other | Admitting: Primary Care

## 2020-10-09 ENCOUNTER — Other Ambulatory Visit: Payer: Self-pay

## 2020-10-13 ENCOUNTER — Other Ambulatory Visit: Payer: Self-pay

## 2020-10-17 ENCOUNTER — Other Ambulatory Visit: Payer: Self-pay

## 2020-10-17 ENCOUNTER — Other Ambulatory Visit: Payer: Self-pay | Admitting: Primary Care

## 2020-10-18 ENCOUNTER — Telehealth: Payer: Self-pay | Admitting: Pulmonary Disease

## 2020-10-18 ENCOUNTER — Other Ambulatory Visit: Payer: Self-pay

## 2020-10-18 MED ORDER — ALBUTEROL SULFATE HFA 108 (90 BASE) MCG/ACT IN AERS
2.0000 | INHALATION_SPRAY | Freq: Four times a day (QID) | RESPIRATORY_TRACT | 2 refills | Status: DC | PRN
  Filled 2020-10-18: qty 8, 30d supply, fill #0
  Filled 2020-10-19: qty 6.7, 25d supply, fill #0
  Filled 2020-12-25: qty 8.5, 25d supply, fill #1

## 2020-10-18 MED ORDER — PREDNISONE 10 MG PO TABS
15.0000 mg | ORAL_TABLET | Freq: Every day | ORAL | 0 refills | Status: DC
  Filled 2020-10-18 – 2020-10-19 (×2): qty 45, 30d supply, fill #0

## 2020-10-18 NOTE — Telephone Encounter (Signed)
Rx for prednisone and albuterol HFA has been sent to preferred pharmacy.  Patient is aware and voiced his understanding. He stated that he would come by to sign Dupixent forms.  Nothing further needed at this time.

## 2020-10-18 NOTE — Telephone Encounter (Signed)
Dr. Reece Agar, please advise on med refill requests.

## 2020-10-19 ENCOUNTER — Other Ambulatory Visit: Payer: Self-pay | Admitting: Primary Care

## 2020-10-19 ENCOUNTER — Other Ambulatory Visit: Payer: Self-pay

## 2020-10-20 ENCOUNTER — Telehealth: Payer: Self-pay | Admitting: Pharmacist

## 2020-10-20 MED FILL — Prednisone Tab 10 MG: ORAL | Qty: 45 | Fill #0 | Status: CN

## 2020-10-20 NOTE — Telephone Encounter (Signed)
10/20/2020 1:40:52 PM - Spiriva refill called to [facility]  -- Annette Johnson - Friday, October 20, 2020 1:40 PM --Called [facility] for refill on Spiriva, allow 10-14 business days to receive meds at our office. 

## 2020-10-23 ENCOUNTER — Other Ambulatory Visit: Payer: Self-pay

## 2020-10-23 ENCOUNTER — Telehealth: Payer: Self-pay

## 2020-10-23 NOTE — Telephone Encounter (Signed)
Received symbicort Rx from medication management via interoffice.  Rx has been signed and returned to medication management via interoffice. Nothing further needed at this time.

## 2020-10-24 ENCOUNTER — Other Ambulatory Visit: Payer: Self-pay

## 2020-10-26 ENCOUNTER — Other Ambulatory Visit: Payer: Self-pay

## 2020-10-26 ENCOUNTER — Encounter: Payer: Self-pay | Admitting: Adult Health

## 2020-10-26 ENCOUNTER — Ambulatory Visit (INDEPENDENT_AMBULATORY_CARE_PROVIDER_SITE_OTHER): Payer: Medicaid Other | Admitting: Adult Health

## 2020-10-26 VITALS — BP 120/80 | HR 82 | Temp 97.1°F | Ht 71.0 in | Wt 169.8 lb

## 2020-10-26 DIAGNOSIS — J309 Allergic rhinitis, unspecified: Secondary | ICD-10-CM

## 2020-10-26 DIAGNOSIS — Z79899 Other long term (current) drug therapy: Secondary | ICD-10-CM

## 2020-10-26 DIAGNOSIS — J4550 Severe persistent asthma, uncomplicated: Secondary | ICD-10-CM

## 2020-10-26 DIAGNOSIS — B4481 Allergic bronchopulmonary aspergillosis: Secondary | ICD-10-CM

## 2020-10-26 NOTE — Assessment & Plan Note (Signed)
Continue on trigger prevention and current regimen 

## 2020-10-26 NOTE — Assessment & Plan Note (Signed)
Patient is on chronic steroids.  Patient education given on steroids.  Advised to discuss with primary care provider as he will need ongoing screening for possible diabetes, osteoporosis.

## 2020-10-26 NOTE — Patient Instructions (Signed)
Continue on Symbicort 2 puffs Twice daily   Restart Spiriva 2 puffs daily  Continue on Claritin 10mg  At bedtime  .  Continue on Singulair daily  Continue on Prednisone 15mg  daily  Albuterol inhaler or Neb As needed   Asthma action plan as discussed .   Dupixent paperwork .  Keep Epi Pen with you.  Follow up with Dr. in 2 months  with PFT  Please contact office for sooner follow up if symptoms do not improve or worsen or seek emergency care

## 2020-10-26 NOTE — Assessment & Plan Note (Addendum)
Severe persistent asthma.  Patient is prone to frequent exacerbations and hospitalizations.  Patient education was given on medication compliance.  Patient is to restart Spiriva.  Return Dupixent paperwork. Check PFTs on return  Plan  Patient Instructions  Continue on Symbicort 2 puffs Twice daily   Restart Spiriva 2 puffs daily  Continue on Claritin 10mg  At bedtime  .  Continue on Singulair daily  Continue on Prednisone 15mg  daily  Albuterol inhaler or Neb As needed   Asthma action plan as discussed .   Dupixent paperwork .  Keep Epi Pen with you.  Follow up with Dr. in 2 months  with PFT  Please contact office for sooner follow up if symptoms do not improve or worsen or seek emergency care

## 2020-10-26 NOTE — Progress Notes (Signed)
@Patient  ID: , male    DOB: 1985/12/01, 35 y.o.   MRN: [id number]  Chief Complaint  Patient presents with   Follow-up    Referring provider: [facility], [id number]*  HPI: 35 year old male former smoker followed for severe persistent asthma and ABPA Started on Xolair February 2022-stopped due to possible drug reaction COVID-19 infection February 2022  TEST/EVENTS :  11/03/19 relative eosinophil count of 52% and absolute eosinophils of 5.7.   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  ANCA panel was negative, total IgE was 3840  Eosinophil count after prednisone therapy: Relative eosinophils 10% and absolute eosinophils 0.5.  10/26/2020 Follow up : Asthma and ABPA  Patient presents for a follow-up visit.  He has underlying severe persistent asthma and ABPA.  Is maintained currently on Symbicort, Singulair, Claritin, prednisone 15 mg daily.  He is supposed to be on Spiriva but has not been taking.  We discussed medication compliance.  Previously started on Xolair February 2022 but was stopped due to possible drug reaction.  He has been recommended to change to Dupixent but paperwork is currently pending.  We discussed returning the paperwork as soon as possible. Patient was hospitalized in April for an acute asthma exacerbation.  He was positive for the rhinovirus. Patient says breathing is doing is doing okay , occasional flare of wheezing . Working in May.  Patient has returned back to work.  He works in a [employer]. Activity level is good currently.  Patient is on prednisone 15 mg daily.  We discussed chronic steroid use with patient education. Does have 36 year old so.     Allergies  Allergen Reactions   Amoxicillin Hives    Hives all over the body. 2009 [facility]. Has patient had a PCN reaction causing immediate rash, facial/tongue/throat swelling, SOB or lightheadedness  with hypotension: Yes Has patient had a PCN reaction causing severe rash involving mucus membranes or skin necrosis: No Has patient had a PCN reaction that required hospitalization: No Has patient had a PCN reaction occurring within the last 10 years: Yes If all of the above answers are "NO", then may proceed with Cephalosporin use.   Omalizumab Anaphylaxis    Unclear if this was due to omalizumab as the patient had COVID-19 at the time.  Complaint was that of sore throat, tight throat.   Doxycycline    Fish Allergy Swelling   Shellfish Allergy Swelling   Penicillins Hives    Has patient had a PCN reaction causing immediate rash, facial/tongue/throat swelling, SOB or lightheadedness with hypotension: Yes Has patient had a PCN reaction causing severe rash involving mucus membranes or skin necrosis: No Has patient had a PCN reaction that required hospitalization: No Has patient had a PCN reaction occurring within the last 10 years: Yes If all of the above answers are "NO", then may proceed with Cephalosporin use.    Immunization History  Administered Date(s) Administered   Pneumococcal Polysaccharide-23 10/19/2017    Past Medical History:  Diagnosis Date   Allergic bronchopulmonary aspergillosis (HCC)    Asthma    Chronic bronchitis (HCC)     Tobacco History: Social History   Tobacco Use  Smoking Status Former   Packs/day: 1.00   Years: 14.00   Pack years: 14.00   Types: Cigarettes   Start date: 2006   Quit date: 08/2018   Years since quitting: 2.1  Smokeless Tobacco Never   Counseling  given: Not Answered   Outpatient Medications Prior to Visit  Medication Sig Dispense Refill   albuterol (VENTOLIN HFA) 108 (90 Base) MCG/ACT inhaler Inhale 2 puffs into the lungs every 6 (six) hours as needed for wheezing or shortness of breath. 6.7 g 2   benzonatate (TESSALON PERLES) 100 MG capsule Take 2 capsules (200 mg total) by mouth 3 (three) times daily as needed. 30 capsule 0    EPINEPHrine 0.3 mg/0.3 mL IJ SOAJ injection INJECT 0.3MG  INTO THE MUSCLE AS NEEDED FOR ANAPHYLAXIS 2 each 1   loratadine (CLARITIN) 10 MG tablet Take 1 tablet (10 mg total) by mouth daily.  0   montelukast (SINGULAIR) 10 MG tablet TAKE ONE TABLET BY MOUTH AT BEDTIME 90 tablet 2   pantoprazole (PROTONIX) 40 MG tablet Take 1 tablet (40 mg total) by mouth daily. 30 tablet 0   predniSONE (DELTASONE) 10 MG tablet Take 4 tablets by mouth on day 1 and 2; 3 tabs on day 3 and 4 and 2 tabs on day 5 and 6. Then take 1.5 tabs daily afterwards. 54 tablet 0   predniSONE (DELTASONE) 10 MG tablet TAKE 1 AND 1/2 TABLETS (15MG  TOTAL) BY MOUTH ONCE DAILY. 45 tablet 1   SYMBICORT 160-4.5 MCG/ACT inhaler Inhale 2 puffs into the lungs in the morning and at bedtime. 10.2 g 12   albuterol (ACCUNEB) 1.25 MG/3ML nebulizer solution Take 3 mLs by nebulization every 6 (six) hours as needed for wheezing. 90 mL 0   No facility-administered medications prior to visit.     Review of Systems:   Constitutional:   No  weight loss, night sweats,  Fevers, chills,  +fatigue, or  lassitude.  HEENT:   No headaches,  Difficulty swallowing,  Tooth/dental problems, or  Sore throat,                No sneezing, itching, ear ache,  +nasal congestion, post nasal drip,   CV:  No chest pain,  Orthopnea, PND, swelling in lower extremities, anasarca, dizziness, palpitations, syncope.   GI  No heartburn, indigestion, abdominal pain, nausea, vomiting, diarrhea, change in bowel habits, loss of appetite, bloody stools.   Resp:   No change in color of mucus.  No wheezing.  No chest wall deformity  Skin: no rash or lesions.  GU: no dysuria, change in color of urine, no urgency or frequency.  No flank pain, no hematuria   MS:  No joint pain or swelling.  No decreased range of motion.  No back pain.    Physical Exam  BP 120/80 (BP Location: Left Arm, Patient Position: Sitting, Cuff Size: Normal)   Pulse 82   Temp (!) 97.1 F (36.2 C)  (Temporal)   Ht 5\' 11"  (1.803 m)   Wt 169 lb 12.8 oz (77 kg)   SpO2 99%   BMI 23.68 kg/m   GEN: A/Ox3; pleasant , NAD, well nourished    HEENT:  [place]/AT,  EACs-clear, TMs-wnl, NOSE-clear, THROAT-clear, no lesions, no postnasal drip or exudate noted.   NECK:  Supple w/ fair ROM; no JVD; normal carotid impulses w/o bruits; no thyromegaly or nodules palpated; no lymphadenopathy.    RESP  Clear  P & A; w/o, wheezes/ rales/ or rhonchi. no accessory muscle use, no dullness to percussion  CARD:  RRR, no m/r/g, no peripheral edema, pulses intact, no cyanosis or clubbing.  GI:   Soft & nt; nml bowel sounds; no organomegaly or masses detected.   Musco: Warm bil, no deformities or  joint swelling noted.   Neuro: alert, no focal deficits noted.    Skin: Warm, no lesions or rashes    Lab Results:  CBC    Component Value Date/Time   WBC 4.2 08/31/2020 0500   RBC 4.95 08/31/2020 0500   HGB 15.8 08/31/2020 0500   HCT 45.4 08/31/2020 0500   PLT 297 08/31/2020 0500   MCV 91.7 08/31/2020 0500   MCH 31.9 08/31/2020 0500   MCHC 34.8 08/31/2020 0500   RDW 13.3 08/31/2020 0500   LYMPHSABS 1.0 08/29/2020 1304   MONOABS 0.5 08/29/2020 1304   EOSABS 1.6 (H) 08/29/2020 1304   BASOSABS 0.0 08/29/2020 1304    BMET    Component Value Date/Time   NA 135 08/31/2020 0500   K 4.4 08/31/2020 0500   CL 101 08/31/2020 0500   CO2 24 08/31/2020 0500   GLUCOSE 116 (H) 08/31/2020 0500   BUN 8 08/31/2020 0500   CREATININE 0.77 08/31/2020 0500   CALCIUM 8.6 (L) 08/31/2020 0500   GFRNONAA >60 08/31/2020 0500   GFRAA >60 12/21/2019 0849    BNP No results found for: BNP  ProBNP No results found for: PROBNP  Imaging: No results found.    PFT Results Latest Ref Rng & Units 10/29/2017  FVC-Pre L 5.81  FVC-Predicted Pre % 124  Pre FEV1/FVC % % 41  FEV1-Pre L 2.38  FEV1-Predicted Pre % 61  DLCO uncorrected ml/min/mmHg 35.24  DLCO UNC% % 104  DLCO corrected ml/min/mmHg 34.14  DLCO COR  %Predicted % 101  DLVA Predicted % 97  TLC L 11.87  TLC % Predicted % 166  RV % Predicted % 360    Lab Results  Component Value Date   NITRICOXIDE 95 10/17/2017        Assessment & Plan:   Severe persistent asthma Severe persistent asthma.  Patient is prone to frequent exacerbations and hospitalizations.  Patient education was given on medication compliance.  Patient is to restart Spiriva.  Return Dupixent paperwork. Check PFTs on return  Plan  Patient Instructions  Continue on Symbicort 2 puffs Twice daily   Restart Spiriva 2 puffs daily  Continue on Claritin 10mg  At bedtime  .  Continue on Singulair daily  Continue on Prednisone 15mg  daily  Albuterol inhaler or Neb As needed   Asthma action plan as discussed .   Dupixent paperwork .  Keep Epi Pen with you.  Follow up with Dr. in 2 months  with PFT  Please contact office for sooner follow up if symptoms do not improve or worsen or seek emergency care       Allergic rhinitis Continue on trigger prevention and current regimen  ABPA (allergic bronchopulmonary aspergillosis) (HCC) Continue on current regimen of prednisone.  Patient education on steroids discussed.  Patient is to return Dupixent paperwork.  Once on Dupixent we will discuss lowering prednisone dose.  Medication management Patient is on chronic steroids.  Patient education given on steroids.  Advised to discuss with primary care provider as he will need ongoing screening for possible diabetes, osteoporosis.   I spent   47 minutes dedicated to the care of this patient on the date of this encounter to include pre-visit review of records, face-to-face time with the patient discussing conditions above, post visit ordering of testing, clinical documentation with the electronic health record, making appropriate referrals as documented, and communicating necessary findings to members of the patients care team.    , NP 10/26/2020

## 2020-10-26 NOTE — Assessment & Plan Note (Signed)
Continue on current regimen of prednisone.  Patient education on steroids discussed.  Patient is to return Dupixent paperwork.  Once on Dupixent we will discuss lowering prednisone dose.

## 2020-10-30 ENCOUNTER — Other Ambulatory Visit: Payer: Self-pay

## 2020-10-30 ENCOUNTER — Other Ambulatory Visit: Payer: Self-pay | Admitting: Pulmonary Disease

## 2020-10-30 MED ORDER — EPINEPHRINE 0.3 MG/0.3ML IJ SOAJ
INTRAMUSCULAR | 3 refills | Status: DC
  Filled 2020-10-30: qty 4, 60d supply, fill #0
  Filled 2021-01-30: qty 4, 60d supply, fill #1

## 2020-10-30 MED FILL — Tiotropium Bromide Monohydrate Inhal Aerosol 1.25 MCG/ACT: RESPIRATORY_TRACT | Qty: 4 | Fill #0 | Status: CN

## 2020-11-06 ENCOUNTER — Telehealth: Payer: Self-pay | Admitting: Pulmonary Disease

## 2020-11-06 NOTE — Telephone Encounter (Signed)
Lm for reminder of covid test prior to PFT.   11/08/2020 8:[facility]

## 2020-11-07 NOTE — Telephone Encounter (Signed)
Patient is aware of below date/time and voiced his understanding.

## 2020-11-08 ENCOUNTER — Other Ambulatory Visit
Admission: RE | Admit: 2020-11-08 | Discharge: 2020-11-08 | Disposition: A | Discharge status: Home / Self Care | Payer: Medicaid Other | Source: Ambulatory Visit | Attending: Adult Health | Admitting: Adult Health

## 2020-11-08 ENCOUNTER — Telehealth: Payer: Self-pay | Admitting: Pulmonary Disease

## 2020-11-08 ENCOUNTER — Other Ambulatory Visit: Payer: Self-pay

## 2020-11-08 DIAGNOSIS — Z20822 Contact with and (suspected) exposure to covid-19: Secondary | ICD-10-CM | POA: Insufficient documentation

## 2020-11-08 DIAGNOSIS — Z01812 Encounter for preprocedural laboratory examination: Secondary | ICD-10-CM | POA: Insufficient documentation

## 2020-11-08 LAB — SARS CORONAVIRUS 2 (TAT 6-24 HRS): SARS Coronavirus 2: NEGATIVE

## 2020-11-08 NOTE — Telephone Encounter (Signed)
Please start Dupixent BIV.  Dose: 600mg  at Week 0 and 300mg  every 14 days  Dx: J45.50 (severe persistent asthma)  Current regimen: Spiriva, Symbicort, montelukast  , PharmD, MPH Clinical Pharmacist (Rheumatology and Pulmonology)

## 2020-11-08 NOTE — Telephone Encounter (Signed)
Dupixent forms were mailed to patient in March.  Patient dropped out completed forms today.  Forms have been faxed to pharmacy team.

## 2020-11-09 ENCOUNTER — Ambulatory Visit: Payer: Medicaid Other | Attending: Adult Health

## 2020-11-09 ENCOUNTER — Other Ambulatory Visit (HOSPITAL_COMMUNITY): Payer: Self-pay

## 2020-11-09 NOTE — Telephone Encounter (Signed)
Submitted a Prior Authorization request to  [facility]  for DUPIXENT. Will update once we receive a response.   Confirmation #: [id number]

## 2020-11-13 ENCOUNTER — Other Ambulatory Visit (HOSPITAL_COMMUNITY): Payer: Self-pay

## 2020-11-13 ENCOUNTER — Other Ambulatory Visit: Payer: Self-pay

## 2020-11-13 ENCOUNTER — Telehealth: Payer: Self-pay | Admitting: Pulmonary Disease

## 2020-11-13 MED ORDER — PREDNISONE 10 MG PO TABS
15.0000 mg | ORAL_TABLET | Freq: Every day | ORAL | 0 refills | Status: DC
  Filled 2020-11-13 (×2): qty 45, 30d supply, fill #0

## 2020-11-13 MED FILL — Prednisone Tab 10 MG: ORAL | 30 days supply | Qty: 45 | Fill #0 | Status: CN

## 2020-11-13 NOTE — Telephone Encounter (Signed)
Lm for patient.  

## 2020-11-13 NOTE — Telephone Encounter (Signed)
Received notification from  [facility]  regarding a prior authorization for DUPIXENT. Authorization has been APPROVED from 11/09/2020 to 11/04/2021.   Authorization # [id number]   Test claim revealed that authorization has not yet adjudicated at pharmacy level. Will mark for f/u.

## 2020-11-13 NOTE — Telephone Encounter (Signed)
I have spoke with Shannon Knapp and his Covid Test has been rescheduled for 12/13/20 @ 8:00am and his PFT has been rescheduled on 12/14/20 @ 1:00pm

## 2020-11-13 NOTE — Telephone Encounter (Signed)
Rx for prednisone 15mg  has been sent to preferred pharmacy.  He voiced his understanding and had no further questions.   Patient would like to reschedule PFT.   , please advise. Thanks

## 2020-11-14 ENCOUNTER — Other Ambulatory Visit (HOSPITAL_COMMUNITY): Payer: Self-pay

## 2020-11-14 NOTE — Telephone Encounter (Signed)
New enrollment form sent to patient and prescriber. Prescriber form signed and returned, awaiting patient portion.

## 2020-11-15 ENCOUNTER — Other Ambulatory Visit: Payer: Self-pay

## 2020-11-15 MED FILL — Tiotropium Bromide Monohydrate Inhal Aerosol 1.25 MCG/ACT: RESPIRATORY_TRACT | 90 days supply | Qty: 12 | Fill #0 | Status: AC

## 2020-11-16 ENCOUNTER — Other Ambulatory Visit: Payer: Self-pay

## 2020-11-22 ENCOUNTER — Telehealth: Payer: Self-pay | Admitting: Pharmacist

## 2020-11-22 NOTE — Telephone Encounter (Signed)
Reached out to pt and LVM inquiring on status of Dupixent application. Requested he reach out to the clinic if he needed any help or had any questions.

## 2020-11-22 NOTE — Telephone Encounter (Signed)
Patient approved for medication assistance at [facility] until 08/18/21, as long as eligibility criteria continues to be met.   Vonda [facility] Administrative Assistant 

## 2020-11-23 NOTE — Telephone Encounter (Signed)
Submitted Patient Assistance Application to [employer] for DUPIXENT along with provider portion. Will update patient when we receive a response.  Fax# [phone] Phone# [phone] 

## 2020-11-24 ENCOUNTER — Telehealth: Payer: Self-pay | Admitting: Pulmonary Disease

## 2020-11-24 ENCOUNTER — Other Ambulatory Visit: Payer: Self-pay

## 2020-11-24 MED ORDER — SYMBICORT 160-4.5 MCG/ACT IN AERO
2.0000 | INHALATION_SPRAY | Freq: Two times a day (BID) | RESPIRATORY_TRACT | 12 refills | Status: DC
  Filled 2020-11-24: qty 10.2, 30d supply, fill #0
  Filled 2020-12-25: qty 10.2, 30d supply, fill #1

## 2020-11-24 NOTE — Telephone Encounter (Signed)
Rx for Symbicort has been sent to preferred pharmacy. Left detailed message for patient.  Nothing further needed.

## 2020-11-28 ENCOUNTER — Other Ambulatory Visit: Payer: Self-pay

## 2020-11-28 ENCOUNTER — Telehealth: Payer: Self-pay | Admitting: Pulmonary Disease

## 2020-11-28 DIAGNOSIS — J4550 Severe persistent asthma, uncomplicated: Secondary | ICD-10-CM

## 2020-11-28 MED ORDER — MONTELUKAST SODIUM 10 MG PO TABS
ORAL_TABLET | Freq: Every day | ORAL | 2 refills | Status: DC
  Filled 2020-11-28: qty 90, 90d supply, fill #0
  Filled 2021-03-07: qty 90, 90d supply, fill #1
  Filled 2021-06-27: qty 90, 90d supply, fill #2

## 2020-11-28 MED ORDER — FLUTICASONE PROPIONATE 50 MCG/ACT NA SUSP
1.0000 | Freq: Every day | NASAL | 2 refills | Status: DC
  Filled 2020-11-28: qty 16, 60d supply, fill #0

## 2020-11-28 NOTE — Telephone Encounter (Signed)
Flonase has been sent to preferred pharmacy.  Left detailed message for patient.

## 2020-11-28 NOTE — Telephone Encounter (Signed)
Spoke to patient's friend, [name](DPR), who is requesting refills on Singulair and Flonase.  Singulair has been sent to preferred pharmacy.  Elonda Husky has been updated on Dupixent injections per 11/08/2020 phone note.   Dr. [name], please advise on Flonase?

## 2020-11-29 ENCOUNTER — Other Ambulatory Visit: Payer: Self-pay

## 2020-11-30 NOTE — Telephone Encounter (Signed)
Per rep at [employer], application is still in process.

## 2020-12-11 ENCOUNTER — Telehealth: Payer: Self-pay

## 2020-12-11 NOTE — Telephone Encounter (Signed)
Lvm in regards to pt's upcoming covid test on 7/27. Will try calling him again later.

## 2020-12-11 NOTE — Telephone Encounter (Signed)
Noted by triage.  

## 2020-12-13 ENCOUNTER — Other Ambulatory Visit
Admission: RE | Admit: 2020-12-13 | Discharge: 2020-12-13 | Disposition: A | Discharge status: Home / Self Care | Payer: Medicaid Other | Source: Ambulatory Visit | Attending: Pulmonary Disease | Admitting: Pulmonary Disease

## 2020-12-13 ENCOUNTER — Other Ambulatory Visit: Payer: Self-pay

## 2020-12-13 DIAGNOSIS — Z01812 Encounter for preprocedural laboratory examination: Secondary | ICD-10-CM | POA: Diagnosis not present

## 2020-12-13 DIAGNOSIS — Z20822 Contact with and (suspected) exposure to covid-19: Secondary | ICD-10-CM | POA: Insufficient documentation

## 2020-12-13 LAB — SARS CORONAVIRUS 2 (TAT 6-24 HRS): SARS Coronavirus 2: NEGATIVE

## 2020-12-14 ENCOUNTER — Ambulatory Visit: Payer: Medicaid Other | Attending: Adult Health

## 2020-12-14 DIAGNOSIS — J4550 Severe persistent asthma, uncomplicated: Secondary | ICD-10-CM | POA: Insufficient documentation

## 2020-12-14 MED ORDER — ALBUTEROL SULFATE (2.5 MG/3ML) 0.083% IN NEBU
2.5000 mg | INHALATION_SOLUTION | Freq: Once | RESPIRATORY_TRACT | Status: AC
  Administered 2020-12-14: 2.5 mg via RESPIRATORY_TRACT
  Filled 2020-12-14: qty 3

## 2020-12-14 NOTE — Telephone Encounter (Signed)
Received a fax from [employer] regarding an approval for DUPIXENT patient assistance from 12/12/20 to 12/12/21.   Phone number: [phone]  Patient has PFT scheduled today. Will schedule Dupixent new start once PFT results.   Chesley Mires, PharmD, MPH, BCPS Clinical Pharmacist (Rheumatology and Pulmonology)

## 2020-12-20 ENCOUNTER — Telehealth: Payer: Self-pay | Admitting: Pulmonary Disease

## 2020-12-20 NOTE — Telephone Encounter (Signed)
Routing to pharmacy team. 

## 2020-12-20 NOTE — Telephone Encounter (Signed)
Returned pt's call and LVM that he will need to have his new start visit scheduled. Informed that Emi Belfast is currently out of the office, but once she returns she will be reaching out over the next few days to get scheduled. Advised to reach back out to clinic with any additional questions or concerns.

## 2020-12-21 NOTE — Telephone Encounter (Signed)
Patient scheduled for Dupixent new start on 12/25/20. He received shipment of Dupixent at home on 12/15/20 and has placed it in the fridge. He's been advised that we will use sample for first dose and to keep his medication at home.  Chesley Mires, PharmD, MPH, BCPS Clinical Pharmacist (Rheumatology and Pulmonology)

## 2020-12-25 ENCOUNTER — Other Ambulatory Visit: Payer: Self-pay

## 2020-12-25 ENCOUNTER — Other Ambulatory Visit: Payer: Self-pay | Admitting: Pulmonary Disease

## 2020-12-25 ENCOUNTER — Other Ambulatory Visit: Payer: Medicaid Other | Admitting: Pharmacist

## 2020-12-25 MED ORDER — ALBUTEROL SULFATE HFA 108 (90 BASE) MCG/ACT IN AERS
2.0000 | INHALATION_SPRAY | Freq: Four times a day (QID) | RESPIRATORY_TRACT | 2 refills | Status: DC | PRN
Start: 2020-12-25 — End: 2021-02-05
  Filled 2020-12-25: qty 6.7, 20d supply, fill #0

## 2020-12-25 MED ORDER — PREDNISONE 10 MG PO TABS
15.0000 mg | ORAL_TABLET | Freq: Every day | ORAL | 0 refills | Status: DC
  Filled 2020-12-25: qty 45, 30d supply, fill #0

## 2020-12-25 NOTE — Telephone Encounter (Signed)
Patient scheduled for Dupixent new start on Friday, 12/29/20 at 8am. Patient aware that pharmacy schedule is not open for Friday but he should arrive and ask for pharmacist  Chesley Mires, PharmD, MPH, BCPS Clinical Pharmacist (Rheumatology and Pulmonology)

## 2020-12-25 NOTE — Telephone Encounter (Signed)
[name] fiance states patient needs appointment for pharmacy in the morning. [name] phone number is [phone].

## 2020-12-25 NOTE — Telephone Encounter (Signed)
Patient r/s to 12/29/20 for Dupixent new start.

## 2020-12-26 ENCOUNTER — Other Ambulatory Visit: Payer: Self-pay

## 2020-12-28 NOTE — Progress Notes (Deleted)
HPI Shannon Knapp presents today to [facility] to see pharmacy team for Dupixent new start.  Past medical history includes: ***  Epipen on hand and in date: {yes/no:20286}  Number of hospitalizations in past year: Number of asthma exacerbations in past year:   Respiratory Medications Current regimen: Symbicort 160/4.5 mcg (2 puffs twice daily) Tried in past: *** Patient reports {Adherence challenges yes no:3044014::"adherence challenges","no known adherence challenges"}  OBJECTIVE Allergies  Allergen Reactions   Amoxicillin Hives    Hives all over the body. 2009 [facility] [employer]. Has patient had a PCN reaction causing immediate rash, facial/tongue/throat swelling, SOB or lightheadedness with hypotension: Yes Has patient had a PCN reaction causing severe rash involving mucus membranes or skin necrosis: No Has patient had a PCN reaction that required hospitalization: No Has patient had a PCN reaction occurring within the last 10 years: Yes If all of the above answers are "NO", then may proceed with Cephalosporin use.   Omalizumab Anaphylaxis    Unclear if this was due to omalizumab as the patient had COVID-19 at the time.  Complaint was that of sore throat, tight throat.   Doxycycline    Fish Allergy Swelling   Shellfish Allergy Swelling   Penicillins Hives    Has patient had a PCN reaction causing immediate rash, facial/tongue/throat swelling, SOB or lightheadedness with hypotension: Yes Has patient had a PCN reaction causing severe rash involving mucus membranes or skin necrosis: No Has patient had a PCN reaction that required hospitalization: No Has patient had a PCN reaction occurring within the last 10 years: Yes If all of the above answers are "NO", then may proceed with Cephalosporin use.    Outpatient Encounter Medications as of 12/29/2020  Medication Sig   albuterol (ACCUNEB) 1.25 MG/3ML nebulizer solution Take 3 mLs by nebulization every 6 (six) hours as  needed for wheezing.   albuterol (PROAIR HFA) 108 (90 Base) MCG/ACT inhaler Inhale 2 puffs into the lungs every 6 (six) hours as needed for wheezing or shortness of breath.   benzonatate (TESSALON PERLES) 100 MG capsule Take 2 capsules (200 mg total) by mouth 3 (three) times daily as needed.   EPINEPHrine (EPIPEN 2-PAK) 0.3 mg/0.3 mL IJ SOAJ injection INJECT 0.3MG  INTRAMUSCULARLY AS DIRECTED ONCE AS NEEDED.   EPINEPHrine 0.3 mg/0.3 mL IJ SOAJ injection INJECT 0.3MG  INTO THE MUSCLE AS NEEDED FOR ANAPHYLAXIS   fluticasone (FLONASE) 50 MCG/ACT nasal spray Spray 1 spray into both nostrils once daily.   loratadine (CLARITIN) 10 MG tablet Take 1 tablet (10 mg total) by mouth daily.   montelukast (SINGULAIR) 10 MG tablet TAKE ONE TABLET BY MOUTH ONCE DAILY AT BEDTIME.   pantoprazole (PROTONIX) 40 MG tablet Take 1 tablet (40 mg total) by mouth daily.   predniSONE (DELTASONE) 10 MG tablet Take 4 tablets by mouth on day 1 and 2; 3 tabs on day 3 and 4 and 2 tabs on day 5 and 6. Then take 1.5 tabs daily afterwards.   predniSONE (DELTASONE) 10 MG tablet Take 1.5 tablets (15 mg total) by mouth once daily with breakfast.   SYMBICORT 160-4.5 MCG/ACT inhaler Inhale 2 puffs into the lungs in the morning and at bedtime.   Tiotropium Bromide Monohydrate (SPIRIVA RESPIMAT) 1.25 MCG/ACT AERS INHALE 2 PUFFS INTO THE LUNGS EVERY DAY   No facility-administered encounter medications on file as of 12/29/2020.     Immunization History  Administered Date(s) Administered   Pneumococcal Polysaccharide-23 10/19/2017     PFTs PFT Results Latest Ref Rng &  Units 10/29/2017  FVC-Pre L 5.81  FVC-Predicted Pre % 124  Pre FEV1/FVC % % 41  FEV1-Pre L 2.38  FEV1-Predicted Pre % 61  DLCO uncorrected ml/min/mmHg 35.24  DLCO UNC% % 104  DLCO corrected ml/min/mmHg 34.14  DLCO COR %Predicted % 101  DLVA Predicted % 97  TLC L 11.87  TLC % Predicted % 166  RV % Predicted % 360     Eosinophils Most recent blood eosinophil  count was *** cells/microL taken on ***.   IgE: 6067 on 08/31/20   Assessment   Biologics training for dupilumab (Dupixent)  Goals of therapy: Mechanism: human monoclonal IgG4 antibody that inhibits interleukin-4 and interleukin-13 cytokine-induced responses, including release of proinflammatory cytokines, chemokines, and IgE Reviewed that Dupixent is add-on medication and patient must continue maintenance inhaler regimen. Response to therapy: may take 4 months to determine efficacy. Discussed that patients generally feel improvement sooner than 4 months.  Side effects: injection site reaction (6-18%), antibody development (5-16%), ophthalmic conjunctivitis (2-16%), transient blood eosinophilia (1-2%)  Dose: 600mg  at Week 0 (administered today in clinic) followed by 300mg  every 14 days thereafter  Administration/Storage:  Reviewed administration sites of thigh or abdomen (at least 2-3 inches away from abdomen). Reviewed the upper arm is only appropriate if caregiver is administering injection  Do not shake pen/syringe as this could lead to product foaming or precipitation. Do not use if solution is discolored or contains particulate matter or if window on prefilled pen is yellow (indicates pen has been used).  Reviewed storage of medication in refrigerator. Reviewed that Dupixent can be stored at room temperature in unopened carton for up to 14 days.  Access: Approval of Dupixent through: patient assistance Patient enrolled into copay card program to help with copay assistance.  Medication Reconciliation  A drug regimen assessment was performed, including review of allergies, interactions, disease-state management, dosing and immunization history. Medications were reviewed with the patient, including name, instructions, indication, goals of therapy, potential side effects, importance of adherence, and safe use.  Drug interaction(s): ***  Immunizations  Patient is indicated for the  influenzae, pneumonia, and shingles vaccinations. Patient has received *** COVID19 vaccines.   PLAN Continue Dupixent 300 mg every 14 days.  Next dose is due 01/12/21 and every 14 days thereafter. Rx sent to: [facility]: [phone].  Patient provided with pharmacy phone number and advised to call later this week to schedule shipment to home. Patient provided with copay card information to provide to pharmacy if quoted copay exceeds $5 per month. Continue maintenance asthma regimen of: ***  All questions encouraged and answered.  Instructed patient to reach out with any further questions or concerns.  Thank you for allowing pharmacy to participate in this patient's care.  This appointment required 90 minutes of patient care (this includes precharting, chart review, review of results, face-to-face care, etc.).   01/14/21, PharmD, MPH, BCPS Clinical Pharmacist (Rheumatology and Pulmonology)

## 2020-12-29 ENCOUNTER — Other Ambulatory Visit: Payer: Medicaid Other | Admitting: Pharmacist

## 2020-12-29 NOTE — Telephone Encounter (Signed)
Pharmacy clinic opened up 8 AM slot for patient and he and his partner, Elonda Husky, were advised that pharmacist will make exception and come onsite for this visit since he works third shift and is unable to come in afternoon. Patient was no show to Dupixent new start appointment this AM.  Called patient as reminder of appointment around 8:15am, but unable to reach. Left VM requesting return call to r/s  Chesley Mires, PharmD, MPH, BCPS Clinical Pharmacist (Rheumatology and Pulmonology)

## 2021-01-03 ENCOUNTER — Other Ambulatory Visit: Payer: Self-pay

## 2021-01-08 NOTE — Telephone Encounter (Signed)
Patient r/s for Dupixent new start on 01/12/21 @ 8:30am  Chesley Mires, PharmD, MPH, BCPS Clinical Pharmacist (Rheumatology and Pulmonology)

## 2021-01-09 ENCOUNTER — Telehealth: Payer: Self-pay | Admitting: Pulmonary Disease

## 2021-01-09 NOTE — Telephone Encounter (Signed)
Appt for Friday, 01/12/21 cancelled. Patient will be working 1st shift from 6:30 am to 3:30 pm for the next 3 weeks. It would take them an hour to get to [place]. Elonda Husky has been advised that clinic closes at 4:30 pm and we will have to still monitor him for an hour after injection. She will speak with patient tonight and see if there is any specific day of the week that he can leave work even an hour early and we can bring him during the afternoon  Chesley Mires, PharmD, MPH, BCPS Clinical Pharmacist (Rheumatology and Pulmonology)

## 2021-01-11 NOTE — Telephone Encounter (Signed)
Patient r/s for Dupixent new start on 01/12/21 @ 3pm. He will plan to leave work early at 2pm  Shannon Knapp, PharmD, MPH, BCPS Clinical Pharmacist (Rheumatology and Pulmonology) 

## 2021-01-11 NOTE — Telephone Encounter (Signed)
Patient r/s for Dupixent new start on 01/12/21 @ 3pm. He will plan to leave work early at 2pm  Chesley Mires, PharmD, MPH, BCPS Clinical Pharmacist (Rheumatology and Pulmonology)

## 2021-01-12 ENCOUNTER — Ambulatory Visit: Payer: Medicaid Other | Admitting: Pharmacist

## 2021-01-12 ENCOUNTER — Other Ambulatory Visit: Payer: Medicaid Other | Admitting: Pharmacist

## 2021-01-12 ENCOUNTER — Other Ambulatory Visit: Payer: Self-pay

## 2021-01-12 ENCOUNTER — Encounter: Payer: Self-pay | Admitting: Pharmacist

## 2021-01-12 DIAGNOSIS — J4550 Severe persistent asthma, uncomplicated: Secondary | ICD-10-CM

## 2021-01-12 MED ORDER — DUPIXENT 300 MG/2ML ~~LOC~~ SOAJ: 300.0000 mg | SUBCUTANEOUS | 1 refills | Status: DC

## 2021-01-12 NOTE — Progress Notes (Addendum)
HPI [name] presents today to [facility] accompanied by his partner, Elonda Husky, to see pharmacy team for Dupixent new start.  Past medical history includes severe persistent asthma, bronchitis, ABPA, GERD. \  He was last seen by NP, Tammy Parrett, on 10/26/20 where adding on Dupixent was discussed. He was previously taking Xolair which he started in 07/05/20. On 07/07/20 patient was taken to ED potentially due to anaphylactic reaction to Xolair though this was unclear since patient also was on COVID19 precautions. He was given epinephrine via nebs in ambulance but no IM Epinephrine. They state that 9-1-1 operator advised them to use Epipen but they did not since patient refused at home. Once EMS arrived, they measured his vitals and he was tachycardic. Per their recollection of the conversation, EMS stated that if [name] had administered Epipen, patient may have had medical emergency due to tachycardia.  Epipen on hand and in date: Yes  Respiratory Medications Current regimen:  Symbicort 16/4.74mcg (2 puffs twice daily),  Spiriva (2 puffs once daily), montelukast 10mg  nightly, Flonase daily Tried in past: Xolair (anaphylactic reaction)  OBJECTIVE Allergies  Allergen Reactions   Amoxicillin Hives    Hives all over the body. [facility]. Has patient had a PCN reaction causing immediate rash, facial/tongue/throat swelling, SOB or lightheadedness with hypotension: Yes Has patient had a PCN reaction causing severe rash involving mucus membranes or skin necrosis: No Has patient had a PCN reaction that required hospitalization: No Has patient had a PCN reaction occurring within the last 10 years: Yes If all of the above answers are "NO", then may proceed with Cephalosporin use.   Omalizumab Anaphylaxis    Unclear if this was due to omalizumab as the patient had COVID-19 at the time.  Complaint was that of sore throat, tight throat.   Doxycycline    Fish Allergy Swelling    Shellfish Allergy Swelling   Penicillins Hives    Has patient had a PCN reaction causing immediate rash, facial/tongue/throat swelling, SOB or lightheadedness with hypotension: Yes Has patient had a PCN reaction causing severe rash involving mucus membranes or skin necrosis: No Has patient had a PCN reaction that required hospitalization: No Has patient had a PCN reaction occurring within the last 10 years: Yes If all of the above answers are "NO", then may proceed with Cephalosporin use.    Outpatient Encounter Medications as of 01/12/2021  Medication Sig   albuterol (ACCUNEB) 1.25 MG/3ML nebulizer solution Take 3 mLs by nebulization every 6 (six) hours as needed for wheezing.   albuterol (PROAIR HFA) 108 (90 Base) MCG/ACT inhaler Inhale 2 puffs into the lungs every 6 (six) hours as needed for wheezing or shortness of breath.   benzonatate (TESSALON PERLES) 100 MG capsule Take 2 capsules (200 mg total) by mouth 3 (three) times daily as needed.   EPINEPHrine (EPIPEN 2-PAK) 0.3 mg/0.3 mL IJ SOAJ injection INJECT 0.3MG  INTRAMUSCULARLY AS DIRECTED ONCE AS NEEDED.   EPINEPHrine 0.3 mg/0.3 mL IJ SOAJ injection INJECT 0.3MG  INTO THE MUSCLE AS NEEDED FOR ANAPHYLAXIS   fluticasone (FLONASE) 50 MCG/ACT nasal spray Spray 1 spray into both nostrils once daily.   loratadine (CLARITIN) 10 MG tablet Take 1 tablet (10 mg total) by mouth daily.   montelukast (SINGULAIR) 10 MG tablet TAKE ONE TABLET BY MOUTH ONCE DAILY AT BEDTIME.   pantoprazole (PROTONIX) 40 MG tablet Take 1 tablet (40 mg total) by mouth daily.   predniSONE (DELTASONE) 10 MG tablet Take 4 tablets by mouth on  day 1 and 2; 3 tabs on day 3 and 4 and 2 tabs on day 5 and 6. Then take 1.5 tabs daily afterwards.   predniSONE (DELTASONE) 10 MG tablet Take 1.5 tablets (15 mg total) by mouth once daily with breakfast.   SYMBICORT 160-4.5 MCG/ACT inhaler Inhale 2 puffs into the lungs in the morning and at bedtime.   Tiotropium Bromide Monohydrate  (SPIRIVA RESPIMAT) 1.25 MCG/ACT AERS INHALE 2 PUFFS INTO THE LUNGS EVERY DAY   No facility-administered encounter medications on file as of 01/12/2021.    Immunization History  Administered Date(s) Administered   Pneumococcal Polysaccharide-23 10/19/2017    PFTs PFT Results Latest Ref Rng & Units 10/29/2017  FVC-Pre L 5.81  FVC-Predicted Pre % 124  Pre FEV1/FVC % % 41  FEV1-Pre L 2.38  FEV1-Predicted Pre % 61  DLCO uncorrected ml/min/mmHg 35.24  DLCO UNC% % 104  DLCO corrected ml/min/mmHg 34.14  DLCO COR %Predicted % 101  DLVA Predicted % 97  TLC L 11.87  TLC % Predicted % 166  RV % Predicted % 360    Eosinophils Most recent blood eosinophil count was 1600 cells/microL taken on 08/29/20.   IgE: 6067 on 08/31/20  Assessment   Biologics training for dupilumab (Dupixent)  Goals of therapy: Mechanism: human monoclonal IgG4 antibody that inhibits interleukin-4 and interleukin-13 cytokine-induced responses, including release of proinflammatory cytokines, chemokines, and IgE Reviewed that Dupixent is add-on medication and patient must continue maintenance inhaler regimen. Response to therapy: may take 4 months to determine efficacy. Discussed that patients generally feel improvement sooner than 4 months.  Side effects: injection site reaction (6-18%), antibody development (5-16%), ophthalmic conjunctivitis (2-16%), transient blood eosinophilia (1-2%)  Dose: 600mg  at Week 0 (administered today in clinic) followed by 300mg  every 14 days thereafter  Administration/Storage:  Reviewed administration sites of thigh or abdomen (at least 2-3 inches away from abdomen). Reviewed the upper arm is only appropriate if caregiver is administering injection  Do not shake pen/syringe as this could lead to product foaming or precipitation. Do not use if solution is discolored or contains particulate matter or if window on prefilled pen is yellow (indicates pen has been used).  Reviewed storage of  medication in refrigerator. Reviewed that Dupixent can be stored at room temperature in unopened carton for up to 14 days.  Access: Approval of Dupixent through: patient assistance  Administration: Patient received 600mg  at two divided injections. I administered first injection in left upper thigh so patient and [name] were aware of how autoinjector device works. [name] injected second dose SQ in the back of patient's right arm  Sample used of Dupixent 300mg //47mL autoinjector device x 2 autoinjectors NDC: Lot: Exp: 06/19/2022  Patient monitored for 1 hour . He had no adverse effect.  Medication Reconciliation  A drug regimen assessment was performed, including review of allergies, interactions, disease-state management, dosing and immunization history. Medications were reviewed with the patient, including name, instructions, indication, goals of therapy, potential side effects, importance of adherence, and safe use.  Drug interaction(s): none noted  Patient aware of monitoring for anaphylactic reaction though this would be rare reaction to Dupixent  Immunizations  Patient is indicated for the influenzae. Patient has received no COVID19 vaccines. UTD with pneumonia vaccine. Patient is not eligible for shingles vaccine.  PLAN Continue Dupixent 300mg  every 14 days.  Next dose is due 9/9 and every 14 days thereafter. Rx sent to: [facility]: [phone].  Patient provided with pharmacy phone number. He has already received  4 pens at home from Dupixent and placed in refrigerator. Continue maintenance asthma regimen of: Symbicort 16/4.66mcg (2 puffs twice daily),  Spiriva (2 puffs once daily), montelukast 10mg  nightly, Flonase daily F/u with Dr. on 02/22/21.  All questions encouraged and answered.  Instructed patient to reach out with any further questions or concerns.  Thank you for allowing pharmacy to participate in this patient's care.  This  appointment required 90 minutes of patient care (this includes precharting, chart review, review of results, face-to-face care, etc.).  04/24/21, PharmD, MPH, BCPS Clinical Pharmacist (Rheumatology and Pulmonology)

## 2021-01-12 NOTE — Patient Instructions (Addendum)
Your next Dupixent dose is due on 9/9, 9/23, and every 14 days thereafter (I.e. every OTHER Friday)  CONTINUE Symbicort as prescribed. Spiriva as prescribed. Montelukast as prescribed. Flonase nasal spray as prescribed  Your prescription will be shipped from [facility]. Their phone number is [phone].  Please call to schedule shipment and confirm address when you use your last pen. They will mail your medication to your home.  You will need to be seen by your provider in 3 to 4 months to assess how Dupixent is working for you. Please ensure you have a follow-up appointment scheduled in December 2022. Call our clinic if you need to make this appointment.  How to manage an injection site reaction: Remember the 5 C's: COUNTER - leave on the counter at least 30 minutes but up to overnight to bring medication to room temperature. This may help prevent stinging COLD - place something cold (like an ice gel pack or cold water bottle) on the injection site just before cleansing with alcohol. This may help reduce pain CLARITIN - use Claritin (generic name is loratadine) for the first two weeks of treatment or the day of, the day before, and the day after injecting. This will help to minimize injection site reactions CORTISONE CREAM - apply if injection site is irritated and itching CALL ME - if injection site reaction is bigger than the size of your fist, looks infected, blisters, or if you develop hives  Anaphylactic Reactions  Anaphylactic reactions can occur up to 24 hours after administration.  What are the signs and symptoms of anaphylaxis?  Symptoms can vary from a mild skin reaction to more severe reactions including: Wheezing, shortness of breath, cough, chest tightness or trouble breathing Low blood pressure, dizziness, fainting, rapid of weak heartbeat, anxiety, or feeling of "impending doom" Flushing, itching, hives, or feeling warm Swelling of the throat or tongue, throat  tightness, hoarse voice, or trouble swallowing  Some of these symptoms require immediate treatment, as they can be life threatening.  If you experience severe symptoms which are bolded above use your Epipen and call 911 for immediate emergency care.

## 2021-01-16 ENCOUNTER — Ambulatory Visit: Payer: Medicaid Other | Admitting: Pulmonary Disease

## 2021-01-17 ENCOUNTER — Telehealth: Payer: Self-pay | Admitting: Pharmacist

## 2021-01-17 NOTE — Telephone Encounter (Signed)
01/17/2021 9:57:57 AM - Epi-Pen refill called to [facility]  -- Rhetta Mura - Wednesday, January 17, 2021 9:55 AM --Jeanene Erb [facility] spoke with Elita Quick, placed refill for Epi-Pen allow 5-7 business days for [name] to receive at our clinic. Confirmation # [id number].

## 2021-01-30 ENCOUNTER — Other Ambulatory Visit: Payer: Self-pay

## 2021-01-31 ENCOUNTER — Other Ambulatory Visit: Payer: Self-pay

## 2021-02-05 ENCOUNTER — Other Ambulatory Visit: Payer: Self-pay

## 2021-02-05 ENCOUNTER — Telehealth: Payer: Self-pay | Admitting: Pulmonary Disease

## 2021-02-05 MED ORDER — ALBUTEROL SULFATE HFA 108 (90 BASE) MCG/ACT IN AERS
2.0000 | INHALATION_SPRAY | Freq: Four times a day (QID) | RESPIRATORY_TRACT | 2 refills | Status: DC | PRN
  Filled 2021-02-05: qty 8.5, 25d supply, fill #0

## 2021-02-05 MED ORDER — SYMBICORT 160-4.5 MCG/ACT IN AERO
2.0000 | INHALATION_SPRAY | Freq: Two times a day (BID) | RESPIRATORY_TRACT | 12 refills | Status: DC
  Filled 2021-02-05: qty 10.2, 30d supply, fill #0
  Filled 2021-03-07: qty 10.2, 30d supply, fill #1
  Filled 2021-04-05: qty 10.2, 30d supply, fill #2
  Filled 2021-05-23: qty 10.2, 30d supply, fill #3
  Filled 2021-06-26: qty 30.6, 90d supply, fill #4

## 2021-02-05 MED ORDER — FLUTICASONE PROPIONATE 50 MCG/ACT NA SUSP
1.0000 | Freq: Every day | NASAL | 2 refills | Status: DC
  Filled 2021-02-05: qty 16, 60d supply, fill #0

## 2021-02-05 MED ORDER — SPIRIVA RESPIMAT 1.25 MCG/ACT IN AERS
INHALATION_SPRAY | RESPIRATORY_TRACT | 12 refills | Status: DC
Start: 2021-02-05 — End: 2021-07-18
  Filled 2021-02-05: qty 4, fill #0
  Filled 2021-02-21: qty 12, 90d supply, fill #0

## 2021-02-05 NOTE — Telephone Encounter (Signed)
Symbicort, Spiriva and albuterol has been sent to preferred pharmacy.  Patient is aware and voiced her understanding.  Nothing further needed.

## 2021-02-05 NOTE — Telephone Encounter (Signed)
Spoke to patient, who stated that he spoke with medication management and was advised that proair and ventolin is the same medication. He is okay with proair.  Flonase has been sent to preferred pharmacy as requested by patient.   Nothing further needed at this time.

## 2021-02-06 ENCOUNTER — Other Ambulatory Visit: Payer: Self-pay

## 2021-02-06 ENCOUNTER — Telehealth: Payer: Self-pay | Admitting: Pulmonary Disease

## 2021-02-06 MED ORDER — BENZONATATE 100 MG PO CAPS
100.0000 mg | ORAL_CAPSULE | Freq: Three times a day (TID) | ORAL | 0 refills | Status: DC
  Filled 2021-02-06: qty 30, 10d supply, fill #0

## 2021-02-06 MED ORDER — PREDNISONE 10 MG PO TABS
15.0000 mg | ORAL_TABLET | Freq: Every day | ORAL | 0 refills | Status: DC
  Filled 2021-02-06: qty 45, 30d supply, fill #0

## 2021-02-06 NOTE — Telephone Encounter (Signed)
Make sure he is taking his Dupixent.  Okay to refill Tessalon 100 mg 1 3 times daily as needed cough.  We can start decreasing the prednisone since he is on Dupixent but we can discuss that at his next follow-up appointment which is on 6 October for now continue 15 mg daily.

## 2021-02-06 NOTE — Telephone Encounter (Signed)
Patient is aware of below message and voiced his understanding.  He stated that he is taking Dupixent injections every 2 weeks. Tessalon has been sent to preferred pharmacy.  Nothing further needed at this time.

## 2021-02-06 NOTE — Telephone Encounter (Signed)
Spoke to patient's friend, [name](DPR). Shannon Knapp is requesting a refill on prednisone and tessalon.  Rx sent to preferred pharmacy. Patient is experiencing a dry cough. Cough has been present since Saturday.  Denies f/c/s or additional sx.    Dr. [name], please advise. Thanks.

## 2021-02-07 ENCOUNTER — Other Ambulatory Visit: Payer: Self-pay

## 2021-02-14 ENCOUNTER — Telehealth: Payer: Self-pay | Admitting: Pharmacist

## 2021-02-14 NOTE — Telephone Encounter (Signed)
--   Shannon Knapp - Wednesday, February 14, 2021 10:43 AM --Shannon Knapp for refill on Spiriva, to ship to our office.

## 2021-02-21 ENCOUNTER — Other Ambulatory Visit: Payer: Self-pay

## 2021-02-22 ENCOUNTER — Ambulatory Visit (INDEPENDENT_AMBULATORY_CARE_PROVIDER_SITE_OTHER): Payer: Self-pay | Admitting: Pulmonary Disease

## 2021-02-22 ENCOUNTER — Other Ambulatory Visit: Payer: Self-pay

## 2021-02-22 ENCOUNTER — Encounter: Payer: Self-pay | Admitting: Pulmonary Disease

## 2021-02-22 VITALS — BP 126/78 | HR 100 | Temp 98.0°F | Ht 71.0 in | Wt 165.0 lb

## 2021-02-22 DIAGNOSIS — J4550 Severe persistent asthma, uncomplicated: Secondary | ICD-10-CM

## 2021-02-22 DIAGNOSIS — J3089 Other allergic rhinitis: Secondary | ICD-10-CM

## 2021-02-22 DIAGNOSIS — R053 Chronic cough: Secondary | ICD-10-CM

## 2021-02-22 DIAGNOSIS — B4481 Allergic bronchopulmonary aspergillosis: Secondary | ICD-10-CM

## 2021-02-22 MED ORDER — PREDNISONE 5 MG PO TABS
5.0000 mg | ORAL_TABLET | Freq: Every day | ORAL | 2 refills | Status: DC
  Filled 2021-02-22: qty 30, 30d supply, fill #0
  Filled 2021-04-09: qty 30, 30d supply, fill #1

## 2021-02-22 NOTE — Patient Instructions (Addendum)
It is good to see you doing well today.  After your current prescription of prednisone is completed, the new prescription will be 5 mg daily.  You will stay on this on till you see [name] again this will be in 2 to 3 months.  If you continue to do well we are going to get you off the prednisone altogether.  You do not need to start the Spiriva again.  Continue using your Symbicort and albuterol as needed.

## 2021-02-23 ENCOUNTER — Other Ambulatory Visit: Payer: Self-pay

## 2021-02-28 ENCOUNTER — Encounter: Payer: Self-pay | Admitting: Pulmonary Disease

## 2021-02-28 NOTE — Progress Notes (Signed)
Subjective:    Patient ID: Shannon Knapp, male    DOB: 1986-01-02, 35 y.o.   MRN: [id number] Chief Complaint  Patient presents with   Follow-up    Occ dry cough.     HPI Shannon Knapp is a 35 year old former smoker (quit 2020) who presents for follow-up on severe persistent asthma, eosinophilia and prior ABPA.  This is a scheduled visit.  He is doing very well on Dupixent which he is now administering himself.  He has discontinued Singulair and Spiriva and is doing well.  His current prednisone dosage is grams daily.  This is decreased from 15 mg prior.  He had completed 16 weeks of itraconazole for his ABPA previously.  He does not endorse any dyspnea.  Chronic cough markedly improved with only occasional breakthrough.  When it does occur is dry and nonproductive.  Little to no need for rescue inhalations.  No nocturnal exacerbations.  No visits to the ED since April this year.  Does not not endorse any other symptomatology.  Overall he feels well and looks well.   Review of Systems A 10 point review of systems was performed and it is as noted above otherwise negative.  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/07/2020   Community acquired pneumonia of left lower lobe of lung 10/25/2019   Medication management 04/30/2019   Healthcare maintenance 11/26/2018   Severe persistent asthma 05/28/2018   Acute febrile illness 04/10/2018   Upper airway cough syndrome    Hypoxemia    Syncope     Allergic rhinitis    Cough    Hemoptysis    Protein-calorie malnutrition, severe 10/20/2017   Acute respiratory failure with hypoxemia (HCC) 10/20/2017   Acute respiratory distress    Asthma exacerbation    Status asthmaticus 10/18/2017   Severe persistent acute asthmatic bronchitis 10/17/2017   Elevated blood pressure reading without diagnosis of hypertension 10/17/2017   Respiratory distress 10/17/2017   Social History   Tobacco Use   Smoking status: Former    Packs/day: 1.00    Years: 14.00    Pack years: 14.00    Types: Cigarettes    Start date: 2006    Quit date: 08/2018    Years since quitting: 2.5   Smokeless tobacco: Never  Substance Use Topics   Alcohol use: Not Currently    Alcohol/week: 15.0 standard drinks    Types: 15 Cans of beer per week    Allergies  Allergen Reactions   Amoxicillin Hives    Hives all over body. [facility] in [place], [place]. Has patient had a PCN reaction causing immediate rash, facial/tongue/throat swelling, SOB or lightheadedness with hypotension: Yes Has patient had a PCN reaction causing severe rash involving mucus membranes or skin necrosis: No Has patient had a PCN reaction that required hospitalization: No Has patient had a PCN reaction occurring within the last 10 years: Yes If all of the above answers are "NO", then may proceed with Cephalosporin use.   Omalizumab Anaphylaxis    Unclear if this  was due to omalizumab as the patient had COVID-19 at the time.  Complaint was that of sore throat, tight throat.   Doxycycline    Fish Allergy Swelling   Shellfish Allergy Swelling   Penicillins Hives    Has patient had a PCN reaction causing immediate rash, facial/tongue/throat swelling, SOB or lightheadedness with hypotension: Yes Has patient had a PCN reaction causing severe rash involving mucus membranes or skin necrosis: No Has patient had a PCN reaction that required hospitalization: No Has patient had a PCN reaction  occurring within the last 10 years: Yes If all of the above answers are "NO", then may proceed with Cephalosporin use.   Current Meds  Medication Sig   albuterol (PROAIR HFA) 108 (90 Base) MCG/ACT inhaler Inhale 2 puffs into the lungs every 6 (six) hours as needed for wheezing or shortness of breath.   benzonatate (TESSALON) 100 MG capsule Take 1 capsule (100 mg total) by mouth 3 (three) times daily.   Dupilumab (DUPIXENT) 300 MG/2ML SOPN Inject 300 mg into the skin every 14 (fourteen) days. Loading dose received in clinic on 01/12/21   EPINEPHrine (EPIPEN 2-PAK) 0.3 mg/0.3 mL IJ SOAJ injection INJECT 0.3MG  INTRAMUSCULARLY AS DIRECTED ONCE AS NEEDED.   fluticasone (FLONASE) 50 MCG/ACT nasal spray Spray 1 spray into both nostrils once daily.   loratadine (CLARITIN) 10 MG tablet Take 1 tablet (10 mg total) by mouth daily.   montelukast (SINGULAIR) 10 MG tablet TAKE ONE TABLET BY MOUTH ONCE DAILY AT BEDTIME.   [START ON 03/02/2021] predniSONE (DELTASONE) 5 MG tablet Take 1 tablet (5 mg total) by mouth daily with breakfast.   SYMBICORT 160-4.5 MCG/ACT inhaler Inhale 2 puffs into the lungs in the morning and at bedtime.   [DISCONTINUED] predniSONE (DELTASONE) 10 MG tablet Take 1.5 tablets (15 mg total) by mouth once daily with breakfast.   Immunization History  Administered Date(s) Administered   Moderna Sars-Covid-2 Vaccination 03/24/2020, 05/01/2020   Pneumococcal Polysaccharide-23 10/19/2017       Objective:   Physical Exam BP 126/78 (BP Location: Left Arm, Cuff Size: Normal)   Pulse 100   Temp 98 F (36.7 C) (Temporal)   Ht 5\' 11"  (1.803 m)   Wt 165 lb (74.8 kg)   SpO2 98%   BMI 23.01 kg/m  GENERAL: Well-developed, well-nourished gentleman in no acute distress.  No conversational dyspnea.  Looks fit.  Fully ambulatory. HEAD: Normocephalic, atraumatic.  EYES: Pupils equal, round, reactive to light.  No scleral icterus.  MOUTH:  Nose/mouth/throat not examined due to masking  requirements for COVID 19. NECK: Supple. No thyromegaly. Trachea midline. No JVD.  No adenopathy. PULMONARY: Symmetrical air entry, no adventitious sounds.  No prolonged expiratory phase.  Clear lung fields. CARDIOVASCULAR: S1 and S2.    Regular rate and rhythm.  No rubs, murmurs or gallops heard. ABDOMEN: Benign. MUSCULOSKELETAL: No joint deformity, no clubbing, no edema.  NEUROLOGIC: No overt focal deficit. SKIN: Intact,warm,dry. PSYCH: Mood and behavior normal       Assessment & Plan:     ICD-10-CM   1. Severe persistent asthma without complication  J45.50    Continue Dupixent Continue Symbicort Discontinue Singulair and Spiriva    2. ABPA (allergic bronchopulmonary aspergillosis) (HCC)  B44.81    Continue to wean steroids Prednisone down to 5 mg daily Had received 16 weeks itraconazole previously    3. Perennial allergic rhinitis  J30.89    Continue nasal hygiene    4. Chronic cough  R05.3    Markedly  improved on Dupixent Now occurs rarely     Meds ordered this encounter  Medications   predniSONE (DELTASONE) 5 MG tablet    Sig: Take 1 tablet (5 mg total) by mouth daily with breakfast.    Dispense:  30 tablet    Refill:  2   Shannon Knapp is doing very well.  He is to continue Dupixent.  No need for restarting Spiriva or Singulair.  He will need to continue using Symbicort as maintenance.  We are decreasing his prednisone dosage to 5 mg daily with the intention to taper him off completely from this.  We will see him in follow-up in 2 to 3 months time he is to contact us prior to that time should any new difficulties arise.  Gailen Shelter, MD Advanced Bronchoscopy PCCM [facility]    *This note was dictated using voice recognition software/Dragon.  Despite best efforts to proofread, errors can occur which can change the meaning.  Any change was purely unintentional.

## 2021-03-01 ENCOUNTER — Other Ambulatory Visit: Payer: Self-pay

## 2021-03-01 ENCOUNTER — Telehealth: Payer: Self-pay | Admitting: Pulmonary Disease

## 2021-03-01 NOTE — Telephone Encounter (Signed)
Spoke to patient, who is requesting refill on Symbicort.  Rx was sent to Symbicort on 02/05/2021 with 12 refills.  Patient is aware and voiced his understanding.  Nothing further needed.

## 2021-03-07 ENCOUNTER — Other Ambulatory Visit: Payer: Self-pay

## 2021-03-23 ENCOUNTER — Telehealth: Payer: Self-pay | Admitting: Pulmonary Disease

## 2021-03-23 ENCOUNTER — Other Ambulatory Visit: Payer: Self-pay

## 2021-03-23 ENCOUNTER — Telehealth: Payer: Self-pay | Admitting: Pharmacist

## 2021-03-23 MED ORDER — FLUTICASONE PROPIONATE 50 MCG/ACT NA SUSP
1.0000 | Freq: Every day | NASAL | 2 refills | Status: DC
Start: 2021-03-23 — End: 2021-08-08
  Filled 2021-03-23 – 2021-04-05 (×2): qty 16, 60d supply, fill #0

## 2021-03-23 NOTE — Telephone Encounter (Signed)
Spoke to patient. Patient stated that per pharmacy, it is too early to refill flonase.  Patient stated that he would purchase flonase OTC.  Nothing further needed at this time.

## 2021-03-23 NOTE — Telephone Encounter (Signed)
--   Rhetta Mura - Friday, March 23, 2021 3:52 PM --Jeanene Erb [place] spoke with Rubin Payor to see when Symbicort was last shipped--she stated with the updated system in August, she could not see that any shipment since then, and she has no access to prior to August. Patient enrollment ends 05/10/21--patient can be renewed now--printed renewal--mailing patient his portion to sign & return, also sending provider Dr. Sarina Ser @ [facility] [address] [facility] to sign & return to [name].

## 2021-03-23 NOTE — Telephone Encounter (Signed)
Flonase has been sent to preferred pharmacy.  Left detailed message for patient.  Nothing further needed.

## 2021-04-05 ENCOUNTER — Other Ambulatory Visit: Payer: Self-pay

## 2021-04-06 ENCOUNTER — Telehealth: Payer: Self-pay

## 2021-04-06 NOTE — Telephone Encounter (Signed)
Received symbicort 80 from med management via interoffice Rx signed and sent back via interoffice.

## 2021-04-09 ENCOUNTER — Telehealth: Payer: Self-pay | Admitting: Pulmonary Disease

## 2021-04-09 ENCOUNTER — Other Ambulatory Visit: Payer: Self-pay

## 2021-04-09 ENCOUNTER — Telehealth: Payer: Self-pay | Admitting: Pharmacist

## 2021-04-09 MED ORDER — ALBUTEROL SULFATE HFA 108 (90 BASE) MCG/ACT IN AERS
2.0000 | INHALATION_SPRAY | Freq: Four times a day (QID) | RESPIRATORY_TRACT | 2 refills | Status: DC | PRN
  Filled 2021-04-09: qty 8.5, 25d supply, fill #0

## 2021-04-09 NOTE — Telephone Encounter (Signed)
Spoke to patient, who stated that he was able to get in touch with medication management.  Nothing further needed at this time.

## 2021-04-09 NOTE — Telephone Encounter (Signed)
Spoke to patient, who is requesting refills on Symbicort, Spiriva, prednisone, Flonase and proair.  Rx for Shannon Knapp has been sent to preferred pharmacy. Other medications were recently refilled and have remaining refills. Recommended that he contact pharmacy. Nothing further needed.

## 2021-04-09 NOTE — Telephone Encounter (Signed)
04/09/2021 2:55:09 PM - Symbicort pending  -- Rhetta Mura - Monday, April 09, 2021 2:54 PM --I have received the patient portion of [place] application for Symbicort--holding for provider to return their portion, sent to provider 03/23/2021.

## 2021-04-11 ENCOUNTER — Other Ambulatory Visit: Payer: Self-pay

## 2021-04-11 NOTE — Telephone Encounter (Signed)
Attempted to call pharmacy, pharmacy is closed.

## 2021-04-12 ENCOUNTER — Emergency Department: Payer: Self-pay

## 2021-04-12 ENCOUNTER — Other Ambulatory Visit: Payer: Self-pay

## 2021-04-12 ENCOUNTER — Emergency Department
Admission: EM | Admit: 2021-04-12 | Discharge: 2021-04-12 | Disposition: A | Discharge status: Home / Self Care | Payer: Self-pay | Attending: Emergency Medicine | Admitting: Emergency Medicine

## 2021-04-12 ENCOUNTER — Encounter: Payer: Self-pay | Admitting: Emergency Medicine

## 2021-04-12 DIAGNOSIS — Z7951 Long term (current) use of inhaled steroids: Secondary | ICD-10-CM | POA: Insufficient documentation

## 2021-04-12 DIAGNOSIS — J4551 Severe persistent asthma with (acute) exacerbation: Secondary | ICD-10-CM | POA: Insufficient documentation

## 2021-04-12 DIAGNOSIS — Z87891 Personal history of nicotine dependence: Secondary | ICD-10-CM | POA: Insufficient documentation

## 2021-04-12 DIAGNOSIS — Z20822 Contact with and (suspected) exposure to covid-19: Secondary | ICD-10-CM | POA: Insufficient documentation

## 2021-04-12 DIAGNOSIS — Z8616 Personal history of COVID-19: Secondary | ICD-10-CM | POA: Insufficient documentation

## 2021-04-12 DIAGNOSIS — J40 Bronchitis, not specified as acute or chronic: Secondary | ICD-10-CM | POA: Insufficient documentation

## 2021-04-12 LAB — RESP PANEL BY RT-PCR (FLU A&B, COVID) ARPGX2
Influenza A by PCR: NEGATIVE
Influenza B by PCR: NEGATIVE
SARS Coronavirus 2 by RT PCR: NEGATIVE

## 2021-04-12 MED ORDER — AZITHROMYCIN 250 MG PO TABS
ORAL_TABLET | ORAL | 0 refills | Status: AC
Start: 2021-04-12 — End: 2021-04-21
  Filled 2021-04-12: qty 6, 5d supply, fill #0

## 2021-04-12 MED ORDER — PREDNISONE 20 MG PO TABS
60.0000 mg | ORAL_TABLET | Freq: Once | ORAL | Status: AC
  Administered 2021-04-12: 60 mg via ORAL
  Filled 2021-04-12: qty 3

## 2021-04-12 MED ORDER — PREDNISONE 20 MG PO TABS
60.0000 mg | ORAL_TABLET | Freq: Every day | ORAL | 0 refills | Status: DC
  Filled 2021-04-12: qty 15, 5d supply, fill #0

## 2021-04-12 MED ORDER — ALBUTEROL SULFATE HFA 108 (90 BASE) MCG/ACT IN AERS
2.0000 | INHALATION_SPRAY | Freq: Four times a day (QID) | RESPIRATORY_TRACT | 0 refills | Status: DC | PRN
  Filled 2021-04-12: qty 8, 24d supply, fill #0

## 2021-04-12 MED ORDER — IPRATROPIUM-ALBUTEROL 0.5-2.5 (3) MG/3ML IN SOLN
3.0000 mL | Freq: Once | RESPIRATORY_TRACT | Status: AC
  Administered 2021-04-12: 3 mL via RESPIRATORY_TRACT
  Filled 2021-04-12: qty 3

## 2021-04-12 NOTE — ED Triage Notes (Signed)
Pt comes into the ED via POV c/o cough, sore throat, and body aches.  Pt ambulatory to triage and in NAD with even and unlabored respirations.

## 2021-04-12 NOTE — ED Provider Notes (Signed)
[facility] Emergency Department Provider Note   ____________________________________________   Event Date/Time   First MD Initiated Contact with Patient 04/12/21 1036     (approximate)  I have reviewed the triage vital signs and the nursing notes.   HISTORY  Chief Complaint Sore Throat and Cough    HPI Shannon Knapp is a 35 y.o. male with past medical history of asthma, chronic bronchitis, and GERD who presents to the ED complaining of sore throat and cough.  Patient reports that over the past 4 to 5 days he initially started with a dry cough but it has become more productive of yellowish-green sputum.  Along the same time, he has been dealing with increasing sore throat and pain when swallowing.  He has felt feverish with chills, but has not taken his temperature at home.  He denies any pain in his chest or difficulty breathing, has not had any nausea, vomiting, diarrhea, or dysuria.  He is not aware of any sick contacts.  He has been using his albuterol inhaler at home without significant relief.        Past Medical History:  Diagnosis Date   Allergic bronchopulmonary aspergillosis (HCC)    Asthma    Chronic bronchitis (HCC)     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/07/2020   Community acquired pneumonia of left lower lobe of lung 10/25/2019   Medication management 04/30/2019   Healthcare maintenance 11/26/2018   Severe persistent asthma  05/28/2018   Acute febrile illness 04/10/2018   Upper airway cough syndrome    Hypoxemia    Syncope    Allergic rhinitis    Cough    Hemoptysis    Protein-calorie malnutrition, severe 10/20/2017   Acute respiratory failure with hypoxemia (HCC) 10/20/2017   Acute respiratory distress    Asthma exacerbation    Status asthmaticus 10/18/2017   Severe persistent acute asthmatic bronchitis 10/17/2017   Elevated blood pressure reading without diagnosis of hypertension 10/17/2017   Respiratory distress 10/17/2017    Past Surgical History:  Procedure Laterality Date   VIDEO BRONCHOSCOPY Bilateral 10/24/2017   Procedure: VIDEO BRONCHOSCOPY WITHOUT FLUORO;  Surgeon: Alyson Reedy, MD;  Location: [facility] ENDOSCOPY;  Service: Cardiopulmonary;  Laterality: Bilateral;    Prior to Admission medications   Medication Sig Start Date End Date Taking? Authorizing Provider  albuterol (VENTOLIN HFA) 108 (90 Base) MCG/ACT inhaler Inhale 2 puffs into the lungs every 6 (six) hours as needed for wheezing or shortness of breath. 04/12/21  Yes Chesley Noon, MD  azithromycin (ZITHROMAX Z-PAK) 250 MG tablet Take 2 tablets (500 mg) on  Day 1,  followed by 1 tablet (250 mg) once daily on Days 2 through 5. 04/12/21 04/17/21 Yes Chesley Noon, MD  predniSONE (DELTASONE) 20 MG tablet Take 3 tablets (60 mg total) by mouth daily with breakfast for 5 days. 04/12/21 04/17/21 Yes Chesley Noon, MD  albuterol (PROAIR HFA) 108 [id number] Base) MCG/ACT inhaler Inhale 2 puffs into the lungs once every 6 (six) hours as needed  for wheezing or shortness of breath. 04/09/21   Salena Saner, MD  benzonatate (TESSALON) 100 MG capsule Take 1 capsule (100 mg total) by mouth 3 (three) times daily. 02/06/21   Salena Saner, MD  Dupilumab (DUPIXENT) 300 MG/2ML SOPN Inject 300 mg into the skin every 14 (fourteen) days. Loading dose received in clinic on 01/12/21 01/12/21   Salena Saner, MD  EPINEPHrine (EPIPEN 2-PAK) 0.3 mg/0.3 mL  IJ SOAJ injection INJECT 0.3MG  INTRAMUSCULARLY AS DIRECTED ONCE AS NEEDED. 05/31/20   Salena Saner, MD  fluticasone [facility]) 50 MCG/ACT nasal spray Spray 1 spray into both nostrils once daily. 03/23/21   Salena Saner, MD  loratadine (CLARITIN) 10 MG tablet Take 1 tablet (10 mg total) by mouth daily. 09/01/20   Alford Highland, MD  montelukast (SINGULAIR) 10 MG tablet TAKE ONE TABLET BY MOUTH ONCE DAILY AT BEDTIME. 11/28/20 11/28/21  Salena Saner, MD  predniSONE (DELTASONE) 5 MG tablet Take 1 tablet (5 mg total) by mouth once daily with breakfast. 03/02/21   Salena Saner, MD  SYMBICORT 160-4.5 MCG/ACT inhaler Inhale 2 puffs into the lungs in the morning and at bedtime. 02/05/21   Parrett, Virgel Bouquet, NP  Tiotropium Bromide Monohydrate (SPIRIVA RESPIMAT) 1.25 MCG/ACT AERS INHALE 2 PUFFS INTO THE LUNGS EVERY DAY Patient not taking: Reported on 02/22/2021 02/05/21   Parrett, Virgel Bouquet, NP    Allergies Amoxicillin, Omalizumab, Doxycycline, Fish allergy, Shellfish allergy, and Penicillins  Family History  Problem Relation Age of Onset   Hypertension Mother    Diabetes Mother    Asthma Father    Hypertension Father     Social History Social History   Tobacco Use   Smoking status: Former    Packs/day: 1.00    Years: 14.00    Pack years: 14.00    Types: Cigarettes    Start date: 2006    Quit date: 08/2018    Years since quitting: 2.6   Smokeless tobacco: Never  Vaping Use   Vaping Use: Never used  Substance Use Topics   Alcohol use: Not Currently    Alcohol/week: 15.0 standard drinks    Types: 15 Cans of beer per week   Drug use: No    Review of Systems  Constitutional: Positive for subjective fever/chills Eyes: No visual changes. ENT: Positive for sore throat. Cardiovascular: Denies chest pain. Respiratory: Denies shortness of breath.  Positive for cough. Gastrointestinal: No abdominal pain.  No nausea, no vomiting.  No diarrhea.  No  constipation. Genitourinary: Negative for dysuria. Musculoskeletal: Negative for back pain. Skin: Negative for rash. Neurological: Negative for headaches, focal weakness or numbness.  ____________________________________________   PHYSICAL EXAM:  VITAL SIGNS: ED Triage Vitals  Enc Vitals Group     BP 04/12/21 0941 121/89     Pulse Rate 04/12/21 0941 88     Resp 04/12/21 0941 20     Temp 04/12/21 0941 99.4 F (37.4 C)     Temp Source 04/12/21 0941 Oral     SpO2 04/12/21 0941 99 %     Weight 04/12/21 0911 164 lb 14.5 oz (74.8 kg)     Height 04/12/21 0911 5\' 11"  (1.803 m)     Head Circumference --      Peak Flow --      Pain Score 04/12/21 0910 7     Pain Loc --      Pain Edu? --      Excl. in GC? --  Constitutional: Alert and oriented. Eyes: Conjunctivae are normal. Head: Atraumatic. Nose: No congestion/rhinnorhea. Mouth/Throat: Mucous membranes are moist.  Posterior oropharynx with mild erythema, no edema or exudates noted.  Uvula is midline. Neck: Normal ROM Cardiovascular: Normal rate, regular rhythm. Grossly normal heart sounds.  2+ radial pulses bilaterally. Respiratory: Normal respiratory effort.  No retractions. Lungs with mild end expiratory wheezing. Gastrointestinal: Soft and nontender. No distention. Genitourinary: deferred Musculoskeletal: No lower extremity tenderness nor edema. Neurologic:  Normal speech and language. No gross focal neurologic deficits are appreciated. Skin:  Skin is warm, dry and intact. No rash noted. Psychiatric: Mood and affect are normal. Speech and behavior are normal.  ____________________________________________   LABS (all labs ordered are listed, but only abnormal results are displayed)  Labs Reviewed  RESP PANEL BY RT-PCR (FLU A&B, COVID) [id number]    PROCEDURES  Procedure(s) performed (including Critical Care):  Procedures   ____________________________________________   INITIAL IMPRESSION / ASSESSMENT AND PLAN  / ED COURSE      35 year old male with past medical history of asthma and GERD who presents to the ED with increasing productive cough, subjective fevers and chills, and sore throat over the past 3 to 4 days.  He is not in any respiratory distress and is maintaining O2 sats on room air, does have mild end expiratory wheezing.  Testing for COVID-19 and influenza is negative, chest x-ray reviewed by me and shows no infiltrate, edema, or effusion.  Symptoms sound most consistent with a viral bronchitis and we will treat symptomatically with prednisone and DuoNeb.  Patient is feeling better following DuoNeb and dose of prednisone.  He is appropriate for discharge home with PCP or pulmonology follow-up.  No evidence of pneumonia but given his acute on chronic bronchitis, we will prescribe a course of azithromycin.  He was counseled to return to the ED for new worsening symptoms, patient agrees with plan.      ____________________________________________   FINAL CLINICAL IMPRESSION(S) / ED DIAGNOSES  Final diagnoses:  Bronchitis     ED Discharge Orders          Ordered    predniSONE (DELTASONE) 20 MG tablet  Daily with breakfast        04/12/21 1201    albuterol (VENTOLIN HFA) 108 (90 Base) MCG/ACT inhaler  Every 6 hours PRN       Note to Pharmacy: Please supply with spacer   04/12/21 1201    azithromycin (ZITHROMAX Z-PAK) 250 MG tablet        04/12/21 1201             Note:  This document was prepared using Dragon voice recognition software and may include unintentional dictation errors.    Chesley Noon, MD 04/12/21 1201

## 2021-04-16 ENCOUNTER — Other Ambulatory Visit: Payer: Self-pay

## 2021-04-16 ENCOUNTER — Telehealth: Payer: Self-pay | Admitting: Pharmacist

## 2021-04-16 ENCOUNTER — Telehealth: Payer: Self-pay | Admitting: Pulmonary Disease

## 2021-04-16 MED ORDER — PREDNISONE 10 MG PO TABS
ORAL_TABLET | ORAL | 0 refills | Status: DC
Start: 2021-04-16 — End: 2021-05-22
  Filled 2021-04-16: qty 21, 6d supply, fill #0

## 2021-04-16 MED ORDER — PREDNISONE 10 MG PO TABS
10.0000 mg | ORAL_TABLET | Freq: Every day | ORAL | 0 refills | Status: DC
  Filled 2021-04-16: qty 30, 30d supply, fill #0

## 2021-04-16 NOTE — Telephone Encounter (Signed)
04/16/2021 8:42:10 AM - [place] to [place]  -- Rhetta Mura - Monday, April 16, 2021 8:41 AM --Migdalia Dk renewal for Symbicort 160/4.5--ID# [id number]

## 2021-04-16 NOTE — Telephone Encounter (Signed)
Patient is aware of below message and voiced his understanding.  Prednisone taper and 10mg  tablets sent to preferred pharmacy.  Appt scheduled for 04/26/2021 at 11:00. Nothing further needed at this time.

## 2021-04-16 NOTE — Telephone Encounter (Signed)
Lets prescribe a proper taper pack of prednisone, then after he finishes the taper pack he will stay on 10 mg of prednisone daily we can prescribe a refill.  And do take the Azithromycin.  He needs to see me at and/or the nurse practitioner in 2 to 3 weeks.  He is to contact us if he is no better.

## 2021-04-16 NOTE — Telephone Encounter (Signed)
Spoke to patient.  Patient stated that he was seen at ED on 04/12/2021 for bronchitis.  He was prescribed an abx but he was unable to pickup Rx due to pharmacy being closed.  C/o chest tightness, prod cough with yellow to green sputum, sore throat, wheezing and sob with coughing spells. Sx started 04/09/2021 He is taking dupixent every 2 weeks, albuterol solution 4-5x daily, prednisone 5mg , albuterol HFA 4-5x daily, symbicort BID and spiriva once daily.  He plans to pickup zithromycin and prednisone 60mg  today.   He increased prednisone to 35mg , 25mg  then 15mg  as instructed by ED. He is now out of prednisone.   Dr. , please advise. Thanks

## 2021-04-26 ENCOUNTER — Ambulatory Visit: Payer: Medicaid Other | Admitting: Pulmonary Disease

## 2021-05-07 ENCOUNTER — Telehealth: Payer: Self-pay | Admitting: Pharmacist

## 2021-05-07 NOTE — Telephone Encounter (Signed)
05/07/2021 10:35:46 AM - Epi-Pen renewal to pat. & dr  -- Shannon Knapp - Monday, May 07, 2021 10:34 AM --Mailing [employer] renewal application for Epi-Pen to patient & Interoffice to provider to sign & return.

## 2021-05-10 ENCOUNTER — Telehealth: Payer: Self-pay | Admitting: Pulmonary Disease

## 2021-05-10 NOTE — Telephone Encounter (Signed)
Epipen Rx has bene signed and sent to med management via inter office.

## 2021-05-22 ENCOUNTER — Telehealth: Payer: Self-pay | Admitting: Pulmonary Disease

## 2021-05-22 ENCOUNTER — Other Ambulatory Visit: Payer: Self-pay

## 2021-05-22 MED ORDER — PREDNISONE 10 MG PO TABS
10.0000 mg | ORAL_TABLET | Freq: Every day | ORAL | 0 refills | Status: DC
  Filled 2021-05-22: qty 40, 40d supply, fill #0

## 2021-05-22 NOTE — Telephone Encounter (Signed)
Prednisone 10mg  has been sent to preferred pharmacy.  Patient is aware and voiced his understanding.  Nothing further needed at this time.

## 2021-05-23 ENCOUNTER — Other Ambulatory Visit: Payer: Self-pay

## 2021-06-08 ENCOUNTER — Other Ambulatory Visit: Payer: Self-pay

## 2021-06-13 ENCOUNTER — Other Ambulatory Visit: Payer: Self-pay

## 2021-06-13 ENCOUNTER — Telehealth: Payer: Self-pay | Admitting: Pharmacist

## 2021-06-13 NOTE — Telephone Encounter (Signed)
06/13/2021 10:37:15 AM - Symbicort call to [employer] -- Arletha Pili - Wednesday, June 13, 2021 10:30 AM --   Called [employer] to follow up on Symbicort160/4.27mcg. Spoke to [place]. She stated Appeal was approved 05-06-21 but some reason had not been shipped. She will expedite shipment . Estimated receive date 06-19-21. Reference# [id number].

## 2021-06-25 ENCOUNTER — Ambulatory Visit: Payer: Medicaid Other | Admitting: Pulmonary Disease

## 2021-06-26 ENCOUNTER — Other Ambulatory Visit: Payer: Self-pay

## 2021-06-27 ENCOUNTER — Other Ambulatory Visit: Payer: Self-pay | Admitting: Pulmonary Disease

## 2021-06-27 ENCOUNTER — Other Ambulatory Visit: Payer: Self-pay

## 2021-06-27 ENCOUNTER — Telehealth: Payer: Self-pay | Admitting: Pulmonary Disease

## 2021-06-27 MED ORDER — ALBUTEROL SULFATE HFA 108 (90 BASE) MCG/ACT IN AERS
2.0000 | INHALATION_SPRAY | Freq: Four times a day (QID) | RESPIRATORY_TRACT | 0 refills | Status: DC | PRN
  Filled 2021-06-27: qty 8.5, 25d supply, fill #0

## 2021-06-27 MED ORDER — SYMBICORT 160-4.5 MCG/ACT IN AERO: 2.0000 | INHALATION_SPRAY | Freq: Two times a day (BID) | RESPIRATORY_TRACT | 0 refills | Status: DC

## 2021-06-27 MED FILL — Prednisone Tab 10 MG: ORAL | 30 days supply | Qty: 30 | Fill #0 | Status: AC

## 2021-06-27 NOTE — Telephone Encounter (Signed)
Prednisone and Symbicort has been sent to preferred pharmacy. He was instructed to stop Singulair at last OV.  He is aware and voiced his understanding.  Nothing further needed at this time.

## 2021-07-18 ENCOUNTER — Other Ambulatory Visit: Payer: Self-pay

## 2021-07-18 ENCOUNTER — Encounter: Payer: Self-pay | Admitting: Primary Care

## 2021-07-18 ENCOUNTER — Ambulatory Visit (INDEPENDENT_AMBULATORY_CARE_PROVIDER_SITE_OTHER): Payer: Self-pay | Admitting: Primary Care

## 2021-07-18 DIAGNOSIS — J309 Allergic rhinitis, unspecified: Secondary | ICD-10-CM

## 2021-07-18 DIAGNOSIS — J4550 Severe persistent asthma, uncomplicated: Secondary | ICD-10-CM

## 2021-07-18 MED ORDER — LEVOFLOXACIN 500 MG PO TABS
500.0000 mg | ORAL_TABLET | Freq: Every day | ORAL | 0 refills | Status: DC
  Filled 2021-07-18: qty 7, 7d supply, fill #0

## 2021-07-18 MED ORDER — PREDNISONE 10 MG PO TABS
ORAL_TABLET | ORAL | 0 refills | Status: DC
  Filled 2021-07-18 – 2021-07-19 (×2): qty 14, 7d supply, fill #0

## 2021-07-18 NOTE — Assessment & Plan Note (Signed)
-   Severe persistent asthma. Last exacerbation was in November 2022. Currently has flare secondary to sinobronchitis. He is compliant with Symbicort 160 2 puffs BID, Dupixent q 2 weeks, Singulair and prednisone 10mg  daily. Attempted to decreased prednisone to 5mg  in October but was unsuccessful. Consider retrying at next visit. Sending in RX Levaquin 500mg  qd x 7 days and prednisone 20mg  x 7 days. FU virtual visit 2 weeks.  ?

## 2021-07-18 NOTE — Assessment & Plan Note (Signed)
-   Continue Flonase nasal spray 1 puff per nostril daily ?- Advised he start netti pot once daily as needed for congestion  ?

## 2021-07-18 NOTE — Progress Notes (Signed)
@Patient  ID: , male    DOB: 09-Jan-1986, 36 y.o.   MRN: [id number]  Chief Complaint  Patient presents with   Follow-up    Referring provider: [facility], [id number] Co*  HPI: 36 year old male, former smoker (No vaping or marijuana). PMH significant for severe persistent asthma steroid dependent, ABPA, allergic rhinitis. Patient of Dr. 30, last seen 02/22/21. Maintained on Symbicort, prn Albuterol, Dupixent. Attempted to decreased prednisone to 5mg  in October but was unsuccessful.   Previous [facility] pulmonary encounter: 04/25/2020  Patient presents today for 2 month follow-up. He is doing well today. No recent exacerbations. He was admitted to the hospital in November 2021 for 5 days for asthma exacerbation. He followed up with Dr. 14/11/2019 on 04/04/20. He was instructed to stay on prednisone 10mg  daily until he can start Xolair which has been on hold for several months because he can not afford Epipen. We have provided him with patient assistance paperwork multiple times. He has since established with community health and wellness. He had an apt on November 22nd with 04/06/20.   He has been using Dulera 200 as substitute for Symbicort until he gets PA. He remains on prednisone 10mg  daily, needs refill. He has patient assistance paperwork with him today for Epipen and Symbicort. He gets reflux at night which causes cough and shortness of breath symptoms.   02/22/21- Dr. November 24 is a 36 year old former smoker (quit 2020) who presents for follow-up on severe persistent asthma, eosinophilia and prior ABPA.  This is a scheduled visit.  He is doing very well on Dupixent which he is now administering himself.  He has discontinued Singulair and Spiriva and is doing well.  His current prednisone dosage is grams daily.  This is decreased from 15 mg prior.  He had completed 16 weeks of itraconazole for his ABPA previously.  He does not endorse any dyspnea.  Chronic cough  markedly improved with only occasional breakthrough.  When it does occur is dry and nonproductive.  Little to no need for rescue inhalations.  No nocturnal exacerbations.  No visits to the ED since April this year.  Does not not endorse any other symptomatology.  Overall he feels well and looks well.   07/18/2021- Interim hx  During his last visit in October 2022 prednisone was decreased from 10mg  to 5mg  daily. The plan was to discontinue altogether if doing well. He was seen in ED in November and Dr. May prescribed prednisone taper and told him to stay on 10mg  daily and to follow-up in 2-3 weeks. He has not been seen by our office since then, he has cancelled two appointments and no showed one. Today he is dealing with a lot of nasal congestion. He has a productive cough with thick yellow mucus. He is compliant with Symbicort 160 two puffs twice daily, taking dupixent injection every 2 weeks, singulair and prednisone 10mg . He uses flonase once daily in the morning.  He has not been on any abx in the last month.   TEST/EVENTS :  11/03/19 relative eosinophil count of 52% and absolute eosinophils of 5.7.   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   ANCA panel was negative, total IgE was 3840  Eosinophil count after prednisone therapy: Relative eosinophils 10% and absolute eosinophils 0.5.  Imaging: CT maxillofacial showed large carie of the first right mandibular molar, also noted moderate right facial soft tissue swelling located  anterior to the right mandible, consistent with odontogenic cellulitis, diffuse paranasal sinus disease with complete opacification of the ethmoid and frontal sinuses   Allergies  Allergen Reactions   Amoxicillin Hives    Hives all over body. 2009-[facility] in [place], [place]. Has patient had a PCN reaction causing immediate rash, facial/tongue/throat swelling, SOB or lightheadedness  with hypotension: Yes Has patient had a PCN reaction causing severe rash involving mucus membranes or skin necrosis: No Has patient had a PCN reaction that required hospitalization: No Has patient had a PCN reaction occurring within the last 10 years: Yes If all of the above answers are "NO", then may proceed with Cephalosporin use.   Omalizumab Anaphylaxis    Unclear if this was due to omalizumab as the patient had COVID-19 at the time.  Complaint was that of sore throat, tight throat.   Doxycycline    Fish Allergy Swelling   Shellfish Allergy Swelling   Penicillins Hives    Has patient had a PCN reaction causing immediate rash, facial/tongue/throat swelling, SOB or lightheadedness with hypotension: Yes Has patient had a PCN reaction causing severe rash involving mucus membranes or skin necrosis: No Has patient had a PCN reaction that required hospitalization: No Has patient had a PCN reaction occurring within the last 10 years: Yes If all of the above answers are "NO", then may proceed with Cephalosporin use.    Immunization History  Administered Date(s) Administered   [employer] Vaccination 03/24/2020, 05/01/2020   Pneumococcal Polysaccharide-23 10/19/2017    Past Medical History:  Diagnosis Date   Allergic bronchopulmonary aspergillosis (HCC)    Asthma    Chronic bronchitis (HCC)     Tobacco History: Social History   Tobacco Use  Smoking Status Former   Packs/day: 1.00   Years: 14.00   Pack years: 14.00   Types: Cigarettes   Start date: 2006   Quit date: 08/2018   Years since quitting: 2.9  Smokeless Tobacco Never   Counseling given: Not Answered   Outpatient Medications Prior to Visit  Medication Sig Dispense Refill   albuterol (PROAIR HFA) 108 (90 Base) MCG/ACT inhaler Inhale 2 puffs into the lungs every 6 (six) hours as needed for wheezing or shortness of breath. 8.5 g 0   benzonatate (TESSALON) 100 MG capsule Take 1 capsule (100 mg total) by mouth  3 (three) times daily. 30 capsule 0   Dupilumab (DUPIXENT) 300 MG/2ML SOPN Inject 300 mg into the skin every 14 (fourteen) days. Loading dose received in clinic on 01/12/21 12 mL 1   EPINEPHrine (EPIPEN 2-PAK) 0.3 mg/0.3 mL IJ SOAJ injection INJECT 0.3MG  INTRAMUSCULARLY AS DIRECTED ONCE AS NEEDED. 4 each 3   fluticasone (FLONASE) 50 MCG/ACT nasal spray Spray 1 spray into both nostrils once daily. 16 g 2   loratadine (CLARITIN) 10 MG tablet Take 1 tablet (10 mg total) by mouth daily.  0   montelukast (SINGULAIR) 10 MG tablet TAKE ONE TABLET BY MOUTH ONCE DAILY AT BEDTIME. 90 tablet 2   SYMBICORT 160-4.5 MCG/ACT inhaler Inhale 2 puffs into the lungs in the morning and at bedtime. 10.2 g 12   predniSONE (DELTASONE) 10 MG tablet Take 1 tablet (10 mg total) by mouth once daily with breakfast. 30 tablet 0   SYMBICORT 160-4.5 MCG/ACT inhaler Inhale 2 puffs into the lungs in the morning and at bedtime. 10.2 g 0   Tiotropium Bromide Monohydrate (SPIRIVA RESPIMAT) 1.25 MCG/ACT AERS INHALE 2 PUFFS INTO THE LUNGS EVERY DAY 4 g 12  No facility-administered medications prior to visit.    Review of Systems  Review of Systems  Constitutional: Negative.   HENT:  Positive for congestion and postnasal drip.   Respiratory:  Positive for cough and wheezing.   Cardiovascular: Negative.     Physical Exam  BP 124/80 (BP Location: Left Arm, Patient Position: Sitting, Cuff Size: Normal)    Pulse 94    Temp 97.7 F (36.5 C) (Oral)    Ht 5\' 11"  (1.803 m)    Wt 166 lb (75.3 kg)    SpO2 98%    BMI 23.15 kg/m  Physical Exam Constitutional:      Appearance: Normal appearance.  HENT:     Head: Normocephalic and atraumatic.     Mouth/Throat:     Mouth: Mucous membranes are moist.     Pharynx: Oropharynx is clear.  Cardiovascular:     Rate and Rhythm: Normal rate and regular rhythm.  Pulmonary:     Effort: Pulmonary effort is normal.     Breath sounds: Rhonchi present. No wheezing or rales.  Musculoskeletal:         General: Normal range of motion.  Skin:    General: Skin is warm and dry.  Neurological:     General: No focal deficit present.     Mental Status: He is alert and oriented to person, place, and time. Mental status is at baseline.  Psychiatric:        Mood and Affect: Mood normal.        Behavior: Behavior normal.        Thought Content: Thought content normal.        Judgment: Judgment normal.     Lab Results:  CBC    Component Value Date/Time   WBC 4.2 08/31/2020 0500   RBC 4.95 08/31/2020 0500   HGB 15.8 08/31/2020 0500   HCT 45.4 08/31/2020 0500   PLT 297 08/31/2020 0500   MCV 91.7 08/31/2020 0500   MCH 31.9 08/31/2020 0500   MCHC 34.8 08/31/2020 0500   RDW 13.3 08/31/2020 0500   LYMPHSABS 1.0 08/29/2020 1304   MONOABS 0.5 08/29/2020 1304   EOSABS 1.6 (H) 08/29/2020 1304   BASOSABS 0.0 08/29/2020 1304    BMET    Component Value Date/Time   NA 135 08/31/2020 0500   K 4.4 08/31/2020 0500   CL 101 08/31/2020 0500   CO2 24 08/31/2020 0500   GLUCOSE 116 (H) 08/31/2020 0500   BUN 8 08/31/2020 0500   CREATININE 0.77 08/31/2020 0500   CALCIUM 8.6 (L) 08/31/2020 0500   GFRNONAA >60 08/31/2020 0500   GFRAA >60 12/21/2019 0849    BNP No results found for: BNP  ProBNP No results found for: PROBNP  Imaging: No results found.   Assessment & Plan:   Severe persistent acute asthmatic bronchitis - Severe persistent asthma. Last exacerbation was in November 2022. Currently has flare secondary to sinobronchitis. He is compliant with Symbicort 160 2 puffs BID, Dupixent q 2 weeks, Singulair and prednisone 10mg  daily. Attempted to decreased prednisone to 5mg  in October but was unsuccessful. Consider retrying at next visit. Sending in RX Levaquin 500mg  qd x 7 days and prednisone 20mg  x 7 days. FU virtual visit 2 weeks.   Allergic rhinitis - Continue Flonase nasal spray 1 puff per nostril daily - Advised he start netti pot once daily as needed for congestion   40  mins spent on case: > 50% face to face with patient   Earnstine Regal  Clent RidgesWalsh, NP 07/18/2021

## 2021-07-18 NOTE — Assessment & Plan Note (Deleted)
-   Severe persistent asthma. Last exacerbation was in November 2022. Currently has flare secondary to sinobronchitis. He is compliant with Symbicort 160 2 puffs BID, Dupixent q 2 weeks, Singulair and prednisone 10mg  daily. Attempted to decreased prednisone to 5mg  in October but was unsuccessful.  ?

## 2021-07-18 NOTE — Patient Instructions (Addendum)
Recommendations: ?Continue Symbicort 160 two puffs morning and evening ?Use albuterol every 4-6 hours for breakthrough shortness of breath/wheezing ?Try using netti pot once a day as needed for sinus congestion  ?Start mucinex 1,200mg  twice daily x 1-2 weeks until congestion is better ?Continue Singulair 10 mg at bedtime ?Continue Claritin 10mg  daily  ? ?Rx: ?Levaquin once daily x 7 days  ?Prednisone 20mg  x 1 week; then resume 10mg  daily ? ?Follow-up: ?2-4 weeks virtual visit with Eustaquio Maize NP  ? ? ?

## 2021-07-19 ENCOUNTER — Other Ambulatory Visit: Payer: Self-pay

## 2021-07-19 ENCOUNTER — Telehealth: Payer: Medicaid Other | Admitting: Nurse Practitioner

## 2021-08-02 ENCOUNTER — Other Ambulatory Visit: Payer: Self-pay

## 2021-08-02 ENCOUNTER — Telehealth: Payer: Self-pay | Admitting: Pulmonary Disease

## 2021-08-02 MED ORDER — PREDNISONE 10 MG PO TABS
10.0000 mg | ORAL_TABLET | Freq: Every day | ORAL | 5 refills | Status: DC
  Filled 2021-08-02: qty 30, 30d supply, fill #0
  Filled 2021-09-03: qty 30, 30d supply, fill #1
  Filled 2021-11-27: qty 30, 30d supply, fill #0

## 2021-08-02 NOTE — Telephone Encounter (Signed)
Prednisone 10mg  has been sent to preferred pharmacy.  ?Patient is aware and voiced his understanding.  ?Nothing further needed.  ?

## 2021-08-08 ENCOUNTER — Telehealth (INDEPENDENT_AMBULATORY_CARE_PROVIDER_SITE_OTHER): Payer: Self-pay | Admitting: Primary Care

## 2021-08-08 ENCOUNTER — Other Ambulatory Visit: Payer: Self-pay

## 2021-08-08 ENCOUNTER — Encounter: Payer: Self-pay | Admitting: Primary Care

## 2021-08-08 DIAGNOSIS — J4550 Severe persistent asthma, uncomplicated: Secondary | ICD-10-CM

## 2021-08-08 MED ORDER — SYMBICORT 160-4.5 MCG/ACT IN AERO
2.0000 | INHALATION_SPRAY | Freq: Two times a day (BID) | RESPIRATORY_TRACT | 12 refills | Status: DC
  Filled 2021-08-08: qty 10.2, 30d supply, fill #0
  Filled 2021-09-18: qty 30.6, 90d supply, fill #0
  Filled ????-??-??: fill #0

## 2021-08-08 MED ORDER — FLUTICASONE PROPIONATE 50 MCG/ACT NA SUSP
1.0000 | Freq: Every day | NASAL | 2 refills | Status: DC
  Filled 2021-08-08 – 2021-11-27 (×2): qty 16, 60d supply, fill #0

## 2021-08-08 NOTE — Progress Notes (Signed)
?Virtual Visit via Video Note ? ?I connected with Shannon Knapp on 08/08/21 at  9:00 AM EDT by a video enabled telemedicine application and verified that I am speaking with the correct person using two identifiers. ? ?Location: ?Patient: Home ?Provider: Office ?  ?I discussed the limitations of evaluation and management by telemedicine and the availability of in person appointments. The patient expressed understanding and agreed to proceed. ? ?History of Present Illness: ?36 year old male, former smoker (No vaping or marijuana). PMH significant for severe persistent asthma steroid dependent, ABPA, allergic rhinitis. Patient of Dr. Jayme Knapp, last seen 02/22/21. Maintained on Symbicort, prn Albuterol, Dupixent. Attempted to decreased prednisone to 5mg  in October but was unsuccessful.  ? ?Previous [facility] pulmonary encounter: ?04/25/2020  ?Patient presents today for 2 month follow-up. He is doing well today. No recent exacerbations. He was admitted to the hospital in November 2021 for 5 days for asthma exacerbation. He followed up with Dr. December 2021 on 04/04/20. He was instructed to stay on prednisone 10mg  daily until he can start Xolair which has been on hold for several months because he can not afford Epipen. We have provided him with patient assistance paperwork multiple times. He has since established with community health and wellness. He had an apt on November 22nd with .  ? ?He has been using Dulera 200 as substitute for Symbicort until he gets PA. He remains on prednisone 10mg  daily, needs refill. He has patient assistance paperwork with him today for Epipen and Symbicort. He gets reflux at night which causes cough and shortness of breath symptoms.  ? ?02/22/21- Dr. [name] ?Shannon Knapp is a 36 year old former smoker (quit 2020) who presents for follow-up on severe persistent asthma, eosinophilia and prior ABPA.  This is a scheduled visit.  He is doing very well on Dupixent which he is now  administering himself.  He has discontinued Singulair and Spiriva and is doing well.  His current prednisone dosage is grams daily.  This is decreased from 15 mg prior.  He had completed 16 weeks of itraconazole for his ABPA previously.  He does not endorse any dyspnea.  Chronic cough markedly improved with only occasional breakthrough.  When it does occur is dry and nonproductive.  Little to no need for rescue inhalations.  No nocturnal exacerbations.  No visits to the ED since April this year.  Does not not endorse any other symptomatology. ? ?Overall he feels well and looks well. ? ? ?07/18/2021 ?During his last visit in October 2022 prednisone was decreased from 10mg  to 5mg  daily. The plan was to discontinue altogether if doing well. He was seen in ED in November and Dr. 09/17/2021 prescribed prednisone taper and told him to stay on 10mg  daily and to follow-up in 2-3 weeks. He has not been seen by our office since then, he has cancelled two appointments and no showed one. Today he is dealing with a lot of nasal congestion. He has a productive cough with thick yellow mucus. He is compliant with Symbicort 160 two puffs twice daily, taking dupixent injection every 2 weeks, singulair and prednisone 10mg . He uses flonase once daily in the morning.  He has not been on any abx in the last month. ? ?08/08/2021- Interim hx ?Patient contacted today for virtual video visit. Unable to connect to video visit, contacted by telephone. Treated for acute asthma exacerbation secondary to sinobronchitis with levaquin and prednisone course on 07/18/21. He is doing well. Breathing is fine. Currently dealing with some nasal  congestion. He ran out of fluticasone nasal spray and Symbicort yesterday. He is compliant with Symbicort 160, Dupixent, Singulair and prednisone 10mg  daily. Previously unsuccessful at decreasing daily prednisone. ? ?Asthma exacerbations: ?November 2022 ?March 2023  ? ? ?Observations/Objective: ? ?- Able to speak in full  sentences  ? ?Assessment and Plan: ? ?Severe persistent asthma: ?- Last exacerbation was March 1st treated with Levaquin/prednisone 20mg  x 7 days ?- Currently asthma symptoms are stable, having some sinus congestion ?- Continue Symbicort 160, PRN Albuterol, Dupixent q 14 days and prednisone 10mg  daily ?- Will attempt to decrease daily prednisone at next visit  ? ?Allergic rhinitis ?- Continue Claritin and Singulair ?- Refill fluticasone nasal spray ?- Advise patient use netti pot once daily as needed for nasal congestion  ? ?Follow Up Instructions: ? ?3 months with Dr. 12[birth year]07[birth year]1985 ?  ?I discussed the assessment and treatment plan with the patient. The patient was provided an opportunity to ask questions and all were answered. The patient agreed with the plan and demonstrated an understanding of the instructions. ?  ?The patient was advised to call back or seek an in-person evaluation if the symptoms worsen or if the condition fails to improve as anticipated. ? ?I provided 22 minutes of non-face-to-face time during this encounter. ? ? ? , NP ? ?

## 2021-08-08 NOTE — Patient Instructions (Addendum)
Continue Symbicort 160 two puffs morning and evening ?Use albuterol every 4-6 hours for breakthrough shortness of breath/wheezing ?Continue Singulair 10 mg at bedtime ?Continue Claritin 10mg  daily  ?Continue netti pot daily as needed for nasal congestion ?Take mucinex 600mg  twice  ?Continue Dupixent injections ?Decreased prednisone to 5mg  daily  ? ?Follow-up: ?8-12 weeks or sooner if needed  ?

## 2021-08-09 ENCOUNTER — Other Ambulatory Visit: Payer: Self-pay

## 2021-08-10 NOTE — Progress Notes (Signed)
Agree with the details of the visit as noted by Elizabeth Walsh, NP.  C. Laura Jalayla Chrismer, MD [facility] 

## 2021-08-17 ENCOUNTER — Other Ambulatory Visit: Payer: Self-pay

## 2021-08-21 ENCOUNTER — Other Ambulatory Visit: Payer: Self-pay

## 2021-09-03 ENCOUNTER — Other Ambulatory Visit: Payer: Self-pay

## 2021-09-06 ENCOUNTER — Other Ambulatory Visit: Payer: Self-pay

## 2021-09-13 ENCOUNTER — Other Ambulatory Visit: Payer: Self-pay

## 2021-09-18 ENCOUNTER — Other Ambulatory Visit: Payer: Self-pay

## 2021-09-21 ENCOUNTER — Other Ambulatory Visit: Payer: Self-pay

## 2021-10-01 ENCOUNTER — Telehealth: Payer: Self-pay | Admitting: Pharmacist

## 2021-10-01 NOTE — Telephone Encounter (Signed)
10/01/2021 3:18:03 PM - Epi-Pen refill call to [facility] ?-- Tarry Kos - Monday, Oct 01, 2021 3:16 PM --Called in refill for Epi-Pen to [facility]. Spoke to [place], confirmation# [id number] ?

## 2021-10-09 ENCOUNTER — Other Ambulatory Visit: Payer: Self-pay

## 2021-10-10 ENCOUNTER — Other Ambulatory Visit: Payer: Self-pay

## 2021-10-10 MED ORDER — EPINEPHRINE 0.3 MG/0.3ML IJ SOAJ
INTRAMUSCULAR | 3 refills | Status: AC
  Filled 2021-10-10: qty 2, 28d supply, fill #0
  Filled 2021-11-27: qty 2, 30d supply, fill #0

## 2021-10-16 ENCOUNTER — Telehealth: Payer: Self-pay

## 2021-10-16 NOTE — Telephone Encounter (Signed)
Received 3 Symbicort Rx from [facility].  Spoke to patient and made him aware. He stated that he would come by this week to pickup Rx. Nothing further needed.

## 2021-10-26 ENCOUNTER — Other Ambulatory Visit: Payer: Self-pay

## 2021-10-30 ENCOUNTER — Other Ambulatory Visit: Payer: Self-pay

## 2021-11-15 ENCOUNTER — Other Ambulatory Visit: Payer: Self-pay

## 2021-11-15 ENCOUNTER — Telehealth: Payer: Self-pay | Admitting: Pulmonary Disease

## 2021-11-16 NOTE — Telephone Encounter (Signed)
Lm x2 for patient Will close encounter per office protocol. Letter mailed to address on file.    

## 2021-11-21 ENCOUNTER — Other Ambulatory Visit: Payer: Self-pay

## 2021-11-27 ENCOUNTER — Other Ambulatory Visit: Payer: Self-pay | Admitting: Pulmonary Disease

## 2021-11-27 ENCOUNTER — Other Ambulatory Visit: Payer: Self-pay

## 2021-11-27 DIAGNOSIS — J4550 Severe persistent asthma, uncomplicated: Secondary | ICD-10-CM

## 2021-11-27 MED ORDER — MONTELUKAST SODIUM 10 MG PO TABS
ORAL_TABLET | Freq: Every day | ORAL | 2 refills | Status: DC
  Filled 2021-11-27: qty 90, 90d supply, fill #0
  Filled 2022-10-09: qty 90, 90d supply, fill #1

## 2021-12-10 ENCOUNTER — Other Ambulatory Visit: Payer: Self-pay

## 2021-12-27 ENCOUNTER — Ambulatory Visit: Payer: Medicaid Other | Admitting: Pulmonary Disease

## 2022-02-06 ENCOUNTER — Telehealth: Payer: Self-pay | Admitting: *Deleted

## 2022-02-06 NOTE — Telephone Encounter (Signed)
-----   Message from Tyler Pita, MD sent at 02/05/2022 10:14 AM EDT ----- Send Rx to the [facility] health pharmacy for [place] 2 puffs twice a day #3 with 3 refills. ----- Message ----- From: Chauncey Reading Sent: 02/05/2022   9:32 AM EDT To: Tyler Pita, MD  We are currently ordering Symbicort for Shannon Knapp from [facility].  After December 31,2023, [facility] will no longer provide Symbicort for free.  We can still obtain BREO, Breztri, Trelegy and [employer].  Would you send a prescription to [facility] for the inhaler that you wish to substitute in place of the Symbicort?  Thank you,  Alm Bustard

## 2022-02-06 NOTE — Telephone Encounter (Signed)
I tried calling the pt and there was no answer and unable to leave him Will pend rx and try again later

## 2022-02-08 NOTE — Telephone Encounter (Signed)
ATC both mobile and work number on file. Received recording that call could not be completed at this time.  Call EC, [name] and received recording that call could not be completed.  Will try once more.

## 2022-02-08 NOTE — Telephone Encounter (Signed)
ATC both numbers on file and received recording that call could not be completed.  Letter has been mailed to address on file.  Will close encounter.

## 2022-02-16 IMAGING — CR DG CHEST 2V
1 series · 2 of 2 positions shown · non-contrast
Comparison: Chest x-ray dated August 29, 2020.

CLINICAL DATA: Chest pain, cough, and shortness of breath.

EXAM:
CHEST - 2 VIEW

[Series 1: dg chest 2 view · 0.14mm/px · 2 of 2 slices shown]
[im 1/2]
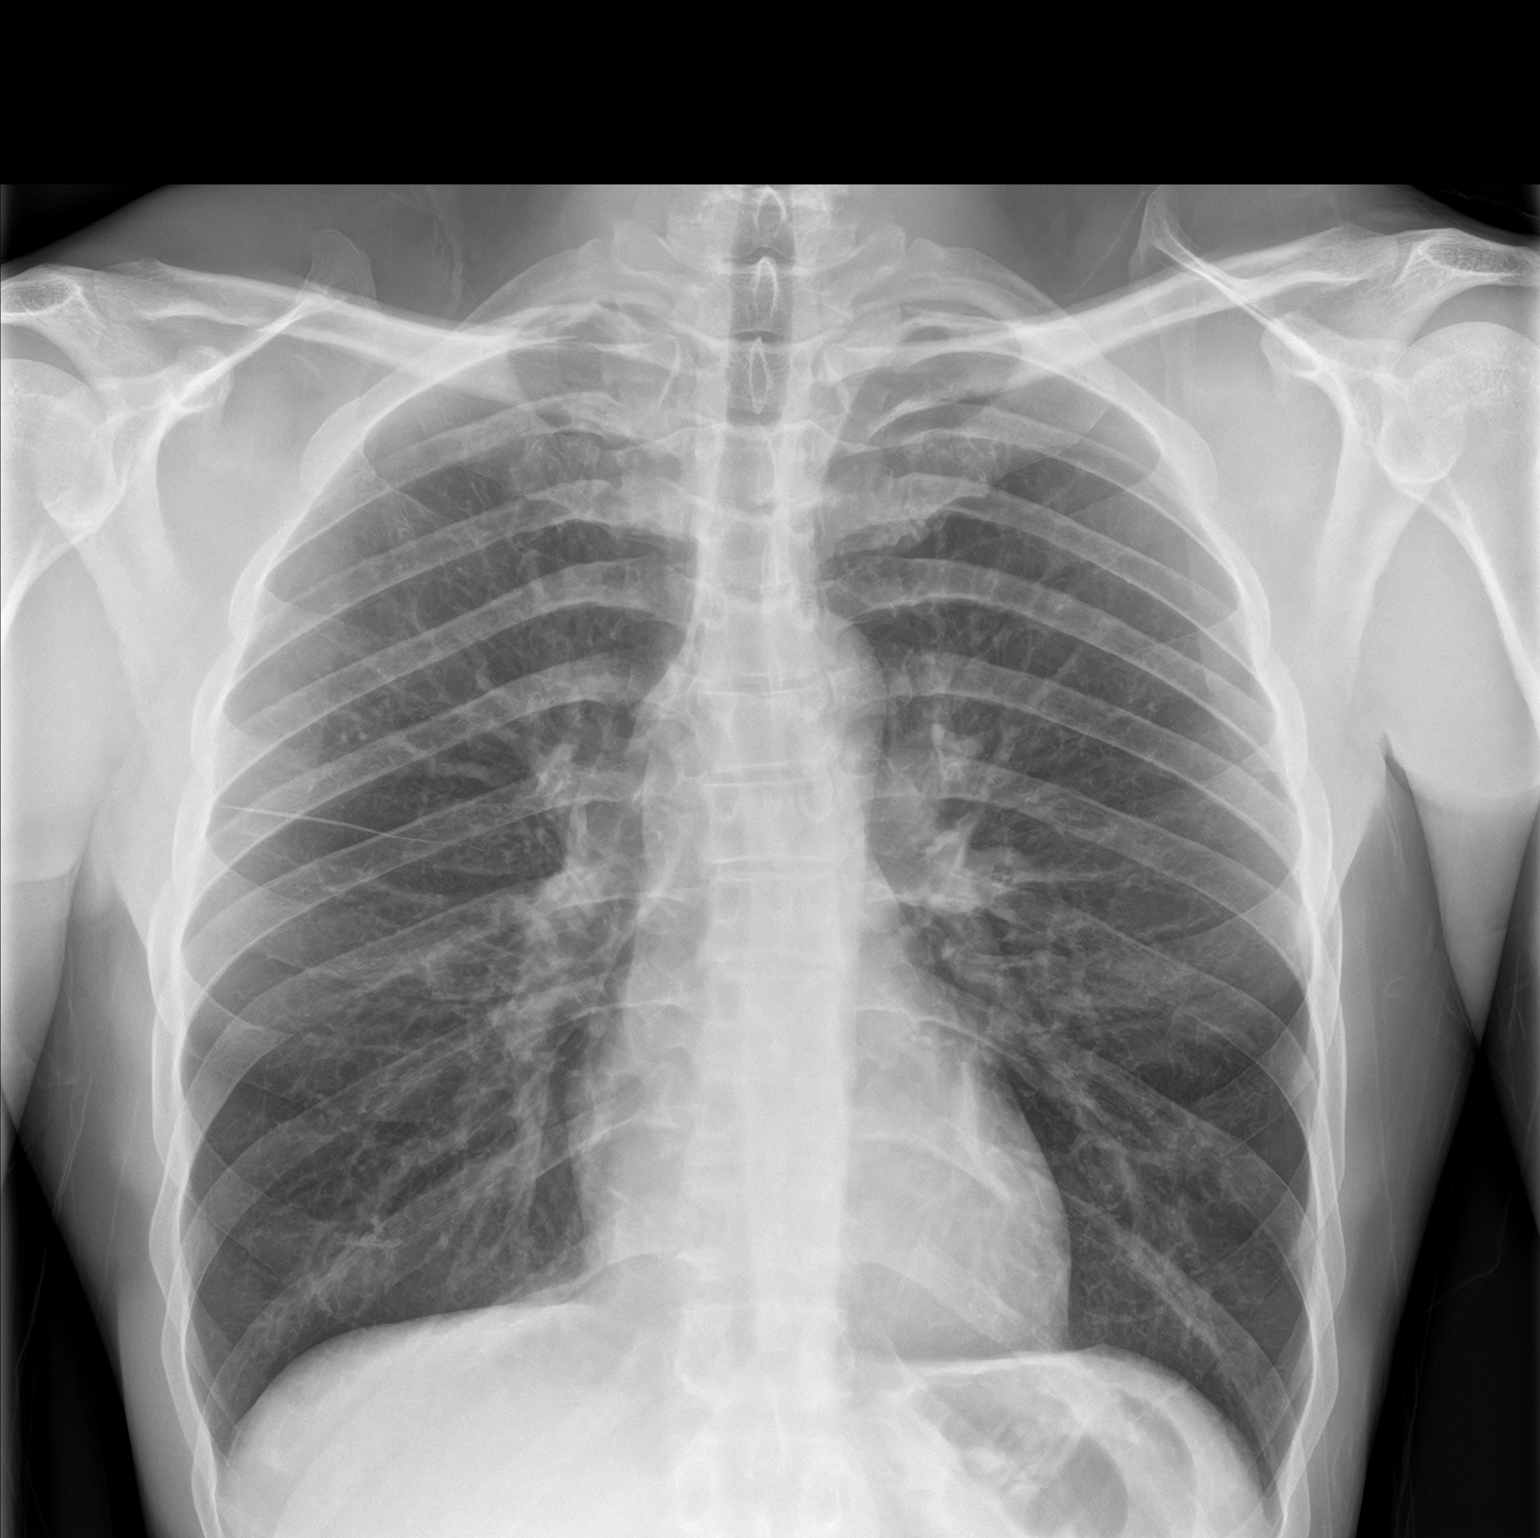
[im 2/2]
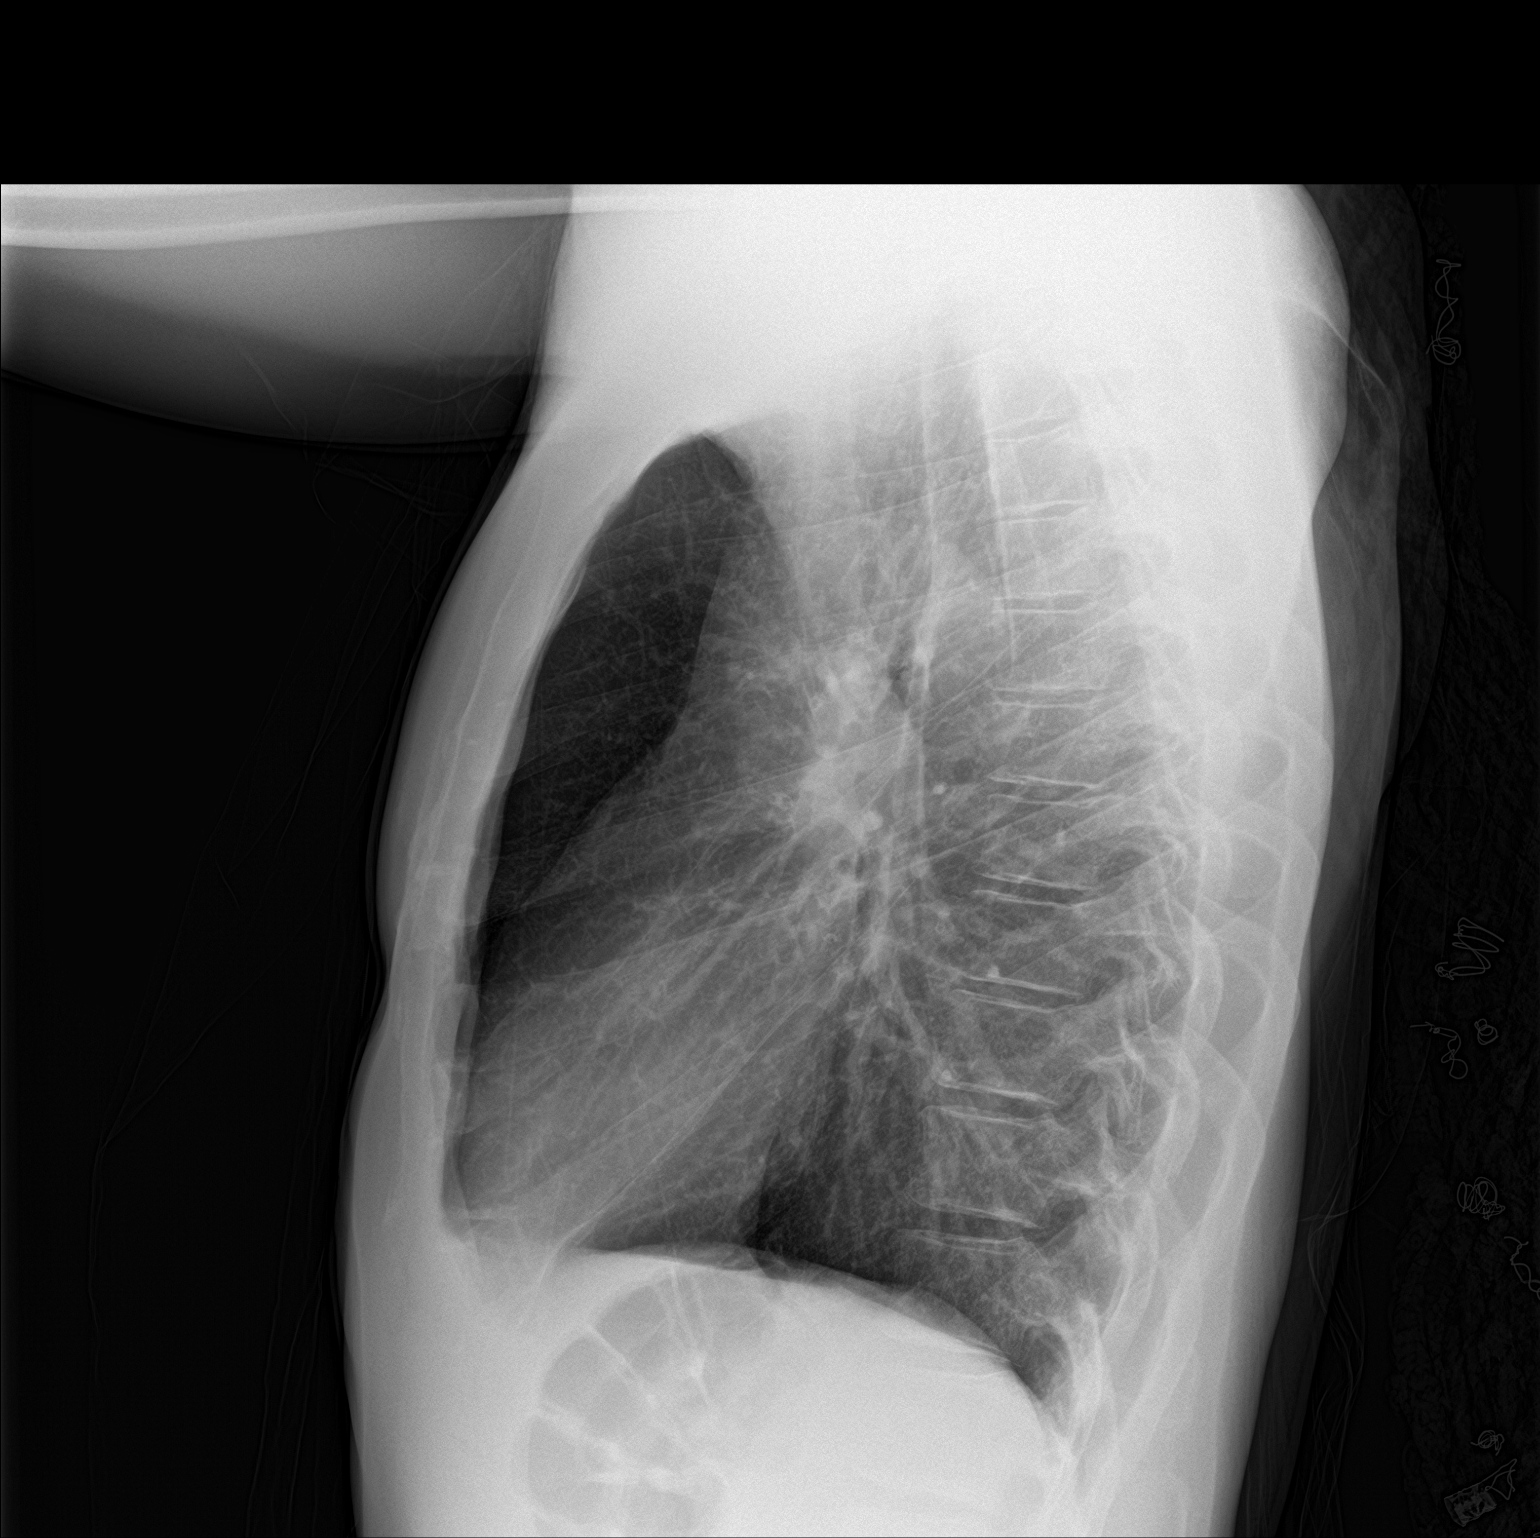

[2 of 2 positions shown; findings below may reference images not displayed]

FINDINGS: The heart size and mediastinal contours are within normal limits.
Normal pulmonary vascularity. Central peribronchial thickening. No
focal consolidation, pleural effusion, or pneumothorax. Trace
thickening along the right major fissure posteriorly. No acute
osseous abnormality.
IMPRESSION: 1.  No active cardiopulmonary disease.
2. Chronic bronchitic changes.

## 2022-03-11 ENCOUNTER — Other Ambulatory Visit: Payer: Self-pay

## 2022-05-03 ENCOUNTER — Other Ambulatory Visit: Payer: Self-pay

## 2022-06-21 ENCOUNTER — Emergency Department
Admission: EM | Admit: 2022-06-21 | Discharge: 2022-06-21 | Disposition: A | Discharge status: Home / Self Care | Payer: Medicaid Other | Attending: Emergency Medicine | Admitting: Emergency Medicine

## 2022-06-21 ENCOUNTER — Encounter: Payer: Self-pay | Admitting: Emergency Medicine

## 2022-06-21 ENCOUNTER — Emergency Department: Payer: Medicaid Other

## 2022-06-21 ENCOUNTER — Other Ambulatory Visit: Payer: Self-pay

## 2022-06-21 DIAGNOSIS — J101 Influenza due to other identified influenza virus with other respiratory manifestations: Secondary | ICD-10-CM | POA: Insufficient documentation

## 2022-06-21 DIAGNOSIS — J111 Influenza due to unidentified influenza virus with other respiratory manifestations: Secondary | ICD-10-CM

## 2022-06-21 DIAGNOSIS — Z20822 Contact with and (suspected) exposure to covid-19: Secondary | ICD-10-CM | POA: Insufficient documentation

## 2022-06-21 LAB — RESP PANEL BY RT-PCR (RSV, FLU A&B, COVID)  RVPGX2
Influenza A by PCR: POSITIVE — AB
Influenza B by PCR: NEGATIVE
Resp Syncytial Virus by PCR: NEGATIVE
SARS Coronavirus 2 by RT PCR: NEGATIVE

## 2022-06-21 MED ORDER — IBUPROFEN 600 MG PO TABS
600.0000 mg | ORAL_TABLET | Freq: Once | ORAL | Status: AC
  Administered 2022-06-21: 600 mg via ORAL
  Filled 2022-06-21: qty 1

## 2022-06-21 MED ORDER — KETOROLAC TROMETHAMINE 30 MG/ML IJ SOLN: 30.0000 mg | Freq: Once | INTRAMUSCULAR | Status: DC

## 2022-06-21 MED ORDER — BENZONATATE 100 MG PO CAPS
100.0000 mg | ORAL_CAPSULE | Freq: Three times a day (TID) | ORAL | 0 refills | Status: DC | PRN
  Filled 2022-06-21: qty 30, 10d supply, fill #0

## 2022-06-21 MED ORDER — ACETAMINOPHEN 325 MG PO TABS
650.0000 mg | ORAL_TABLET | Freq: Once | ORAL | Status: AC
  Administered 2022-06-21: 650 mg via ORAL
  Filled 2022-06-21: qty 2

## 2022-06-21 NOTE — ED Triage Notes (Signed)
Patient to ED with cough and body aches since yesterday. NAD noted.

## 2022-06-21 NOTE — ED Provider Notes (Addendum)
[facility] Provider Note    Event Date/Time   First MD Initiated Contact with Patient 06/21/22 1730     (approximate)   History   Cough   HPI  Shannon Knapp is a 37 y.o. male otherwise healthy not on any daily medications who comes in with concerns for cough and bodyaches since yesterday.  Reports his son is also been sick who is being seen here today.  He denies any nausea.  He denies any abdominal pain.  He does report some coughing.  Denies any pain with urination.  Patient has not taken anything except for this morning around 3 AM he took some Robitussin.   Physical Exam   Triage Vital Signs: ED Triage Vitals  Enc Vitals Group     BP 06/21/22 1719 (!) 146/81     Pulse Rate 06/21/22 1719 (!) 128     Resp 06/21/22 1719 18     Temp 06/21/22 1719 (!) 102 F (38.9 C)     Temp Source 06/21/22 1719 Oral     SpO2 06/21/22 1719 96 %     Weight --      Height --      Head Circumference --      Peak Flow --      Pain Score 06/21/22 1720 8     Pain Loc --      Pain Edu? --      Excl. in [place]? --     Most recent vital signs: Vitals:   06/21/22 1719  BP: (!) 146/81  Pulse: (!) 128  Resp: 18  Temp: (!) 102 F (38.9 C)  SpO2: 96%     General: Awake, no distress.  CV:  Good peripheral perfusion.  Clear lungs Resp:  Normal effort.  Abd:  No distention.  Soft nontender Other:     ED Results / Procedures / Treatments   Labs (all labs ordered are listed, but only abnormal results are displayed) Labs Reviewed  RESP PANEL BY RT-PCR (RSV, FLU A&B, COVID)  RVPGX2 - Abnormal; Notable for the following components:      Result Value   Influenza A by PCR POSITIVE (*)    All other components within normal limits   RADIOLOGY I have reviewed the xray personally and interpreted and no evidence of any pneumonia   PROCEDURES:  Critical Care performed: No  Procedures   MEDICATIONS ORDERED IN ED: Medications  ibuprofen (ADVIL) tablet 600  mg (has no administration in time range)  acetaminophen (TYLENOL) tablet 650 mg (650 mg Oral Given 06/21/22 1721)     IMPRESSION / MDM / [place] / ED COURSE  I reviewed the triage vital signs and the nursing notes.   Patient's presentation is most consistent with acute, uncomplicated illness.   Differential is COVID, flu, pneumonia.  Chest x-ray ordered evaluate for any pneumonia.  Flu test is positive for him and his son.  Patient was febrile tachycardic patient given Tylenol offered Toradol for body aches patient declined will give ibuprofen instead.  Chest x-ray without any evidence of pneumonia.  Will get a recheck vitals given patient is already got Tylenol.  We discussed Tamiflu and he also declined Tamiflu.  We discussed symptomatic treatment with Tylenol, ibuprofen, Tessalon Perles.  He expressed understanding and felt comfortable with discharge home  Has come down to 98.  His temperature came down to 100.  Patient just got the ibuprofen to which should help with the rest of  his temperature.  No signs for severe dehydration given resolution of tachycardia patient has no nausea vomiting and can tolerate p.o.  Follow up outpt for recheck bp.  FINAL CLINICAL IMPRESSION(S) / ED DIAGNOSES   Final diagnoses:  Influenza     Rx / DC Orders   ED Discharge Orders          Ordered    benzonatate (TESSALON PERLES) 100 MG capsule  3 times daily PRN        06/21/22 1842             Note:  This document was prepared using Dragon voice recognition software and may include unintentional dictation errors.   Vanessa [place], MD 06/21/22 1859    Vanessa [place], MD 06/21/22 [phone]

## 2022-06-21 NOTE — Discharge Instructions (Addendum)
Take Tylenol 1 g every 8 hours and ibuprofen 600 every 6-8 hours with food to help with your pain and your body aches.  Return to the ER if you develop worsening symptoms or any other concerns.   follow up outpatient for recheck BP

## 2022-06-23 ENCOUNTER — Other Ambulatory Visit: Payer: Self-pay

## 2022-07-08 ENCOUNTER — Other Ambulatory Visit: Payer: Self-pay

## 2022-07-11 ENCOUNTER — Emergency Department: Payer: Self-pay

## 2022-07-11 ENCOUNTER — Other Ambulatory Visit: Payer: Self-pay

## 2022-07-11 ENCOUNTER — Emergency Department
Admission: EM | Admit: 2022-07-11 | Discharge: 2022-07-11 | Disposition: A | Discharge status: Home / Self Care | Payer: Self-pay | Attending: Emergency Medicine | Admitting: Emergency Medicine

## 2022-07-11 DIAGNOSIS — J45901 Unspecified asthma with (acute) exacerbation: Secondary | ICD-10-CM | POA: Insufficient documentation

## 2022-07-11 DIAGNOSIS — Z20822 Contact with and (suspected) exposure to covid-19: Secondary | ICD-10-CM | POA: Insufficient documentation

## 2022-07-11 DIAGNOSIS — J101 Influenza due to other identified influenza virus with other respiratory manifestations: Secondary | ICD-10-CM | POA: Insufficient documentation

## 2022-07-11 DIAGNOSIS — Z8616 Personal history of COVID-19: Secondary | ICD-10-CM | POA: Insufficient documentation

## 2022-07-11 LAB — RESP PANEL BY RT-PCR (RSV, FLU A&B, COVID)  RVPGX2
Influenza A by PCR: NEGATIVE
Influenza B by PCR: POSITIVE — AB
Resp Syncytial Virus by PCR: NEGATIVE
SARS Coronavirus 2 by RT PCR: NEGATIVE

## 2022-07-11 MED ORDER — IPRATROPIUM-ALBUTEROL 0.5-2.5 (3) MG/3ML IN SOLN
3.0000 mL | Freq: Once | RESPIRATORY_TRACT | Status: AC
  Administered 2022-07-11: 3 mL via RESPIRATORY_TRACT
  Filled 2022-07-11: qty 3

## 2022-07-11 MED ORDER — IPRATROPIUM-ALBUTEROL 0.5-2.5 (3) MG/3ML IN SOLN
3.0000 mL | Freq: Once | RESPIRATORY_TRACT | Status: AC
  Administered 2022-07-11: 3 mL via RESPIRATORY_TRACT

## 2022-07-11 MED ORDER — BUDESONIDE-FORMOTEROL FUMARATE 160-4.5 MCG/ACT IN AERO
2.0000 | INHALATION_SPRAY | Freq: Two times a day (BID) | RESPIRATORY_TRACT | 12 refills | Status: DC
  Filled 2022-07-11: qty 10.2, 30d supply, fill #0
  Filled 2022-08-19: qty 10.2, 30d supply, fill #1
  Filled 2022-10-09: qty 10.2, 30d supply, fill #2

## 2022-07-11 MED ORDER — ALBUTEROL SULFATE HFA 108 (90 BASE) MCG/ACT IN AERS
2.0000 | INHALATION_SPRAY | Freq: Four times a day (QID) | RESPIRATORY_TRACT | 2 refills | Status: DC | PRN
  Filled 2022-07-11: qty 8.5, 25d supply, fill #0
  Filled 2022-08-19: qty 8.5, 25d supply, fill #1
  Filled 2022-10-09: qty 6.7, 25d supply, fill #2

## 2022-07-11 MED ORDER — ACETAMINOPHEN 325 MG PO TABS
650.0000 mg | ORAL_TABLET | Freq: Once | ORAL | Status: AC
  Administered 2022-07-11: 650 mg via ORAL
  Filled 2022-07-11: qty 2

## 2022-07-11 MED ORDER — ACETAMINOPHEN 500 MG PO TABS: 1000.0000 mg | ORAL_TABLET | Freq: Once | ORAL | Status: DC

## 2022-07-11 MED ORDER — ALBUTEROL SULFATE (2.5 MG/3ML) 0.083% IN NEBU
2.5000 mg | INHALATION_SOLUTION | RESPIRATORY_TRACT | 2 refills | Status: DC | PRN
  Filled 2022-07-11: qty 75, 10d supply, fill #0
  Filled 2022-08-19: qty 75, 10d supply, fill #1
  Filled 2022-10-09: qty 75, 10d supply, fill #2

## 2022-07-11 MED ORDER — IBUPROFEN 400 MG PO TABS
400.0000 mg | ORAL_TABLET | Freq: Once | ORAL | Status: AC
  Administered 2022-07-11: 400 mg via ORAL
  Filled 2022-07-11: qty 1

## 2022-07-11 MED ORDER — PREDNISONE 50 MG PO TABS
50.0000 mg | ORAL_TABLET | Freq: Every day | ORAL | 0 refills | Status: DC
  Filled 2022-07-11: qty 5, 5d supply, fill #0

## 2022-07-11 MED ORDER — PREDNISONE 20 MG PO TABS
60.0000 mg | ORAL_TABLET | Freq: Once | ORAL | Status: AC
  Administered 2022-07-11: 60 mg via ORAL
  Filled 2022-07-11: qty 3

## 2022-07-11 MED ORDER — COMPRESSOR/NEBULIZER MISC
1.0000 | Freq: Every day | 0 refills | Status: AC
  Filled 2022-07-11: qty 1, 1d supply, fill #0

## 2022-07-11 MED ORDER — IPRATROPIUM-ALBUTEROL 0.5-2.5 (3) MG/3ML IN SOLN
3.0000 mL | Freq: Once | RESPIRATORY_TRACT | Status: AC
  Administered 2022-07-11: 3 mL via RESPIRATORY_TRACT
  Filled 2022-07-11: qty 6

## 2022-07-11 NOTE — ED Provider Notes (Signed)
[facility] Provider Note    Event Date/Time   First MD Initiated Contact with Patient 07/11/22 1053     (approximate)   History   Cough   HPI  Shannon Knapp is a 37 y.o. male  with pmh asthma, allergic bronchopulmonary aspergillosis, who presents with cough and shortness of breath.  Patient tells me that about 3 weeks ago he was diagnosed with influenza.  He initially had a cough that seem to maybe get a little bit better but not completely.  However over the last week and a half he has felt worse and felt more short of breath.  When he coughs he has pain in the bilateral ribs in front of his chest.  No exertional or pleuritic chest pain.  He is been coughing up green sputum, no hemoptysis.  Denies fevers at home.  Still tolerating p.o.  Did not receive Tamiflu and was diagnosed with influenza.  Came to ED on 2/2 and had positive influenza test.The patient denies hx of prior DVT/PE, unilateral leg pain/swelling, hormone use, recent surgery, hx of cancer, prolonged immobilization, or hemoptysis.    Patient does have history of asthma requiring admission he says in his early 42s.  Was previously seeing a pulmonologist but has been very well-controlled in the last several years so does not see a doctor.  Does not use albuterol regularly not been on any recent steroids.  Reviewed last pulmonology note which from about a year ago.  Patient had previously been on Symbicort Dupixent and prednisone 10 mg daily with as needed albuterol.  He has not been taking any of this except for the as needed albuterol just of late.     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   ABPA (allergic bronchopulmonary aspergillosis) ([place]) 01/07/2020   Community acquired pneumonia of left lower lobe of lung 10/25/2019   Medication management 04/30/2019   Healthcare maintenance 11/26/2018   Severe persistent asthma 05/28/2018   Acute febrile illness 04/10/2018   Upper airway cough syndrome    Hypoxemia    Syncope    Allergic rhinitis    Cough    Hemoptysis    Protein-calorie malnutrition, severe 10/20/2017   Acute respiratory failure with hypoxemia ([place]) 10/20/2017   Acute respiratory distress    Asthma exacerbation    Status asthmaticus 10/18/2017   Severe persistent acute asthmatic bronchitis 10/17/2017   Elevated blood pressure reading without diagnosis of hypertension 10/17/2017   Respiratory distress 10/17/2017     Physical Exam  Triage Vital Signs: ED Triage Vitals  Enc Vitals Group     BP 07/11/22 1018 (!) 130/96     Pulse Rate 07/11/22 1018 (!) 101     Resp 07/11/22 1018 18     Temp 07/11/22 1018 100.3 F (37.9 C)     Temp Source 07/11/22 1018 Oral     SpO2 07/11/22 1018 98 %  Weight 07/11/22 1019 166 lb 0.1 oz (75.3 kg)     Height 07/11/22 1019 5' 11"$  (1.803 m)     Head Circumference --      Peak Flow --      Pain Score 07/11/22 1019 9     Pain Loc --      Pain Edu? --      Excl. in [place]? --     Most recent vital signs: Vitals:   07/11/22 1018 07/11/22 1251  BP: (!) 130/96   Pulse: (!) 101 94  Resp: 18   Temp: 100.3 F (37.9 C)   SpO2: 98%      General: Awake, no distress.  CV:  Good peripheral perfusion.  No peripheral edema, no calf asymmetry Resp:  Normal effort.  No increased work of breathing, no accessory muscle use, there is expiratory  wheezing throughout but good air movement abd:  No distention.  Neuro:             Awake, Alert, Oriented x 3  Other:     ED Results / Procedures / Treatments  Labs (all labs ordered are listed, but only abnormal results are displayed) Labs Reviewed  RESP PANEL BY RT-PCR (RSV, FLU A&B, COVID)  RVPGX2 - Abnormal; Notable for the following components:      Result Value   Influenza B by PCR POSITIVE (*)    All other components within normal limits     EKG  EKG interpretation performed by myself: NSR, nml axis, nml intervals, no acute ischemic changes    RADIOLOGY I reviewed and interpreted the chest x-ray which is negative for lobar infiltrate   PROCEDURES:  Critical Care performed: No  .1-3 Lead EKG Interpretation  Performed by: Rada Hay, MD Authorized by: Rada Hay, MD     Interpretation: normal     ECG rate assessment: normal     Rhythm: sinus rhythm     Ectopy: none     Conduction: normal     The patient is on the cardiac monitor to evaluate for evidence of arrhythmia and/or significant heart rate changes.   MEDICATIONS ORDERED IN ED: Medications  acetaminophen (TYLENOL) tablet 650 mg (650 mg Oral Given 07/11/22 1025)  predniSONE (DELTASONE) tablet 60 mg (60 mg Oral Given 07/11/22 1131)  ipratropium-albuterol (DUONEB) 0.5-2.5 (3) MG/3ML nebulizer solution 3 mL (3 mLs Nebulization Given 07/11/22 1131)  ipratropium-albuterol (DUONEB) 0.5-2.5 (3) MG/3ML nebulizer solution 3 mL (3 mLs Nebulization Given 07/11/22 1217)  ipratropium-albuterol (DUONEB) 0.5-2.5 (3) MG/3ML nebulizer solution 3 mL (3 mLs Nebulization Given 07/11/22 1217)  ibuprofen (ADVIL) tablet 400 mg (400 mg Oral Given 07/11/22 1258)     IMPRESSION / MDM / ASSESSMENT AND PLAN / ED COURSE  I reviewed the triage vital signs and the nursing notes.                              Patient's presentation is most consistent with acute complicated illness / injury requiring diagnostic  workup.  Differential diagnosis includes, but is not limited to, post influenza bacterial pneumonia, asthma exacerbation due to viral illness, post viral bronchitis, pulmonary embolism  The patient is a 37 year old male with history of asthma who presents with cough shortness of breath in setting of recent influenza illness.  Patient was diagnosed with flu 20 days ago.  At that time he had a cough and says that he never fully recovered and is now worsening  over the last week and a half.  Cough productive of green sputum intermittently without hemoptysis.  He is feeling short of breath with exertion and is having pain in the ribs with coughing but no pleuritic or exertional chest pain.  He has not had fevers at home.  Has no PE risk factors.  On arrival temp is 100.3, mildly tachycardic but saturating well on room air.  When I am in the room his heart rate is normalized prior to intervention and he is saturating well on room air.  He has no increased work of breathing is speaking in full sentences but does have expiratory wheezing.  No signs of DVT.  Suspicious for possible post influenza bacterial pneumonia versus exacerbation of asthma in the setting of post viral illness.  Will obtain 2 view chest x-ray.  Will give DuoNeb prednisone.  Patient will likely need antibiotics regardless.  Patient's viral test has come back positive for influenza B.  3 weeks ago he was positive for influenza A which likely explains why he is feeling worse again.  There is no lobar infiltrate.  Radiology does comment on pulmonary nodules in bilateral upper lobes and left lower lobe with recommendation for CT.  I informed patient of this finding.  See that these were not commented on on prior x-ray from earlier this month so question whether this is infectious.  I did discuss this finding with the patient and recommended outpatient CT of the chest.    On reassessment patient says he feels improved but is still wheezing.  Will  give additional DuoNeb's.  Reassessed patient again.  Does have persistent expiratory wheeze but is moving good air.  Saturating 95% on room air.  Does feel improved still having pain in the chest with coughing.  I checked an ambulatory pulse ox and patient was 94% and above did not desaturate with ambulation.  Given he is feeling improved is not hypoxic and has no increased work of breathing I think he can be discharged.  Will discharge with 5 days of 50 mg of prednisone.  I will prescribe his Symbicort as well as a nebulizer machine which she is run out of.  Will recommend use of albuterol every 4 hours.  Discussed importance of pulmonary follow-up given his abnormal x-ray.  Discussed return precautions.     FINAL CLINICAL IMPRESSION(S) / ED DIAGNOSES   Final diagnoses:  Influenza B  Exacerbation of asthma, unspecified asthma severity, unspecified whether persistent     Rx / DC Orders   ED Discharge Orders          Ordered    predniSONE (DELTASONE) 50 MG tablet        07/11/22 1259    SYMBICORT 160-4.5 MCG/ACT inhaler  2 times daily        07/11/22 1259    Nebulizers (COMPRESSOR/NEBULIZER) MISC  Daily        07/11/22 1259    albuterol (PROVENTIL) (2.5 MG/3ML) 0.083% nebulizer solution  Every 4 hours PRN        07/11/22 1259    albuterol (VENTOLIN HFA) 108 (90 Base) MCG/ACT inhaler  Every 6 hours PRN        07/11/22 1259             Note:  This document was prepared using Dragon voice recognition software and may include unintentional dictation errors.   Rada Hay, MD 07/11/22 [phone]

## 2022-07-11 NOTE — Discharge Instructions (Addendum)
You tested positive for flu B.  You are also having exacerbation of asthma.  Please take the prednisone once daily for the next 5 days.  Please start using the Symbicort again I have sent this prescription to your pharmacy.  I have also prescribed you a nebulizer machine, albuterol for nebulizer and albuterol inhaler.  In addition to the Symbicort and prednisone please use your albuterol 2 to 4 puffs every 4 hours.  If you are feeling more short of breath despite all of this treatment please return to the emergency department.  On your x-ray today we did see nodules on your lungs.  You will need to have a CT of your chest done as an outpatient to follow this.

## 2022-07-11 NOTE — ED Triage Notes (Signed)
Pt here with a cough. Pt states 3 weeks ago he had the flu. Pt states the coughing and wheezing then shortly returned. Pt has tried OTC medications and his inhaler with no relief.

## 2022-07-12 ENCOUNTER — Other Ambulatory Visit: Payer: Self-pay

## 2022-07-15 ENCOUNTER — Other Ambulatory Visit: Payer: Self-pay

## 2022-07-18 ENCOUNTER — Other Ambulatory Visit: Payer: Self-pay

## 2022-07-22 ENCOUNTER — Other Ambulatory Visit: Payer: Self-pay

## 2022-07-26 ENCOUNTER — Other Ambulatory Visit: Payer: Self-pay

## 2022-08-01 ENCOUNTER — Other Ambulatory Visit: Payer: Self-pay

## 2022-08-02 ENCOUNTER — Other Ambulatory Visit: Payer: Self-pay

## 2022-08-19 ENCOUNTER — Other Ambulatory Visit: Payer: Self-pay

## 2022-08-20 ENCOUNTER — Other Ambulatory Visit: Payer: Self-pay

## 2022-10-09 ENCOUNTER — Other Ambulatory Visit: Payer: Self-pay

## 2022-10-12 ENCOUNTER — Other Ambulatory Visit: Payer: Self-pay

## 2022-10-12 ENCOUNTER — Emergency Department
Admission: EM | Admit: 2022-10-12 | Discharge: 2022-10-12 | Disposition: A | Discharge status: Home / Self Care | Payer: 59 | Attending: Emergency Medicine | Admitting: Emergency Medicine

## 2022-10-12 DIAGNOSIS — Z1152 Encounter for screening for COVID-19: Secondary | ICD-10-CM | POA: Diagnosis not present

## 2022-10-12 DIAGNOSIS — H6690 Otitis media, unspecified, unspecified ear: Secondary | ICD-10-CM

## 2022-10-12 DIAGNOSIS — J45901 Unspecified asthma with (acute) exacerbation: Secondary | ICD-10-CM

## 2022-10-12 DIAGNOSIS — H6691 Otitis media, unspecified, right ear: Secondary | ICD-10-CM | POA: Diagnosis not present

## 2022-10-12 DIAGNOSIS — J4521 Mild intermittent asthma with (acute) exacerbation: Secondary | ICD-10-CM | POA: Diagnosis not present

## 2022-10-12 DIAGNOSIS — H9201 Otalgia, right ear: Secondary | ICD-10-CM | POA: Diagnosis not present

## 2022-10-12 LAB — SARS CORONAVIRUS 2 BY RT PCR: SARS Coronavirus 2 by RT PCR: NEGATIVE

## 2022-10-12 MED ORDER — PREDNISONE 50 MG PO TABS: 50.0000 mg | ORAL_TABLET | Freq: Every day | ORAL | 0 refills | Status: DC

## 2022-10-12 MED ORDER — AZITHROMYCIN 250 MG PO TABS: ORAL_TABLET | ORAL | 0 refills | Status: AC

## 2022-10-12 MED ORDER — IPRATROPIUM-ALBUTEROL 0.5-2.5 (3) MG/3ML IN SOLN
3.0000 mL | Freq: Once | RESPIRATORY_TRACT | Status: AC
  Administered 2022-10-12: 3 mL via RESPIRATORY_TRACT
  Filled 2022-10-12: qty 3

## 2022-10-12 MED ORDER — AZITHROMYCIN 500 MG PO TABS
500.0000 mg | ORAL_TABLET | Freq: Once | ORAL | Status: AC
  Administered 2022-10-12: 500 mg via ORAL
  Filled 2022-10-12: qty 1

## 2022-10-12 NOTE — ED Provider Notes (Signed)
[facility] Provider Note    Event Date/Time   First MD Initiated Contact with Patient 10/12/22 1316     (approximate)   History   Otalgia   HPI  Shannon Knapp is a 37 y.o. male who presents with complaints of right ear pain and pressure.  Patient reports he is been trying to rub the area and may have caused some bleeding.  Symptoms been ongoing for several days.  Left ear is normal     Physical Exam   Triage Vital Signs: ED Triage Vitals  Enc Vitals Group     BP 10/12/22 1254 (!) 142/94     Pulse Rate 10/12/22 1254 99     Resp 10/12/22 1254 16     Temp 10/12/22 1254 98.9 F (37.2 C)     Temp Source 10/12/22 1254 Oral     SpO2 10/12/22 1254 95 %     Weight 10/12/22 1255 77.6 kg (171 lb)     Height 10/12/22 1255 1.803 m (5\' 11" )     Head Circumference --      Peak Flow --      Pain Score 10/12/22 1255 7     Pain Loc --      Pain Edu? --      Excl. in GC? --     Most recent vital signs: Vitals:   10/12/22 1254  BP: (!) 142/94  Pulse: 99  Resp: 16  Temp: 98.9 F (37.2 C)  SpO2: 95%     General: Awake, no distress.  CV:  Good peripheral perfusion.  Resp:  Normal effort.  Abd:  No distention.  Other:  Right TM is bulging and likely fluid-filled, no evidence of rupture.  Small area of irritation with dried blood on the external acoustic meatus   ED Results / Procedures / Treatments   Labs (all labs ordered are listed, but only abnormal results are displayed) Labs Reviewed  SARS CORONAVIRUS 2 BY RT PCR     EKG     RADIOLOGY     PROCEDURES:  Critical Care performed:   Procedures   MEDICATIONS ORDERED IN ED: Medications  ipratropium-albuterol (DUONEB) 0.5-2.5 (3) MG/3ML nebulizer solution 3 mL (3 mLs Nebulization Given 10/12/22 1335)  azithromycin (ZITHROMAX) tablet 500 mg (500 mg Oral Given 10/12/22 1334)     IMPRESSION / MDM / ASSESSMENT AND PLAN / ED COURSE  I reviewed the triage vital signs and the  nursing notes. Patient's presentation is most consistent with acute, uncomplicated illness.  Suspicious for acute otitis media, patient has penicillin allergy, will treat with Z-Pak.  Patient also wheezing significantly, does have a history of asthma, treated with DuoNeb here, will provide prednisone prescription as needed        FINAL CLINICAL IMPRESSION(S) / ED DIAGNOSES   Final diagnoses:  Acute otitis media, unspecified otitis media type  Mild asthma with exacerbation, unspecified whether persistent     Rx / DC Orders   ED Discharge Orders          Ordered    azithromycin (ZITHROMAX Z-PAK) 250 MG tablet        10/12/22 1333    predniSONE (DELTASONE) 50 MG tablet  Daily with breakfast        10/12/22 1333             Note:  This document was prepared using Dragon voice recognition software and may include unintentional dictation errors.   Jene Every, MD 10/12/22 [phone]

## 2022-10-12 NOTE — ED Triage Notes (Signed)
Pt to ED for ear pain, has been to both ears back and forth. States R ear was bleeding last night. States is coughing up green and yellow mucous and has been using inhalers (albuterol singulair and montelukast).

## 2022-10-30 ENCOUNTER — Telehealth: Payer: Self-pay | Admitting: Pulmonary Disease

## 2022-10-30 ENCOUNTER — Other Ambulatory Visit: Payer: Self-pay

## 2022-10-30 MED ORDER — PREDNISONE 10 MG PO TABS
ORAL_TABLET | ORAL | 0 refills | Status: DC
  Filled 2022-10-30: qty 19, 16d supply, fill #0

## 2022-10-30 MED ORDER — BUDESONIDE-FORMOTEROL FUMARATE 160-4.5 MCG/ACT IN AERO
2.0000 | INHALATION_SPRAY | Freq: Two times a day (BID) | RESPIRATORY_TRACT | 0 refills | Status: DC
  Filled 2022-10-30: qty 10.3, 31d supply, fill #0
  Filled 2022-10-30: qty 1, fill #0

## 2022-10-30 NOTE — Telephone Encounter (Signed)
Patient returned call, he states that he is not on Dupixent, he weaned himself off of his medications because he was doing fine, He then went into the hospital and was put on antibiotic and prednisone.  He was doing better until last night.  He thinks the hot weather and his job where he is going in and out of the hot and cold air started his symptoms.  I provided recommendations per Dr. [name].  I verified his pharmacy and meds sent.  I verified his f/u with Micheline Maze NP on 6/28.  He verbalized understanding.  Nothing further needed.

## 2022-10-30 NOTE — Telephone Encounter (Signed)
I tried to contact the patient. He did not answer and his voicemail is not set up. I will try again later.

## 2022-10-30 NOTE — Telephone Encounter (Signed)
Is he still using his Dupixent?  Okay to fill Breyna 160/4.5, 2 puffs twice a day.  Okay to refill albuterol.  Give enough to last until he comes to see [name].  IT IS VERY IMPORTANT THAT HE NOT RUN OUT OF HIS MEDICATIONS DUE TO HIS SEVERE ASTHMA.  He needs to be more consistent with follow-up at the clinic.  Okay for prednisone 10 mg, 2 tablets daily for 3 days then remain on 10 mg (1 tablet) until seen by Florentina Addison.

## 2022-10-30 NOTE — Telephone Encounter (Signed)
I spoke with the patient. He is needing a refill on the Proventil and Breyna (in place of Symbicort) inhalers.   He is also asking for a refill on the Prednisone the ED gave him. ED on 10/12/2022 for Asthma exacerbation. Still wheezing. SOB when he coughs Cough with yellow sputum, worse at night.  No F/C/S  He has finished the antibiotics.  Scheduled to see Rhunette Croft on 6/28.   Dr. [name], please advise if the Prednisone is ok to be refilled?

## 2022-10-30 NOTE — Telephone Encounter (Signed)
Pt is out of his meds. Is asking for rx to get him thru 6/28 when he is scheduled with [facility]

## 2022-10-30 NOTE — Telephone Encounter (Signed)
Pt called in bc his medicine Jerral Ralph is unavail at his pharmacy pls advise

## 2022-10-31 ENCOUNTER — Telehealth: Payer: Self-pay | Admitting: Pulmonary Disease

## 2022-10-31 ENCOUNTER — Other Ambulatory Visit: Payer: Self-pay | Admitting: Pulmonary Disease

## 2022-10-31 ENCOUNTER — Other Ambulatory Visit: Payer: Self-pay

## 2022-10-31 MED ORDER — ALBUTEROL SULFATE HFA 108 (90 BASE) MCG/ACT IN AERS
2.0000 | INHALATION_SPRAY | Freq: Four times a day (QID) | RESPIRATORY_TRACT | 5 refills | Status: DC | PRN
  Filled 2022-10-31: qty 6.7, 25d supply, fill #0
  Filled 2023-01-01: qty 8.5, 30d supply, fill #1
  Filled 2023-02-13: qty 6.7, 25d supply, fill #2
  Filled 2023-02-13: qty 8.5, 30d supply, fill #2
  Filled 2023-03-24: qty 6.7, 25d supply, fill #3

## 2022-10-31 MED FILL — Albuterol Sulfate Soln Nebu 0.083% (2.5 MG/3ML): RESPIRATORY_TRACT | 5 days supply | Qty: 90 | Fill #0 | Status: AC

## 2022-10-31 MED FILL — Albuterol Sulfate Soln Nebu 0.083% (2.5 MG/3ML): RESPIRATORY_TRACT | 5 days supply | Qty: 75 | Fill #0 | Status: CN

## 2022-10-31 NOTE — Telephone Encounter (Signed)
albuterol (PROAIR HFA)   Pt went to pick up script and this one was not included. States he was told we would send in an RX. PT IS @ [phone]   Pharm is [facility] in [place] ([employer])   Has only one stick. Anxious about this not being called in right away.

## 2022-10-31 NOTE — Telephone Encounter (Signed)
Prescription for ProAir was sent to the patient's pharmacy. I have notified the patient.  Nothing further needed.

## 2022-10-31 NOTE — Telephone Encounter (Signed)
Patient had Proair called in, but he was needing the albuterol solution to go in his nebulizer machine. Patient is REALLY needing this script called in.

## 2022-10-31 NOTE — Telephone Encounter (Signed)
I called and spoke with the pt  He denies any questions about breyyna  He states needing refill on albuterol nebs  I have sent this to pharm  Nothing further needed

## 2022-11-04 ENCOUNTER — Other Ambulatory Visit: Payer: Self-pay

## 2022-11-15 ENCOUNTER — Ambulatory Visit (INDEPENDENT_AMBULATORY_CARE_PROVIDER_SITE_OTHER): Payer: Self-pay | Admitting: Nurse Practitioner

## 2022-11-15 ENCOUNTER — Telehealth: Payer: Self-pay

## 2022-11-15 ENCOUNTER — Encounter: Payer: Self-pay | Admitting: Nurse Practitioner

## 2022-11-15 ENCOUNTER — Other Ambulatory Visit: Payer: Self-pay

## 2022-11-15 VITALS — BP 120/70 | HR 93 | Temp 98.1°F | Ht 71.0 in | Wt 161.8 lb

## 2022-11-15 DIAGNOSIS — J4551 Severe persistent asthma with (acute) exacerbation: Secondary | ICD-10-CM

## 2022-11-15 DIAGNOSIS — R0602 Shortness of breath: Secondary | ICD-10-CM

## 2022-11-15 DIAGNOSIS — J4550 Severe persistent asthma, uncomplicated: Secondary | ICD-10-CM

## 2022-11-15 LAB — NITRIC OXIDE: Nitric Oxide: 69

## 2022-11-15 MED ORDER — PREDNISONE 10 MG PO TABS
ORAL_TABLET | ORAL | 0 refills | Status: AC
  Filled 2022-11-15: qty 30, 29d supply, fill #0

## 2022-11-15 MED ORDER — METHYLPREDNISOLONE ACETATE 80 MG/ML IJ SUSP
120.0000 mg | Freq: Once | INTRAMUSCULAR | Status: AC
  Administered 2022-11-15: 120 mg via INTRAMUSCULAR

## 2022-11-15 MED ORDER — MONTELUKAST SODIUM 10 MG PO TABS
10.0000 mg | ORAL_TABLET | Freq: Every day | ORAL | 3 refills | Status: DC
  Filled 2022-11-15 – 2022-12-20 (×2): qty 90, 90d supply, fill #0

## 2022-11-15 MED ORDER — PREDNISONE 10 MG PO TABS
10.0000 mg | ORAL_TABLET | Freq: Every day | ORAL | 2 refills | Status: DC
  Filled 2022-11-15 – 2022-12-13 (×2): qty 30, 30d supply, fill #0
  Filled 2023-01-21: qty 30, 30d supply, fill #1
  Filled 2023-03-14: qty 30, 30d supply, fill #2

## 2022-11-15 MED ORDER — BUDESONIDE-FORMOTEROL FUMARATE 160-4.5 MCG/ACT IN AERO
2.0000 | INHALATION_SPRAY | Freq: Two times a day (BID) | RESPIRATORY_TRACT | 5 refills | Status: DC
  Filled 2022-11-15: qty 10.3, 31d supply, fill #0

## 2022-11-15 NOTE — Telephone Encounter (Signed)
Be advised, application that was provided to pt to sign is very outdated. Please coordinate with pt to obtain signatures on the correct form and fax back to [name] at your earliest convenience.  Enrollment forms for all specialty pulmonary medicines are easily accessible and are updated regularly through theTeams app:   Alternatively, the most recent Dupixent enrollment form can be accessed by copy-and-pasting the following link into a browser:  [email or web address]-to-Severe%20Eosinophilic%20or%20OCS-dependent%20Asthma%20(English).pdf   Please ensure that any existing Dupixent enrollment forms are disposed of and/or replaced if they do not contain the date 11/2021 at the bottom center of the page. If anyone needs access to the [facility] Office/Clinic/Admin group, feel free to reach out to me on Teams and I will add you to the list. Thank you!

## 2022-11-15 NOTE — Telephone Encounter (Signed)
Patient remains uninsured. Will await PAP forms for submission to [employer] pt assistance program  Chesley Mires, PharmD, MPH, BCPS, CPP Clinical Pharmacist (Rheumatology and Pulmonology)

## 2022-11-15 NOTE — Progress Notes (Signed)
Agree with the details of the visit as noted by Micheline Maze, NP.  Difficult to manage per patient due to poor compliance with medications and poor insight as to his disease process.  Attempts at educating him have failed due to poor understanding of his disease process.  We will continue to keep educating the patient regards to asthma.  The assessment and plan have been agreed upon.  Gailen Shelter, MD Advanced Bronchoscopy PCCM [facility]

## 2022-11-15 NOTE — Progress Notes (Signed)
@Patient  ID: Shannon Knapp, male    DOB: 1986/01/17, 37 y.o.   MRN: [id number]  Chief Complaint  Patient presents with   Follow-up    Cough is better on prednisone. SOB with coughing spells and at work in the cooler. Wheezing at night.    Referring provider: [name]  HPI: 37 year old male, former smoker followed for severe persistent asthma steroid-dependent, ABPA, allergic rhinitis.  Patient Dr. Jayme Knapp.  He was last seen via video visit 08/08/2021.  Past medical history significant for GERD, protein calorie malnutrition.  TEST/EVENTS:  10/29/2017 PFT: FVC 124, FEV1 61, ratio 41, TLC 166, DLCOcor 101. Moderate obstructive airway disease with air trapping.  08/08/2021: OV with Shannon Ridges NP for video visit.  Treated for acute asthma exacerbation secondary to sinobronchitis with Levaquin and prednisone course on 07/18/2021.  Doing well.  Breathing is fine.  Currently dealing with some nasal congestion.  Ran out of Flonase nasal spray and Symbicort yesterday.  Otherwise compliant with inhaler, Dupixent, Singulair and prednisone 10 mg daily.  Previously unsuccessful at decreasing daily prednisone.  May consider decreasing at next visit.  Refilled medications.  Advised to use Nettie pot for nasal congestion as needed.  11/15/2022: Today-follow-up Patient resents today for overdue follow-up.  He had an ED visit in May 2024 with exacerbation of his asthma as well as otitis media.  He was treated with azithromycin and prednisone burst.  He contacted the office on 10/30/2022 saying that he was still wheezing and having shortness of breath.  He also continued to have a productive cough.  He had finished the antibiotics and was requesting a refill of prednisone.  He had admitted to stopping Dupixent at this time.  He was restarted on prednisone 2 tablets daily for 3 days and then instructed to stay on 10 mg daily until he was seen in the office.  He finished this today and does not have any  further refills. He's feeling better but still having some increased shortness of breath with activity and chest tightness. 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 He does still have a cough but it has improved. He will occasionally produce a small amount of cream to yellow phlegm, but again, this is improved. He has not had any further episodes of hemoptysis. He denies any fevers, chills, night sweats, anorexia, weight loss, chest pain, palpitations, dizziness. He has a nebulizer machine at home. He needs refills of his inhalers and singulair. He tells me he was doing well when he was on the Dupixent. He stopped it because he thought he was better and didn't need it. He is willing to resume therapy.   FeNO 69 ppb  Allergies  Allergen Reactions   Amoxicillin Hives    Hives all over body. 2009-[facility] in [place], [place]. Has patient had a PCN reaction causing immediate rash, facial/tongue/throat swelling, SOB or lightheadedness with hypotension: Yes Has patient had a PCN reaction causing severe rash involving mucus membranes or skin necrosis: No Has patient had a PCN reaction that required hospitalization: No Has patient had a PCN reaction occurring within the last 10 years: Yes If all of the above answers are "NO", then may proceed with Cephalosporin use.   Omalizumab Anaphylaxis    Unclear if this was due to omalizumab as the patient had COVID-19 at the time.  Complaint was that of sore throat, tight throat.   Doxycycline  Fish Allergy Swelling   Shellfish Allergy Swelling   Penicillins Hives    Has patient had a PCN reaction causing immediate rash, facial/tongue/throat swelling, SOB or lightheadedness with hypotension: Yes Has patient had a PCN reaction causing severe rash involving mucus membranes or skin necrosis: No Has patient had a PCN reaction that required hospitalization: No Has patient had a PCN reaction  occurring within the last 10 years: Yes If all of the above answers are "NO", then may proceed with Cephalosporin use.    Immunization History  Administered Date(s) Administered   [employer] Vaccination 03/24/2020, 05/01/2020   Pneumococcal Polysaccharide-23 10/19/2017    Past Medical History:  Diagnosis Date   Allergic bronchopulmonary aspergillosis (HCC)    Asthma    Chronic bronchitis (HCC)     Tobacco History: Social History   Tobacco Use  Smoking Status Former   Packs/day: 1.00   Years: 14.00   Additional pack years: 0.00   Total pack years: 14.00   Types: Cigarettes   Start date: 2006   Quit date: 08/2018   Years since quitting: 4.2  Smokeless Tobacco Never   Counseling given: Not Answered   Outpatient Medications Prior to Visit  Medication Sig Dispense Refill   albuterol (PROAIR HFA) 108 (90 Base) MCG/ACT inhaler Inhale 2 puffs into the lungs every 6 (six) hours as needed for wheezing or shortness of breath. 18 g 5   albuterol (PROVENTIL) (2.5 MG/3ML) 0.083% nebulizer solution Take 3 mLs (2.5 mg total) by nebulization every 4 (four) hours as needed for wheezing or shortness of breath. 90 mL 2   EPINEPHrine (EPIPEN 2-PAK) 0.3 mg/0.3 mL IJ SOAJ injection Inject 0.3mg  into the muscle as needed for anaphylaxis 4 each 3   fluticasone (FLONASE) 50 MCG/ACT nasal spray Spray 1 spray into both nostrils once daily. 16 g 2   Nebulizers (COMPRESSOR/NEBULIZER) MISC 1 Dose by Does not apply route daily. 1 each 0   budesonide-formoterol (BREYNA) 160-4.5 MCG/ACT inhaler Inhale 2 puffs into the lungs in the morning and at bedtime. 10.3 g 0   montelukast (SINGULAIR) 10 MG tablet TAKE ONE TABLET BY MOUTH ONCE DAILY AT BEDTIME. 90 tablet 2   benzonatate (TESSALON PERLES) 100 MG capsule Take 1 capsule (100 mg total) by mouth 3 (three) times daily as needed for cough. (Patient not taking: Reported on 11/15/2022) 30 capsule 0   Dupilumab (DUPIXENT) 300 MG/2ML SOPN Inject 300 mg  into the skin every 14 (fourteen) days. Loading dose received in clinic on 01/12/21 (Patient not taking: Reported on 11/15/2022) 12 mL 1   EPINEPHrine (EPIPEN 2-PAK) 0.3 mg/0.3 mL IJ SOAJ injection INJECT 0.3MG  INTRAMUSCULARLY AS DIRECTED ONCE AS NEEDED. (Patient not taking: Reported on 11/15/2022) 4 each 3   levofloxacin (LEVAQUIN) 500 MG tablet Take 1 tablet (500 mg total) by mouth once daily for 7 days. (Patient not taking: Reported on 11/15/2022) 7 tablet 0   loratadine (CLARITIN) 10 MG tablet Take 1 tablet (10 mg total) by mouth daily. (Patient not taking: Reported on 11/15/2022)  0   budesonide-formoterol (SYMBICORT) 160-4.5 MCG/ACT inhaler Inhale 2 puffs into the lungs in the morning and at bedtime. (Patient not taking: Reported on 11/15/2022) 10.2 g 12   predniSONE (DELTASONE) 10 MG tablet Take 2 tablets (20 mg total) by mouth daily with breakfast for 3 days, THEN 1 tablet (10 mg total) daily with breakfast for 13 days. Until seen in office on 6/28.. (Patient not taking: Reported on 11/15/2022) 19 tablet 0   No facility-administered  medications prior to visit.     Review of Systems:   Constitutional: No weight loss or gain, night sweats, fevers, chills, fatigue, or lassitude. HEENT: No headaches, difficulty swallowing, tooth/dental problems, or sore throat. No sneezing, itching, ear ache, nasal congestion, or post nasal drip CV:  No chest pain, orthopnea, PND, swelling in lower extremities, anasarca, dizziness, palpitations, syncope Resp: +shortness of breath with exertion; cough; nocturnal wheeze. No hemoptysis. No chest wall deformity GI:  No heartburn, indigestion GU: No dysuria, change in color of urine, urgency or frequency.   Skin: No rash, lesions, ulcerations MSK:  No joint pain or swelling.   Neuro: No dizziness or lightheadedness.  Psych: No depression or anxiety. Mood stable.     Physical Exam:  BP 120/70 (BP Location: Left Arm, Cuff Size: Normal)   Pulse 93   Temp 98.1 F  (36.7 C)   Ht 5\' 11"  (1.803 m)   Wt 161 lb 12.8 oz (73.4 kg)   SpO2 97%   BMI 22.57 kg/m   GEN: Pleasant, interactive, well-appearing; in no acute distress HEENT:  Normocephalic and atraumatic. PERRLA. Sclera white. Nasal turbinates pink, moist and patent bilaterally. No rhinorrhea present. Oropharynx pink and moist, without exudate or edema. No lesions, ulcerations, or postnasal drip.  NECK:  Supple w/ fair ROM. No JVD present. Normal carotid impulses w/o bruits. Thyroid symmetrical with no goiter or nodules palpated. No lymphadenopathy.   CV: RRR, no m/r/g, no peripheral edema. Pulses intact, +2 bilaterally. No cyanosis, pallor or clubbing. PULMONARY:  Unlabored, regular breathing. End expiratory wheeze bilaterally A&P. No accessory muscle use.  GI: BS present and normoactive. Soft, non-tender to palpation. No organomegaly or masses detected.  MSK: No erythema, warmth or tenderness. Cap refil <2 sec all extrem. No deformities or joint swelling noted.  Neuro: A/Ox3. No focal deficits noted.   Skin: Warm, no lesions or rashe Psych: Normal affect and behavior. Judgement and thought content appropriate.     Lab Results:  CBC    Component Value Date/Time   WBC 4.2 08/31/2020 0500   RBC 4.95 08/31/2020 0500   HGB 15.8 08/31/2020 0500   HCT 45.4 08/31/2020 0500   PLT 297 08/31/2020 0500   MCV 91.7 08/31/2020 0500   MCH 31.9 08/31/2020 0500   MCHC 34.8 08/31/2020 0500   RDW 13.3 08/31/2020 0500   LYMPHSABS 1.0 08/29/2020 1304   MONOABS 0.5 08/29/2020 1304   EOSABS 1.6 (H) 08/29/2020 1304   BASOSABS 0.0 08/29/2020 1304    BMET    Component Value Date/Time   NA 135 08/31/2020 0500   K 4.4 08/31/2020 0500   CL 101 08/31/2020 0500   CO2 24 08/31/2020 0500   GLUCOSE 116 (H) 08/31/2020 0500   BUN 8 08/31/2020 0500   CREATININE 0.77 08/31/2020 0500   CALCIUM 8.6 (L) 08/31/2020 0500   GFRNONAA >60 08/31/2020 0500   GFRAA >60 12/21/2019 0849    BNP No results found for:  "BNP"   Imaging:  No results found.  methylPREDNISolone acetate (DEPO-MEDROL) injection 120 mg     Date Action Dose Route User   11/15/2022 1112 Given 120 mg Intramuscular (Left Upper Outer Quadrant) Bonney Leitz, CMA          Latest Ref Rng & Units 10/29/2017    2:58 PM  PFT Results  FVC-Pre L 5.81   FVC-Predicted Pre % 124   Pre FEV1/FVC % % 41   FEV1-Pre L 2.38   FEV1-Predicted Pre % 61  DLCO uncorrected ml/min/mmHg 35.24   DLCO UNC% % 104   DLCO corrected ml/min/mmHg 34.14   DLCO COR %Predicted % 101   DLVA Predicted % 97   TLC L 11.87   TLC % Predicted % 166   RV % Predicted % 360     Lab Results  Component Value Date   NITRICOXIDE 69 11/15/2022        Assessment & Plan:   Asthma in adult, severe persistent, with acute exacerbation Severe asthma, poorly controlled. Slow to resolve exacerbation with elevated exhaled nitric oxide on testing today. VS stable on room air. Depo 120 mg inj x 1 and extended prednisone taper. He will resume 10 mg daily after completing taper. Hold off on further antimicrobial therapy as infectious symptoms have resolved/improved. Rx refilled. We will restart biologic therapy. Re-educated on the importance of compliance. Verbalized understanding. Medication education provided. Action plan in place. Close follow up and strict ED precautions.   Patient Instructions  Continue Albuterol inhaler 2 puffs or 3 mL neb every 6 hours as needed for shortness of breath or wheezing. Notify if symptoms persist despite rescue inhaler/neb use.  Continue Breyna 2 puffs Twice daily. Brush tongue and rinse mouth afterwards Continue flonase nasal spray 1 spray each nostril daily Restart montelukast (singulair) 1 tab At bedtime  Continue claritin 1 tab daily  Prednisone taper. 4 tabs for 3 days, then 3 tabs for 3 days, 2 tabs for 3 days, then stay on 1 tab daily. Take in AM with food. Start taper tomorrow.  Restart dupixent injections every 2  weeks - pharmacy team will contact you  Follow up in 2 weeks with Dr. Jayme Knapp or Katie Kla Bily,NP. If symptoms do not improve or worsen, please contact office for sooner follow up or seek emergency care.    I spent 35 minutes of dedicated to the care of this patient on the date of this encounter to include pre-visit review of records, face-to-face time with the patient discussing conditions above, post visit ordering of testing, clinical documentation with the electronic health record, making appropriate referrals as documented, and communicating necessary findings to members of the patients care team.  Noemi Chapel, NP 11/15/2022  Pt aware and understands NP's role.

## 2022-11-15 NOTE — Telephone Encounter (Signed)
I spoke with the patient. He will come in on Monday to sign the new form.

## 2022-11-15 NOTE — Telephone Encounter (Signed)
Dupixent enrollment form singed in the office today and faxed to the Pharmacy Team.

## 2022-11-15 NOTE — Assessment & Plan Note (Addendum)
Severe asthma, poorly controlled. Slow to resolve exacerbation with elevated exhaled nitric oxide on testing today. VS stable on room air. Depo 120 mg inj x 1 and extended prednisone taper. He will resume 10 mg daily after completing taper. Hold off on further antimicrobial therapy as infectious symptoms have resolved/improved. Rx refilled. We will restart biologic therapy. Re-educated on the importance of compliance. Verbalized understanding. Medication education provided. Action plan in place. Close follow up and strict ED precautions.   Patient Instructions  Continue Albuterol inhaler 2 puffs or 3 mL neb every 6 hours as needed for shortness of breath or wheezing. Notify if symptoms persist despite rescue inhaler/neb use.  Continue Breyna 2 puffs Twice daily. Brush tongue and rinse mouth afterwards Continue flonase nasal spray 1 spray each nostril daily Restart montelukast (singulair) 1 tab At bedtime  Continue claritin 1 tab daily  Prednisone taper. 4 tabs for 3 days, then 3 tabs for 3 days, 2 tabs for 3 days, then stay on 1 tab daily. Take in AM with food. Start taper tomorrow.  Restart dupixent injections every 2 weeks - pharmacy team will contact you  Follow up in 2 weeks with Dr. [name] or Katie Kena Limon,NP. If symptoms do not improve or worsen, please contact office for sooner follow up or seek emergency care.

## 2022-11-15 NOTE — Patient Instructions (Signed)
Continue Albuterol inhaler 2 puffs or 3 mL neb every 6 hours as needed for shortness of breath or wheezing. Notify if symptoms persist despite rescue inhaler/neb use.  Continue Breyna 2 puffs Twice daily. Brush tongue and rinse mouth afterwards Continue flonase nasal spray 1 spray each nostril daily Restart montelukast (singulair) 1 tab At bedtime  Continue claritin 1 tab daily  Prednisone taper. 4 tabs for 3 days, then 3 tabs for 3 days, 2 tabs for 3 days, then stay on 1 tab daily. Take in AM with food. Start taper tomorrow.  Restart dupixent injections every 2 weeks - pharmacy team will contact you  Follow up in 2 weeks with Shannon Knapp or Shannon Marjorie Lussier,NP. If symptoms do not improve or worsen, please contact office for sooner follow up or seek emergency care.

## 2022-11-18 ENCOUNTER — Other Ambulatory Visit: Payer: Self-pay

## 2022-11-18 NOTE — Telephone Encounter (Signed)
Updated forms have been signed and faxed back.

## 2022-11-27 ENCOUNTER — Ambulatory Visit: Payer: Medicaid Other | Admitting: Nurse Practitioner

## 2022-12-02 ENCOUNTER — Telehealth: Payer: Self-pay

## 2022-12-02 DIAGNOSIS — J4550 Severe persistent asthma, uncomplicated: Secondary | ICD-10-CM

## 2022-12-02 NOTE — Telephone Encounter (Signed)
Please see 12/02/2022 phone note. Will close encounter.

## 2022-12-02 NOTE — Telephone Encounter (Signed)
Pharmacy team has received correct application and will begin the BIV process for the pt.

## 2022-12-02 NOTE — Telephone Encounter (Signed)
Pharmacy team, please advise. Thanks 

## 2022-12-02 NOTE — Telephone Encounter (Signed)
PAP application received for Dupixent, pt has been on this medication previously however he states that he "weaned himself off" because he felt his symptoms had resolved to the point that he no longer needed it.  Submitted Patient Assistance Application to [employer] My[employer] for DUPIXENT along with provider portion. Will update patient when we receive a response.  Phone #: [phone] Fax #: [phone]

## 2022-12-04 MED ORDER — DUPIXENT 300 MG/2ML ~~LOC~~ SOAJ: 300.0000 mg | SUBCUTANEOUS | 1 refills | Status: DC

## 2022-12-04 NOTE — Telephone Encounter (Signed)
Received a fax from  [employer] regarding an approval for DUPIXENT patient assistance from 12/03/22 to 12/03/23. Approval letter sent to scan center.  Phone #: [phone] Fax #: [phone]  Received refill request. Refill for maintenance has been sent to [facility]  Chesley Mires, PharmD, MPH, BCPS, CPP Clinical Pharmacist (Rheumatology and Pulmonology)

## 2022-12-13 ENCOUNTER — Other Ambulatory Visit: Payer: Self-pay

## 2022-12-18 ENCOUNTER — Other Ambulatory Visit: Payer: Self-pay

## 2022-12-18 MED ORDER — BUDESONIDE-FORMOTEROL FUMARATE 160-4.5 MCG/ACT IN AERO
2.0000 | INHALATION_SPRAY | Freq: Two times a day (BID) | RESPIRATORY_TRACT | 3 refills | Status: DC
  Filled 2022-12-23: qty 30.9, 90d supply, fill #0

## 2022-12-20 ENCOUNTER — Other Ambulatory Visit: Payer: Self-pay

## 2022-12-20 MED ORDER — FLUTICASONE FUROATE-VILANTEROL 200-25 MCG/ACT IN AEPB
1.0000 | INHALATION_SPRAY | Freq: Every day | RESPIRATORY_TRACT | 0 refills | Status: DC
  Filled 2022-12-20: qty 60, 60d supply, fill #0

## 2022-12-23 ENCOUNTER — Other Ambulatory Visit: Payer: Self-pay

## 2023-01-01 ENCOUNTER — Other Ambulatory Visit: Payer: Self-pay

## 2023-01-01 MED FILL — Albuterol Sulfate Soln Nebu 0.083% (2.5 MG/3ML): RESPIRATORY_TRACT | 5 days supply | Qty: 90 | Fill #1 | Status: AC

## 2023-01-02 ENCOUNTER — Other Ambulatory Visit: Payer: Self-pay

## 2023-01-21 ENCOUNTER — Other Ambulatory Visit: Payer: Self-pay

## 2023-02-13 ENCOUNTER — Other Ambulatory Visit: Payer: Self-pay

## 2023-02-15 ENCOUNTER — Other Ambulatory Visit: Payer: Self-pay

## 2023-02-15 ENCOUNTER — Emergency Department
Admission: EM | Admit: 2023-02-15 | Discharge: 2023-02-16 | Disposition: A | Discharge status: Home / Self Care | Payer: Medicaid Other | Attending: Emergency Medicine | Admitting: Emergency Medicine

## 2023-02-15 ENCOUNTER — Emergency Department: Payer: Medicaid Other

## 2023-02-15 DIAGNOSIS — J4541 Moderate persistent asthma with (acute) exacerbation: Secondary | ICD-10-CM | POA: Insufficient documentation

## 2023-02-15 DIAGNOSIS — J4551 Severe persistent asthma with (acute) exacerbation: Secondary | ICD-10-CM

## 2023-02-15 MED ORDER — IPRATROPIUM-ALBUTEROL 0.5-2.5 (3) MG/3ML IN SOLN
6.0000 mL | Freq: Once | RESPIRATORY_TRACT | Status: AC
  Administered 2023-02-15: 6 mL via RESPIRATORY_TRACT
  Filled 2023-02-15: qty 6

## 2023-02-15 MED ORDER — BUDESONIDE-FORMOTEROL FUMARATE 160-4.5 MCG/ACT IN AERO
2.0000 | INHALATION_SPRAY | Freq: Two times a day (BID) | RESPIRATORY_TRACT | 2 refills | Status: DC
  Filled 2023-02-15: qty 10.2, 31d supply, fill #0

## 2023-02-15 MED ORDER — ALBUTEROL SULFATE (2.5 MG/3ML) 0.083% IN NEBU
5.0000 mg | INHALATION_SOLUTION | Freq: Once | RESPIRATORY_TRACT | Status: AC
  Administered 2023-02-15: 5 mg via RESPIRATORY_TRACT
  Filled 2023-02-15: qty 6

## 2023-02-15 MED ORDER — PREDNISONE 10 MG PO TABS: ORAL_TABLET | ORAL | 0 refills | Status: DC

## 2023-02-15 MED ORDER — ALBUTEROL SULFATE (2.5 MG/3ML) 0.083% IN NEBU
2.5000 mg | INHALATION_SOLUTION | RESPIRATORY_TRACT | Status: DC | PRN
  Filled 2023-02-15: qty 3

## 2023-02-15 MED ORDER — PREDNISONE 20 MG PO TABS
60.0000 mg | ORAL_TABLET | Freq: Once | ORAL | Status: AC
  Administered 2023-02-15: 60 mg via ORAL
  Filled 2023-02-15: qty 3

## 2023-02-15 NOTE — ED Triage Notes (Signed)
Pt reports history of asthma, pt reports has been congested and this morning reports is hard to breath, reports ran out of prednisone. Pt reports took his breathing treatment and feels that is not working. Pt talks in complete sentences no respiratory distress noted

## 2023-02-15 NOTE — ED Provider Notes (Signed)
[facility] Provider Note    Event Date/Time   First MD Initiated Contact with Patient 02/15/23 2215     (approximate)   History   Chief Complaint: sob  HPI  Shannon Knapp is a 37 y.o. male with a history of asthma who comes ED complaining of shortness of breath and wheezing that started about 3 days ago, gradually worsening, waxing and waning.  Denies chest pain.  Ran out of his inhaled corticosteroid.  Insufficient relief with his albuterol and Singulair.  No fevers chills body aches or productive cough     Physical Exam   Triage Vital Signs: ED Triage Vitals  Encounter Vitals Group     BP 02/15/23 2108 (!) 150/93     Systolic BP Percentile --      Diastolic BP Percentile --      Pulse Rate 02/15/23 2108 99     Resp 02/15/23 2108 20     Temp 02/15/23 2108 98.8 F (37.1 C)     Temp Source 02/15/23 2108 Oral     SpO2 02/15/23 2108 96 %     Weight 02/15/23 2109 175 lb 9.6 oz (79.7 kg)     Height 02/15/23 2109 5\' 11"  (1.803 m)     Head Circumference --      Peak Flow --      Pain Score 02/15/23 2109 8     Pain Loc --      Pain Education --      Exclude from Growth Chart --     Most recent vital signs: Vitals:   02/15/23 2108  BP: (!) 150/93  Pulse: 99  Resp: 20  Temp: 98.8 F (37.1 C)  SpO2: 96%    General: Awake, no distress.  CV:  Good peripheral perfusion.  Regular rate and rhythm Resp:  Normal effort.  Diffuse expiratory wheezing and prolonged expiratory phase.  Not in distress Abd:  No distention.  Other:  No lower extremity edema   ED Results / Procedures / Treatments   Labs (all labs ordered are listed, but only abnormal results are displayed) Labs Reviewed - No data to display   EKG Interpreted by me Sinus rhythm rate of 92.  Normal axis, normal intervals.  Normal QRS ST segments and T waves   RADIOLOGY Chest x-ray interpreted by me, appears normal.  Radiology report  reviewed   PROCEDURES:  Procedures   MEDICATIONS ORDERED IN ED: Medications  albuterol (PROVENTIL) (2.5 MG/3ML) 0.083% nebulizer solution 2.5 mg (has no administration in time range)  predniSONE (DELTASONE) tablet 60 mg (has no administration in time range)  ipratropium-albuterol (DUONEB) 0.5-2.5 (3) MG/3ML nebulizer solution 6 mL (has no administration in time range)     IMPRESSION / MDM / ASSESSMENT AND PLAN / ED COURSE  I reviewed the triage vital signs and the nursing notes.  DDx: ptx, pna, asthma exac, pleural effusion  Patient's presentation is most consistent with acute presentation with potential threat to life or bodily function.  Patient presents with shortness of breath and wheezing.  Presentation consistent with asthma exacerbation.  Chest x-ray and EKG unremarkable.  Refill for ICS inhaler sent to his pharmacy.  Prescription for prednisone taper.  Steroids and bronchodilators in the ED, stable for discharge       FINAL CLINICAL IMPRESSION(S) / ED DIAGNOSES   Final diagnoses:  Severe persistent asthma with acute exacerbation     Rx / DC Orders   ED Discharge Orders  Ordered    budesonide-formoterol (BREYNA) 160-4.5 MCG/ACT inhaler  2 times daily        02/15/23 2231    predniSONE (DELTASONE) 10 MG tablet        02/15/23 2233             Note:  This document was prepared using Dragon voice recognition software and may include unintentional dictation errors.   Sharman Cheek, MD 02/15/23 [phone]

## 2023-02-16 ENCOUNTER — Other Ambulatory Visit: Payer: Self-pay

## 2023-02-16 NOTE — ED Notes (Signed)
Patient symptoms improved after treatment. No signs of distress.

## 2023-02-17 ENCOUNTER — Telehealth: Payer: Self-pay | Admitting: Pulmonary Disease

## 2023-02-17 ENCOUNTER — Other Ambulatory Visit: Payer: Self-pay

## 2023-02-17 MED ORDER — FLUTICASONE FUROATE-VILANTEROL 200-25 MCG/ACT IN AEPB
1.0000 | INHALATION_SPRAY | Freq: Every day | RESPIRATORY_TRACT | 6 refills | Status: DC
  Filled 2023-02-17: qty 60, 60d supply, fill #0
  Filled 2023-04-16: qty 60, 30d supply, fill #1
  Filled 2023-05-28 (×2): qty 60, 30d supply, fill #2
  Filled 2023-07-02: qty 60, 30d supply, fill #3
  Filled 2023-08-04: qty 60, 30d supply, fill #4
  Filled 2023-09-04: qty 60, 30d supply, fill #5
  Filled 2023-10-03: qty 60, 30d supply, fill #6

## 2023-02-17 MED FILL — Albuterol Sulfate Soln Nebu 0.083% (2.5 MG/3ML): RESPIRATORY_TRACT | 5 days supply | Qty: 90 | Fill #2 | Status: AC

## 2023-02-17 NOTE — Telephone Encounter (Signed)
Called and spoke to patient. He is requesting refill on Breo. According to last OV note, he was instructed to continue to Symbicort. Pt stated that pharmacy unable to get symbicort with the assistance program.   Florentina Addison, please advise if okay to order Breo? Thanks

## 2023-02-17 NOTE — Telephone Encounter (Signed)
Patient is aware of below message/recommendations and voiced his understanding.  Breo ordered.  Appt scheduled 04/15/2023. He stated that he has not received dupixent.   Pharmacy team, can you guys assist with this?

## 2023-02-17 NOTE — Telephone Encounter (Signed)
Ok to switch to [employer] 200 mcg but he needs an OV. He hasn't been seen since he restarted Dupixent. Also was just in ED for asthma exacerbation.

## 2023-02-17 NOTE — Telephone Encounter (Signed)
Patient needs a refill on his inhaler Symbicort (rena).  Pharmacy [facility]

## 2023-02-18 NOTE — Telephone Encounter (Signed)
Patient needs to call [facility] to schedule Dupixent shipment. ATC pt. Phone went straight to VM. Left VM with [facility]'s phone number for him to reach out and schedule shipment (rx for 6 month supply was sent in July 2024)

## 2023-02-25 ENCOUNTER — Other Ambulatory Visit: Payer: Self-pay

## 2023-03-04 ENCOUNTER — Other Ambulatory Visit: Payer: Self-pay

## 2023-03-14 ENCOUNTER — Other Ambulatory Visit: Payer: Self-pay

## 2023-03-14 ENCOUNTER — Telehealth: Payer: Self-pay | Admitting: Pulmonary Disease

## 2023-03-14 NOTE — Telephone Encounter (Signed)
Pt is asking if he has refill on prednisone. He had to have his meds transferred due to primary pharmacy being closed so he is asking if he has refills

## 2023-03-14 NOTE — Telephone Encounter (Signed)
Spoke to patient. He stated that he contacted [facility] pharmacy and was told that Rx is being filled currently.  Nothing further needed.

## 2023-03-17 ENCOUNTER — Other Ambulatory Visit: Payer: Self-pay

## 2023-03-21 ENCOUNTER — Other Ambulatory Visit: Payer: Self-pay

## 2023-03-24 ENCOUNTER — Other Ambulatory Visit: Payer: Self-pay

## 2023-04-01 ENCOUNTER — Telehealth: Payer: Self-pay | Admitting: Pulmonary Disease

## 2023-04-01 MED ORDER — PREDNISONE 10 MG PO TABS: 10.0000 mg | ORAL_TABLET | Freq: Every day | ORAL | 0 refills | Status: DC

## 2023-04-01 NOTE — Telephone Encounter (Signed)
Patient would like refill for Prednisone. Pharmacy is [facility] [address]. Patient phone number is [phone].

## 2023-04-01 NOTE — Telephone Encounter (Signed)
Called patient to provide with [facility]: [phone] phone number. He states he was not contacted by anyone regarding approval. He'll reach out to the pharmacy  Chesley Mires, PharmD, MPH, BCPS, CPP Clinical Pharmacist (Rheumatology and Pulmonology)

## 2023-04-01 NOTE — Telephone Encounter (Signed)
Prednisone has been sent to preferred pharmacy.  Patient is aware and voiced his understanding.   He stated that he has not been contacted by our office in reference to Dupixent.   Pharmacy team, please advise. Thanks

## 2023-04-04 NOTE — Telephone Encounter (Signed)
[facility] calling to verify directions of Dupixent. Needs to know if this a restart

## 2023-04-04 NOTE — Telephone Encounter (Signed)
Patient does not have to be reloaded for Dupixent. Returned call to [employer] to advise. Spoke with Jinny Blossom.  Chesley Mires, PharmD, MPH, BCPS, CPP Clinical Pharmacist (Rheumatology and Pulmonology)

## 2023-04-15 ENCOUNTER — Other Ambulatory Visit: Payer: Self-pay

## 2023-04-15 ENCOUNTER — Encounter: Payer: Self-pay | Admitting: Nurse Practitioner

## 2023-04-15 ENCOUNTER — Ambulatory Visit (INDEPENDENT_AMBULATORY_CARE_PROVIDER_SITE_OTHER): Payer: Self-pay | Admitting: Nurse Practitioner

## 2023-04-15 ENCOUNTER — Telehealth: Payer: Self-pay

## 2023-04-15 VITALS — BP 136/86 | HR 69 | Temp 98.0°F | Ht 71.0 in | Wt 164.8 lb

## 2023-04-15 DIAGNOSIS — R0602 Shortness of breath: Secondary | ICD-10-CM

## 2023-04-15 DIAGNOSIS — J4551 Severe persistent asthma with (acute) exacerbation: Secondary | ICD-10-CM

## 2023-04-15 LAB — NITRIC OXIDE: Nitric Oxide: 191

## 2023-04-15 MED ORDER — METHYLPREDNISOLONE ACETATE 80 MG/ML IJ SUSP
120.0000 mg | Freq: Once | INTRAMUSCULAR | Status: AC
Start: 2023-04-15 — End: 2023-04-15
  Administered 2023-04-15: 120 mg via INTRAMUSCULAR

## 2023-04-15 MED ORDER — DUPIXENT 300 MG/2ML ~~LOC~~ SOAJ: SUBCUTANEOUS | 1 refills | Status: DC

## 2023-04-15 MED ORDER — PREDNISONE 10 MG PO TABS
ORAL_TABLET | ORAL | 0 refills | Status: AC
Start: 2023-04-15 — End: 2023-04-24
  Filled 2023-04-15: qty 28, 8d supply, fill #0

## 2023-04-15 MED ORDER — PREDNISONE 10 MG PO TABS
20.0000 mg | ORAL_TABLET | Freq: Every day | ORAL | 1 refills | Status: DC
Start: 2023-04-15 — End: 2023-07-02
  Filled 2023-04-15: qty 60, 30d supply, fill #0
  Filled 2023-05-28: qty 60, 30d supply, fill #1

## 2023-04-15 MED ORDER — AIRSUPRA 90-80 MCG/ACT IN AERO
1.0000 | INHALATION_SPRAY | Freq: Four times a day (QID) | RESPIRATORY_TRACT | 5 refills | Status: DC | PRN
  Filled 2023-04-15: qty 10.7, fill #0

## 2023-04-15 NOTE — Telephone Encounter (Signed)
Spoke with Dupixent [id number] pharmacy representative to re-order Dupixent loading dose. Patient has been off of Dupixent for 1.5 years. Left verbal prescription with representative for 3 months supply of Dupixent. She stated they will contact the patient to set up shipment once the prescription has been processed.   Sofie Rower, PharmD [facility] PGY-1

## 2023-04-15 NOTE — Progress Notes (Signed)
@Patient  ID: Shannon Knapp, male    DOB: [birth year], 37 y.o.   MRN: [id number]  Chief Complaint  Patient presents with   Follow-up    Occasional cough and wheezing. Using proair several times a day.     Referring provider: [name]  HPI: 37 year old male, former smoker followed for severe persistent asthma steroid-dependent, ABPA, allergic rhinitis.  Patient Dr. Jayme Knapp.  He was last seen via video visit 11/15/2022 by Royanne Warshaw,NP.  Past medical history significant for GERD, protein calorie malnutrition.  TEST/EVENTS:  10/29/2017 PFT: FVC 124, FEV1 61, ratio 41, TLC 166, DLCOcor 101. Moderate obstructive airway disease with air trapping.  08/08/2021: OV with Clent Ridges NP for video visit.  Treated for acute asthma exacerbation secondary to sinobronchitis with Levaquin and prednisone course on 07/18/2021.  Doing well.  Breathing is fine.  Currently dealing with some nasal congestion.  Ran out of Flonase nasal spray and Symbicort yesterday.  Otherwise compliant with inhaler, Dupixent, Singulair and prednisone 10 mg daily.  Previously unsuccessful at decreasing daily prednisone.  May consider decreasing at next visit.  Refilled medications.  Advised to use Nettie pot for nasal congestion as needed.  11/15/2022: OV with Tanna Loeffler NP for overdue follow-up.  He had an ED visit in May 2024 with exacerbation of his asthma as well as otitis media.  He was treated with azithromycin and prednisone burst.  He contacted the office on 10/30/2022 saying that he was still wheezing and having shortness of breath.  He also continued to have a productive cough.  He had finished the antibiotics and was requesting a refill of prednisone.  He had admitted to stopping Dupixent at this time.  He was restarted on prednisone 2 tablets daily for 3 days and then instructed to stay on 10 mg daily until he was seen in the office.  He finished this today and does not have any further refills. He's feeling better but still  having some increased shortness of breath with activity and chest tightness. 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 He does still have a cough but it has improved. He will occasionally produce a small amount of cream to yellow phlegm, but again, this is improved. He has not had any further episodes of hemoptysis. He denies any fevers, chills, night sweats, anorexia, weight loss, chest pain, palpitations, dizziness. He has a nebulizer machine at home. He needs refills of his inhalers and singulair. He tells me he was doing well when he was on the Dupixent. He stopped it because he thought he was better and didn't need it. He is willing to resume therapy.  FeNO 69 ppb  04/15/2023: Today - acute Discussed the use of AI scribe software for clinical note transcription with the patient, who gave verbal consent to proceed.  History of Present Illness   [name], a patient with severe asthma, presents with increased difficulty in breathing, particularly during work and at night. The patient reports waking up in the middle of the night due to wheezing. 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  No significant cough. No fevers, chills, hemoptysis, weight loss, night sweats.   The patient was previously approved to restart Dupixent but has not started the treatment due to issues with  the pharmacy. The patient has been on prednisone, which he has had to increase to 20 mg to manage his symptoms, despite his reluctance due to concerns about the side effects of the medication. The patient is also on Singulair, taken at bedtime.  The patient uses an albuterol inhaler, taking two puffs in the morning and additional doses as needed throughout the day, particularly during work. On weekends,  the patient typically only needs to use the inhaler in the morning and at night. During work days, he's using it 3-4 times a day. The patient was previously on Symbicort but has switched to [facility] due to availability issues. However, the patient is currently out of Breo and has had difficulties with the pharmacy regarding refills. He does have one ready for him.   The patient's last emergency room visit was in September. Since then, the patient reports that his condition improved but has recently started to worsen again. The patient's chest x-ray from September showed no signs of acute process. The patient also has dental caries, which he has been advised to address to prevent potential respiratory infections.     FeNO 191 ppb  Allergies  Allergen Reactions   Amoxicillin Hives    Hives all over body. 2009-[facility] in [place], [place]. Has patient had a PCN reaction causing immediate rash, facial/tongue/throat swelling, SOB or lightheadedness with hypotension: Yes Has patient had a PCN reaction causing severe rash involving mucus membranes or skin necrosis: No Has patient had a PCN reaction that required hospitalization: No Has patient had a PCN reaction occurring within the last 10 years: Yes If all of the above answers are "NO", then may proceed with Cephalosporin use.   Omalizumab Anaphylaxis    Unclear if this was due to omalizumab as the patient had COVID-19 at the time.  Complaint was that of sore throat, tight throat.   Doxycycline    Fish Allergy Swelling   Shellfish Allergy Swelling   Penicillins Hives    Has patient had a PCN reaction causing immediate rash, facial/tongue/throat swelling, SOB or lightheadedness with hypotension: Yes Has patient had a PCN reaction causing severe rash involving mucus membranes or skin necrosis: No Has patient had a PCN reaction that required hospitalization: No Has patient had a PCN reaction occurring within the last 10 years: Yes If all of the above  answers are "NO", then may proceed with Cephalosporin use.    Immunization History  Administered Date(s) Administered   [employer] Vaccination 03/24/2020, 05/01/2020   Pneumococcal Polysaccharide-23 10/19/2017    Past Medical History:  Diagnosis Date   Allergic bronchopulmonary aspergillosis (HCC)    Asthma    Chronic bronchitis (HCC)     Tobacco History: Social History   Tobacco Use  Smoking Status Former   Current packs/day: 0.00   Average packs/day: 1 pack/day for 14.2 years (14.2 ttl pk-yrs)   Types: Cigarettes   Start date: 2006   Quit date: 08/2018   Years since quitting: 4.6  Smokeless Tobacco Never   Counseling given: Not Answered   Outpatient Medications Prior to Visit  Medication Sig Dispense Refill   albuterol (PROAIR HFA) 108 (90 Base) MCG/ACT inhaler Inhale 2 puffs into the lungs every 6 (six) hours as needed for wheezing or shortness of breath. 18 g 5   albuterol (PROVENTIL) (2.5 MG/3ML) 0.083% nebulizer solution Take 3 mLs (2.5 mg total) by nebulization every 4 (four) hours as needed for wheezing or shortness of breath. 90 mL 2   EPINEPHrine (EPIPEN  2-PAK) 0.3 mg/0.3 mL IJ SOAJ injection Inject 0.3mg  into the muscle as needed for anaphylaxis 4 each 3   fluticasone furoate-vilanterol (BREO ELLIPTA) 200-25 MCG/ACT AEPB Inhale 1 puff into the lungs daily. 60 each 6   loratadine (CLARITIN) 10 MG tablet Take 1 tablet (10 mg total) by mouth daily.  0   Nebulizers (COMPRESSOR/NEBULIZER) MISC 1 Dose by Does not apply route daily. 1 each 0   predniSONE (DELTASONE) 10 MG tablet Take 1 tablet (10 mg total) by mouth daily with breakfast. 30 tablet 0   Dupilumab (DUPIXENT) 300 MG/2ML SOPN Inject 300 mg into the skin every 14 (fourteen) days. (Patient not taking: Reported on 04/15/2023) 12 mL 1   benzonatate (TESSALON PERLES) 100 MG capsule Take 1 capsule (100 mg total) by mouth 3 (three) times daily as needed for cough. (Patient not taking: Reported on 11/15/2022)  30 capsule 0   EPINEPHrine (EPIPEN 2-PAK) 0.3 mg/0.3 mL IJ SOAJ injection INJECT 0.3MG  INTRAMUSCULARLY AS DIRECTED ONCE AS NEEDED. (Patient not taking: Reported on 11/15/2022) 4 each 3   fluticasone (FLONASE) 50 MCG/ACT nasal spray Spray 1 spray into both nostrils once daily. 16 g 2   levofloxacin (LEVAQUIN) 500 MG tablet Take 1 tablet (500 mg total) by mouth once daily for 7 days. (Patient not taking: Reported on 11/15/2022) 7 tablet 0   montelukast (SINGULAIR) 10 MG tablet Take 1 tablet (10 mg total) by mouth at bedtime. 90 tablet 3   predniSONE (DELTASONE) 10 MG tablet Take 1 tablet (10 mg total) by mouth daily with breakfast. 30 tablet 2   No facility-administered medications prior to visit.     Review of Systems:   Constitutional: No weight loss or gain, night sweats, fevers, chills, fatigue, or lassitude. HEENT: No headaches, difficulty swallowing, tooth/dental problems, or sore throat. No sneezing, itching, ear ache, nasal congestion, or post nasal drip CV:  No chest pain, orthopnea, PND, swelling in lower extremities, anasarca, dizziness, palpitations, syncope Resp: +shortness of breath with exertion; nocturnal wheeze. No cough. No hemoptysis. No chest wall deformity GI:  No heartburn, indigestion GU: No dysuria, change in color of urine, urgency or frequency.   Skin: No rash, lesions, ulcerations MSK:  No joint pain or swelling.   Neuro: No dizziness or lightheadedness.  Psych: No depression or anxiety. Mood stable.     Physical Exam:  BP 136/86 (BP Location: Left Arm, Patient Position: Sitting, Cuff Size: Normal)   Pulse 69   Temp 98 F (36.7 C) (Temporal)   Ht 5\' 11"  (1.803 m)   Wt 164 lb 12.8 oz (74.8 kg)   SpO2 99%   BMI 22.98 kg/m   GEN: Pleasant, interactive, well-appearing; in no acute distress HEENT:  Normocephalic and atraumatic. PERRLA. Sclera white. Nasal turbinates pink, moist and patent bilaterally. No rhinorrhea present. Oropharynx pink and moist, without  exudate or edema. No lesions, ulcerations, or postnasal drip. Dental caries x 2 left molars NECK:  Supple w/ fair ROM. No JVD present. Normal carotid impulses w/o bruits. Thyroid symmetrical with no goiter or nodules palpated. No lymphadenopathy.   CV: RRR, no m/r/g, no peripheral edema. Pulses intact, +2 bilaterally. No cyanosis, pallor or clubbing. PULMONARY:  Unlabored, regular breathing. End expiratory wheeze bilaterally A&P. No accessory muscle use.  GI: BS present and normoactive. Soft, non-tender to palpation. No organomegaly or masses detected.  MSK: No erythema, warmth or tenderness. Cap refil <2 sec all extrem. No deformities or joint swelling noted.  Neuro: A/Ox3. No focal deficits noted.  Skin: Warm, no lesions or rashe Psych: Normal affect and behavior. Judgement and thought content appropriate.     Lab Results:  CBC    Component Value Date/Time   WBC 4.2 08/31/2020 0500   RBC 4.95 08/31/2020 0500   HGB 15.8 08/31/2020 0500   HCT 45.4 08/31/2020 0500   PLT 297 08/31/2020 0500   MCV 91.7 08/31/2020 0500   MCH 31.9 08/31/2020 0500   MCHC 34.8 08/31/2020 0500   RDW 13.3 08/31/2020 0500   LYMPHSABS 1.0 08/29/2020 1304   MONOABS 0.5 08/29/2020 1304   EOSABS 1.6 (H) 08/29/2020 1304   BASOSABS 0.0 08/29/2020 1304    BMET    Component Value Date/Time   NA 135 08/31/2020 0500   K 4.4 08/31/2020 0500   CL 101 08/31/2020 0500   CO2 24 08/31/2020 0500   GLUCOSE 116 (H) 08/31/2020 0500   BUN 8 08/31/2020 0500   CREATININE 0.77 08/31/2020 0500   CALCIUM 8.6 (L) 08/31/2020 0500   GFRNONAA >60 08/31/2020 0500   GFRAA >60 12/21/2019 0849    BNP No results found for: "BNP"   Imaging:  No results found.  Administration History     None          Latest Ref Rng & Units 10/29/2017    2:58 PM  PFT Results  FVC-Pre L 5.81   FVC-Predicted Pre % 124   Pre FEV1/FVC % % 41   FEV1-Pre L 2.38   FEV1-Predicted Pre % 61   DLCO uncorrected ml/min/mmHg 35.24   DLCO  UNC% % 104   DLCO corrected ml/min/mmHg 34.14   DLCO COR %Predicted % 101   DLVA Predicted % 97   TLC L 11.87   TLC % Predicted % 166   RV % Predicted % 360     Lab Results  Component Value Date   NITRICOXIDE 69 11/15/2022        Assessment & Plan:     Severe Asthma Severe asthmatic, prone to exacerbations. Hx of ABPA. Recent imaging clear. Previously managed on Dupixent. He was supposed to restart afte our last visit but has had difficulties with the pharmacy. Elevated exhaled nitric oxide testing today indicates significant type II inflammation. Will treat him with depo 120 mg inj x 1, prednisone taper then transition to 20 mg daily until seen back. Discussed risks of long-term prednisone use. Emphasized starting Dupixent to reduce prednisone reliance and for better symptom control. Will follow up with pharmacy team on this. Change rescue to Airsupra. Advised rinsing mouth after Airsupra to prevent oral thrush. Educated to restart Breo and importance of compliance with maintenance regimen. Action plan in place.  - Switch albuterol to Airsupra (1-2 puffs every 4-6 hours, max 12 puffs/24 hours) - Restart Breo - Call pharmacy to restart Dupixent - Continue Singulair at bedtime - Administer steroid shot today - Increase prednisone dose temporarily, then taper, maintaining 20 mg/day until Dupixent is started and symptoms stable - Schedule follow-up appointment  Dental Caries Dental caries on the left side. Discussed risk of lung infections with untreated dental caries.  - Make appt with dentist   Follow-up - Schedule follow-up appointment - Call if any issues arise.       I spent 42 minutes of dedicated to the care of this patient on the date of this encounter to include pre-visit review of records, face-to-face time with the patient discussing conditions above, post visit ordering of testing, clinical documentation with the electronic health record, making appropriate referrals  as documented, and communicating necessary findings to members of the patients care team.  Noemi Chapel, NP 04/15/2023  Pt aware and understands NP's role.

## 2023-04-15 NOTE — Patient Instructions (Addendum)
Switch Albuterol inhaler to Airsupra 1-2 puffs or 3 mL neb every 6 hours as needed for shortness of breath or wheezing. Notify if symptoms persist despite rescue inhaler/neb use. Brush tongue and rinse mouth after the Airsupra  Restart Breo 1 puff Twice daily. Brush tongue and rinse mouth afterwards Continue flonase nasal spray 1 spray each nostril daily Continue montelukast (singulair) 1 tab At bedtime  Continue claritin 1 tab daily Continue prednisone 20 mg daily. Take in AM with food.   Prednisone taper. 4 tabs for 4 days, then 3 tabs for 4 days then stay on 2 tabs (20 mg) daily. Take in AM with food. Start tomorrow   Restart dupixent injections every 2 weeks - pharmacy team will contact you [phone]    Follow up in 6 weeks with Dr. [name] or Katie Lorin Gawron,NP. If symptoms do not improve or worsen, please contact office for sooner follow up or seek emergency care.

## 2023-04-15 NOTE — Progress Notes (Signed)
Agree with the details of the visit as noted by Katherine Cobb, NP. ° °C. Laura Yasin Ducat, MD °Advanced Bronchoscopy °PCCM [facility] ° °

## 2023-04-16 ENCOUNTER — Other Ambulatory Visit: Payer: Self-pay

## 2023-04-22 ENCOUNTER — Other Ambulatory Visit: Payer: Self-pay

## 2023-05-05 ENCOUNTER — Telehealth: Payer: Self-pay | Admitting: Pulmonary Disease

## 2023-05-05 NOTE — Telephone Encounter (Signed)
I tried to contact the patient. I did not get an answer and his voicemail box was full. I will try again later.

## 2023-05-05 NOTE — Telephone Encounter (Signed)
Patient okay to restart Dupixent at home

## 2023-05-28 ENCOUNTER — Other Ambulatory Visit: Payer: Self-pay

## 2023-06-02 ENCOUNTER — Other Ambulatory Visit: Payer: Self-pay

## 2023-06-04 ENCOUNTER — Telehealth: Payer: Self-pay

## 2023-06-04 ENCOUNTER — Ambulatory Visit (INDEPENDENT_AMBULATORY_CARE_PROVIDER_SITE_OTHER): Payer: Self-pay | Admitting: Pulmonary Disease

## 2023-06-04 ENCOUNTER — Encounter: Payer: Self-pay | Admitting: Pulmonary Disease

## 2023-06-04 VITALS — BP 128/80 | HR 102 | Temp 97.1°F | Ht 71.0 in | Wt 168.0 lb

## 2023-06-04 DIAGNOSIS — J4550 Severe persistent asthma, uncomplicated: Secondary | ICD-10-CM

## 2023-06-04 DIAGNOSIS — J309 Allergic rhinitis, unspecified: Secondary | ICD-10-CM

## 2023-06-04 LAB — NITRIC OXIDE: Nitric Oxide: 32

## 2023-06-04 MED ORDER — DUPIXENT 300 MG/2ML ~~LOC~~ SOAJ
300.0000 mg | SUBCUTANEOUS | 1 refills | Status: DC
Start: 2023-06-04 — End: 2023-08-13

## 2023-06-04 NOTE — Progress Notes (Signed)
 Subjective:    Patient ID: Shannon Knapp, male    DOB: 11/24/1985, 38 y.o.   MRN: [id number]  Patient Care Team: Center, [facility] as PCP - General (General Practice)  Chief Complaint  Patient presents with   Follow-up    No SOB, wheezing or cough.     BACKGROUND/INTERVAL:  38 year old male, former smoker followed for severe persistent asthma steroid-dependent, ABPA, allergic rhinitis.  Patient Dr. Viva Grise.  He was last seen via video visit 11/15/2022 by Cobb,NP.  Past medical history significant for GERD, protein calorie malnutrition.  Last seen by Canary Ceo on the 26 March 2023 I have not seen since since 22 February 2021.  At his prior visit in November he was restarted on Dupixent  and on his asthma controller medications.  HPI Discussed the use of AI scribe software for clinical note transcription with the patient, who gave verbal consent to proceed.  History of Present Illness   The patient, with a history of severe persistent asthma, has been managing his condition with Dupixent  and Breo Ellipta . He has experienced an improvement in his breathing since starting Dupixent . He also uses Breo two to three times a week, but has been advised to use it consistently during seasonal changes, which are known to trigger his symptoms.  The patient works at [employer], where he is exposed to cold temperatures. He always carries his albuterol  with him for emergency use.  He also keeps his mouth covered so as not to breathe too much cold air when he is going into the coolers and freezers at the [employer].  In his last consultation, the patient's nitric oxide  levels, indicative of airway inflammation, were significantly elevated at 191 (normal levels are below 40 in adults). However, with the use of Dupixent , Breo, and albuterol , his nitric oxide  levels have significantly improved to 32, indicating good control of his asthma.   Today he states that he feels well and looks  well.    Review of Systems A 10 point review of systems was performed and it is as noted above otherwise negative.   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/07/2020   Community acquired pneumonia of left lower lobe of lung 10/25/2019   Medication management 04/30/2019   Healthcare maintenance 11/26/2018   Severe persistent asthma 05/28/2018   Acute febrile illness 04/10/2018   Upper airway cough syndrome    Hypoxemia    Syncope    Allergic rhinitis    Cough    Hemoptysis    Protein-calorie malnutrition, severe 10/20/2017   Acute respiratory failure with hypoxemia (HCC) 10/20/2017   Acute respiratory distress    Asthma exacerbation    Status asthmaticus 10/18/2017   Severe persistent acute asthmatic bronchitis 10/17/2017   Elevated blood pressure reading without diagnosis of hypertension 10/17/2017   Respiratory distress 10/17/2017    Social History   Tobacco Use   Smoking status: Former    Current packs/day: 0.00    Average packs/day: 1 pack/day for 14.2 years (14.2  ttl pk-yrs)    Types: Cigarettes    Start date: 2006    Quit date: 08/2018    Years since quitting: 4.7   Smokeless tobacco: Never  Substance Use Topics   Alcohol use: Not Currently    Alcohol/week: 15.0 standard drinks of alcohol    Types: 15 Cans of beer per week    Allergies  Allergen Reactions   Amoxicillin Hives    Hives all over body. 2009-[facility] in [place], [place]. Has patient had a PCN  reaction causing immediate rash, facial/tongue/throat swelling, SOB or lightheadedness with hypotension: Yes Has patient had a PCN reaction causing severe rash involving mucus membranes or skin necrosis: No Has patient had a PCN reaction that required hospitalization: No Has patient had a PCN reaction occurring within the last 10 years: Yes If all of the above answers are "NO", then may proceed with Cephalosporin use.   Omalizumab  Anaphylaxis    Unclear if this was due to omalizumab  as the patient had COVID-19 at the time.  Complaint was that of sore throat, tight throat.   Doxycycline     Fish Allergy Swelling   Shellfish Allergy Swelling   Penicillins Hives    Has patient had a PCN reaction causing immediate rash, facial/tongue/throat swelling, SOB or lightheadedness with hypotension: Yes Has patient had a PCN reaction causing severe rash involving mucus membranes or skin necrosis: No Has patient had a PCN reaction that required hospitalization: No Has patient had a PCN reaction occurring within the last 10 years: Yes If all of the above answers are "NO", then may proceed with Cephalosporin use.    Current Meds  Medication Sig   albuterol  (PROVENTIL ) (2.5 MG/3ML) 0.083% nebulizer solution Take 3 mLs (2.5 mg total) by nebulization every 4 (four) hours as needed for wheezing or shortness of breath.   Albuterol -Budesonide  (AIRSUPRA ) 90-80 MCG/ACT AERO Inhale 1-2 puffs into the lungs every 6 (six) hours as needed (shortness of breath, wheezing).   Dupilumab  (DUPIXENT ) 300 MG/2ML SOAJ Inject 600 mg subcutaneously once.Then 300 mg every other week thereafter.   EPINEPHrine  (EPIPEN  2-PAK) 0.3 mg/0.3 mL IJ SOAJ injection Inject 0.3mg  into the muscle as needed for anaphylaxis   fluticasone  furoate-vilanterol (BREO ELLIPTA ) 200-25 MCG/ACT AEPB Inhale 1 puff into the lungs daily.   loratadine  (CLARITIN ) 10 MG tablet Take 1 tablet (10 mg total) by mouth daily.   Nebulizers (COMPRESSOR/NEBULIZER) MISC  1 Dose by Does not apply route daily.   predniSONE  (DELTASONE ) 10 MG tablet Take 2 tablets (20 mg total) by mouth daily with breakfast.    Immunization History  Administered Date(s) Administered   Moderna Sars-Covid-2 Vaccination 03/24/2020, 05/01/2020   Pneumococcal Polysaccharide-23 10/19/2017        Objective:     BP 128/80 (BP Location: Right Arm, Cuff Size: Normal)   Pulse (!) 102   Temp (!) 97.1 F (36.2 C)   Ht 5\' 11"  (1.803 m)   Wt 168 lb (76.2 kg)   SpO2 99%   BMI 23.43 kg/m   SpO2: 99 % O2 Device: None (Room air)  GENERAL: Well-developed, well-nourished gentleman in no acute distress.  No conversational dyspnea.  Looks fit.  Fully ambulatory. HEAD: Normocephalic, atraumatic.  EYES: Pupils equal, round, reactive to light.  No scleral icterus.  MOUTH: Poor dentition, oral mucosa moist.  No thrush. NECK: Supple. No thyromegaly. Trachea midline. No JVD.  No adenopathy. PULMONARY: Symmetrical air entry, no adventitious sounds.  No prolonged expiratory phase.  CARDIOVASCULAR: S1 and S2.  Regular  rate and rhythm.  No rubs, murmurs or gallops heard. ABDOMEN: Benign. MUSCULOSKELETAL: No joint deformity, no clubbing, no edema.  NEUROLOGIC: No overt focal deficit. SKIN: Intact,warm,dry. PSYCH: Mood and behavior normal  Lab Results  Component Value Date   NITRICOXIDE 32 06/04/2023  *191>>32   Assessment & Plan:     ICD-10-CM   1. Severe persistent asthma without complication  J45.50 Nitric oxide     2. Allergic rhinitis, unspecified seasonality, unspecified trigger  J30.9       Orders Placed This Encounter  Procedures   Nitric oxide    Discussion:    Severe Persistent Asthma Severe persistent asthma, well-controlled with Dupixent , Breo, and albuterol . Reports improved breathing since starting Dupixent . Recent nitric oxide  level of 32 (down from 191) indicates reduced airway inflammation. Works in a cold environment, which may trigger symptoms. Uses Breo 2-3  times a week. Emphasized consistent use of Breo during seasonal changes and carrying albuterol  at all times. Discussed Dupixent 's benefits in maintaining control and the need for regular monitoring. - Renew Dupixent  prescription, message sent to the pharmacy team - Monitor symptoms, especially during seasonal changes - Use Breo consistently during symptom triggers - Always carry albuterol  - Follow-up in 2-3 months.      Advised if symptoms do not improve or worsen, to please contact office for sooner follow up or seek emergency care.    I spent 40 minutes of dedicated to the care of this patient on the date of this encounter to include pre-visit review of records, face-to-face time with the patient discussing conditions above, post visit ordering of testing, clinical documentation with the electronic health record, making appropriate referrals as documented, and communicating necessary findings to members of the patients care team.     C. Chloe Counter, MD Advanced Bronchoscopy PCCM  [facility]    *This note was generated using voice recognition software/Dragon and/or AI transcription program.  Despite best efforts to proofread, errors can occur which can change the meaning. Any transcriptional errors that result from this process are unintentional and may not be fully corrected at the time of dictation.

## 2023-06-04 NOTE — Patient Instructions (Addendum)
 VISIT SUMMARY:  During your visit, we discussed your severe persistent asthma and the improvements you've experienced with your current treatment plan. Your breathing has improved since starting Dupixent , and your nitric oxide  levels, which indicate airway inflammation, have significantly decreased. We also talked about the importance of using Breo consistently, especially during seasonal changes, and always carrying your albuterol  inhaler.  YOUR PLAN:  -SEVERE PERSISTENT ASTHMA: Severe persistent asthma is a long-term condition where the airways in your lungs are always inflamed and can become narrowed, making it hard to breathe. Your asthma is well-controlled with Dupixent , Breo, and albuterol . Your recent nitric oxide  level of 32 shows that your airway inflammation has greatly improved from 191. Continue using Dupixent  and Breo as prescribed, especially during seasonal changes, and always carry your albuterol  inhaler for emergencies. We will monitor your symptoms regularly to ensure your asthma remains well-controlled. We sent a message to the pharmacy team so that your Dupixent  discontinued.  INSTRUCTIONS:  Please follow up in 2-3 months for regular monitoring of your asthma symptoms and to ensure your treatment plan remains effective.

## 2023-06-04 NOTE — Telephone Encounter (Signed)
 Patient was seen in the office today. He said he needs a refill on his Dupixent .  Pharmacy team please refill. Thank You!

## 2023-06-04 NOTE — Telephone Encounter (Signed)
 Refill for Dupixent  sent to [facility] pharmacy today.  Geraldene Kleine, PharmD, MPH, BCPS, CPP Clinical Pharmacist (Rheumatology and Pulmonology)

## 2023-06-23 ENCOUNTER — Telehealth: Payer: Self-pay | Admitting: Pulmonary Disease

## 2023-06-23 NOTE — Telephone Encounter (Signed)
Patient needs a refill on his Dupixent. He is supposed to take it this week so a sample is needed. Please call and advise [phone]

## 2023-06-24 NOTE — Telephone Encounter (Signed)
 Patient needs to call pharmacy to schedule Dupixent  shipment. Rx was sent on 06/04/2023. He can ask pharmacy to expedite shipment if needed  ATC patient - phone went to VM and VM box is full.  Sherry Pennant, PharmD, MPH, BCPS, CPP Clinical Pharmacist (Rheumatology and Pulmonology)

## 2023-06-27 ENCOUNTER — Telehealth: Payer: Self-pay | Admitting: Pulmonary Disease

## 2023-06-27 NOTE — Telephone Encounter (Signed)
 Patient states Dr.Gonzales called pharmacy on the day he was in office. Since he has not received the dupixent  yet he would like to come in to get a sample. He is due for his next dose on February 9th. [phone]

## 2023-06-27 NOTE — Telephone Encounter (Addendum)
 Patient neesd to call pharmacy - it is not the physician office that sets up refill. I called him that day and he did not pik up and his VM box si full. Unfortunately we do nothave any samples at this time to provide because we have them reserved ofr new starts next week  Rx was sent to [facility] on 06/04/2023 and patient needs to call them to schedule shipment [phone]  Sherry Pennant, PharmD, MPH, BCPS, CPP Clinical Pharmacist (Rheumatology and Pulmonology)

## 2023-06-27 NOTE — Telephone Encounter (Addendum)
 Patient returned call and was advised to call [facility]. Phone number provided. Nothing further needed.

## 2023-07-02 ENCOUNTER — Other Ambulatory Visit: Payer: Self-pay | Admitting: Nurse Practitioner

## 2023-07-02 ENCOUNTER — Other Ambulatory Visit: Payer: Self-pay

## 2023-07-02 DIAGNOSIS — J4551 Severe persistent asthma with (acute) exacerbation: Secondary | ICD-10-CM

## 2023-07-03 ENCOUNTER — Telehealth: Payer: Self-pay | Admitting: Pulmonary Disease

## 2023-07-03 ENCOUNTER — Other Ambulatory Visit: Payer: Self-pay

## 2023-07-03 DIAGNOSIS — J4551 Severe persistent asthma with (acute) exacerbation: Secondary | ICD-10-CM

## 2023-07-03 MED ORDER — AIRSUPRA 90-80 MCG/ACT IN AERO
1.0000 | INHALATION_SPRAY | Freq: Four times a day (QID) | RESPIRATORY_TRACT | 5 refills | Status: DC | PRN
  Filled 2023-07-03: qty 10.7, 15d supply, fill #0

## 2023-07-03 MED ORDER — PREDNISONE 10 MG PO TABS
20.0000 mg | ORAL_TABLET | Freq: Every day | ORAL | 1 refills | Status: DC
  Filled 2023-07-03: qty 60, 30d supply, fill #0
  Filled 2023-08-04: qty 60, 30d supply, fill #1

## 2023-07-03 MED ORDER — ALBUTEROL SULFATE HFA 108 (90 BASE) MCG/ACT IN AERS
1.0000 | INHALATION_SPRAY | Freq: Four times a day (QID) | RESPIRATORY_TRACT | 5 refills | Status: AC | PRN
  Filled 2023-07-03: qty 6.7, 30d supply, fill #0
  Filled 2023-08-04: qty 6.7, 30d supply, fill #1
  Filled 2023-10-03: qty 6.7, 30d supply, fill #2
  Filled 2023-11-25: qty 6.7, 30d supply, fill #3
  Filled 2024-01-09: qty 6.7, 25d supply, fill #3
  Filled 2024-05-06: qty 6.7, 25d supply, fill #4

## 2023-07-03 NOTE — Telephone Encounter (Signed)
Pt is asking for his prednisone and inhaler to be refilled

## 2023-07-03 NOTE — Telephone Encounter (Signed)
Patient advised that medications have been sent to the [facility] pharmacy. Nothing further needed.

## 2023-07-03 NOTE — Telephone Encounter (Signed)
[facility] is not on our formulary for free meds, per pharmacist. Albuterol inhaler will be given to patient. Approved by Dr. [name]. Nothing further needed.

## 2023-07-16 ENCOUNTER — Other Ambulatory Visit: Payer: Self-pay

## 2023-08-04 ENCOUNTER — Other Ambulatory Visit: Payer: Self-pay

## 2023-08-07 ENCOUNTER — Ambulatory Visit: Payer: Medicaid Other | Admitting: Pulmonary Disease

## 2023-08-07 ENCOUNTER — Encounter: Payer: Self-pay | Admitting: Pulmonary Disease

## 2023-08-08 ENCOUNTER — Other Ambulatory Visit: Payer: Self-pay

## 2023-08-13 ENCOUNTER — Other Ambulatory Visit: Payer: Self-pay

## 2023-08-13 ENCOUNTER — Ambulatory Visit (INDEPENDENT_AMBULATORY_CARE_PROVIDER_SITE_OTHER): Payer: Self-pay | Admitting: Pulmonary Disease

## 2023-08-13 ENCOUNTER — Encounter: Payer: Self-pay | Admitting: Pulmonary Disease

## 2023-08-13 ENCOUNTER — Telehealth: Payer: Self-pay | Admitting: Pharmacist

## 2023-08-13 ENCOUNTER — Telehealth: Payer: Self-pay

## 2023-08-13 VITALS — BP 110/82 | HR 78 | Temp 97.8°F | Ht 71.0 in | Wt 167.0 lb

## 2023-08-13 DIAGNOSIS — J4550 Severe persistent asthma, uncomplicated: Secondary | ICD-10-CM

## 2023-08-13 DIAGNOSIS — Z87891 Personal history of nicotine dependence: Secondary | ICD-10-CM

## 2023-08-13 DIAGNOSIS — J8283 Eosinophilic asthma: Secondary | ICD-10-CM

## 2023-08-13 DIAGNOSIS — J3089 Other allergic rhinitis: Secondary | ICD-10-CM

## 2023-08-13 DIAGNOSIS — B4481 Allergic bronchopulmonary aspergillosis: Secondary | ICD-10-CM

## 2023-08-13 DIAGNOSIS — Z7952 Long term (current) use of systemic steroids: Secondary | ICD-10-CM

## 2023-08-13 LAB — NITRIC OXIDE: Nitric Oxide: 27

## 2023-08-13 MED ORDER — PREDNISONE 10 MG PO TABS
20.0000 mg | ORAL_TABLET | Freq: Every day | ORAL | 2 refills | Status: DC
  Filled 2023-08-13 – 2023-09-04 (×2): qty 60, 30d supply, fill #0
  Filled 2023-10-03: qty 60, 30d supply, fill #1
  Filled 2023-11-03 (×2): qty 60, 30d supply, fill #2

## 2023-08-13 MED ORDER — DUPIXENT 300 MG/2ML ~~LOC~~ SOAJ
300.0000 mg | SUBCUTANEOUS | 1 refills | Status: DC
Start: 2023-08-13 — End: 2023-12-16

## 2023-08-13 MED ORDER — LORATADINE 10 MG PO TABS: 10.0000 mg | ORAL_TABLET | Freq: Every day | ORAL | 6 refills | Status: AC

## 2023-08-13 MED ORDER — ALBUTEROL SULFATE (2.5 MG/3ML) 0.083% IN NEBU
2.5000 mg | INHALATION_SOLUTION | RESPIRATORY_TRACT | 2 refills | Status: AC | PRN
  Filled 2023-08-13: qty 90, 5d supply, fill #0

## 2023-08-13 NOTE — Progress Notes (Deleted)
 Subjective:    Patient ID: Shannon Knapp, male    DOB: March 08, 1986, 38 y.o.   MRN: [id number]  Patient Care Team: [facility], [facility] as PCP - General (General Practice) Salena Saner, MD as Consulting Physician (Pulmonary Disease)  Chief Complaint  Patient presents with  . Follow-up    Cough. No shortness of breath or wheezing.     BACKGROUND/INTERVAL:  HPI    Review of Systems A 10 point review of systems was performed and it is as noted above otherwise negative.   Patient Active Problem List   Diagnosis Date Noted  . Weight loss   . Rhinovirus infection   . Nausea vomiting and diarrhea   . Asthma in adult, severe persistent, with acute exacerbation 08/29/2020  . Viral gastroenteritis 08/29/2020  . Acute asthma exacerbation 07/07/2020  . COVID-19 virus infection 07/07/2020  . Allergic reaction 07/07/2020  . Anaphylaxis after allergen immunotherapy 07/07/2020  . GERD (gastroesophageal reflux disease) 05/03/2020  . 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 Community acquired pneumonia of left lower lobe of lung 10/25/2019  . Medication management 04/30/2019  . Healthcare maintenance 11/26/2018  . Severe persistent asthma 05/28/2018  . Acute febrile illness 04/10/2018  . Upper airway cough syndrome   . Hypoxemia   . Syncope   . Allergic rhinitis   . Cough   . Hemoptysis   . Protein-calorie malnutrition, severe 10/20/2017  . Acute respiratory failure with hypoxemia (HCC) 10/20/2017  . Acute respiratory distress   . Asthma exacerbation   . Status asthmaticus 10/18/2017  . Severe persistent acute asthmatic bronchitis 10/17/2017  . Elevated blood pressure reading without diagnosis of hypertension 10/17/2017  . Respiratory distress 10/17/2017    Social History   Tobacco Use  . Smoking status: Former    Current packs/day: 0.00    Average packs/day: 1 pack/day for  14.2 years (14.2 ttl pk-yrs)    Types: Cigarettes    Start date: 2006    Quit date: 08/2018    Years since quitting: 4.9  . Smokeless tobacco: Never  Substance Use Topics  . Alcohol use: Not Currently    Alcohol/week: 15.0 standard drinks of alcohol    Types: 15 Cans of beer per week    Allergies  Allergen Reactions  . Amoxicillin Hives    Hives all over body. 2009-[facility] in [place], [place]. Has patient had a PCN reaction causing immediate rash, facial/tongue/throat swelling, SOB or lightheadedness with hypotension: Yes Has patient had a PCN reaction causing severe rash involving mucus membranes or skin necrosis: No Has patient had a PCN reaction that required hospitalization: No Has patient had a PCN reaction occurring within the last 10 years: Yes If all of the above answers are "NO", then may proceed with Cephalosporin use.  . Omalizumab Anaphylaxis    Unclear if this was due to omalizumab as the patient had COVID-19 at the time.  Complaint was that of sore throat, tight throat.  . Doxycycline   . Fish Allergy Swelling  . Shellfish Allergy Swelling  . Penicillins Hives    Has patient had a PCN reaction causing immediate rash, facial/tongue/throat swelling, SOB or lightheadedness with hypotension: Yes Has patient had a PCN reaction causing severe rash involving mucus membranes or skin necrosis: No Has patient had a PCN reaction that required hospitalization: No Has patient had a PCN reaction occurring within the last 10 years: Yes If all of the above  answers are "NO", then may proceed with Cephalosporin use.    Current Meds  Medication Sig  . albuterol (PROVENTIL) (2.5 MG/3ML) 0.083% nebulizer solution Take 3 mLs (2.5 mg total) by nebulization every 4 (four) hours as needed for wheezing or shortness of breath.  Marland Kitchen albuterol (VENTOLIN HFA) 108 (90 Base) MCG/ACT inhaler Inhale 1-2 puffs into the lungs every 6 (six) hours as needed for shortness of breath.  . Dupilumab  (DUPIXENT) 300 MG/2ML SOAJ Inject 300 mg into the skin every 14 (fourteen) days.  Marland Kitchen EPINEPHrine (EPIPEN 2-PAK) 0.3 mg/0.3 mL IJ SOAJ injection Inject 0.3mg  into the muscle as needed for anaphylaxis  . fluticasone furoate-vilanterol (BREO ELLIPTA) 200-25 MCG/ACT AEPB Inhale 1 puff into the lungs daily.  Marland Kitchen loratadine (CLARITIN) 10 MG tablet Take 1 tablet (10 mg total) by mouth daily.  . Nebulizers (COMPRESSOR/NEBULIZER) MISC 1 Dose by Does not apply route daily.  . predniSONE (DELTASONE) 10 MG tablet Take 2 tablets (20 mg total) by mouth daily with breakfast.    Immunization History  Administered Date(s) Administered  . Moderna Sars-Covid-2 Vaccination 03/24/2020, 05/01/2020  . Pneumococcal Polysaccharide-23 10/19/2017        Objective:     BP 110/82 (BP Location: Left Arm, Patient Position: Sitting, Cuff Size: Normal)   Pulse 78   Temp 97.8 F (36.6 C) (Temporal)   Ht 5\' 11"  (1.803 m)   Wt 167 lb (75.8 kg)   SpO2 100%   BMI 23.29 kg/m   SpO2: 100 %  GENERAL: HEAD: Normocephalic, atraumatic.  EYES: Pupils equal, round, reactive to light.  No scleral icterus.  MOUTH:  NECK: Supple. No thyromegaly. Trachea midline. No JVD.  No adenopathy. PULMONARY: Good air entry bilaterally.  No adventitious sounds. CARDIOVASCULAR: S1 and S2. Regular rate and rhythm.  ABDOMEN: MUSCULOSKELETAL: No joint deformity, no clubbing, no edema.  NEUROLOGIC:  SKIN: Intact,warm,dry. PSYCH:        Assessment & Plan:   No diagnosis found.  No orders of the defined types were placed in this encounter.   No orders of the defined types were placed in this encounter.      Advised if symptoms do not improve or worsen, to please contact office for sooner follow up or seek emergency care.    I spent xxx minutes of dedicated to the care of this patient on the date of this encounter to include pre-visit review of records, face-to-face time with the patient discussing conditions above, post  visit ordering of testing, clinical documentation with the electronic health record, making appropriate referrals as documented, and communicating necessary findings to members of the patients care team.     C. Danice Goltz, MD Advanced Bronchoscopy PCCM [facility]    *This note was generated using voice recognition software/Dragon and/or AI transcription program.  Despite best efforts to proofread, errors can occur which can change the meaning. Any transcriptional errors that result from this process are unintentional and may not be fully corrected at the time of dictation.

## 2023-08-13 NOTE — Patient Instructions (Signed)
 VISIT SUMMARY:  Shannon Knapp, a 38 year old male with persistent asthma, came in for a follow-up on his asthma management. His current medications include Dupixent, Breo, prednisone, and albuterol. He has been managing his asthma well but occasionally adjusts his prednisone dose based on allergen triggers. He has not been taking Claritin recently, which is part of his prescribed regimen.  YOUR PLAN:  -ASTHMA: Asthma is a condition where your airways narrow and swell, producing extra mucus, which can make breathing difficult. Your asthma is well-managed with your current medications: Dupixent, Breo, and albuterol. You should continue taking Dupixent as prescribed and Breo once daily. Use albuterol as needed. We will continue to taper your prednisone to 10 mg and recommend using Claritin to manage allergy symptoms.  -ALLERGIC RHINITIS: Allergic rhinitis is an allergic reaction that causes sneezing, congestion, and a runny nose. It can contribute to asthma flare-ups. We recommend resuming Claritin to help manage your allergy symptoms and reduce asthma exacerbations.  INSTRUCTIONS:  Please continue with your current medications as prescribed. Use albuterol as needed. Taper your prednisone to 10 mg and resume taking Claritin to manage your allergy symptoms.  He has sent a request for Dupixent refill to our pharmacy team.  Follow-up in 2 months time call sooner should any new problems arise.

## 2023-08-13 NOTE — Progress Notes (Signed)
 Subjective:    Patient ID: Shannon Knapp, male    DOB: 1985-08-26, 38 y.o.   MRN: [id number]  Patient Care Team: Center, [facility] as PCP - General (General Practice) Salena Saner, MD as Consulting Physician (Pulmonary Disease)  Chief Complaint  Patient presents with   Follow-up    Cough. No shortness of breath or wheezing.     BACKGROUND/INTERVAL:38 year old male, former smoker followed for severe persistent asthma, eosinophilic phenotype, steroid-dependent, ABPA, allergic rhinitis.  He was last seen by me on 04 June 2023.  Additional past medical history significant for GERD.At his prior visit on 26 March 2023 he was restarted on Dupixent and on his asthma controller medications.  He presents today for follow-up visit.  HPI Discussed the use of AI scribe software for clinical note transcription with the patient, who gave verbal consent to proceed.  History of Present Illness   Shannon Knapp is a 38 year old male with asthma who presents for follow-up of his asthma management.  He is currently managing his asthma with Dupixent, Breo, and prednisone. Dupixent is administered biweekly, and he has 1 more refill, will need to keep his prescription current. Virgel Bouquet is taken once daily in the morning. He is on 10 mg of prednisone, having reduced from 20 mg, but occasionally adjusts the dose based on allergen exposure levels, sometimes taking 15 mg by splitting a pill. Albuterol is used as a rescue inhaler once a day, with two puffs in the morning, and he does not find the need to use it again throughout the day. He has not been taking Claritin recently, although it is part of his prescribed regimen.  He works in an environment where he is exposed to cold places [employer]), which may impact his asthma.  He has not had any recent fevers, chills or sweats.  No cough, no sputum production.  Overall feels that he is well-controlled with his current asthma  regimen.    Review of Systems A 10 point review of systems was performed and it is as noted above otherwise negative.   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/07/2020   Community acquired pneumonia of left lower lobe of lung 10/25/2019   Medication management 04/30/2019   Healthcare maintenance 11/26/2018   Severe persistent asthma 05/28/2018   Acute febrile illness 04/10/2018   Upper airway cough syndrome    Hypoxemia    Syncope    Allergic rhinitis    Cough    Hemoptysis    Protein-calorie malnutrition, severe 10/20/2017   Acute respiratory failure with hypoxemia (HCC) 10/20/2017   Acute respiratory distress    Asthma exacerbation    Status asthmaticus 10/18/2017   Severe persistent acute asthmatic bronchitis 10/17/2017   Elevated blood pressure reading without diagnosis of hypertension 10/17/2017   Respiratory distress 10/17/2017    Social History   Tobacco Use   Smoking status: Former    Current packs/day: 0.00    Average packs/day: 1 pack/day for 14.2 years (14.2 ttl  pk-yrs)    Types: Cigarettes    Start date: 2006    Quit date: 08/2018    Years since quitting: 4.9   Smokeless tobacco: Never  Substance Use Topics   Alcohol use: Not Currently    Alcohol/week: 15.0 standard drinks of alcohol    Types: 15 Cans of beer per week    Allergies  Allergen Reactions   Amoxicillin Hives    Hives all over body. 2009-[facility] in [place], [place]. Has patient had a  PCN reaction causing immediate rash, facial/tongue/throat swelling, SOB or lightheadedness with hypotension: Yes Has patient had a PCN reaction causing severe rash involving mucus membranes or skin necrosis: No Has patient had a PCN reaction that required hospitalization: No Has patient had a PCN reaction occurring within the last 10 years: Yes If all of the above answers are "NO", then may proceed with Cephalosporin use.   Omalizumab Anaphylaxis    Unclear if this was due to omalizumab as the patient had COVID-19 at the time.  Complaint was that of sore throat, tight throat.   Doxycycline    Fish Allergy Swelling   Shellfish Allergy Swelling   Penicillins Hives    Has patient had a PCN reaction causing immediate rash, facial/tongue/throat swelling, SOB or lightheadedness with hypotension: Yes Has patient had a PCN reaction causing severe rash involving mucus membranes or skin necrosis: No Has patient had a PCN reaction that required hospitalization: No Has patient had a PCN reaction occurring within the last 10 years: Yes If all of the above answers are "NO", then may proceed with Cephalosporin use.    Current Meds  Medication Sig   albuterol (VENTOLIN HFA) 108 (90 Base) MCG/ACT inhaler Inhale 1-2 puffs into the lungs every 6 (six) hours as needed for shortness of breath.   EPINEPHrine (EPIPEN 2-PAK) 0.3 mg/0.3 mL IJ SOAJ injection Inject 0.3mg  into the muscle as needed for anaphylaxis   fluticasone furoate-vilanterol (BREO ELLIPTA) 200-25 MCG/ACT AEPB Inhale 1 puff into the lungs daily.   Nebulizers (COMPRESSOR/NEBULIZER) MISC 1 Dose by Does not apply route daily.   [DISCONTINUED] albuterol (PROVENTIL) (2.5 MG/3ML) 0.083% nebulizer solution Take 3 mLs (2.5 mg total) by nebulization every 4 (four) hours as needed for wheezing or shortness of breath.   [DISCONTINUED] Dupilumab (DUPIXENT) 300 MG/2ML SOAJ Inject 300 mg into the skin every 14 (fourteen) days.   [DISCONTINUED] loratadine  (CLARITIN) 10 MG tablet Take 1 tablet (10 mg total) by mouth daily.   [DISCONTINUED] predniSONE (DELTASONE) 10 MG tablet Take 2 tablets (20 mg total) by mouth daily with breakfast.    Immunization History  Administered Date(s) Administered   Moderna Sars-Covid-2 Vaccination 03/24/2020, 05/01/2020   Pneumococcal Polysaccharide-23 10/19/2017        Objective:     BP 110/82 (BP Location: Left Arm, Patient Position: Sitting, Cuff Size: Normal)   Pulse 78   Temp 97.8 F (36.6 C) (Temporal)   Ht 5\' 11"  (1.803 m)   Wt 167 lb (75.8 kg)   SpO2 100%   BMI 23.29 kg/m   SpO2: 100 %  GENERAL: Well-developed, well-nourished gentleman in no acute distress.  No conversational dyspnea.  Looks fit.  Fully ambulatory. HEAD: Normocephalic, atraumatic.  EYES: Pupils equal, round, reactive to light.  No scleral icterus.  MOUTH: Poor dentition, oral mucosa moist.  No thrush. NECK: Supple. No thyromegaly. Trachea midline. No JVD.  No adenopathy. PULMONARY: Symmetrical air entry, no adventitious sounds.  No prolonged expiratory phase.  CARDIOVASCULAR: S1 and S2.  Regular rate and rhythm.  No rubs, murmurs or gallops heard. ABDOMEN: Benign. MUSCULOSKELETAL: No joint deformity, no clubbing, no edema.  NEUROLOGIC: No overt focal deficit. SKIN: Intact,warm,dry. PSYCH: Mood and behavior normal.  Lab Results  Component Value Date   NITRICOXIDE 27 08/13/2023  *trend 191>>32>>27    Assessment & Plan:     ICD-10-CM   1. Severe persistent asthma dependent on systemic steroids  J45.50 Nitric oxide   Z79.52     2. Perennial allergic rhinitis  J30.89 predniSONE (DELTASONE) 10 MG tablet    3. ABPA (allergic bronchopulmonary aspergillosis) (HCC)  B44.81    Previously treated with steroids/itraconazole      Orders Placed This Encounter  Procedures   Nitric oxide    Meds ordered this encounter  Medications   predniSONE (DELTASONE) 10 MG tablet    Sig: Take 2 tablets (20 mg total) by mouth  daily with breakfast. Or as directed    Dispense:  60 tablet    Refill:  2   albuterol (PROVENTIL) (2.5 MG/3ML) 0.083% nebulizer solution    Sig: Take 3 mLs (2.5 mg total) by nebulization every 4 (four) hours as needed for wheezing or shortness of breath.    Dispense:  90 mL    Refill:  2    J45.50 dx   loratadine (CLARITIN) 10 MG tablet    Sig: Take 1 tablet (10 mg total) by mouth daily.    Dispense:  30 tablet    Refill:  6   Discussion:    Severe Persistent Asthma, Eosinophilic Phenotype, Steroid-Dependent Asthma is well-managed with Dupixent, Breo, and albuterol. Albuterol usage is once daily, consistent with expected rescue inhaler use. Reduced nitric oxide levels indicate good control. Prednisone is at 10 mg, with plans for further tapering. Claritin is recommended to manage allergy symptoms and reduce prednisone dependency.  Ideally he should be on AirSupra as rescue however, not covered by his current insurance coverage. - Continue Dupixent as prescribed. - Continue Breo once daily. - Use albuterol as needed. - Taper prednisone to 10 mg and use Claritin to manage allergy symptoms.  Allergic Rhinitis Allergic rhinitis contributes to asthma exacerbations. Regular use of Claritin may aid in reducing prednisone need and managing symptoms. - Resume Claritin to manage allergy symptoms and reduce asthma exacerbations.     Advised if symptoms do not improve or worsen, to please contact office for sooner follow up or seek emergency care.    I spent 31 minutes of dedicated to the care of this patient on the date of this encounter to include pre-visit review of records, face-to-face time with the patient discussing conditions above, post visit ordering of testing, clinical documentation with the electronic health record, making appropriate referrals as documented, and communicating necessary findings to members of the patients care team.    C. Danice Goltz, MD Advanced  Bronchoscopy PCCM [facility]    *This note was generated using voice recognition software/Dragon and/or AI transcription program.  Despite best efforts to proofread, errors can occur which can change the meaning. Any transcriptional errors that result from this process are unintentional and may not be fully corrected at the time of dictation.

## 2023-08-13 NOTE — Telephone Encounter (Signed)
 Patient due for [employer] PAP renewal in July 2025 (current enrollment expires on 12/03/23). Patient form mailed to patient address on file. Provider form faxed to [facility] for signature by Dr. Dollene [name], PharmD, MPH, BCPS, CPP Clinical Pharmacist (Rheumatology and Pulmonology)

## 2023-08-13 NOTE — Telephone Encounter (Signed)
 Patient was seen in the office today. He said his Dupixent needs a renewal. Pharmacy team can you please look into this. Thank you!

## 2023-08-13 NOTE — Telephone Encounter (Signed)
 Patient is approved for Dupixent pt assistance through 12/03/23. He needs to call [facility] pharmacy to schedule any refills Pharmacy phone: [phone]   We will mail renewal application to address on file today  Chesley Mires, PharmD, MPH, BCPS, CPP Clinical Pharmacist (Rheumatology and Pulmonology)

## 2023-08-13 NOTE — Telephone Encounter (Signed)
 Unable to leave message. VM either full or unavailable. Will try again later.  If patient calls back, [facility] may give results

## 2023-08-14 NOTE — Telephone Encounter (Signed)
 Received signed provider form from Dr. [name] via [employer]. This is retained in Onbase for submission once patient returns his portion

## 2023-08-14 NOTE — Telephone Encounter (Signed)
 I have notified the patient. Nothing further needed.

## 2023-08-25 ENCOUNTER — Other Ambulatory Visit: Payer: Self-pay

## 2023-09-01 ENCOUNTER — Other Ambulatory Visit: Payer: Self-pay

## 2023-09-01 ENCOUNTER — Other Ambulatory Visit: Payer: Self-pay | Admitting: Pulmonary Disease

## 2023-09-01 DIAGNOSIS — J4550 Severe persistent asthma, uncomplicated: Secondary | ICD-10-CM

## 2023-09-01 DIAGNOSIS — J4551 Severe persistent asthma with (acute) exacerbation: Secondary | ICD-10-CM

## 2023-09-01 MED ORDER — MONTELUKAST SODIUM 10 MG PO TABS
10.0000 mg | ORAL_TABLET | Freq: Every day | ORAL | 2 refills | Status: DC
Start: 2023-09-01 — End: 2023-12-08
  Filled 2023-09-01: qty 30, 30d supply, fill #0
  Filled 2023-10-03: qty 30, 30d supply, fill #1
  Filled 2023-11-03 (×2): qty 30, 30d supply, fill #2

## 2023-09-01 NOTE — Addendum Note (Signed)
 Addended by: Boris Byars H on: 09/01/2023 02:00 PM   Modules accepted: Orders

## 2023-09-04 ENCOUNTER — Other Ambulatory Visit: Payer: Self-pay

## 2023-09-12 ENCOUNTER — Telehealth: Payer: Self-pay

## 2023-09-12 NOTE — Telephone Encounter (Signed)
NA. Voicemail is full.

## 2023-09-12 NOTE — Telephone Encounter (Signed)
 [facility]  Has been unsuccessful contacting patient to schedule DUPIXENT  refill.   NA. Voicemail full.   Patient will need to call [facility] [phone].

## 2023-09-15 NOTE — Telephone Encounter (Signed)
 I spoke with the patient. He said he has spoke to the pharmacy and they will be delivering the Dupixent  no later then Tues.  Nothing further needed.

## 2023-09-24 NOTE — Telephone Encounter (Signed)
 Patient has appt on 10/16/23 @ [facility] office. Note left on visit notes for DPX paperwork to completed at [facility]

## 2023-10-03 ENCOUNTER — Other Ambulatory Visit: Payer: Self-pay

## 2023-10-16 ENCOUNTER — Encounter: Payer: Self-pay | Admitting: Pulmonary Disease

## 2023-10-16 ENCOUNTER — Ambulatory Visit: Payer: Self-pay | Admitting: Pulmonary Disease

## 2023-10-16 VITALS — BP 118/76 | HR 82 | Ht 71.0 in | Wt 158.2 lb

## 2023-10-16 DIAGNOSIS — J3089 Other allergic rhinitis: Secondary | ICD-10-CM

## 2023-10-16 DIAGNOSIS — J4550 Severe persistent asthma, uncomplicated: Secondary | ICD-10-CM

## 2023-10-16 DIAGNOSIS — Z7952 Long term (current) use of systemic steroids: Secondary | ICD-10-CM

## 2023-10-16 LAB — NITRIC OXIDE: Nitric Oxide: 65

## 2023-10-16 MED ORDER — TRELEGY ELLIPTA 200-62.5-25 MCG/ACT IN AEPB: 1.0000 | INHALATION_SPRAY | Freq: Every day | RESPIRATORY_TRACT | Status: DC

## 2023-10-16 NOTE — Patient Instructions (Signed)
 VISIT SUMMARY:  You came in today for a follow-up visit regarding your severe persistent asthma. You mentioned that your breathing improves when you are at the beach, but your allergy symptoms return when you come back home. You are currently using Breo once a day and albuterol  as a rescue inhaler once a day. No wheezing was noted during the visit, but there is some increased airway inflammation likely due to recent weather changes.  YOUR PLAN:  -SEVERE PERSISTENT ASTHMA: Severe persistent asthma is a long-term condition where your airways are always inflamed, making it hard to breathe. Your symptoms seem to improve at the beach, likely due to environmental factors. We will provide you with samples of Trelegy to see if it offers better control of your asthma symptoms. Please set aside Breo while you are trying Trelegy. If Trelegy works well for you, we will consider getting pre-authorization for it.  Trelegy is 1 puff daily, make sure you rinse your mouth well after you use it.  INSTRUCTIONS:  Please schedule a follow-up appointment in two months to monitor your asthma control.

## 2023-10-16 NOTE — Progress Notes (Signed)
 Subjective:    Patient ID: Shannon Knapp, male    DOB: 1985/07/31, 38 y.o.   MRN: [id number]  Patient Care Team: Center, [facility] as PCP - General (General Practice) Marc Senior, MD as Consulting Physician (Pulmonary Disease)  Chief Complaint  Patient presents with   Follow-up    Breathing is overall doing well.     BACKGROUND/INTERVAL:38 year old male, former smoker followed for severe persistent asthma, eosinophilic phenotype, steroid-dependent, ABPA, allergic rhinitis.  He was last seen by me on 13 August 2023.  Additional past medical history significant for GERD.he has been on Dupixent  and asthma controller medications.  He presents today for follow-up visit.   HPI Discussed the use of AI scribe software for clinical note transcription with the patient, who gave verbal consent to proceed.  History of Present Illness   Shannon Knapp is a 38 year old male with severe persistent asthma who presents for follow-up.  He experiences improved breathing while at the beach, specifically in [address], [place], attributing the improvement to the beach air. However, upon returning home, he experiences a resurgence of allergy symptoms, which he associates with the change in weather conditions.  He is currently receiving Dupixent  injections every two weeks on Sundays and uses Breo once a day. He recalls trying Trelegy in the past but switched back to [facility]. He uses albuterol  once a day as a rescue inhaler.  No overt wheezing was noted during the visit.   Overall he feels well and looks well.    Review of Systems A 10 point review of systems was performed and it is as noted above otherwise negative.   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/07/2020   Community acquired pneumonia of left lower lobe of lung 10/25/2019   Medication management 04/30/2019   Healthcare maintenance 11/26/2018   Severe persistent asthma 05/28/2018   Acute febrile illness 04/10/2018   Upper airway cough syndrome    Hypoxemia    Syncope    Allergic rhinitis    Cough    Hemoptysis    Protein-calorie malnutrition, severe 10/20/2017   Acute respiratory failure with hypoxemia (HCC) 10/20/2017   Acute respiratory distress    Asthma exacerbation    Status asthmaticus 10/18/2017   Severe persistent acute asthmatic bronchitis 10/17/2017   Elevated blood pressure reading without diagnosis of hypertension 10/17/2017   Respiratory distress 10/17/2017    Social History   Tobacco Use   Smoking status: Former    Current packs/day: 0.00    Average packs/day: 1 pack/day for 14.2 years (14.2 ttl pk-yrs)    Types: Cigarettes    Start date: 2006    Quit date: 08/2018    Years since quitting: 5.1   Smokeless tobacco: Never  Substance Use Topics   Alcohol use: Not Currently    Alcohol/week: 15.0 standard drinks of alcohol    Types: 15 Cans of beer per week    Allergies  Allergen Reactions   Amoxicillin Hives    Hives  all over body. 2009-[facility] in [place], [place]. Has patient had a PCN reaction causing immediate rash, facial/tongue/throat swelling, SOB or lightheadedness with hypotension: Yes Has patient had a PCN reaction causing severe rash involving mucus membranes or skin necrosis: No Has patient had a PCN reaction that required hospitalization: No Has patient had a PCN reaction occurring within the last 10 years: Yes If all of the above answers  are "NO", then may proceed with Cephalosporin use.   Omalizumab  Anaphylaxis    Unclear if this was due to omalizumab  as the patient had COVID-19 at the time.  Complaint was that of sore throat, tight throat.   Doxycycline     Fish Allergy Swelling   Shellfish Allergy Swelling   Penicillins Hives    Has patient had a PCN reaction causing immediate rash, facial/tongue/throat swelling, SOB or lightheadedness with hypotension: Yes Has patient had a PCN reaction causing severe rash involving mucus membranes or skin necrosis: No Has patient had a PCN reaction that required hospitalization: No Has patient had a PCN reaction occurring within the last 10 years: Yes If all of the above answers are "NO", then may proceed with Cephalosporin use.    Current Meds  Medication Sig   albuterol  (PROVENTIL ) (2.5 MG/3ML) 0.083% nebulizer solution Take 3 mLs (2.5 mg total) by nebulization every 4 (four) hours as needed for wheezing or shortness of breath.   albuterol  (VENTOLIN  HFA) 108 (90 Base) MCG/ACT inhaler Inhale 1-2 puffs into the lungs every 6 (six) hours as needed for shortness of breath.   Dupilumab  (DUPIXENT ) 300 MG/2ML SOAJ Inject 300 mg into the skin every 14 (fourteen) days.   EPINEPHrine  (EPIPEN  2-PAK) 0.3 mg/0.3 mL IJ SOAJ injection Inject 0.3mg  into the muscle as needed for anaphylaxis   fluticasone  furoate-vilanterol (BREO ELLIPTA ) 200-25 MCG/ACT AEPB Inhale 1 puff into the lungs daily.   loratadine  (CLARITIN ) 10 MG tablet Take 1 tablet (10 mg total) by mouth daily.   montelukast  (SINGULAIR ) 10 MG tablet Take 1 tablet (10 mg total) by mouth daily.   Nebulizers (COMPRESSOR/NEBULIZER) MISC 1 Dose by Does not apply route daily.   predniSONE  (DELTASONE ) 10 MG tablet Take 2 tablets (20 mg total) by mouth daily with breakfast. Or as directed (Patient taking differently: Take 10 mg by mouth daily with breakfast. Or as directed)    Immunization History  Administered Date(s) Administered   Moderna  Sars-Covid-2 Vaccination 03/24/2020, 05/01/2020   Pneumococcal Polysaccharide-23 10/19/2017        Objective:     BP 118/76 (BP Location: Right Arm, Cuff Size: Normal)   Pulse 82   Ht 5\' 11"  (1.803 m)   Wt 158 lb 3.2 oz (71.8 kg)   SpO2 98%   BMI 22.06 kg/m   SpO2: 98 % O2 Device: None (Room air)  GENERAL: Well-developed, well-nourished gentleman in no acute distress.  No conversational dyspnea.  Looks fit.  Fully ambulatory. HEAD: Normocephalic, atraumatic.  EYES: Pupils equal, round, reactive to light.  No scleral icterus.  MOUTH: Poor dentition, oral mucosa moist.  No thrush. NECK: Supple. No thyromegaly. Trachea midline. No JVD.  No adenopathy. PULMONARY: Symmetrical air entry, no adventitious sounds.  No prolonged expiratory phase.  CARDIOVASCULAR: S1 and S2.  Regular rate and rhythm.  No rubs, murmurs or gallops heard. ABDOMEN: Benign. MUSCULOSKELETAL: No joint deformity, no clubbing, no edema.  NEUROLOGIC: No overt focal deficit. SKIN: Intact,warm,dry. PSYCH: Mood and behavior normal.  Lab Results  Component Value Date   NITRICOXIDE 65 10/16/2023  *Trend:191>>32>>27>>65 *  Evidence of type II inflammation present   Assessment & Plan:     ICD-10-CM   1. Severe persistent asthma dependent on systemic steroids  J45.50 Nitric oxide    Z79.52     2. Perennial allergic rhinitis  J30.89       Orders Placed This Encounter  Procedures   Nitric oxide    Discussion:    Severe persistent asthma Severe persistent asthma with increased airway inflammation, likely due to recent weather changes. Reports improved breathing at the beach, suggesting environmental factors influence symptom control. Currently using Breo once daily and albuterol  as a rescue inhaler once daily. No overt wheezing on examination, but inflammation is slightly elevated. - Provide samples of Trelegy for trial to assess better control of asthma symptoms. - Instruct to set aside Breo while trying  Trelegy. - Consider pre-authorization for Trelegy if it proves effective. - Schedule follow-up appointment in two months to monitor asthma control.      Advised if symptoms do not improve or worsen, to please contact office for sooner follow up or seek emergency care.    I spent 32 minutes of dedicated to the care of this patient on the date of this encounter to include pre-visit review of records, face-to-face time with the patient discussing conditions above, post visit ordering of testing, clinical documentation with the electronic health record, making appropriate referrals as documented, and communicating necessary findings to members of the patients care team.     C. Chloe Counter, MD Advanced Bronchoscopy PCCM [facility]    *This note was generated using voice recognition software/Dragon and/or AI transcription program.  Despite best efforts to proofread, errors can occur which can change the meaning. Any transcriptional errors that result from this process are unintentional and may not be fully corrected at the time of dictation.

## 2023-10-27 ENCOUNTER — Encounter: Payer: Self-pay | Admitting: Pulmonary Disease

## 2023-11-03 ENCOUNTER — Other Ambulatory Visit: Payer: Self-pay

## 2023-11-10 ENCOUNTER — Other Ambulatory Visit: Payer: Self-pay

## 2023-11-25 ENCOUNTER — Other Ambulatory Visit: Payer: Self-pay | Admitting: Nurse Practitioner

## 2023-11-25 ENCOUNTER — Other Ambulatory Visit: Payer: Self-pay

## 2023-11-25 ENCOUNTER — Telehealth: Payer: Self-pay

## 2023-11-25 MED ORDER — TRELEGY ELLIPTA 200-62.5-25 MCG/ACT IN AEPB: 1.0000 | INHALATION_SPRAY | Freq: Every day | RESPIRATORY_TRACT | 0 refills | Status: DC

## 2023-11-25 NOTE — Telephone Encounter (Addendum)
 Per Dr. Tamea- okay to give him samples of the Trelegy. He will need to contact his insurance and find out what alternative they will pay for and let us  know.  Patient came into the office today. I have relayed the message and gave him 2 samples of the trelegy. He did ask about his Albuterol  inhaler.   I called the pharmacy and was told it is ready for him to pick up for $30. Also that if he will bring in his tax info they will try to enroll him in the [employer] assistance program.  I have notified the patient.  Nothing further needed.

## 2023-11-25 NOTE — Telephone Encounter (Signed)
 Copied from CRM [id number]. Topic: Clinical - Prescription Issue >> Nov 25, 2023 11:35 AM Russell PARAS wrote: Reason for CRM:   Pt is contacting clinic regarding several prescription inhalers. He is wanting to know if clinic has samples avail for the below medications:  albuterol  (VENTOLIN  HFA) 108 (90 Base) MCG/ACT inhaler fluticasone  furoate-vilanterol (BREO ELLIPTA ) 200-25 MCG/ACT AEPB Fluticasone -Umeclidin-Vilant (TRELEGY ELLIPTA ) 200-62.5-25 MCG/ACT AEPB  He is completely out of the inhalers, and has been advised by the program he typically gets these medications through, that they are no longer covered.  CB#  [phone]

## 2023-11-27 ENCOUNTER — Other Ambulatory Visit: Payer: Self-pay

## 2023-12-08 ENCOUNTER — Other Ambulatory Visit: Payer: Self-pay

## 2023-12-08 ENCOUNTER — Other Ambulatory Visit: Payer: Self-pay | Admitting: Pulmonary Disease

## 2023-12-08 DIAGNOSIS — J3089 Other allergic rhinitis: Secondary | ICD-10-CM

## 2023-12-08 MED ORDER — PREDNISONE 10 MG PO TABS
20.0000 mg | ORAL_TABLET | Freq: Every day | ORAL | 2 refills | Status: DC
  Filled 2023-12-08: qty 60, 30d supply, fill #0
  Filled 2024-01-05: qty 60, 30d supply, fill #1
  Filled 2024-02-11: qty 60, 30d supply, fill #2

## 2023-12-08 MED ORDER — MONTELUKAST SODIUM 10 MG PO TABS
10.0000 mg | ORAL_TABLET | Freq: Every day | ORAL | 2 refills | Status: AC
  Filled 2023-12-08: qty 30, 30d supply, fill #0
  Filled 2024-01-05: qty 30, 30d supply, fill #1
  Filled 2024-02-11: qty 30, 30d supply, fill #2

## 2023-12-08 NOTE — Telephone Encounter (Signed)
 Copied from CRM [id number]. Topic: Clinical - Medication Refill >> Dec 08, 2023 11:00 AM Corean SAUNDERS wrote: Medication: montelukast  (SINGULAIR ) 10 MG tablet // predniSONE  (DELTASONE ) 10 MG table  Has the patient contacted their pharmacy? Yes, patient states he has already been waiting for these medications to be refilled.  (Agent: If no, request that the patient contact the pharmacy for the refill. If patient does not wish to contact the pharmacy document the reason why and proceed with request.) (Agent: If yes, when and what did the pharmacy advise?)  This is the patient's preferred pharmacy:  [facility] [address] [place] [address] Phone: [phone] Fax: [phone]     Is this the correct pharmacy for this prescription? Yes If no, delete pharmacy and type the correct one.   Has the prescription been filled recently? Yes  Is the patient out of the medication? Yes  Has the patient been seen for an appointment in the last year OR does the patient have an upcoming appointment? Yes  Can we respond through MyChart? No  Agent: Please be advised that Rx refills may take up to 3 business days. We ask that you follow-up with your pharmacy.

## 2023-12-11 ENCOUNTER — Telehealth: Payer: Self-pay | Admitting: Pharmacist

## 2023-12-11 ENCOUNTER — Telehealth: Payer: Self-pay

## 2023-12-11 DIAGNOSIS — J4550 Severe persistent asthma, uncomplicated: Secondary | ICD-10-CM

## 2023-12-11 NOTE — Telephone Encounter (Signed)
 Patient picked up Dupixent  sample. He plans to bring pay stubs to office tomorrow since he will be in [place] again.  Sherry Pennant, PharmD, MPH, BCPS, CPP Clinical Pharmacist (Rheumatology and Pulmonology)

## 2023-12-11 NOTE — Telephone Encounter (Signed)
 Copied from CRM [id number]. Topic: Clinical - Prescription Issue >> Dec 11, 2023  9:13 AM Isabell A wrote: Reason for CRM: Patient states he has moved and didn't received his last Dupixent  shot - he is asking if there are any samples to get him back on schedule until the new paperwork is completed to get him enrolled. Patient states he is due for his shot today.   Callback number: [phone]

## 2023-12-11 NOTE — Telephone Encounter (Signed)
 I spoke with the patient. He will go to the [place] office today and pick up samples.  Nothing further needed.

## 2023-12-11 NOTE — Telephone Encounter (Addendum)
 Received message from [place], [place] at [place]. Patient requesting DPX sample. He is out of medication and enrollment has expired. We had mailed application to him in March however I have re-printed patient form. Provider form still retained in Onbase  Sample at room temperature on my desk in office (stable at room temperature until 11/24/2023 @1300   Dupixent  300mg /17mL pen x 1 pen NDC: [address]-98 Lot: [id number] Exp: 05/19/2025  Sherry Pennant, PharmD, MPH, BCPS, CPP Clinical Pharmacist (Rheumatology and Pulmonology)

## 2023-12-11 NOTE — Telephone Encounter (Signed)
 Submitted Patient Assistance Application to [employer] for DUPIXENT  along with provider portion, patient portion,  medication list. Will update patient when we receive a response.  He plans to drop off pay stubs tomorrow when he is back in [place]  He was provided sample today in office (1 pen)  Phone #: [phone] Fax #: [phone]  Of note, he just started a full time job and amy be getting insurance through employer in a few weeks to months. He has been advised to notify our office if he does acquire insurance through employer  Sherry Pennant, PharmD, MPH, BCPS, CPP Clinical Pharmacist (Rheumatology and Pulmonology)

## 2023-12-12 NOTE — Telephone Encounter (Signed)
 Copied from CRM [id number]. Topic: General - Other >> Dec 12, 2023  9:57 AM Rilla NOVAK wrote: Reason for CRM:  Felicia from [employer]..  Enrollment form for Dupixent  that was faxed over on yesterday is missing the following information:   Clarify patient does not have insurance Missing legal representative, so not sure who signed form Missing loading dose on form  FAX:  [phone] Office:  [phone] (M-F 8am-5p EST)  ----------------------------------------------------------------------- From previous Reason for Contact - Prescription Issue: Reason for CRM:  Felicia from [employer]..  Enrollment form for Dupixent  that was faxed over on yesterday is missing the following information:   Clarify patient does not have insurance Missing legal representative, so not sure who signed form Missing loading dose on form  FAX:  [phone] Office:  [phone] (M-F 8am-5p EST)   Please advise

## 2023-12-12 NOTE — Telephone Encounter (Signed)
 Patient does not have insurance as is cleared marked and written on form.  Patient signed form on his own and does not have caregiver. Not sure why they need this at all? He is an independent adult not requiring a caregiver  Patient has been on treatment and does not need loading dose. [employer] has been the one filling rx   Spoke with rep and they have notated  Sherry Pennant, PharmD, MPH, BCPS, CPP Clinical Pharmacist (Rheumatology and Pulmonology)

## 2023-12-16 MED ORDER — DUPIXENT 300 MG/2ML ~~LOC~~ SOAJ
300.0000 mg | SUBCUTANEOUS | 1 refills | Status: AC
Start: 2023-12-16 — End: ?

## 2023-12-16 NOTE — Telephone Encounter (Signed)
 Received fax from [employer] - Received a fax from  [employer] regarding an approval for DUPIXENT  patient assistance from 12/16/2023 to 12/15/2024. Approval letter sent to scan center.  Phone #: [phone] Fax #: [phone]

## 2023-12-16 NOTE — Telephone Encounter (Signed)
 Received fax from [employer] for DUPIXENT  patient assistance, patient's application has been DENIED due to not meeting financial criteria.    Phone #: [phone] Fax #: [phone]  Reference # [id number]  I spoke with patient regarding denial, and he states that his income has not significantly increased. Advised him to reach out to [employer] to provide clarification on income and also notified that [employer] may end up requiring income documents. He verbalized understanding and will f/u with [employer] for next steps to appeal determination  Sherry Pennant, PharmD, MPH, BCPS, CPP Clinical Pharmacist (Rheumatology and Pulmonology)

## 2023-12-18 ENCOUNTER — Other Ambulatory Visit: Payer: Self-pay

## 2023-12-18 ENCOUNTER — Ambulatory Visit: Payer: Self-pay | Admitting: Pulmonary Disease

## 2023-12-18 ENCOUNTER — Encounter: Payer: Self-pay | Admitting: Pulmonary Disease

## 2023-12-18 VITALS — BP 138/82 | HR 79 | Temp 97.3°F | Ht 71.0 in | Wt 158.0 lb

## 2023-12-18 DIAGNOSIS — B4481 Allergic bronchopulmonary aspergillosis: Secondary | ICD-10-CM

## 2023-12-18 DIAGNOSIS — J4550 Severe persistent asthma, uncomplicated: Secondary | ICD-10-CM

## 2023-12-18 DIAGNOSIS — F439 Reaction to severe stress, unspecified: Secondary | ICD-10-CM

## 2023-12-18 DIAGNOSIS — J3089 Other allergic rhinitis: Secondary | ICD-10-CM

## 2023-12-18 LAB — NITRIC OXIDE: Nitric Oxide: 38

## 2023-12-18 MED ORDER — TRELEGY ELLIPTA 200-62.5-25 MCG/ACT IN AEPB: 1.0000 | INHALATION_SPRAY | Freq: Every day | RESPIRATORY_TRACT | 0 refills | Status: AC

## 2023-12-18 NOTE — Patient Instructions (Signed)
 VISIT SUMMARY:  You came in today for a follow-up visit regarding your severe persistent asthma. We discussed your current condition and the impact of recent personal stressors on your health.  YOUR PLAN:  -SEVERE PERSISTENT ASTHMA: Severe persistent asthma is a long-term condition where the airways in your lungs are always inflamed and can become narrowed, making it hard to breathe. Your asthma is currently well-controlled despite the significant stress you are experiencing. We will provide you with samples of Trelegy to cover the next six weeks. We will also look into the status of your current medication program and consider alternative options if needed.  -STRESS MANAGEMENT: You are experiencing significant stress due to recent family events, including the passing of your uncle and your mom's severe illness. It is important to manage this stress to maintain your overall health. Please consider seeking support from friends, family, or a mental health professional if needed.  INSTRUCTIONS:  Please schedule a follow-up appointment in three months unless you experience any issues before then.

## 2023-12-18 NOTE — Progress Notes (Unsigned)
 Subjective:    Patient ID: Shannon Knapp, male    DOB: 11-01-1985, 38 y.o.   MRN: [id number]  Patient Care Team: [facility], [facility] as PCP - General (General Practice) Tamea Dedra CROME, MD as Consulting Physician (Pulmonary Disease)  Chief Complaint  Patient presents with   Follow-up    No SOB, wheezing or cough.    BACKGROUND/INTERVAL:38 year old male, former smoker followed for severe persistent asthma, eosinophilic phenotype, steroid-dependent, ABPA, allergic rhinitis.  He was last seen by me on 16 Oct 2023.  Additional past medical history significant for GERD. He has been on Dupixent  and asthma controller medications.  He presents today for follow-up visit.   HPI Discussed the use of AI scribe software for clinical note transcription with the patient, who gave verbal consent to proceed.  History of Present Illness   Shannon Knapp is a 38 year old male with severe persistent asthma who presents for follow-up.  He has severe persistent asthma but his symptoms have been better controlled lately.  He is on Trelegy Ellipta  201 puff daily, albuterol  as needed which he has hardly required.  And Dupixent  every 2 weeks.  He is on prednisone  10 mg daily.  He has had difficulties getting the Trelegy Ellipta  and we are assisting him in this endeavor.  He is experiencing significant stress due to recent family events.  His mother has been gravely ill at the [facility], ICU.  His uncle passed away, and his child was involved in a severe accident. These events have been emotionally taxing, and he is striving to maintain stability for himself and his child.  Despite the stressors he has adhered to his asthma regimen and feels that he is coping as best as he can.  He has not had any wheezing, no shortness of breath, no cough.  No chest pain.     Review of Systems A 10 point review of systems was performed and it is as noted above otherwise negative.   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/07/2020   Community acquired pneumonia of left lower lobe of lung 10/25/2019   Medication management 04/30/2019   Healthcare maintenance 11/26/2018   Severe persistent asthma 05/28/2018   Acute febrile illness 04/10/2018   Upper airway cough syndrome    Hypoxemia    Syncope    Allergic rhinitis    Cough    Hemoptysis    Protein-calorie malnutrition, severe 10/20/2017   Acute respiratory failure with hypoxemia (HCC) 10/20/2017   Acute respiratory distress    Asthma exacerbation    Status asthmaticus 10/18/2017   Severe persistent acute asthmatic bronchitis 10/17/2017   Elevated blood pressure reading without diagnosis of hypertension 10/17/2017   Respiratory distress 10/17/2017    Social History   Tobacco Use   Smoking status: Former    Current packs/day: 0.00    Average packs/day: 1 pack/day for 14.2 years (14.2 ttl pk-yrs)    Types: Cigarettes    Start date: 2006    Quit date:  08/2018    Years since quitting: 5.3   Smokeless tobacco: Never  Substance Use Topics   Alcohol use: Not Currently    Alcohol/week: 15.0 standard drinks of alcohol    Types: 15 Cans of beer per week    Allergies  Allergen Reactions   Amoxicillin Hives    Hives all over body. 2009-[facility] in [place], [place]. Has patient had a PCN reaction causing immediate rash, facial/tongue/throat swelling, SOB or lightheadedness with hypotension: Yes Has patient had a PCN  reaction causing severe rash involving mucus membranes or skin necrosis: No Has patient had a PCN reaction that required hospitalization: No Has patient had a PCN reaction occurring within the last 10 years: Yes If all of the above answers are NO, then may proceed with Cephalosporin use.   Omalizumab  Anaphylaxis    Unclear if this was due to omalizumab  as the patient had COVID-19 at the time.  Complaint was that of sore throat, tight throat.   Doxycycline     Fish Allergy Swelling   Shellfish Allergy Swelling   Penicillins Hives    Has patient had a PCN reaction causing immediate rash, facial/tongue/throat swelling, SOB or lightheadedness with hypotension: Yes Has patient had a PCN reaction causing severe rash involving mucus membranes or skin necrosis: No Has patient had a PCN reaction that required hospitalization: No Has patient had a PCN reaction occurring within the last 10 years: Yes If all of the above answers are NO, then may proceed with Cephalosporin use.    Current Meds  Medication Sig   albuterol  (PROVENTIL ) (2.5 MG/3ML) 0.083% nebulizer solution Take 3 mLs (2.5 mg total) by nebulization every 4 (four) hours as needed for wheezing or shortness of breath.   albuterol  (VENTOLIN  HFA) 108 (90 Base) MCG/ACT inhaler Inhale 1-2 puffs into the lungs every 6 (six) hours as needed for shortness of breath.   Dupilumab  (DUPIXENT ) 300 MG/2ML SOAJ Inject 300 mg into the skin every 14 (fourteen) days.   EPINEPHrine  (EPIPEN  2-PAK) 0.3 mg/0.3 mL IJ SOAJ injection Inject 0.3mg  into the muscle as needed for anaphylaxis   Fluticasone -Umeclidin-Vilant (TRELEGY ELLIPTA ) 200-62.5-25 MCG/ACT AEPB Inhale 1 puff into the lungs daily.   Fluticasone -Umeclidin-Vilant (TRELEGY ELLIPTA ) 200-62.5-25 MCG/ACT AEPB Inhale 1 puff into the lungs daily.   loratadine  (CLARITIN ) 10 MG tablet Take 1 tablet (10 mg total) by mouth daily.   montelukast  (SINGULAIR ) 10 MG tablet Take 1 tablet (10 mg total) by mouth  daily.   Nebulizers (COMPRESSOR/NEBULIZER) MISC 1 Dose by Does not apply route daily.   predniSONE  (DELTASONE ) 10 MG tablet Take 2 tablets (20 mg total) by mouth daily with breakfast. Or as directed (Patient taking differently: Take 10 mg by mouth daily with breakfast. Or as directed)    Immunization History  Administered Date(s) Administered   Moderna Sars-Covid-2 Vaccination 03/24/2020, 05/01/2020   Pneumococcal Polysaccharide-23 10/19/2017        Objective:     BP 138/82 (BP Location: Right Arm, Cuff Size: Normal)   Pulse 79   Temp (!) 97.3 F (36.3 C)   SpO2 99%   SpO2: 99 % O2 Device: None (Room air)  GENERAL: Well-developed, well-nourished gentleman in no acute distress.  No conversational dyspnea.  Looks fit.  Fully ambulatory. HEAD: Normocephalic, atraumatic.  EYES: Pupils equal, round, reactive to light.  No scleral icterus.  MOUTH: Poor dentition, oral mucosa moist.  No thrush. NECK: Supple. No thyromegaly. Trachea midline. No JVD.  No adenopathy. PULMONARY: Symmetrical air entry, no adventitious sounds.  No prolonged expiratory phase.  CARDIOVASCULAR: S1 and S2.  Regular rate and rhythm.  No rubs, murmurs or gallops heard. ABDOMEN: Benign. MUSCULOSKELETAL: No joint deformity, no clubbing, no edema.  NEUROLOGIC: No overt focal deficit. SKIN: Intact,warm,dry. PSYCH: Mood and behavior normal.  Lab Results  Component Value Date   NITRICOXIDE 38 12/18/2023  *Trend:191>>32>>27>>65>>38 ppb *Evidence of moderate type II inflammation present however, numbers continue to improve.  Assessment & Plan:     ICD-10-CM   1. Severe persistent asthma without complication  J45.50 Nitric oxide     2. Perennial allergic rhinitis  J30.89     3. ABPA (allergic bronchopulmonary aspergillosis) (HCC)  B44.81    Treated with itraconazole /prednisone  No recurrent symptoms    4. Situational stress  F43.9      Orders Placed This Encounter  Procedures   Nitric oxide    Meds  ordered this encounter  Medications   Fluticasone -Umeclidin-Vilant (TRELEGY ELLIPTA ) 200-62.5-25 MCG/ACT AEPB    Sig: Inhale 1 puff into the lungs daily.    Dispense:  3 each    Refill:  0    Lot Number?:   [id number]    Expiration Date?:   12/18/2024    Quantity:   3   Discussion:    Severe persistent asthma Severe persistent asthma with controlled airway inflammation despite significant personal stressors. Airway sounds are clear, indicating effective management. - Provide samples of Trelegy to cover six weeks. - Investigate current medication program status and consider alternative enrollment if necessary. - Schedule follow-up appointment in three months unless issues arise.  - Continue prednisone  at 10 mg daily for now given nitric oxide  levels. - Continue Trelegy 200, albuterol  as needed, and Dupixent .     Advised if symptoms do not improve or worsen, to please contact office for sooner follow up or seek emergency care.    I spent 40 minutes of dedicated to the care of this patient on the date of this encounter to include pre-visit review of records, face-to-face time with the patient discussing conditions above, post visit ordering of testing, clinical documentation with the electronic health record, making appropriate referrals as documented, and communicating necessary findings to members of the patients care team.     C. Leita Sanders, MD Advanced Bronchoscopy PCCM [facility]    *This note was generated using voice recognition software/Dragon and/or AI transcription program.  Despite best efforts to proofread, errors can occur which can change the meaning. Any transcriptional errors that result from this process are unintentional and may not be fully corrected at the time of dictation.

## 2023-12-19 ENCOUNTER — Encounter: Payer: Self-pay | Admitting: Pulmonary Disease

## 2024-01-05 ENCOUNTER — Other Ambulatory Visit: Payer: Self-pay

## 2024-01-06 ENCOUNTER — Other Ambulatory Visit: Payer: Self-pay

## 2024-01-09 ENCOUNTER — Other Ambulatory Visit: Payer: Self-pay

## 2024-01-09 ENCOUNTER — Telehealth: Payer: Self-pay

## 2024-01-09 MED ORDER — AIRSUPRA 90-80 MCG/ACT IN AERO
2.0000 | INHALATION_SPRAY | RESPIRATORY_TRACT | 6 refills | Status: AC | PRN
  Filled 2024-01-09 – 2024-02-11 (×2): qty 10.7, 30d supply, fill #0

## 2024-01-09 NOTE — Telephone Encounter (Signed)
 I have notified the patient that he will have to get the albuterol  if he needs something until the pharmacy can get the Airsupra  patient assistance set up. He is aware that we have sent [facility] pharmacy a script for the Airspura.  Nothing further needed.

## 2024-01-09 NOTE — Telephone Encounter (Signed)
 Yes, script for AirSupra  would be excellent.

## 2024-01-09 NOTE — Telephone Encounter (Signed)
 Prescription sent to [facility] pharmacy.

## 2024-01-09 NOTE — Telephone Encounter (Addendum)
 I spoke with the patient. He said the Albuterol  is $30 and he can not afford that. He is needing something else.  I called [facility] pharmacy. They said he may qualify for the AirSupra  patient assistance but that may take a week or 2 to get approved. She said if he needs something now he will need to pay for the albuterol  and then we can send in a script for the Airsupra .  Dr. Tamea please advise if you are okay sending in a script for Airsupra ?

## 2024-01-09 NOTE — Telephone Encounter (Signed)
 Copied from CRM #[id number]. Topic: Clinical - Medication Question >> Jan 09, 2024 12:59 PM Rozanna MATSU wrote: Reason for CRM: PT CALLING STATED HE CAN NOT AFFORD THE Rx #: [id number]  albuterol  (VENTOLIN  HFA) 108 (90 Base) MCG/ACT inhaler  AND WOULD LIKE TO SEE IF THERE ARE ANY SAMPLES AT THE CLINIC HE CAN USE. PLEASE REACH BACK PLEASE. HE STATED HE WILL GO TO ANOTHER LOCATION TO GET IT IF THEY HAVE IT

## 2024-02-11 ENCOUNTER — Other Ambulatory Visit: Payer: Self-pay

## 2024-02-26 NOTE — Progress Notes (Signed)
 This encounter was created in error - please disregard.

## 2024-03-08 ENCOUNTER — Other Ambulatory Visit: Payer: Self-pay | Admitting: Pulmonary Disease

## 2024-03-08 ENCOUNTER — Other Ambulatory Visit: Payer: Self-pay

## 2024-03-08 DIAGNOSIS — J3089 Other allergic rhinitis: Secondary | ICD-10-CM

## 2024-03-08 MED ORDER — PREDNISONE 10 MG PO TABS
20.0000 mg | ORAL_TABLET | Freq: Every day | ORAL | 2 refills | Status: DC
  Filled 2024-03-08: qty 60, 30d supply, fill #0
  Filled 2024-04-05: qty 60, 30d supply, fill #1
  Filled 2024-05-06: qty 60, 30d supply, fill #2

## 2024-03-29 ENCOUNTER — Encounter: Payer: Self-pay | Admitting: Pulmonary Disease

## 2024-03-29 ENCOUNTER — Ambulatory Visit (INDEPENDENT_AMBULATORY_CARE_PROVIDER_SITE_OTHER): Payer: Self-pay | Admitting: Pulmonary Disease

## 2024-03-29 VITALS — BP 126/76 | HR 93 | Temp 97.6°F | Ht 71.0 in | Wt 164.8 lb

## 2024-03-29 DIAGNOSIS — J4550 Severe persistent asthma, uncomplicated: Secondary | ICD-10-CM

## 2024-03-29 DIAGNOSIS — Z7952 Long term (current) use of systemic steroids: Secondary | ICD-10-CM

## 2024-03-29 DIAGNOSIS — J3089 Other allergic rhinitis: Secondary | ICD-10-CM

## 2024-03-29 DIAGNOSIS — R0602 Shortness of breath: Secondary | ICD-10-CM

## 2024-03-29 LAB — NITRIC OXIDE: Nitric Oxide: 26

## 2024-03-29 NOTE — Patient Instructions (Signed)
 VISIT SUMMARY:  You came in today for a follow-up on your asthma management. Your asthma is well controlled, and you have been diligent in maintaining your inhalers and receiving regular allergy shots. Your nitric oxide  level has significantly improved, indicating good control of your asthma.  YOUR PLAN:  -ASTHMA, UNCOMPLICATED: Asthma is a condition where your airways narrow and swell, producing extra mucus, which can make breathing difficult. Your asthma is well-controlled with your current treatment. You should continue using your Trelegy inhaler daily and use the Air Supra inhaler as a rescue inhaler when needed. Remember to rinse your inhaler weekly to prevent clogging. We will schedule a follow-up appointment in four months.  INSTRUCTIONS:  We will schedule a follow-up appointment in four months to monitor your asthma management and make any necessary adjustments.

## 2024-03-29 NOTE — Progress Notes (Unsigned)
 Subjective:    Patient ID: Shannon Knapp, male    DOB: 1986-01-25, 38 y.o.   MRN: [id number]  Patient Care Team: [facility], [facility] as PCP - General (General Practice) Tamea Dedra CROME, MD as Consulting Physician (Pulmonary Disease)  Chief Complaint  Patient presents with   Asthma    BACKGROUND/INTERVAL:38 year old male, former smoker followed for severe persistent asthma, eosinophilic phenotype, steroid-dependent, ABPA, allergic rhinitis.  He was last seen by me on 18 December 2023.  Additional past medical history significant for GERD. He has been on Dupixent  and asthma controller medications (Trelegy and albuterol ).  He presents today for follow-up visit.   HPI Discussed the use of AI scribe software for clinical note transcription with the patient, who gave verbal consent to proceed.  History of Present Illness   Shannon Knapp is a 38 year old male with asthma who presents for follow-up of his asthma management.  His asthma is well controlled, with occasional morning cough attributed to running out of his inhaler, Trelegy. He uses Trelegy regularly and continues to receive regular Dupixent  injections. He is awaiting a new order for Dupixent , as he mistakenly thought he had one more dose available.  He was unsure whether he should use Trelegy and Airsupra  together.  It was explained to him that he was placed on Airsupra  as that was going to be no cost to him whereas the albuterol  would have been $30 which he could not afford.  He can use the Airsupra  as needed for rescue.  He should continue taking Trelegy 200.  He is still on prednisone  10 mg daily.  He is diligent in maintaining his inhalers, including cleaning them regularly to prevent clogging. His nitric oxide  level has decreased from 190 to 26.     He declines flu vaccine.  Review of Systems A 10 point review of systems was performed and it is as noted above otherwise negative.   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/2020   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/07/2020   Community acquired pneumonia of left lower lobe of lung 10/25/2019   Medication management 04/30/2019   Healthcare maintenance 11/26/2018   Severe persistent asthma (HCC) 05/28/2018   Acute febrile illness 04/10/2018   Upper airway cough syndrome    Hypoxemia    Syncope    Allergic rhinitis    Cough    Hemoptysis    Protein-calorie malnutrition, severe 10/20/2017   Acute respiratory failure with hypoxemia (HCC) 10/20/2017   Acute respiratory distress    Asthma exacerbation    Status asthmaticus 10/18/2017   Severe persistent acute asthmatic bronchitis (HCC) 10/17/2017   Elevated blood pressure reading without diagnosis of hypertension 10/17/2017   Respiratory distress 10/17/2017    Social History   Tobacco Use   Smoking status: Former    Current packs/day: 0.00    Average packs/day: 1 pack/day for 14.2 years (14.2 ttl pk-yrs)    Types: Cigarettes    Start date: 2006    Quit date: 08/2018    Years since quitting:  5.6   Smokeless tobacco: Never  Substance Use Topics   Alcohol use: Not Currently    Alcohol/week: 15.0 standard drinks of alcohol    Types: 15 Cans of beer per week    Allergies  Allergen Reactions   Amoxicillin Hives    Hives all over body. 2009-[facility] in [place], [place]. Has patient had a PCN reaction causing immediate rash, facial/tongue/throat swelling, SOB or lightheadedness with hypotension: Yes Has  patient had a PCN reaction causing severe rash involving mucus membranes or skin necrosis: No Has patient had a PCN reaction that required hospitalization: No Has patient had a PCN reaction occurring within the last 10 years: Yes If all of the above answers are NO, then may proceed with Cephalosporin use.   Omalizumab  Anaphylaxis    Unclear if this was due to omalizumab  as the patient had COVID-19 at the time.  Complaint was that of sore throat, tight throat.   Doxycycline     Fish Allergy Swelling   Shellfish Allergy Swelling   Penicillins Hives    Has patient had a PCN reaction causing immediate rash, facial/tongue/throat swelling, SOB or lightheadedness with hypotension: Yes Has patient had a PCN reaction causing severe rash involving mucus membranes or skin necrosis: No Has patient had a PCN reaction that required hospitalization: No Has patient had a PCN reaction occurring within the last 10 years: Yes If all of the above answers are NO, then may proceed with Cephalosporin use.    Current Meds  Medication Sig   albuterol  (VENTOLIN  HFA) 108 (90 Base) MCG/ACT inhaler Inhale 1-2 puffs into the lungs every 6 (six) hours as needed for shortness of breath.   Dupilumab  (DUPIXENT ) 300 MG/2ML SOAJ Inject 300 mg into the skin every 14 (fourteen) days.   EPINEPHrine  (EPIPEN  2-PAK) 0.3 mg/0.3 mL IJ SOAJ injection Inject 0.3mg  into the muscle as needed for anaphylaxis   montelukast  (SINGULAIR ) 10 MG tablet Take 1 tablet (10 mg total) by mouth daily.   predniSONE  (DELTASONE ) 10 MG tablet Take 2 tablets (20 mg total) by mouth daily with breakfast. Or as directed    Immunization History  Administered Date(s) Administered   Moderna Sars-Covid-2 Vaccination 03/24/2020, 05/01/2020   Pneumococcal Polysaccharide-23 10/19/2017        Objective:     BP 126/76   Pulse 93   Temp 97.6 F (36.4 C) (Temporal)   Ht 5' 11 (1.803 m)   Wt 164 lb 12.8 oz (74.8 kg)   SpO2 99%   BMI 22.98 kg/m    SpO2: 99 %  GENERAL: Well-developed, well-nourished gentleman in no acute distress.  No conversational dyspnea.  Looks fit.  Fully ambulatory. HEAD: Normocephalic, atraumatic.  EYES: Pupils equal, round, reactive to light.  No scleral icterus.  MOUTH: Poor dentition, oral mucosa moist.  No thrush. NECK: Supple. No thyromegaly. Trachea midline. No JVD.  No adenopathy. PULMONARY: Symmetrical air entry, no adventitious sounds.  No prolonged expiratory phase.  CARDIOVASCULAR: S1 and S2.  Regular rate and rhythm.  No rubs, murmurs or gallops heard. ABDOMEN: Benign. MUSCULOSKELETAL: No joint deformity, no clubbing, no edema.  NEUROLOGIC: No overt focal deficit. SKIN: Intact,warm,dry. PSYCH: Mood and behavior normal.  Lab Results  Component Value Date   NITRICOXIDE 26 03/29/2024  *This result suggests low Type II (T2) airway inflammation indicating a low likelihood of active T2-driven airway inflammation.  In a patient with active T2 driven asthma management it suggests good control. *Trend:191>>32 >>27 >>65 >>38 >> 26 ppb.  Assessment & Plan:     ICD-10-CM   1. Severe persistent asthma dependent on systemic steroids (HCC)  J45.50 Nitric oxide    Z79.52     2. Perennial allergic rhinitis  J30.89       Orders Placed This Encounter  Procedures   Nitric oxide    Discussion:    Asthma, uncomplicated Asthma is well-controlled with current treatment regimen. Occasional morning cough likely due to running out of inhaler. Nitric oxide  level is 26 parts per billion, improved from previous 38, indicating good control. Air Supra inhaler, containing albuterol  and a steroid, is recommended as a rescue inhaler due to its cost-effectiveness and additional steroid component, potentially avoiding the need for a steroid burst. - Continue Trelegy inhaler daily. - Use Air Supra inhaler as a rescue inhaler. - Rinse inhaler weekly to prevent clogging. - Continue Dupixent  therapy. - Continue  prednisone  as is, will consider decrease dose on follow-up visit if nitric oxide  remains low. - Will schedule follow-up in four months.       Advised if symptoms do not improve or worsen, to please contact office for sooner follow up or seek emergency care.    I spent 30 minutes of dedicated to the care of this patient on the date of this encounter to include pre-visit review of records, face-to-face time with the patient discussing conditions above, post visit ordering of testing, clinical documentation with the electronic health record, making appropriate referrals as documented, and communicating necessary findings to members of the patients care team.     C. Leita Sanders, MD Advanced Bronchoscopy P[facility]    *This note was generated using voice recognition software/Dragon and/or AI transcription program.  Despite best efforts to proofread, errors can occur which can change the meaning. Any transcriptional errors that result from this process are unintentional and may not be fully corrected at the time of dictation.

## 2024-04-05 ENCOUNTER — Telehealth: Payer: Self-pay

## 2024-04-05 ENCOUNTER — Other Ambulatory Visit: Payer: Self-pay

## 2024-04-05 MED ORDER — TRELEGY ELLIPTA 200-62.5-25 MCG/ACT IN AEPB: 1.0000 | INHALATION_SPRAY | Freq: Every day | RESPIRATORY_TRACT | 0 refills | Status: AC

## 2024-04-05 NOTE — Telephone Encounter (Signed)
 I have placed a sample of Trelegy at the front desk and notified the patient.   Nothing further needed.

## 2024-04-05 NOTE — Telephone Encounter (Signed)
 Copied from CRM #[id number]. Topic: Clinical - Prescription Issue >> Apr 05, 2024 11:30 AM Corean SAUNDERS wrote: Reason for CRM: Patient is inquiring about possible samples of Fluticasone -Umeclidin-Vilant (TRELEGY ELLIPTA ) 200-62.5-25 MCG/ACT AEPB  As he can't afford his copay today.    Please call patient back at [phone] (H)

## 2024-05-06 ENCOUNTER — Other Ambulatory Visit: Payer: Self-pay

## 2024-05-12 ENCOUNTER — Other Ambulatory Visit: Payer: Self-pay

## 2024-05-17 ENCOUNTER — Ambulatory Visit: Payer: Self-pay

## 2024-05-17 DIAGNOSIS — Z113 Encounter for screening for infections with a predominantly sexual mode of transmission: Secondary | ICD-10-CM

## 2024-05-17 DIAGNOSIS — Z202 Contact with and (suspected) exposure to infections with a predominantly sexual mode of transmission: Secondary | ICD-10-CM

## 2024-05-17 LAB — HM HIV SCREENING LAB: HM HIV Screening: NEGATIVE

## 2024-05-17 MED ORDER — AZITHROMYCIN 500 MG PO TABS: 1000.0000 mg | ORAL_TABLET | Freq: Every day | ORAL | Status: DC

## 2024-05-17 MED ORDER — AZITHROMYCIN 500 MG PO TABS
1000.0000 mg | ORAL_TABLET | Freq: Once | ORAL | Status: AC
  Administered 2024-05-17: 1000 mg via ORAL

## 2024-05-17 NOTE — Progress Notes (Signed)
 " [facility] Department STI clinic [address] [place] [address] Main phone: [phone]  STI screening visit  Subjective:  Shannon Knapp is a 38 y.o. male being seen today for an STI screening visit. The patient reports they do not have symptoms.    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/07/2020   Community acquired pneumonia of left lower lobe of lung 10/25/2019   Medication management 04/30/2019   Healthcare maintenance 11/26/2018   Severe persistent asthma (HCC) 05/28/2018   Acute febrile illness 04/10/2018   Upper airway cough syndrome    Hypoxemia    Syncope    Allergic rhinitis    Cough    Hemoptysis    Protein-calorie malnutrition, severe 10/20/2017   Acute respiratory failure with hypoxemia (HCC) 10/20/2017   Acute respiratory distress    Asthma exacerbation    Status asthmaticus 10/18/2017   Severe persistent acute asthmatic bronchitis (HCC) 10/17/2017   Elevated blood pressure reading without diagnosis of hypertension 10/17/2017   Respiratory distress 10/17/2017   Chief Complaint  Patient presents with   SEXUALLY TRANSMITTED DISEASE    HPI Patient reports fiance back has been hurting for a few months, had blood work done and came back that she had chlamydia and received treatment, today is her last day of antibiotics. Has not had sexual intercourse since  fiance has been on antibiotics. Reports he was diagnosed with COPD and they couldn't figure out what he was allergic too, and that at that time they gave him a lot of antibiotics and doxycyline and amoxicillin gave him hives, but didn't cause his throat to swell. He reports seeing a pulmonologist and has asthma well-controlled on medication.   Reproductive considerations: Does the patient or their partner desires a pregnancy in the next year? No  See flowsheet for further details and programmatic requirements  Hyperlink available at the top of the signed note in blue.  Flow sheet content below:  Pregnancy Intention Screening Does the patient want to become pregnant in the next year?: N/A Does the patient's partner want to become pregnant in the next year?: No Would the patient like to discuss contraceptive options today?: N/A All Patients Anyone smoke around pt and/or pt's children?: No Anyone smoke inside pt's house?: No Anyone smoke inside car?: No Anyone smoke inside the workplace?: No Reason For STD Screen STD Screening: Is asymptomatic Have you ever had an STD?: No History of Antibiotic use in the past 2 weeks?: No STD Symptoms Denies all: Yes Risk Factors for Hep B Household, sexual, or needle sharing contact of a person infected with Hep B: No Sexual contact with a person who uses drugs not as prescribed?: No Currently or Ever used drugs not as prescribed: No HIV Positive: No PRep Patient: No Men who have sex with men: No Have Hepatitis C: No History of Incarceration: No History of Homeslessness?: No Anal sex following anal drug use?: No Risk Factors for Hep C Currently  using drugs not as prescribed: No Sexual partner(s) currently using drugs as not prescribed: No History of drug use: No HIV Positive: No People with a history of incarceration: No People born between the years of 32 and 29: No Advise Advised client to quit or stay quit. :  (pt smoked cigarettes one  time recently decided to quit) Abuse History Has patient ever been abused physically?: No Has patient ever been abused sexually?: No Does patient feel they have a problem with Anxiety?: No Does patient feel they have a problem with Depression?: No Referral to Behavioral Health: No Counseling Patient counseled to use condoms with all sex: Condoms declined RTC in 2-3 weeks for test results: Yes Clinic will call if test results abnormal before test result appt.: Yes Immunizations: Immunization history assessed, Referred Test results given to patient Patient counseled to use condoms with all sex: Condoms declined  Screening for MPX risk:  Unexplained rash?  No   MSM?  No   Multiple or anonymous sex partners?  No   Any close or sexual contact with a person  diagnosed with MPX?  No   Any outside the [place]  where MPX is endemic?  No   High clinical suspicion for MPX?    -Unlikely to be chickenpox    -Lymphadenopathy    -Rash that presents in same phase of       evolution on any given body part  No   Does this patient meet CDC recommendations for vaccination against MPOX? No  You already have or anticipate having the following risks:  Your sex partner has the following risks: You're traveling to a county with a clade I MPOX outbreak and anticipate these risks: Occupational exposure  You had known or suspected exposure to someone with monkeypox You had a sex partner in the past 2 weeks who was diagnosed with monkeypox You are a gay, bisexual, or other man who has sex with men, or are transgender or nonbinary and in the past 6 months have had any of the following: - A new diagnosis of one or more sexually transmitted diseases (e.g., chlamydia, gonorrhea, or syphilis) - More than one sex partner You have had any of the following in the past 6 months: - Sex at a commercial sex venue (like a sex club or bathhouse) - Sex related to a large commercial event   or in a geographic area (city or  county for example) where mpox virus transmission is occurring Sex with a new partner Sex at a commercial sex venue (e.g., a sex club or bathhouse) Sex in it consultant for money, goods, drugs, or other trade Sex in association with a large public event (e.g., a rave, party, or festival) i.e. certain people who work in a laboratory or healthcare facility   Infectious disease screenings: Vaccinated against HPV? Unknown  HIV Ever had a positive? No Last test: - Per pt report 2005-2008. Results in chart:  No results found for: HMHIVSCREEN  Lab Results  Component Value Date   HIV Non Reactive 03/30/2020     Hep B Hep B status: unknown or no prior testing Received HBV vaccination? Unknown Received HBV testing for immunity? Unknown Results in chart:  No components found for: HMHEPBSCREEN  Do they qualify for HBV screening today? No Criteria:  -Household, sexual or needle sharing contact with HBV -History of drug use or homelessness -HIV positive -Those with known Hep C  Hep C Hep C status: unknown or no prior testing Results in chart:  No results found for: HMHEPCSCREEN No components found for: HEPC  Do they qualify for HCV screening today? No Criteria - since the last HCV result, does the patient have any of the following? - Current drug use - Have a partner with drug use - Has been incarcerated  Immunization history:  Immunization History  Administered Date(s) Administered   Moderna Sars-Covid-2 Vaccination 03/24/2020, 05/01/2020   Pneumococcal Polysaccharide-23 10/19/2017    The following portions of the patient's history were reviewed and updated as appropriate: allergies, current medications, past medical history, past social history, past surgical history and problem list.  Substance use screenings:  Uses tobacco products? Yes Uses vapes? No Uses alcohol? No Uses non-injectable substances that alter your mental status? No Uses non-prescribed injectable  substances? No  Immunization History  Administered Date(s) Administered   Moderna Sars-Covid-2 Vaccination 03/24/2020, 05/01/2020   Pneumococcal Polysaccharide-23 10/19/2017    The following portions of the patient's history were reviewed and updated as appropriate: allergies, current medications, past medical history, past social history, past surgical history and problem list.  Objective:  There were no vitals filed for this visit.  Physical Exam Vitals and nursing note reviewed.  Constitutional:      Appearance: Normal appearance.  HENT:     Head: Normocephalic and atraumatic.     Comments: No nits or hair loss of scalp, brows, and lashes    Mouth/Throat:     Mouth: Mucous membranes are moist.     Pharynx: Oropharynx is clear. No oropharyngeal exudate or posterior oropharyngeal erythema.  Eyes:     General:        Right eye: No discharge.        Left eye: No discharge.     Conjunctiva/sclera: Conjunctivae normal.     Right eye: Right conjunctiva is not injected. No exudate.    Left eye: Left conjunctiva is not injected. No exudate. Pulmonary:     Effort: Pulmonary effort is normal.  Abdominal:     Palpations: There is no hepatomegaly.     Tenderness: There is no abdominal tenderness. There is no rebound.  Genitourinary:    Comments: Politely declined genital exam. Lymphadenopathy:     Cervical: No cervical adenopathy.     Upper Body:     Right upper body: No supraclavicular, axillary or epitrochlear adenopathy.     Left upper body: No supraclavicular, axillary or epitrochlear adenopathy.     Comments: Patient declines genital exam. Inguinal lymph nodes not assessed.   Skin:    General: Skin is warm and dry.     Findings: No lesion or rash.  Neurological:     Mental Status: He is alert and oriented to person, place, and time.     Assessment and Plan:  Shannon Knapp is a 38 y.o. male presenting to the [facility] Department for STI screening.   Patient accepted the following screenings: oral GC culture, urine CT/GC, HIV, and RPR  1. Screening for venereal disease (Primary) -Will notify pt accordingly of results.  - Syphilis Serology, [facility] - HIV [place] - Chlamydia/GC NAA, Confirmation - Gonococcus culture  2. Chlamydia contact -Reviewed Azithromycin  allergic reaction precautions including anaphylaxis and angioedema. Advised to seek Emergency care services if rash, swelling or difficulty breathing occur, pt expressed understanding.  - azithromycin  (ZITHROMAX ) tablet 1,000 mg   The patient was dispensed Azithromycin  1,000 mg  today. I provided counseling today regarding the medication. We discussed the medication, the side effects and when  to call clinic. Patient given the opportunity to ask questions. Questions answered.     Counseling: Recommended condom use with all sex Discussed importance of condom use for STI prevention Discussed time line for [facility] results and that patient will be called with positive results and encouraged patient to call if they had not heard in 2 weeks Recommended repeat testing in 3 months with positive results. Recommended returning for continued or worsening symptoms.   Return for STI screening, PRN.  Future Appointments  Date Time Provider Department Center  07/27/2024  4:00 PM Tamea Dedra CROME, MD [id number]    Hardin GORMAN Pouch, NP "

## 2024-05-19 ENCOUNTER — Telehealth: Payer: Self-pay | Admitting: Family Medicine

## 2024-05-19 NOTE — Telephone Encounter (Signed)
 See note

## 2024-05-21 ENCOUNTER — Ambulatory Visit: Payer: Self-pay

## 2024-05-21 LAB — CHLAMYDIA/GC NAA, CONFIRMATION
Chlamydia trachomatis, NAA: POSITIVE — AB
Neisseria gonorrhoeae, NAA: NEGATIVE

## 2024-05-21 LAB — C. TRACHOMATIS NAA, CONFIRM: C. trachomatis NAA, Confirm: POSITIVE — AB

## 2024-05-21 NOTE — Progress Notes (Signed)
 Attempted to contact pt regarding positive lab results for chlamydia from12/29/25.Pt was treated with azithromycin  on 05/17/24. No voicemail setup could not leave msg.

## 2024-05-22 LAB — GONOCOCCUS CULTURE

## 2024-05-23 NOTE — Progress Notes (Signed)
Chart reviewed by Pharmacist  Suzanne Walker PharmD, Contract Pharmacist at [facility]  

## 2024-05-24 NOTE — Telephone Encounter (Signed)
 Pt returned call. Verified pt identity. Discussed with pt his chlamydia /gonorrhea test results from 05/17/24 Chlamydia positive and Gonorrhea negative. Pt was treated on 05/17/24 with Azithromycin . Per pt his partner was treated prior to him . Advised to return in 3months for TOC. Pt verbalizes understanding.

## 2024-06-02 ENCOUNTER — Other Ambulatory Visit: Payer: Self-pay | Admitting: Pulmonary Disease

## 2024-06-02 ENCOUNTER — Other Ambulatory Visit: Payer: Self-pay

## 2024-06-02 DIAGNOSIS — J3089 Other allergic rhinitis: Secondary | ICD-10-CM

## 2024-06-02 MED ORDER — PREDNISONE 10 MG PO TABS
20.0000 mg | ORAL_TABLET | Freq: Every day | ORAL | 2 refills | Status: AC
  Filled 2024-06-02: qty 60, 30d supply, fill #0

## 2024-06-24 ENCOUNTER — Telehealth: Payer: Self-pay

## 2024-06-24 NOTE — Telephone Encounter (Signed)
 Source  Shannon Knapp (Patient)   Subject  Shannon Knapp (Patient)   Topic  Clinical - Medication Refill    Communication  Medication: Dupilumab  (DUPIXENT ) 300 MG/2ML SOAJ        Has the patient contacted their pharmacy? Yes - Pharmacy Calling        This is the patient's preferred pharmacy:    [facility] [place] [place] - [address]    [address]    [place] [address]    Phone: [phone] Fax: [phone]        Is this the correct pharmacy for this prescription? Yes    If no, delete pharmacy and type the correct one.        Has the prescription been filled recently? No        Is the patient out of the medication? Yes        Has the patient been seen for an appointment in the last year OR does the patient have an upcoming appointment? Yes        Can we respond through MyChart? Yes      Agent: Please be advised that Rx refills may take up to 3 business days. We ask that you follow-up with your pharmacy.

## 2024-07-27 ENCOUNTER — Ambulatory Visit: Payer: Self-pay | Admitting: Pulmonary Disease
